# Patient Record
Sex: Female | Born: 1935 | Race: White | Hispanic: No
Health system: Southern US, Community
[De-identification: ages and names within clinical notes are randomized; demographics above are authoritative.]

## PROBLEM LIST (undated history)

## (undated) DIAGNOSIS — I639 Cerebral infarction, unspecified: Secondary | ICD-10-CM

## (undated) DIAGNOSIS — I2510 Atherosclerotic heart disease of native coronary artery without angina pectoris: Secondary | ICD-10-CM

## (undated) DIAGNOSIS — Z951 Presence of aortocoronary bypass graft: Secondary | ICD-10-CM

## (undated) DIAGNOSIS — K219 Gastro-esophageal reflux disease without esophagitis: Secondary | ICD-10-CM

## (undated) DIAGNOSIS — I701 Atherosclerosis of renal artery: Secondary | ICD-10-CM

## (undated) DIAGNOSIS — I739 Peripheral vascular disease, unspecified: Secondary | ICD-10-CM

## (undated) DIAGNOSIS — I6529 Occlusion and stenosis of unspecified carotid artery: Secondary | ICD-10-CM

## (undated) DIAGNOSIS — I10 Essential (primary) hypertension: Secondary | ICD-10-CM

## (undated) DIAGNOSIS — E119 Type 2 diabetes mellitus without complications: Secondary | ICD-10-CM

## (undated) DIAGNOSIS — C55 Malignant neoplasm of uterus, part unspecified: Secondary | ICD-10-CM

## (undated) DIAGNOSIS — M359 Systemic involvement of connective tissue, unspecified: Secondary | ICD-10-CM

## (undated) DIAGNOSIS — M810 Age-related osteoporosis without current pathological fracture: Secondary | ICD-10-CM

## (undated) DIAGNOSIS — J189 Pneumonia, unspecified organism: Secondary | ICD-10-CM

## (undated) DIAGNOSIS — I219 Acute myocardial infarction, unspecified: Secondary | ICD-10-CM

## (undated) HISTORY — PX: ABDOMINAL HYSTERECTOMY: SHX81

## (undated) HISTORY — PX: CAROTID ENDARTERECTOMY: SUR193

## (undated) HISTORY — PX: OTHER SURGICAL HISTORY: SHX169

## (undated) HISTORY — PX: CORONARY ARTERY BYPASS GRAFT: SHX141

---

## 2003-06-14 DIAGNOSIS — Z951 Presence of aortocoronary bypass graft: Secondary | ICD-10-CM

## 2003-06-14 HISTORY — DX: Presence of aortocoronary bypass graft: Z95.1

## 2005-03-22 ENCOUNTER — Emergency Department: Payer: Self-pay | Admitting: Unknown Physician Specialty

## 2005-06-22 ENCOUNTER — Ambulatory Visit: Payer: Self-pay | Admitting: Internal Medicine

## 2005-10-24 ENCOUNTER — Ambulatory Visit: Payer: Self-pay | Admitting: *Deleted

## 2005-11-07 ENCOUNTER — Ambulatory Visit: Payer: Self-pay | Admitting: *Deleted

## 2005-11-16 ENCOUNTER — Ambulatory Visit: Payer: Self-pay | Admitting: *Deleted

## 2005-11-17 ENCOUNTER — Ambulatory Visit: Payer: Self-pay | Admitting: *Deleted

## 2006-02-10 ENCOUNTER — Ambulatory Visit: Payer: Self-pay | Admitting: *Deleted

## 2006-03-08 ENCOUNTER — Ambulatory Visit (HOSPITAL_COMMUNITY): Admission: RE | Admit: 2006-03-08 | Discharge: 2006-03-08 | Payer: Self-pay | Admitting: *Deleted

## 2008-10-12 ENCOUNTER — Emergency Department: Payer: Self-pay | Admitting: Internal Medicine

## 2009-07-04 ENCOUNTER — Emergency Department: Payer: Self-pay | Admitting: Emergency Medicine

## 2010-12-20 ENCOUNTER — Ambulatory Visit: Payer: Self-pay | Admitting: Otolaryngology

## 2011-08-31 ENCOUNTER — Ambulatory Visit: Payer: Self-pay | Admitting: Internal Medicine

## 2012-12-18 ENCOUNTER — Ambulatory Visit: Payer: Self-pay | Admitting: Vascular Surgery

## 2013-02-06 ENCOUNTER — Ambulatory Visit: Payer: Self-pay | Admitting: Vascular Surgery

## 2013-02-06 LAB — BASIC METABOLIC PANEL
Anion Gap: 5 — ABNORMAL LOW (ref 7–16)
Chloride: 107 mmol/L (ref 98–107)
Co2: 27 mmol/L (ref 21–32)
EGFR (African American): >60
Glucose: 166 mg/dL — ABNORMAL HIGH (ref 65–99)
Potassium: 4.9 mmol/L (ref 3.5–5.1)
Sodium: 139 mmol/L (ref 136–145)

## 2013-02-06 LAB — CBC
HCT: 33.4 % — ABNORMAL LOW (ref 35.0–47.0)
HGB: 11.3 g/dL — ABNORMAL LOW (ref 12.0–16.0)
MCH: 29.3 pg (ref 26.0–34.0)
MCHC: 33.9 g/dL (ref 32.0–36.0)
MCV: 86 fL (ref 80–100)
Platelet: 134 10*3/uL — ABNORMAL LOW (ref 150–440)
RBC: 3.87 10*6/uL (ref 3.80–5.20)
RDW: 14.3 % (ref 11.5–14.5)
WBC: 3.2 10*3/uL — ABNORMAL LOW (ref 3.6–11.0)

## 2013-02-15 ENCOUNTER — Inpatient Hospital Stay: Payer: Self-pay | Admitting: Vascular Surgery

## 2013-02-16 LAB — CBC WITH DIFFERENTIAL/PLATELET
Eosinophil #: 0 10*3/uL (ref 0.0–0.7)
HCT: 29.6 % — ABNORMAL LOW (ref 35.0–47.0)
HGB: 10.1 g/dL — ABNORMAL LOW (ref 12.0–16.0)
Lymphocyte %: 4.2 %
Monocyte %: 5.7 %
Neutrophil #: 5.8 10*3/uL (ref 1.4–6.5)
Neutrophil %: 89.4 %
Platelet: 128 10*3/uL — ABNORMAL LOW (ref 150–440)
RDW: 13.8 % (ref 11.5–14.5)

## 2013-02-16 LAB — PROTIME-INR: INR: 1.1

## 2013-02-16 LAB — BASIC METABOLIC PANEL
Anion Gap: 6 — ABNORMAL LOW (ref 7–16)
Calcium, Total: 8.1 mg/dL — ABNORMAL LOW (ref 8.5–10.1)
Chloride: 112 mmol/L — ABNORMAL HIGH (ref 98–107)
Co2: 23 mmol/L (ref 21–32)
EGFR (African American): >60
EGFR (Non-African Amer.): >60
Glucose: 132 mg/dL — ABNORMAL HIGH (ref 65–99)
Osmolality: 285 (ref 275–301)
Potassium: 4.9 mmol/L (ref 3.5–5.1)
Sodium: 141 mmol/L (ref 136–145)

## 2013-02-16 LAB — APTT: Activated PTT: 27.5 secs (ref 23.6–35.9)

## 2013-05-23 ENCOUNTER — Emergency Department: Payer: Self-pay | Admitting: Emergency Medicine

## 2013-05-23 LAB — CBC
HGB: 12.4 g/dL (ref 12.0–16.0)
MCH: 28.1 pg (ref 26.0–34.0)
MCHC: 32 g/dL (ref 32.0–36.0)
Platelet: 136 10*3/uL — ABNORMAL LOW (ref 150–440)

## 2013-05-23 LAB — COMPREHENSIVE METABOLIC PANEL
Albumin: 3.4 g/dL (ref 3.4–5.0)
BUN: 22 mg/dL — ABNORMAL HIGH (ref 7–18)
Chloride: 106 mmol/L (ref 98–107)
Co2: 30 mmol/L (ref 21–32)
EGFR (African American): >60
EGFR (Non-African Amer.): 60 — ABNORMAL LOW
Osmolality: 289 (ref 275–301)
SGOT(AST): 29 U/L (ref 15–37)

## 2013-11-18 ENCOUNTER — Emergency Department: Payer: Self-pay | Admitting: Emergency Medicine

## 2014-08-12 ENCOUNTER — Inpatient Hospital Stay: Payer: Self-pay | Admitting: Internal Medicine

## 2014-10-03 NOTE — Op Note (Signed)
PATIENT NAME:  Yolanda Turner, Yolanda Turner MR#:  [id number] DATE OF BIRTH:  30-Nov-1925  DATE OF PROCEDURE:  02/15/2013  PREOPERATIVE DIAGNOSIS: Critical stenosis of the left internal carotid artery.   POSTOPERATIVE DIAGNOSIS: Critical stenosis of the left  internal carotid artery.   PROCEDURE PERFORMED:  1.  Left carotid endarterectomy with CorMatrix patch angioplasty.  2.  Repair of arterial defect with Xenograft CorMatrix patch.  3.  Insertion of arterial line with ultrasound guidance.   SURGEON: Katha Cabal, M.D.   FIRST ASSISTANT: Ms. Melvyn Neth.   ANESTHESIA: General by endotracheal intubation.   FLUIDS: Per anesthesia record.   ESTIMATED BLOOD LOSS: 100 mL.   SPECIMEN: Carotid plaque to pathology for permanent section.   INDICATIONS: Ms. Strom is an 81 year old woman who was found to have critical stenosis of the left internal carotid artery. Anatomy was more suitable for surgical intervention, and the patient did well with her preoperative cardiac evaluation. She is therefore scheduled for endarterectomy. The risks, benefits, as well as alternative therapies were reviewed. All questions were answered. The patient agrees to proceed.   DESCRIPTION OF PROCEDURE: The patient is taken to the Operating Room and placed in the supine position. After adequate general anesthesia is induced, multiple attempts by anesthesia at accessing the radial artery are unsuccessful and I am asked to achieve radial artery catheterization for monitoring.   Ultrasound is placed in a sterile sleeve. The left radial artery is identified. It is echolucent and pulsatile indicating patency. Image is recorded for the permanent record and under direct ultrasound visualization, microneedle is inserted, microwire followed by micro sheath. Micro sheath is removed and an Arrow 20-gauge arterial catheter is inserted over the wire. The pressure monitor is then hooked up. The catheter is secured and a sterile dressing is  applied.   The patient is then positioned with her neck extended, slightly rotated to the right, and the left neck and chest wall are prepped and draped in sterile fashion. Appropriate timeout is called.   A linear incision is then created along the anterior margin of the sternocleidomastoid muscle and carried down through the soft tissues. Platysma is transected with Bovie cautery. External jugular vein is ligated between 2-0 silk ties. Dissection is carried down to expose the omohyoid, which is reflected inferiorly and the common carotid artery is identified. The vagus nerve is noted and left undisturbed. Dissection is then carried in a craniad direction and at the bifurcation of facial vein is ligated between 2-0 silk ties. External carotid artery and superior thyroid are looped with Silastic vessel loops. Internal carotid artery is dissected to a level above visible plaque formation.   7000 units of heparin is given and allowed to circulate for 5 minutes. The common, followed by the external, followed by the internal carotid artery is clamped. Arteriotomy is then made, extended with Potts scissors, and an indwelling Sundt shunt is then placed.   Flow is then re-established to the brain.   There is redundancy of the internal carotid artery with two 90 degree kinks and therefore, approximately 1 cm of the internal carotid artery is imbricated with interrupted 7-0 Prolene after endarterectomy has been performed. Endarterectomy is performed under direct visualization in the common bulb and internal carotid artery. The external carotid artery is treated with eversion technique.   CorMatrix patch is rehydrated on the back table, beveled and then applied to repair the arterial defect using running 6-0 Prolene with a 4-quadrant technique.   Copious irrigation is then performed.  The shunt is removed and the suture line is completed. Flow is then re-established first to the external carotid artery and then  the internal carotid artery to prevent distal embolization.   The wound is then inspected for hemostasis and subsequently, Evicyl and Surgicel are placed in the bed of the wound. The wound is then closed, reapproximating the platysma with 3-0 Vicryl followed by closure of the skin with 4-0 Monocryl, subcuticular and then Dermabond.   The patient tolerated the procedure well. There were no immediate complications. Sponge and needle counts are correct and she is awakened in the Operating Room, moving all extremities and obeying simple commands. She is taken to the recovery area in stable condition.    ____________________________ Katha Cabal, MD ggs:jm D: 02/16/2013 16:10:00 ET T: 02/16/2013 19:56:21 ET JOB#: [id number]  cc: Katha Cabal, MD, <Dictator> Leona Carry. Hall Busing, MD Katha Cabal MD ELECTRONICALLY SIGNED 02/22/2013 9:12

## 2014-10-12 NOTE — H&P (Signed)
PATIENT NAME:  Yolanda Turner, Yolanda Turner MR#:  [id number] DATE OF BIRTH:  07-Jun-1926  DATE OF ADMISSION:  08/12/2014  PRIMARY CARE PROVIDER: Leona Carry. Hall Busing, MD  EMERGENCY DEPARTMENT REFERRING PHYSICIAN: Sheryl L. Benjaman Lobe, MD   CHIEF COMPLAINT: Slurred speech, some confusion.   HISTORY OF PRESENT ILLNESS: The patient is an 81 year old white female with history of coronary artery disease, borderline diabetes, peripheral vascular disease, hyperlipidemia, who was in her usual state of health earlier today.  Was noted to have right-sided slurred speech and her whole right side of the face got drawn up. It lasted for a few minutes, and then subsequently the patient had some confusion. Her family is at the bedside. The patient was saying things that did not make sense, but she knew that they were not right. She otherwise denies any weakness in her extremities. Denies any numbness or tingling.   PAST MEDICAL HISTORY: Significant for: 1.  Coronary artery disease, status post CABG.  2.  Peripheral vascular disease. 3.   PVD post left-sided carotid endarterectomy.  4.  Hyperlipidemia.  5.  Diabetes type 2.  6.  Hypertension.  ALLERGIES: PENICILLIN.   SOCIAL HISTORY: History of smoking, quit in her 53s. No alcohol. Lives with her family.   MEDICATIONS AT HOME: 1.  Toprol-XL 25 p.o. daily. 2.  Zofran 4 mg q. 8 p.r.n. 3.  Neurontin 600 mg at bedtime. 4.  Lisinopril 5 p.o. daily. 5.  Fentanyl 12 mcg 1 patch every 3 days.  6.  Aspirin 81, 1 tab p.o. daily.   FAMILY HISTORY: Positive for hypertension.   REVIEW OF SYSTEMS:  CONSTITUTIONAL: Denies any fevers, chills. No weight loss. No weight gain.  EYES:   Denies any blurred or double vision. No pain. No redness. No inflammation.  EARS, NOSE, THROAT: No tinnitus. Has chronic hearing loss. No difficulty swallowing.  RESPIRATORY: Denies any cough, wheezing, hemoptysis. No COPD. CARDIOVASCULAR: Denies any chest pain, orthopnea, or edema.  GASTROINTESTINAL: No  nausea, vomiting, diarrhea. No abdominal pain. No hematemesis.  GENITOURINARY: Denies any dysuria, hematuria, renal calculus. ENDOCRINE:  Denies any polyuria or nocturia, or thyroid problems. HEMATOLOGIC AND LYMPHATIC: Denies anemia, easy bruisability, bleeding.  SKIN: No acne. No rash.  NEUROLOGIC:  No numbness. No previous history of CVA.  PSYCHIATRIC: Denies any anxiety, insomnia.   PHYSICAL EXAMINATION: VITAL SIGNS: Temperature 98.3, pulse 77, respirations 14, blood pressure 185/65.  GENERAL: The patient is a thin female in no acute distress.  HEENT: Head atraumatic, normocephalic. Pupils equally round, reactive to light and accommodation. There is no conjunctival pallor. No sclerae icterus. Nasal exam shows no drainage. External ear exam shows no erythema or drainage.  NECK: Supple without any thyromegaly. No carotid bruits.  CARDIOVASCULAR: Regular rate and rhythm. No murmurs, rubs, clicks, or gallops.  LUNGS: Clear to auscultation bilaterally without any rales, rhonchi, wheezing.  ABDOMEN: Soft, nontender, nondistended. Positive bowel sounds x 4.  EXTREMITIES: No clubbing, cyanosis, or edema.  SKIN: No rash.  LYMPH NODES: Nonpalpable.  MUSCULOSKELETAL: There is no erythema or swelling.  VASCULAR: Good DP, PT pulses.  PSYCHIATRIC: Not anxious or depressed.  NEUROLOGIC: Awake, alert, and oriented x 3. No focal deficits.  EVALUATIONS: CT scan of the head without contrast showed old ischemic changes in the left basal ganglia felt to be due to old lacunar infarct. Chest x-ray, no active cardiopulmonary disease.  Glucose 98, BUN 23, creatinine 0.83, sodium 144, potassium 5.0, chloride 109, CO2 of 32, calcium 9.1. LFTs are normal, except slightly elevated AST.  WBC 3.6, hemoglobin 12.9, platelet count 171,000.   ASSESSMENT AND PLAN: The patient is an 81 year old white female with peripheral vascular disease, carotid stenosis, coronary artery disease, hypertension, presents with slurred  speech and some confusion.  1.  Slurred speech, which is now resolved, but some intermittent confusion and possible cerebrovascular accident. At this time, we will go ahead and admit her.  Was on aspirin, changed her to Aggrenox. We will get an MRI of the brain, echocardiogram and carotid Dopplers.  2.  Hypertension. We will continue her on metoprolol.  3.  Hyperlipidemia. Continue simvastatin as taking at home. Check a fasting lipid panel in the a.m.  4.  Neuropathy. Continue Neurontin.  5.  Miscellaneous. The patient will be on Lovenox for deep vein thrombosis prophylaxis.   TIME SPENT ON THIS PATIENT: 50 minutes.   ____________________________ Lafonda Mosses Posey Pronto, MD shp:LT D: 08/12/2014 18:37:28 ET T: 08/12/2014 19:24:10 ET JOB#: [id number]  cc: Exander Shaul H. Posey Pronto, MD, <Dictator> Alric Seton MD ELECTRONICALLY SIGNED 08/15/2014 15:23

## 2014-10-12 NOTE — Consult Note (Signed)
Referring Physician:  Alric Seton   Primary Care Physician:  Alric Seton : [place], [address] [place], [place] [address], [place] [phone]  Reason for Consult: Admit Date: 12-Aug-2014  Chief Complaint: confusion  Reason for Consult: CVA   History of Present Illness: History of Present Illness:   seen at request of Dr. Manuella Ghazi due to stroke;  81 yo RHD F presents to [facility] secondary to difficulty getting her words out and an mild R facial droop.  Today, symptoms have completely resolved.  Pt has never had this before.  Pt feels good now.  Pt does report palpatations in past but none recently.  ROS:  General denies complaints   HEENT no complaints   Lungs no complaints   Cardiac no complaints   GI no complaints   GU no complaints   Musculoskeletal no complaints   Extremities no complaints   Skin no complaints   Neuro difficulty with words   Endocrine no complaints   Psych no complaints   Past Medical/Surgical Hx:  HTN:   CAD:   Diabetes:   CABG (Coronary Artery Bypass Graft):   Past Medical/ Surgical Hx:  Past Medical History reviewed by me as above   Past Surgical History CABG   Home Medications: Medication Instructions Last Modified Date/Time  lisinopril 5 mg oral tablet 1 tab(s) orally once a day (at bedtime) 01-Mar-16 18:21  gabapentin 300 mg oral capsule 3 cap(s) orally once a day (at bedtime) 01-Mar-16 18:21  metoprolol succinate 50 mg oral tablet, extended release 0.5 tab(s) orally once a day (at bedtime) 01-Mar-16 18:21  simvastatin 40 mg oral tablet 1 tab(s) orally once a day (at bedtime) 01-Mar-16 18:21   Allergies:  PCN: Itching  Allergies:  Allergies NKDA   Social/Family History: Employment Status: retired  Lives With: spouse  Living Arrangements: house  Social History: no tob, no EtOH, no illicits  Family History: no seizures, no strokes   Vital Signs: **Vital Signs.:   02-Mar-16 11:59   Vital Signs Type Routine  Temperature Temperature (F) 98.1  Celsius 36.7  Temperature Source oral  Pulse Pulse 71  Respirations Respirations 18  Systolic BP Systolic BP 681  Diastolic BP (mmHg) Diastolic BP (mmHg) 64  Mean BP 88  Pulse Ox % Pulse Ox % 97  Pulse Ox Activity Level  At rest  Oxygen Delivery Room Air/ 21 %   Physical Exam: General: very thin, NAD  HEENT: normocephalic, sclera nonicteric, oropharynx clear  Neck: supple, no JVD, no bruits  Chest: CTAB, no wheezing  Cardiac: RRR, no murmurs, no edema, 2+ pulses  Extremities: no C/C/E, FROM   Neurologic Exam: Mental Status: alert and oriented x 3, normal speech, very mild aphasia, follows complex commands  Cranial Nerves: PERRLA, EOMI, nl VF, face symmetric, tongue midline, shoulder shrug equal  Motor Exam: mild R UE drift otherwise 5/ 5 B, nl tone  Deep Tendon Reflexes: 1+/4 B, mute plantars  Sensory Exam: pinprick, temperature, and vibration intact B  Coordination: FTN and HTS WNL, nl RAM, nl gait   Lab Results: Thyroid:  02-Mar-16 05:12   Thyroid Stimulating Hormone  5.87 (0.45-4.50 (IU = International Unit)  ----------------------- Pregnant patients have  different reference  ranges for TSH:  - - - - - - - - - -  Pregnant, first trimetser:  0.36 - 2.50 uIU/mL)  LabObservation:  02-Mar-16 10:11   OBSERVATION Reason for Test  Hepatic:  01-Mar-16 16:33   Bilirubin,  Total 0.4  Alkaline Phosphatase 60  SGPT (ALT) 19  SGOT (AST)  38  Total Protein, Serum 6.7  Albumin, Serum 3.7  Routine Chem:  01-Mar-16 16:33   Result Comment PT/INR - SAMPLE QUANTITY NOT SUFFICIENT FOR  - TESTING. CALLED TO [facility] [place] 08/12/2014. TFK  Result(s) reported on 12 Aug 2014 at 05:23PM.  02-Mar-16 05:12   Cholesterol, Serum 110  Triglycerides, Serum 55  HDL (INHOUSE) 59  VLDL Cholesterol Calculated 11  LDL Cholesterol Calculated 40 (Result(s) reported on 13 Aug 2014 at 06:16AM.)  Glucose, Serum 92   BUN  24  Creatinine (comp) 0.79  Sodium, Serum 142  Potassium, Serum 4.1  Chloride, Serum  109  CO2, Serum 28  Calcium (Total), Serum 8.9  Anion Gap  5  Osmolality (calc) 287  eGFR (African American) >60  eGFR (Non-African American) >60 (eGFR values <2m/min/1.73 m2 may be an indication of chronic kidney disease (CKD). Calculated eGFR, using the MRDR Study equation, is useful in  patients with stable renal function. The eGFR calculation will not be reliable in acutely ill patients when serum creatinine is changing rapidly. It is not useful in patients on dialysis. The eGFR calculation may not be applicable to patients at the low and high extremes of body sizes, pregnant women, and vegetarians.)  Cardiac:  01-Mar-16 16:33   Troponin I < 0.02 (0.00-0.05 0.05 ng/mL or less: NEGATIVE  Repeat testing in 3-6 hrs  if clinically indicated. >0.05 ng/mL: POTENTIAL  MYOCARDIAL INJURY. Repeat  testing in 3-6 hrs if  clinically indicated. NOTE: An increase or decrease  of 30% or more on serial  testing suggests a  clinically important change)  Routine UA:  01-Mar-16 16:33   Color (UA) Yellow  Clarity (UA) Clear  Glucose (UA) Negative  Bilirubin (UA) Negative  Ketones (UA) Negative  Specific Gravity (UA) 1.010  Blood (UA) Negative  pH (UA) 6.0  Protein (UA) Negative  Nitrite (UA) Negative  Leukocyte Esterase (UA) Negative (Result(s) reported on 12 Aug 2014 at 06:45PM.)  RBC (UA) NONE SEEN  WBC (UA) 2 /HPF  Bacteria (UA) NONE SEEN  Epithelial Cells (UA) <1 /HPF (Result(s) reported on 12 Aug 2014 at 06:45PM.)  Routine Coag:  01-Mar-16 16:33   Prothrombin 13.3 (11.4-15.0 NOTE: New Reference Range  07/11/14)  INR 1.0 (INR reference interval applies to patients on anticoagulant therapy. A single INR therapeutic range for coumarins is not optimal for all indications; however, the suggested range for most indications is 2.0 - 3.0. Exceptions to the INR Reference Range may  include: Prosthetic heart valves, acute myocardial infarction, prevention of myocardial infarction, and combinations of aspirin and anticoagulant. The need for a higher or lower target INR must be assessed individually. Reference: The Pharmacology and Management of the Vitamin K  antagonists: the seventh ACCP Conference on Antithrombotic and Thrombolytic Therapy. CKCMKL.4917Sept:126 (3suppl): [id number] A HCT value >55% may artifactually increase the PT.  In one study,  the increase was an average of 25%. Reference:  "Effect on Routine and Special Coagulation Testing Values of Citrate Anticoagulant Adjustment in Patients with High HCT Values." American Journal of Clinical Pathology 2006;126:400-405.)  Routine Hem:  01-Mar-16 16:33   WBC (CBC) 3.6  RBC (CBC) 4.62  Hemoglobin (CBC) 12.9  Hematocrit (CBC) 40.4  Platelet Count (CBC) 171  MCV 87  MCH 27.9  MCHC  31.9  RDW 13.9  Neutrophil % 72.0  Lymphocyte % 17.0  Monocyte % 7.7  Eosinophil % 2.0  Basophil % 1.3  Neutrophil # 2.6  Lymphocyte #  0.6  Monocyte # 0.3  Eosinophil # 0.1  Basophil # 0.0 (Result(s) reported on 12 Aug 2014 at 05:09PM.)   Radiology Results: [place]:    02-Mar-16 11:27, US Carotid Doppler Bilateral  US Carotid Doppler Bilateral   REASON FOR EXAM:    cva  COMMENTS:       PROCEDURE: [place]  - US CAROTID DOPPLER BILATERAL  - Aug 13 2014 11:27AM     CLINICAL DATA:  Small acute left MCA territory infarct    EXAM:  BILATERAL CAROTID DUPLEX ULTRASOUND    TECHNIQUE:  Pearline Cables scale imaging, color Doppler and duplex ultrasound were  performed of bilateral carotid and vertebral arteries in the neck.    COMPARISON:  08/13/2014  FINDINGS:  Criteria: Quantification of carotid stenosis is based on velocity  parameters that correlate the residual internal carotid diameter  with NASCET-based stenosis levels, using the diameter of the distal  internal carotid lumen as the denominator for stenosis measurement.    The  following velocity measurements were obtained:    RIGHT    ICA:  255/46 cm/sec    CCA:  54/49 cm/sec    SYSTOLIC ICA/CCA RATIO:  4.7  DIASTOLIC ICA/CCA RATIO:  4.4    ECA:  105 cm/sec    LEFT    ICA:  125/21 cm/sec    CCA:  20/1 cm/sec    SYSTOLIC ICA/CCA RATIO:  1.9    DIASTOLIC ICA/CCA RATIO:  2.3    ECA:  145 cm/sec  RIGHT CAROTID ARTERY: Severe heterogeneous echogenic and and  shadowing plaque formation in the right carotid bifurcation  extending into the proximal ICA. There is significant luminal  narrowing by grayscale imaging. In this region there is velocity  elevation measuring 255/46 centimeters/second with some turbulent  flow. Degree of stenosis estimated greater than 70% by ultrasound  criteria.    RIGHT VERTEBRAL ARTERY:  Antegrade    LEFT CAROTID ARTERY: Moderate heterogeneous atherosclerosis and  plaqueformation. No hemodynamically significant left ICA stenosis,  velocity elevation, or turbulent flow.    LEFT VERTEBRAL ARTERY:  Antegrade   IMPRESSION:  Right greater the left carotid atherosclerosis.    Moderate right ICA stenosis estimated at greater than 70%.    Left ICA narrowing less than 50%.      Electronically Signed    By: Jerilynn Mages.  Shick M.D.    On: 08/13/2014 13:28         Verified By: Earl Gala, M.D.,  MRI:    61-Mar-16 09:55, MRI Brain Without Contrast  MRI Brain Without Contrast   REASON FOR EXAM:    CVA  COMMENTS:       PROCEDURE: MR  - MR BRAIN WO CONTRAST  - Aug 13 2014  9:55AM     CLINICAL DATA:  Stroke. Episode of left facial droop and aphasia  yesterday. Symptoms now resolved.    EXAM:  MRI HEAD WITHOUT CONTRAST    TECHNIQUE:  Multiplanar, multiecho pulse sequences of the brain and surrounding  structures were obtained without intravenous contrast.  COMPARISON:  Head CT 08/12/2014    FINDINGS:  There are several punctate foci of acute cortical infarction  involving the posterior left insula and left frontal  operculum.  Small, patchy areas of acute infarction are also present involving  cortex and subcortical white matter more posteriorly and superiorly  in the left MCA territory involving the parietal lobe.    Nointracranial hemorrhage,  mass, midline shift, or extra-axial  fluid collection is identified. Chronic infarcts are again seen in  the left occipital lobe and right frontal lobe, and a chronic  lacunar infarct is again noted in the left lentiform nucleus. Patchy  T2 hyperintensities in the subcortical and deep cerebral white  matter elsewhere and in the pons are nonspecific but compatible with  mild to moderate chronic small vessel ischemic disease. There is  mild generalized cerebral atrophy. There is also a tiny, chronic  left cerebellar infarct.    Orbits are unremarkable. Mild left maxillary sinus and left ethmoid  air cell mucosal thickening is noted. Mastoid air cells are clear.  Major intracranial vascular flow voids are preserved.     IMPRESSION:  1. Small, acute left MCA territory infarcts with greatest  involvement of the left parietal lobe.  2. Chronic infarcts in the right frontal lobe, left occipital lobe,  left basal ganglia, and left cerebellum.  3. Mild-to-moderate chronic small vessel ischemic disease.  Electronically Signed    By: Logan Bores    On: 08/13/2014 10:04         Verified By: Ferol Luz, M.D.,  CT:    01-Mar-16 15:35, CT Head Without Contrast  CT Head Without Contrast   REASON FOR EXAM:    slurred speech  COMMENTS:       PROCEDURE: CT  - CT HEAD WITHOUT CONTRAST  - Aug 12 2014  3:35PM     CLINICAL DATA:  Left-sided weakness and slurred speech.    EXAM:  CT HEAD WITHOUT CONTRAST    TECHNIQUE:  Contiguous axial imageswere obtained from the base of the skull  through the vertex without contrast.    COMPARISON:  Neck CTA 12/18/2012  FINDINGS:  There is chronic encephalomalacia in the left occipital lobe.  Encephalomalacia along the  inferior right frontal lobe. Old lacune  in the left basal ganglia. No evidence for acute hemorrhage, mass  lesion, midline shift or hydrocephalus. There is chronic  opacification in the posterior left ethmoid air cells. No acute bone  abnormality.     IMPRESSION:  Negative for an acute hemorrhage.    Old ischemic changes as described. If there is concern for an acute  ischemic event, recommend further characterization with MRI.    Electronically Signed    By: Markus Daft M.D.    On: 08/12/2014 16:26         Verified By: Burman Riis, M.D.,   Radiology Impression: Radiology Impression: MRI personally reviewed by me and shows mild L MCA infarct, moderate white matter changes   Impression/Recommendations: Recommendations:   prior notes reviewed by me reviewed by me   Small L MCA infarct-  etiology is likely cardioembolic and this is mildly symptomatic Old infarct-  stable and could be cardioembolic too vs. small vessle disease R carotid stenosis-  asymptomatic continue ASA 52m daily x 3 months then stop start plavix 78mdaily no neurologic need for statin echo pending, if neg, pt will need event monitor for 60 days needs therapy will follow briefly  Electronic Signatures: SmJamison NeighborMD)  (Signed 02-Mar-16 14:30)  Authored: REFERRING PHYSICIAN, Primary Care Physician, Consult, History of Present Illness, Review of Systems, PAST MEDICAL/SURGICAL HISTORY, HOME MEDICATIONS, ALLERGIES, Social/Family History, NURSING VITAL SIGNS, Physical Exam-, LAB RESULTS, RADIOLOGY RESULTS, Recommendations   Last Updated: 02-Mar-16 14:30 by SmJamison NeighborMD)

## 2014-10-12 NOTE — Discharge Summary (Signed)
PATIENT NAME:  Yolanda Turner, Yolanda Turner MR#:  [id number] DATE OF BIRTH:  07-Aug-1925  DATE OF ADMISSION:  08/12/2014  DATE OF DISCHARGE:  08/14/2014  PRIMARY CARE PROVIDER: Leona Carry. Hall Busing, MD  PRESENTING COMPLAINT:  Confusion and slurred speech.  DISCHARGE DIAGNOSES:  1. Small left middle cerebral artery infarct.  2. Asymptomatic right carotid stenosis.  3. Hypertension.  4. Hyperlipidemia.  5. Peripheral neuropathy.  6. Chronic diastolic and systolic congestive heart failure with ejection fraction of 35% to 40%.  7. Coronary artery disease, status post CABG.  8. Peripheral vascular disease, status post left-sided carotid endarterectomy.  9. Diabetes mellitus type 2.  10. Hypothyroidism with elevated TSH at 5.87.  CONSULTATION: Mila Homer. Tamala Julian, MD, neurology.   PROCEDURES:  1. CT of the head without contrast, March 1, shows no acute hemorrhage, no ischemic changes.  2. MRI of the brain without contrast, March 2, shows a small acute left MCA territory infarct with greatest involvement of the parietal lobe, chronic infarct in the right frontal lobe, left occipital lobe, left basal ganglia, and left cerebellum. Mild to moderate chronic vessel ischemic disease.  3. Chest x-ray, March 1, no active disease.  4. Carotid ultrasounds showed a right-greater-than-left carotid atherosclerosis with moderate right ICA stenosis estimated at greater than 70% and left ICA narrowing less than 50%.  5. A 2-D echocardiogram, March 2, shows left ventricular ejection fraction is 35% to 40%. Moderate to severely decreased global left ventricular systolic function. Decreased left ventricle internal cavity size, mild mitral valve regurgitation, mild aortic valve regurgitation, mild to moderate tricuspid valve regurgitation, mildly increased left ventricular posterior wall thickness, dilated cardiomyopathy.   HISTORY OF PRESENT ILLNESS: This 81 year old woman with past medical history of coronary artery disease, borderline  diabetes, peripheral vascular disease and hyperlipidemia presents with slurred speech and right facial droop. Symptoms lasted for a few minutes and then had some subsequent confusion. Symptoms had largely resolved by the time of presentation to the Emergency Room.   HOSPITAL COURSE BY PROBLEM:  1. New acute left MCA infarct: The patient was followed by neurology throughout the hospitalization. MRI findings suggestive of new infarct, likely cardioembolic. A 2-D echocardiogram did not show a source. Right carotid stenosis, largely asymptomatic. She continues on aspirin 81 mg for 3 months and then will stop. She continues on Plavix 75 mg daily. She needs a repeat carotid ultrasound in 6 months. She will also need an event monitor for 60 days to look for possible paroxysmal atrial fibrillation. She will need to follow up with neurology in 3 months after discharge.  2. Hypertension: She continues on metoprolol. Blood pressure is well controlled.  3. Hyperlipidemia: She continues on simvastatin with no change to her dose.  4. Peripheral neuropathy. She continues on Neurontin.  5. Hypothyroidism: TSH was elevated at 5.87. This will need to be followed by her primary care physician and will need to be rechecked in 6 weeks to be sure that this is an accurate value.   DISCHARGE PHYSICAL EXAMINATION:  VITAL SIGNS: Temperature 98.2, pulse 73, respirations 20, blood pressure 108/63, oxygenation 96% on room air.  GENERAL: No acute distress.  CARDIOVASCULAR: Regular rate and rhythm. No murmurs, rubs, or gallops. No peripheral edema. Pulses are 2+.  RESPIRATORY: Lungs clear to auscultation bilaterally with good air movement.  NEUROLOGIC: Patient alert and oriented x 4 with good insight into her clinical conditions. Cranial nerves II through XII grossly intact. Strength and sensation are intact.   DIAGNOSTIC DATA: Sodium 142, potassium  4.1, chloride 109, bicarbonate 28, BUN 24, creatinine 0.79, glucose 92. LFTs  normal. Troponin less than 0.02. TSH elevated at 5.87. White blood cells 3.6, hemoglobin 12.9, platelets 171,000, MCV is 87. UA negative for signs of infection. Free T3 is normal at 2.7. Free T4 normal at 1.04.   DISCHARGE MEDICATIONS:  1. Lisinopril 5 mg 1 tablet daily.  2. Gabapentin 300 mg 3 tablets once a day.  3. Metoprolol succinate 50 mg 1/2 tablet once a day at bedtime.  4. Simvastatin 40 mg 1 tablet once a day.  5. Clopidogrel 75 mg 1 tablet once a day.  6. Aspirin 81 mg 1 tablet daily.   CONDITION ON DISCHARGE: Stable.   DISPOSITION: Being discharged home with home health, physical therapy, and nursing.   DISCHARGE INSTRUCTIONS:  DIET: Low-sodium, low-fat, low-cholesterol diet.   ACTIVITY: As tolerated.   TIME FRAME FOR FOLLOWUP: Please followup in 1 to 2 weeks with Dr. Nehemiah Massed, in 1 to 2 weeks with Dr. Hall Busing, and 4 to 6 weeks with [facility].   TIME SPENT ON DISCHARGE: 45 minutes.    ____________________________ Earleen Newport. Volanda Napoleon, MD cpw:ah D: 08/18/2014 09:55:00 ET T: 08/18/2014 14:28:11 ET JOB#: [id number]  cc: Barnetta Chapel P. Volanda Napoleon, MD, <Dictator>  Aldean Jewett MD ELECTRONICALLY SIGNED 08/23/2014 10:55

## 2014-12-10 ENCOUNTER — Other Ambulatory Visit: Payer: Self-pay | Admitting: Vascular Surgery

## 2014-12-10 DIAGNOSIS — I6523 Occlusion and stenosis of bilateral carotid arteries: Secondary | ICD-10-CM

## 2014-12-17 ENCOUNTER — Ambulatory Visit
Admission: RE | Admit: 2014-12-17 | Discharge: 2014-12-17 | Disposition: A | Discharge status: Home / Self Care | Payer: Medicare Other | Source: Ambulatory Visit | Attending: Vascular Surgery | Admitting: Vascular Surgery

## 2014-12-17 DIAGNOSIS — I6529 Occlusion and stenosis of unspecified carotid artery: Secondary | ICD-10-CM | POA: Diagnosis present

## 2014-12-17 DIAGNOSIS — I6523 Occlusion and stenosis of bilateral carotid arteries: Secondary | ICD-10-CM

## 2014-12-17 DIAGNOSIS — I6521 Occlusion and stenosis of right carotid artery: Secondary | ICD-10-CM | POA: Insufficient documentation

## 2014-12-17 HISTORY — DX: Systemic involvement of connective tissue, unspecified: M35.9

## 2014-12-17 HISTORY — DX: Type 2 diabetes mellitus without complications: E11.9

## 2014-12-17 MED ORDER — IOHEXOL 350 MG/ML SOLN
80.0000 mL | Freq: Once | INTRAVENOUS | Status: AC | PRN
  Administered 2014-12-17: 100 mL via INTRAVENOUS

## 2015-01-22 ENCOUNTER — Ambulatory Visit
Admission: RE | Admit: 2015-01-22 | Discharge: 2015-01-22 | Disposition: A | Discharge status: Home / Self Care | Payer: Medicare Other | Source: Ambulatory Visit | Attending: Vascular Surgery | Admitting: Vascular Surgery

## 2015-01-22 ENCOUNTER — Encounter
Admission: RE | Disposition: A | Discharge status: Home / Self Care | Payer: Self-pay | Source: Ambulatory Visit | Attending: Vascular Surgery

## 2015-01-22 ENCOUNTER — Encounter: Payer: Self-pay | Admitting: *Deleted

## 2015-01-22 DIAGNOSIS — I499 Cardiac arrhythmia, unspecified: Secondary | ICD-10-CM | POA: Diagnosis not present

## 2015-01-22 DIAGNOSIS — I701 Atherosclerosis of renal artery: Secondary | ICD-10-CM | POA: Diagnosis not present

## 2015-01-22 DIAGNOSIS — Z859 Personal history of malignant neoplasm, unspecified: Secondary | ICD-10-CM | POA: Diagnosis not present

## 2015-01-22 DIAGNOSIS — I6529 Occlusion and stenosis of unspecified carotid artery: Secondary | ICD-10-CM | POA: Diagnosis not present

## 2015-01-22 DIAGNOSIS — I6521 Occlusion and stenosis of right carotid artery: Secondary | ICD-10-CM | POA: Insufficient documentation

## 2015-01-22 DIAGNOSIS — E780 Pure hypercholesterolemia: Secondary | ICD-10-CM | POA: Diagnosis not present

## 2015-01-22 DIAGNOSIS — Z79899 Other long term (current) drug therapy: Secondary | ICD-10-CM | POA: Insufficient documentation

## 2015-01-22 DIAGNOSIS — Z87891 Personal history of nicotine dependence: Secondary | ICD-10-CM | POA: Diagnosis not present

## 2015-01-22 DIAGNOSIS — Z7982 Long term (current) use of aspirin: Secondary | ICD-10-CM | POA: Insufficient documentation

## 2015-01-22 DIAGNOSIS — Z7902 Long term (current) use of antithrombotics/antiplatelets: Secondary | ICD-10-CM | POA: Diagnosis not present

## 2015-01-22 DIAGNOSIS — E119 Type 2 diabetes mellitus without complications: Secondary | ICD-10-CM | POA: Insufficient documentation

## 2015-01-22 DIAGNOSIS — I739 Peripheral vascular disease, unspecified: Secondary | ICD-10-CM | POA: Diagnosis not present

## 2015-01-22 DIAGNOSIS — I252 Old myocardial infarction: Secondary | ICD-10-CM | POA: Insufficient documentation

## 2015-01-22 HISTORY — DX: Atherosclerosis of renal artery: I70.1

## 2015-01-22 HISTORY — DX: Malignant neoplasm of uterus, part unspecified: C55

## 2015-01-22 HISTORY — DX: Atherosclerotic heart disease of native coronary artery without angina pectoris: I25.10

## 2015-01-22 HISTORY — PX: PERIPHERAL VASCULAR CATHETERIZATION: SHX172C

## 2015-01-22 HISTORY — DX: Acute myocardial infarction, unspecified: I21.9

## 2015-01-22 HISTORY — DX: Peripheral vascular disease, unspecified: I73.9

## 2015-01-22 HISTORY — DX: Occlusion and stenosis of unspecified carotid artery: I65.29

## 2015-01-22 LAB — BASIC METABOLIC PANEL
Anion gap: 8 (ref 5–15)
BUN: 20 mg/dL (ref 6–20)
CALCIUM: 9.5 mg/dL (ref 8.9–10.3)
CO2: 30 mmol/L (ref 22–32)
Chloride: 104 mmol/L (ref 101–111)
Creatinine, Ser: 0.75 mg/dL (ref 0.44–1.00)
GFR calc Af Amer: >60 mL/min (ref >60)
GLUCOSE: 110 mg/dL — AB (ref 65–99)
Potassium: 4.1 mmol/L (ref 3.5–5.1)
Sodium: 142 mmol/L (ref 135–145)

## 2015-01-22 SURGERY — CAROTID PTA/STENT INTERVENTION
Anesthesia: Moderate Sedation | Laterality: Right

## 2015-01-22 MED ORDER — HEPARIN (PORCINE) IN NACL 2-0.9 UNIT/ML-% IJ SOLN
INTRAMUSCULAR | Status: AC
  Filled 2015-01-22: qty 1000

## 2015-01-22 MED ORDER — LIDOCAINE HCL (PF) 1 % IJ SOLN
INTRAMUSCULAR | Status: AC
  Filled 2015-01-22: qty 10

## 2015-01-22 MED ORDER — HYDRALAZINE HCL 20 MG/ML IJ SOLN: 5.0000 mg | INTRAMUSCULAR | Status: DC | PRN

## 2015-01-22 MED ORDER — ONDANSETRON 4 MG PO TBDP
ORAL_TABLET | ORAL | Status: AC
  Administered 2015-01-22: 4 mg via ORAL
  Filled 2015-01-22: qty 1

## 2015-01-22 MED ORDER — DEXTROSE 50 % IV SOLN: 0.5000 | Freq: Once | INTRAVENOUS | Status: DC | PRN

## 2015-01-22 MED ORDER — IOHEXOL 300 MG/ML  SOLN
INTRAMUSCULAR | Status: DC | PRN
  Administered 2015-01-22: 40 mL via INTRA_ARTERIAL

## 2015-01-22 MED ORDER — HEPARIN SODIUM (PORCINE) 1000 UNIT/ML IJ SOLN
INTRAMUSCULAR | Status: DC | PRN
  Administered 2015-01-22: 3000 [IU] via INTRAVENOUS

## 2015-01-22 MED ORDER — HEPARIN SODIUM (PORCINE) 1000 UNIT/ML IJ SOLN
INTRAMUSCULAR | Status: AC
  Filled 2015-01-22: qty 1

## 2015-01-22 MED ORDER — LABETALOL HCL 5 MG/ML IV SOLN: 10.0000 mg | INTRAVENOUS | Status: DC | PRN

## 2015-01-22 MED ORDER — CLINDAMYCIN PHOSPHATE 300 MG/50ML IV SOLN
INTRAVENOUS | Status: AC
  Filled 2015-01-22: qty 50

## 2015-01-22 MED ORDER — PHENYLEPHRINE HCL 10 MG/ML IJ SOLN
INTRAMUSCULAR | Status: AC
  Filled 2015-01-22: qty 1

## 2015-01-22 MED ORDER — MIDAZOLAM HCL 5 MG/5ML IJ SOLN
INTRAMUSCULAR | Status: AC
  Filled 2015-01-22: qty 5

## 2015-01-22 MED ORDER — DOPAMINE-DEXTROSE 3.2-5 MG/ML-% IV SOLN
INTRAVENOUS | Status: AC
  Filled 2015-01-22: qty 250

## 2015-01-22 MED ORDER — OXYCODONE HCL 5 MG PO TABS: 5.0000 mg | ORAL_TABLET | ORAL | Status: DC | PRN

## 2015-01-22 MED ORDER — ATROPINE SULFATE 0.1 MG/ML IJ SOLN: 0.5000 mg | Freq: Once | INTRAMUSCULAR | Status: DC | PRN

## 2015-01-22 MED ORDER — ACETAMINOPHEN 325 MG PO TABS: 325.0000 mg | ORAL_TABLET | ORAL | Status: DC | PRN

## 2015-01-22 MED ORDER — FENTANYL CITRATE (PF) 100 MCG/2ML IJ SOLN
INTRAMUSCULAR | Status: AC
  Filled 2015-01-22: qty 2

## 2015-01-22 MED ORDER — ONDANSETRON 4 MG PO TBDP: 4.0000 mg | ORAL_TABLET | Freq: Once | ORAL | Status: DC

## 2015-01-22 MED ORDER — FENTANYL CITRATE (PF) 100 MCG/2ML IJ SOLN
INTRAMUSCULAR | Status: DC | PRN
  Administered 2015-01-22: 25 ug via INTRAVENOUS

## 2015-01-22 MED ORDER — CLINDAMYCIN PHOSPHATE 300 MG/50ML IV SOLN
300.0000 mg | Freq: Once | INTRAVENOUS | Status: AC
  Administered 2015-01-22: 300 mg via INTRAVENOUS

## 2015-01-22 MED ORDER — HYDROMORPHONE HCL 1 MG/ML IJ SOLN: 1.0000 mg | INTRAMUSCULAR | Status: DC | PRN

## 2015-01-22 MED ORDER — SODIUM CHLORIDE 0.9 % IV SOLN
INTRAVENOUS | Status: DC
  Administered 2015-01-22: 08:00:00 via INTRAVENOUS

## 2015-01-22 MED ORDER — MIDAZOLAM HCL 2 MG/2ML IJ SOLN
INTRAMUSCULAR | Status: DC | PRN
  Administered 2015-01-22: 1 mg via INTRAVENOUS

## 2015-01-22 MED ORDER — ONDANSETRON 4 MG PO TBDP
4.0000 mg | ORAL_TABLET | Freq: Once | ORAL | Status: AC
  Administered 2015-01-22: 4 mg via ORAL

## 2015-01-22 MED ORDER — ACETAMINOPHEN 325 MG RE SUPP: 325.0000 mg | RECTAL | Status: DC | PRN

## 2015-01-22 MED ORDER — ONDANSETRON HCL 4 MG/2ML IJ SOLN: 4.0000 mg | INTRAMUSCULAR | Status: DC | PRN

## 2015-01-22 MED ORDER — CLOPIDOGREL BISULFATE 75 MG PO TABS
75.0000 mg | ORAL_TABLET | Freq: Once | ORAL | Status: AC
  Administered 2015-01-22: 75 mg via ORAL

## 2015-01-22 SURGICAL SUPPLY — 14 items
CATH ANGIO PIGTAIL 5FR 100 (CATHETERS) ×1 IMPLANT
CATH BEACON 5 .035 100 SIM1 TP (CATHETERS) ×3 IMPLANT
CATH PIG 5.0X100 (CATHETERS) ×3
DEVICE STARCLOSE SE CLOSURE (Vascular Products) ×2 IMPLANT
DEVICE TORQUE (MISCELLANEOUS) ×3 IMPLANT
GLIDEWIRE ANGLED SS 035X260CM (WIRE) ×3 IMPLANT
KIT CAROTID MANIFOLD (MISCELLANEOUS) ×3 IMPLANT
PACK ANGIOGRAPHY (CUSTOM PROCEDURE TRAY) ×3 IMPLANT
SET INTRO CAPELLA COAXIAL (SET/KITS/TRAYS/PACK) ×3 IMPLANT
SHEATH BRITE TIP 6FRX11 (SHEATH) ×3 IMPLANT
SHEATH SHUTTLE SELECT 6F (SHEATH) ×3 IMPLANT
TOWEL OR 17X26 4PK STRL BLUE (TOWEL DISPOSABLE) ×3 IMPLANT
WIRE AMPLATZ SSTIFF .035X260CM (WIRE) ×3 IMPLANT
WIRE J 3MM .035X145CM (WIRE) ×3 IMPLANT

## 2015-01-22 NOTE — Op Note (Signed)
[facility]  Percutaneous Study/Intervention Procedural Note   Date of Surgery: 01/22/2015  Surgeon:Schnier, Dolores Lory   Pre-operative Diagnosis:  Stenosis of the right internal carotid artery; episode of a fascia; generalized weakness Post-operative diagnosis:  Same  Procedure(s) Performed:  1.  Arch aortogram  2.  Selective injection of the right cervical and cerebral carotid arteries  3.  Angiography of the intracranial vessels   Anesthesia: Conscious sedation  Sheath: 6 French right common femoral artery retrograde  Contrast: 60 cc  Fluoroscopy Time: 4.6  Indications:  The patient was admitted to the hospital recently with neurological symptoms although somewhat vague with global weakness of her speech difficulty of was well documented however she is right-handed and it would be more typical for the speech to be effected from a left hemispheric event. Nevertheless, workup demonstrated high-grade stenosis of the right internal carotid artery and moderate stenosis of the left. Given these findings she is undergoing angiography to better document the pattern of disease and to treat the high-grade stenosis of the right.  Procedure:  LAHELA WOODIN a 81 y.o. female who was identified and appropriate procedural time out was performed.  The patient was then placed supine on the table and prepped and draped in the usual sterile fashion.  Ultrasound was used to evaluate the right common femoral artery.  It was patent .  A digital ultrasound image was acquired.  A micropuncture needle was used to access the right common femoral artery under direct ultrasound guidance and a permanent image was performed.  A 0.035 J wire was advanced without resistance and a 6Fr sheath was placed.    The pigtail catheter was advanced to the descending aorta and a 35 LAO projection of the aortic arch was obtained. Type III arch was noted with diffuse atherosclerotic changes there is a  50% stenosis at the origin of the left common carotid. Bovine anatomy is noted.  3000 units of heparin was given and the pigtail catheter was exchanged over a Glidewire for a Simmons 1 which was then used to select the right carotid system. Hand injections of contrast were then used to demonstrate the cervical and cerebral carotid arteries as well as the intracranial views. Both and RAO LAO AP and true lateral view was obtained of the cervical carotid. Lateral and Waters views were obtained for the intracranial vessels.  After review these images the wires reintroduced and the catheter removed over the wire subsequently RAO projection of the right groin was obtained and a Star close device successfully deployed. There are no immediate complications.  Interpretation the arch as noted above is a type III arch with diffuse atherosclerotic changes. It is also bovine anatomy. There is a 50% narrowing of the common carotid on the left at its origin. The common carotid and visualized portions of the subclavian and the right are widely patent. The vertebral artery is quite large and widely patent.   The carotid bifurcation on the right demonstrates diffuse calcifications easily seen under plain fluoroscopy. With contrast enhancement in AP RAO and lateral the lesion appears to be only 40-50%. In a steep LAO projection the lesion appears to be a proximal 60%. By computer guided measurements the diameter reduction of the internal carotid is 64%. Intracranially patient does cross fill from the right side injection and fills the left anterior cerebral and middle cerebral and right anterior cerebral are all widely patent. Venous phase appears normal.  Given the findings on angiography I do  not believe that further intervention is warranted at this time. She does not appear to cross the threshold that would support carotid stenting.  I believe we should continue maximal antiplatelet therapy and will follow her carotid  disease by duplex ultrasound.     Disposition: Patient was taken to the recovery room in stable condition having tolerated the procedure well.  Schnier, Dolores Lory 01/22/2015,9:48 AM

## 2015-01-22 NOTE — OR Nursing (Signed)
Ardelle Lesches  RN and Thermon Leyland RN in to administer medication and monitor patient

## 2015-01-22 NOTE — Discharge Instructions (Signed)

## 2015-01-30 NOTE — H&P (Signed)
[facility] History & Physical Update  The patient was interviewed and re-examined.  The patient's previous History and Physical has been reviewed and is unchanged.  There is no change in the plan of care. We plan to proceed with the scheduled procedure.  Yolanda Turner, Yolanda Turner  01/30/2015,10:21 AM

## 2015-09-23 ENCOUNTER — Encounter: Payer: Self-pay | Admitting: *Deleted

## 2015-09-27 NOTE — H&P (Signed)
See scanned note.

## 2015-09-28 ENCOUNTER — Ambulatory Visit: Payer: Medicare Other | Admitting: Anesthesiology

## 2015-09-28 ENCOUNTER — Encounter: Payer: Self-pay | Admitting: *Deleted

## 2015-09-28 ENCOUNTER — Ambulatory Visit
Admission: RE | Admit: 2015-09-28 | Discharge: 2015-09-28 | Disposition: A | Discharge status: Home / Self Care | Payer: Medicare Other | Source: Ambulatory Visit | Attending: Ophthalmology | Admitting: Ophthalmology

## 2015-09-28 ENCOUNTER — Encounter
Admission: RE | Disposition: A | Discharge status: Home / Self Care | Payer: Self-pay | Source: Ambulatory Visit | Attending: Ophthalmology

## 2015-09-28 DIAGNOSIS — Z951 Presence of aortocoronary bypass graft: Secondary | ICD-10-CM | POA: Insufficient documentation

## 2015-09-28 DIAGNOSIS — H269 Unspecified cataract: Secondary | ICD-10-CM | POA: Diagnosis present

## 2015-09-28 DIAGNOSIS — I10 Essential (primary) hypertension: Secondary | ICD-10-CM | POA: Diagnosis not present

## 2015-09-28 DIAGNOSIS — M810 Age-related osteoporosis without current pathological fracture: Secondary | ICD-10-CM | POA: Insufficient documentation

## 2015-09-28 DIAGNOSIS — H25041 Posterior subcapsular polar age-related cataract, right eye: Secondary | ICD-10-CM | POA: Diagnosis not present

## 2015-09-28 DIAGNOSIS — Z79899 Other long term (current) drug therapy: Secondary | ICD-10-CM | POA: Diagnosis not present

## 2015-09-28 DIAGNOSIS — Z9889 Other specified postprocedural states: Secondary | ICD-10-CM | POA: Diagnosis not present

## 2015-09-28 DIAGNOSIS — K219 Gastro-esophageal reflux disease without esophagitis: Secondary | ICD-10-CM | POA: Insufficient documentation

## 2015-09-28 DIAGNOSIS — E7800 Pure hypercholesterolemia, unspecified: Secondary | ICD-10-CM | POA: Diagnosis not present

## 2015-09-28 DIAGNOSIS — H2511 Age-related nuclear cataract, right eye: Secondary | ICD-10-CM | POA: Insufficient documentation

## 2015-09-28 DIAGNOSIS — E119 Type 2 diabetes mellitus without complications: Secondary | ICD-10-CM | POA: Insufficient documentation

## 2015-09-28 DIAGNOSIS — Z8673 Personal history of transient ischemic attack (TIA), and cerebral infarction without residual deficits: Secondary | ICD-10-CM | POA: Diagnosis not present

## 2015-09-28 DIAGNOSIS — I252 Old myocardial infarction: Secondary | ICD-10-CM | POA: Diagnosis not present

## 2015-09-28 DIAGNOSIS — Z880 Allergy status to penicillin: Secondary | ICD-10-CM | POA: Diagnosis not present

## 2015-09-28 DIAGNOSIS — Z87891 Personal history of nicotine dependence: Secondary | ICD-10-CM | POA: Diagnosis not present

## 2015-09-28 HISTORY — DX: Essential (primary) hypertension: I10

## 2015-09-28 HISTORY — PX: CATARACT EXTRACTION W/PHACO: SHX586

## 2015-09-28 HISTORY — DX: Cerebral infarction, unspecified: I63.9

## 2015-09-28 HISTORY — DX: Gastro-esophageal reflux disease without esophagitis: K21.9

## 2015-09-28 HISTORY — DX: Age-related osteoporosis without current pathological fracture: M81.0

## 2015-09-28 HISTORY — DX: Pneumonia, unspecified organism: J18.9

## 2015-09-28 LAB — GLUCOSE, CAPILLARY: GLUCOSE-CAPILLARY: 79 mg/dL (ref 65–99)

## 2015-09-28 SURGERY — PHACOEMULSIFICATION, CATARACT, WITH IOL INSERTION
Anesthesia: Monitor Anesthesia Care | Site: Eye | Laterality: Right | Wound class: Clean

## 2015-09-28 MED ORDER — TETRACAINE HCL 0.5 % OP SOLN
OPHTHALMIC | Status: AC
  Filled 2015-09-28: qty 2

## 2015-09-28 MED ORDER — NA CHONDROIT SULF-NA HYALURON 40-17 MG/ML IO SOLN
INTRAOCULAR | Status: DC | PRN
  Administered 2015-09-28: 1 mL via INTRAOCULAR

## 2015-09-28 MED ORDER — POVIDONE-IODINE 5 % OP SOLN
OPHTHALMIC | Status: AC
  Filled 2015-09-28: qty 30

## 2015-09-28 MED ORDER — CARBACHOL 0.01 % IO SOLN
INTRAOCULAR | Status: DC | PRN
  Administered 2015-09-28: .5 mL via INTRAOCULAR

## 2015-09-28 MED ORDER — CEFUROXIME OPHTHALMIC INJECTION 1 MG/0.1 ML
INJECTION | OPHTHALMIC | Status: AC
  Filled 2015-09-28: qty 0.1

## 2015-09-28 MED ORDER — CYCLOPENTOLATE HCL 2 % OP SOLN
1.0000 [drp] | OPHTHALMIC | Status: AC
  Administered 2015-09-28 (×4): 1 [drp] via OPHTHALMIC

## 2015-09-28 MED ORDER — HYALURONIDASE HUMAN 150 UNIT/ML IJ SOLN
INTRAMUSCULAR | Status: AC
  Filled 2015-09-28: qty 1

## 2015-09-28 MED ORDER — PHENYLEPHRINE HCL 10 % OP SOLN
1.0000 [drp] | OPHTHALMIC | Status: AC
  Administered 2015-09-28 (×4): 1 [drp] via OPHTHALMIC

## 2015-09-28 MED ORDER — BUPIVACAINE HCL (PF) 0.75 % IJ SOLN
INTRAMUSCULAR | Status: AC
  Filled 2015-09-28: qty 10

## 2015-09-28 MED ORDER — LIDOCAINE HCL (PF) 4 % IJ SOLN
INTRAMUSCULAR | Status: DC | PRN
  Administered 2015-09-28: 4 mL via OPHTHALMIC

## 2015-09-28 MED ORDER — NA CHONDROIT SULF-NA HYALURON 40-17 MG/ML IO SOLN
INTRAOCULAR | Status: AC
  Filled 2015-09-28: qty 1

## 2015-09-28 MED ORDER — MOXIFLOXACIN HCL 0.5 % OP SOLN
1.0000 [drp] | OPHTHALMIC | Status: AC
  Administered 2015-09-28 (×3): 1 [drp] via OPHTHALMIC

## 2015-09-28 MED ORDER — MOXIFLOXACIN HCL 0.5 % OP SOLN
OPHTHALMIC | Status: AC
  Administered 2015-09-28: 1 [drp] via OPHTHALMIC
  Filled 2015-09-28: qty 3

## 2015-09-28 MED ORDER — MOXIFLOXACIN HCL 0.5 % OP SOLN
OPHTHALMIC | Status: DC | PRN
  Administered 2015-09-28: 1 [drp] via OPHTHALMIC

## 2015-09-28 MED ORDER — CYCLOPENTOLATE HCL 2 % OP SOLN
OPHTHALMIC | Status: AC
  Administered 2015-09-28: 1 [drp] via OPHTHALMIC
  Filled 2015-09-28: qty 2

## 2015-09-28 MED ORDER — SODIUM CHLORIDE 0.9 % IV SOLN
INTRAVENOUS | Status: DC
  Administered 2015-09-28: 06:00:00 via INTRAVENOUS

## 2015-09-28 MED ORDER — ALFENTANIL 500 MCG/ML IJ INJ
INJECTION | INTRAMUSCULAR | Status: DC | PRN
  Administered 2015-09-28: 500 ug via INTRAVENOUS

## 2015-09-28 MED ORDER — EPINEPHRINE HCL 1 MG/ML IJ SOLN
INTRAOCULAR | Status: DC | PRN
  Administered 2015-09-28: 1 mL via OPHTHALMIC

## 2015-09-28 MED ORDER — LIDOCAINE HCL (PF) 4 % IJ SOLN
INTRAMUSCULAR | Status: AC
  Filled 2015-09-28: qty 5

## 2015-09-28 MED ORDER — POVIDONE-IODINE 5 % OP SOLN
OPHTHALMIC | Status: DC | PRN
  Administered 2015-09-28: 1 via OPHTHALMIC

## 2015-09-28 MED ORDER — TETRACAINE HCL 0.5 % OP SOLN
OPHTHALMIC | Status: DC | PRN
  Administered 2015-09-28: 1 [drp] via OPHTHALMIC

## 2015-09-28 MED ORDER — ONDANSETRON HCL 4 MG/2ML IJ SOLN
4.0000 mg | Freq: Once | INTRAMUSCULAR | Status: AC
  Administered 2015-09-28: 4 mg via INTRAVENOUS

## 2015-09-28 MED ORDER — LIDOCAINE HCL (PF) 4 % IJ SOLN
INTRAOCULAR | Status: DC | PRN
  Administered 2015-09-28: .5 mL via OPHTHALMIC

## 2015-09-28 MED ORDER — PROMETHAZINE HCL 25 MG/ML IJ SOLN
6.2500 mg | Freq: Once | INTRAMUSCULAR | Status: AC
  Administered 2015-09-28: 6.25 mg via INTRAVENOUS
  Filled 2015-09-28: qty 1

## 2015-09-28 MED ORDER — NON FORMULARY
6.2500 mg | Freq: Once | Status: DC
  Administered 2015-09-28: 6.25 mg via INTRAVENOUS

## 2015-09-28 MED ORDER — ONDANSETRON HCL 4 MG/2ML IJ SOLN
INTRAMUSCULAR | Status: AC
  Filled 2015-09-28: qty 2

## 2015-09-28 MED ORDER — EPINEPHRINE HCL 1 MG/ML IJ SOLN
INTRAMUSCULAR | Status: AC
  Filled 2015-09-28: qty 2

## 2015-09-28 MED ORDER — PHENYLEPHRINE HCL 10 % OP SOLN
OPHTHALMIC | Status: AC
  Administered 2015-09-28: 1 [drp] via OPHTHALMIC
  Filled 2015-09-28: qty 5

## 2015-09-28 SURGICAL SUPPLY — 31 items
CANNULA ANT/CHMB 27G (MISCELLANEOUS) ×1 IMPLANT
CANNULA ANT/CHMB 27GA (MISCELLANEOUS) ×3 IMPLANT
CORD BIP STRL DISP 12FT (MISCELLANEOUS) ×3 IMPLANT
CUP MEDICINE 2OZ PLAST GRAD ST (MISCELLANEOUS) ×3 IMPLANT
DRAPE XRAY CASSETTE 23X24 (DRAPES) ×3 IMPLANT
ERASER HMR WETFIELD 18G (MISCELLANEOUS) ×3 IMPLANT
GLOVE BIO SURGEON STRL SZ8 (GLOVE) ×3 IMPLANT
GLOVE SURG LX 6.5 MICRO (GLOVE) ×2
GLOVE SURG LX 8.0 MICRO (GLOVE) ×2
GLOVE SURG LX STRL 6.5 MICRO (GLOVE) ×1 IMPLANT
GLOVE SURG LX STRL 8.0 MICRO (GLOVE) ×1 IMPLANT
GOWN STRL REUS W/ TWL LRG LVL3 (GOWN DISPOSABLE) ×1 IMPLANT
GOWN STRL REUS W/ TWL XL LVL3 (GOWN DISPOSABLE) ×1 IMPLANT
GOWN STRL REUS W/TWL LRG LVL3 (GOWN DISPOSABLE) ×3
GOWN STRL REUS W/TWL XL LVL3 (GOWN DISPOSABLE) ×3
LENS IOL ACRSF IQ ULTRA 18.5 (Intraocular Lens) IMPLANT
LENS IOL ACRYSOF IQ 18.5 (Intraocular Lens) ×3 IMPLANT
PACK CATARACT (MISCELLANEOUS) ×3 IMPLANT
PACK CATARACT DINGLEDEIN LX (MISCELLANEOUS) ×3 IMPLANT
PACK EYE AFTER SURG (MISCELLANEOUS) ×3 IMPLANT
SHLD EYE VISITEC  UNIV (MISCELLANEOUS) ×3 IMPLANT
SOL BSS BAG (MISCELLANEOUS) ×3
SOL PREP PVP 2OZ (MISCELLANEOUS) ×3
SOLUTION BSS BAG (MISCELLANEOUS) ×1 IMPLANT
SOLUTION PREP PVP 2OZ (MISCELLANEOUS) ×1 IMPLANT
SUT SILK 5-0 (SUTURE) ×3 IMPLANT
SYR 3ML LL SCALE MARK (SYRINGE) ×3 IMPLANT
SYR 5ML LL (SYRINGE) ×3 IMPLANT
SYR TB 1ML 27GX1/2 LL (SYRINGE) ×3 IMPLANT
WATER STERILE IRR 1000ML POUR (IV SOLUTION) ×3 IMPLANT
WIPE NON LINTING 3.25X3.25 (MISCELLANEOUS) ×3 IMPLANT

## 2015-09-28 NOTE — Transfer of Care (Signed)
Immediate Anesthesia Transfer of Care Note  Patient: JANILA SYLLA  Procedure(s) Performed: Procedure(s) with comments: CATARACT EXTRACTION PHACO AND INTRAOCULAR LENS PLACEMENT (IOC) (Right) - [place] 041:18 AP% 28.6 CDE 114.94 fluid pack lot # [id number]  Patient Location: PACU  Anesthesia Type:MAC  Level of Consciousness: awake, alert , oriented and patient cooperative  Airway & Oxygen Therapy: Patient Spontanous Breathing  Post-op Assessment: Report given to RN, Post -op Vital signs reviewed and stable and Patient moving all extremities X 4  Post vital signs: Reviewed and stable  Last Vitals:  Filed Vitals:   09/23/15 1037 09/28/15 0613  BP: 134/61 164/60  Pulse: 75 65  Temp:  36.5 C  Resp:  16    Complications: No apparent anesthesia complications

## 2015-09-28 NOTE — Anesthesia Preprocedure Evaluation (Signed)
Anesthesia Evaluation  Patient identified by MRN, date of birth, ID band Patient awake    Reviewed: Allergy & Precautions, NPO status , Patient's Chart, lab work & pertinent test results  Airway Mallampati: II       Dental  (+) Edentulous Upper, Poor Dentition, Partial Lower   Pulmonary COPD, former smoker,     + decreased breath sounds      Cardiovascular Exercise Tolerance: Poor hypertension, Pt. on home beta blockers + CAD, + Past MI and + Peripheral Vascular Disease   Rhythm:Regular     Neuro/Psych    GI/Hepatic Neg liver ROS, GERD  ,  Endo/Other  diabetes, Type 2  Renal/GU negative Renal ROS     Musculoskeletal negative musculoskeletal ROS (+)   Abdominal Normal abdominal exam  (+)   Peds negative pediatric ROS (+)  Hematology negative hematology ROS (+)   Anesthesia Other Findings   Reproductive/Obstetrics                             Anesthesia Physical Anesthesia Plan  ASA: III  Anesthesia Plan: MAC   Post-op Pain Management:    Induction: Intravenous  Airway Management Planned: Natural Airway and Nasal Cannula  Additional Equipment:   Intra-op Plan:   Post-operative Plan:   Informed Consent:   Plan Discussed with: CRNA  Anesthesia Plan Comments:         Anesthesia Quick Evaluation

## 2015-09-28 NOTE — Op Note (Signed)
Date of Surgery: 09/28/2015 Date of Dictation: 09/28/2015 8:19 AM Pre-operative Diagnosis:  Nuclear Sclerotic Cataract and Posterior Subcapsular Cataract right Eye Post-operative Diagnosis: same Procedure performed: Extra-capsular Cataract Extraction (ECCE) with placement of a posterior chamber intraocular lens (IOL) right Eye IOL:  Implant Name Type Inv. Item Serial No. Manufacturer Lot No. LRB No. Used  LENS IOL ACRYSOF IQ 18.5 - [id number] Intraocular Lens LENS IOL ACRYSOF IQ 18.5 [id number] [id number] Right 1   Anesthesia: 2% Lidocaine and 4% Marcaine in a 50/50 mixture with 10 unites/ml of Hylenex given as a peribulbar Anesthesiologist: Anesthesiologist: Gijsbertus Lonia Mad, MD CRNA: Silvana Newness, CRNA Complications: none Estimated Blood Loss: less than 1 ml  Description of procedure:  The patient was given anesthesia and sedation via intravenous access. The patient was then prepped and draped in the usual fashion. A 25-gauge needle was bent for initiating the capsulorhexis. A 5-0 silk suture was placed through the conjunctiva superior and inferiorly to serve as bridle sutures. Hemostasis was obtained at the superior limbus using an eraser cautery. A partial thickness groove was made at the anterior surgical limbus with a 64 Beaver blade and this was dissected anteriorly with an Avaya. The anterior chamber was entered at 10 o'clock with a 1.0 mm paracentesis knife and through the lamellar dissection with a 2.6 mm Alcon keratome. Epi-Shugarcaine 0.5 CC [9 cc BSS Plus (Alcon), 3 cc 4% preservative-free lidocaine (Hospira) and 4 cc 1:1000 preservative-free, bisulfite-free epinephrine] was injected into the anterior chamber via the paracentesis tract. Epi-Shugarcaine 0.5 CC [9 cc BSS Plus (Alcon), 3 cc 4% preservative-free lidocaine (Hospira) and 4 cc 1:1000 preservative-free, bisulfite-free epinephrine] was injected into the anterior chamber via the paracentesis tract.  DiscoVisc was injected to replace the aqueous and a continuous tear curvilinear capsulorhexis was performed using a bent 25-gauge needle.  Balance salt on a syringe was used to perform hydro-dissection and phacoemulsification was carried out using a divide and conquer technique. Procedure(s) with comments: CATARACT EXTRACTION PHACO AND INTRAOCULAR LENS PLACEMENT (IOC) (Right) - [place] 041:18 AP% 28.6 CDE 114.94 fluid pack lot # [id number]. Irrigation/aspiration was used to remove the residual cortex and the capsular bag was inflated with DiscoVisc. The intraocular lens was inserted into the capsular bag using a pre-loaded UltraSert Delivery System. Irrigation/aspiration was used to remove the residual DiscoVisc. The wound was inflated with balanced salt and checked for leaks. None were found. Miostat was injected via the paracentesis track and 0.1 ml of Vigamox containing 1 mg of drug  was injected via the paracentesis track. The wound was checked for leaks again and none were found.   The bridal sutures were removed and two drops of Vigamox were placed on the eye. An eye shield was placed to protect the eye and the patient was discharged to the recovery area in good condition.   Trennon Torbeck MD

## 2015-09-28 NOTE — Anesthesia Postprocedure Evaluation (Signed)
Anesthesia Post Note  Patient: Yolanda Turner  Procedure(s) Performed: Procedure(s) (LRB): CATARACT EXTRACTION PHACO AND INTRAOCULAR LENS PLACEMENT (IOC) (Right)  Patient location during evaluation: PACU Anesthesia Type: MAC Level of consciousness: awake and alert Pain management: pain level controlled Vital Signs Assessment: post-procedure vital signs reviewed and stable Respiratory status: respiratory function stable Cardiovascular status: blood pressure returned to baseline Anesthetic complications: no    Last Vitals:  Filed Vitals:   09/23/15 1037 09/28/15 0613  BP: 134/61 164/60  Pulse: 75 65  Temp:  36.5 C  Resp:  16    Last Pain: There were no vitals filed for this visit.               Silvana Newness A

## 2015-09-28 NOTE — Interval H&P Note (Signed)
History and Physical Interval Note:  09/28/2015 7:22 AM  Yolanda Turner  has presented today for surgery, with the diagnosis of CATARACT  The various methods of treatment have been discussed with the patient and family. After consideration of risks, benefits and other options for treatment, the patient has consented to  Procedure(s): CATARACT EXTRACTION PHACO AND INTRAOCULAR LENS PLACEMENT ([place]) (Right) as a surgical intervention .  The patient's history has been reviewed, patient examined, no change in status, stable for surgery.  I have reviewed the patient's chart and labs.  Questions were answered to the patient's satisfaction.     [name]

## 2015-09-28 NOTE — Discharge Instructions (Addendum)
See scanned note. Eye Surgery Discharge Instructions  Expect mild scratchy sensation or mild soreness. DO NOT RUB YOUR EYE!  The day of surgery:  Minimal physical activity, but bed rest is not required  No reading, computer work, or close hand work  No bending, lifting, or straining.  May watch TV  For 24 hours:  No driving, legal decisions, or alcoholic beverages  Safety precautions  Eat anything you prefer: It is better to start with liquids, then soup then solid foods.  _____ Eye patch should be worn until postoperative exam tomorrow.  ____ Solar shield eyeglasses should be worn for comfort in the sunlight/patch while sleeping  Resume all regular medications including aspirin or Coumadin if these were discontinued prior to surgery. You may shower, bathe, shave, or wash your hair. Tylenol may be taken for mild discomfort.  Call your doctor if you experience significant pain, nausea, or vomiting, fever > 101 or other signs of infection. [phone] or [phone] Specific instructions:  Follow-up Information    Follow up with DE NICOLA, BERNADETTE, PA-C.   Specialty:  Physician Assistant   Contact information:   [place] [address] [phone]       Follow up with Estill Cotta, MD.   Specialty:  Ophthalmology   Why:  For wound re-check @ 10:40   Contact information:   [address]   [place] [address] [phone]

## 2015-09-28 NOTE — OR Nursing (Signed)
Dr Laureen Abrahams called for extreeme Nausea--ordered zofran IV--ineffective--he then ordered Phenergan 6.25 IV gigen and patient resting.

## 2017-04-02 ENCOUNTER — Encounter: Payer: Self-pay | Admitting: Emergency Medicine

## 2017-04-02 ENCOUNTER — Inpatient Hospital Stay
Admission: EM | Admit: 2017-04-02 | Discharge: 2017-04-04 | DRG: 064 | Disposition: A | Discharge status: Hospice | Payer: Medicare Other | Attending: Internal Medicine | Admitting: Internal Medicine

## 2017-04-02 ENCOUNTER — Observation Stay: Payer: Medicare Other

## 2017-04-02 ENCOUNTER — Emergency Department: Payer: Medicare Other

## 2017-04-02 DIAGNOSIS — Z66 Do not resuscitate: Secondary | ICD-10-CM | POA: Diagnosis not present

## 2017-04-02 DIAGNOSIS — G459 Transient cerebral ischemic attack, unspecified: Secondary | ICD-10-CM | POA: Diagnosis present

## 2017-04-02 DIAGNOSIS — I2510 Atherosclerotic heart disease of native coronary artery without angina pectoris: Secondary | ICD-10-CM | POA: Diagnosis present

## 2017-04-02 DIAGNOSIS — Z880 Allergy status to penicillin: Secondary | ICD-10-CM

## 2017-04-02 DIAGNOSIS — Z8542 Personal history of malignant neoplasm of other parts of uterus: Secondary | ICD-10-CM

## 2017-04-02 DIAGNOSIS — R4182 Altered mental status, unspecified: Secondary | ICD-10-CM | POA: Diagnosis present

## 2017-04-02 DIAGNOSIS — I10 Essential (primary) hypertension: Secondary | ICD-10-CM | POA: Diagnosis present

## 2017-04-02 DIAGNOSIS — I639 Cerebral infarction, unspecified: Secondary | ICD-10-CM | POA: Diagnosis not present

## 2017-04-02 DIAGNOSIS — M810 Age-related osteoporosis without current pathological fracture: Secondary | ICD-10-CM | POA: Diagnosis present

## 2017-04-02 DIAGNOSIS — E43 Unspecified severe protein-calorie malnutrition: Secondary | ICD-10-CM | POA: Diagnosis present

## 2017-04-02 DIAGNOSIS — Z8673 Personal history of transient ischemic attack (TIA), and cerebral infarction without residual deficits: Secondary | ICD-10-CM

## 2017-04-02 DIAGNOSIS — E1151 Type 2 diabetes mellitus with diabetic peripheral angiopathy without gangrene: Secondary | ICD-10-CM | POA: Diagnosis present

## 2017-04-02 DIAGNOSIS — I63512 Cerebral infarction due to unspecified occlusion or stenosis of left middle cerebral artery: Principal | ICD-10-CM | POA: Diagnosis present

## 2017-04-02 DIAGNOSIS — R29710 NIHSS score 10: Secondary | ICD-10-CM | POA: Diagnosis present

## 2017-04-02 DIAGNOSIS — Z801 Family history of malignant neoplasm of trachea, bronchus and lung: Secondary | ICD-10-CM

## 2017-04-02 DIAGNOSIS — Z951 Presence of aortocoronary bypass graft: Secondary | ICD-10-CM

## 2017-04-02 DIAGNOSIS — G8191 Hemiplegia, unspecified affecting right dominant side: Secondary | ICD-10-CM | POA: Diagnosis present

## 2017-04-02 DIAGNOSIS — R410 Disorientation, unspecified: Secondary | ICD-10-CM

## 2017-04-02 DIAGNOSIS — R471 Dysarthria and anarthria: Secondary | ICD-10-CM

## 2017-04-02 DIAGNOSIS — Z7902 Long term (current) use of antithrombotics/antiplatelets: Secondary | ICD-10-CM

## 2017-04-02 DIAGNOSIS — R4701 Aphasia: Secondary | ICD-10-CM | POA: Diagnosis present

## 2017-04-02 DIAGNOSIS — R1310 Dysphagia, unspecified: Secondary | ICD-10-CM | POA: Diagnosis present

## 2017-04-02 DIAGNOSIS — E785 Hyperlipidemia, unspecified: Secondary | ICD-10-CM | POA: Diagnosis present

## 2017-04-02 DIAGNOSIS — Z515 Encounter for palliative care: Secondary | ICD-10-CM | POA: Diagnosis not present

## 2017-04-02 DIAGNOSIS — Z681 Body mass index (BMI) 19 or less, adult: Secondary | ICD-10-CM

## 2017-04-02 DIAGNOSIS — I252 Old myocardial infarction: Secondary | ICD-10-CM

## 2017-04-02 DIAGNOSIS — Z87891 Personal history of nicotine dependence: Secondary | ICD-10-CM

## 2017-04-02 DIAGNOSIS — R479 Unspecified speech disturbances: Secondary | ICD-10-CM

## 2017-04-02 LAB — COMPREHENSIVE METABOLIC PANEL
ALT: 13 U/L — AB (ref 14–54)
AST: 23 U/L (ref 15–41)
Albumin: 4 g/dL (ref 3.5–5.0)
Alkaline Phosphatase: 70 U/L (ref 38–126)
Anion gap: 1 — ABNORMAL LOW (ref 5–15)
BILIRUBIN TOTAL: 0.7 mg/dL (ref 0.3–1.2)
BUN: 19 mg/dL (ref 6–20)
CHLORIDE: 106 mmol/L (ref 101–111)
CO2: 27 mmol/L (ref 22–32)
CREATININE: 0.87 mg/dL (ref 0.44–1.00)
Calcium: 9.3 mg/dL (ref 8.9–10.3)
GFR calc Af Amer: >60 mL/min (ref >60)
GFR, EST NON AFRICAN AMERICAN: 57 mL/min — AB (ref >60)
GLUCOSE: 160 mg/dL — AB (ref 65–99)
POTASSIUM: 4.1 mmol/L (ref 3.5–5.1)
SODIUM: 134 mmol/L — AB (ref 135–145)
Total Protein: 6.6 g/dL (ref 6.5–8.1)

## 2017-04-02 LAB — CBC WITH DIFFERENTIAL/PLATELET
BASOS ABS: 0 10*3/uL (ref 0–0.1)
Basophils Relative: 1 %
EOS ABS: 0.1 10*3/uL (ref 0–0.7)
EOS PCT: 1 %
HCT: 36.1 % (ref 35.0–47.0)
HEMOGLOBIN: 11.7 g/dL — AB (ref 12.0–16.0)
LYMPHS PCT: 11 %
Lymphs Abs: 0.4 10*3/uL — ABNORMAL LOW (ref 1.0–3.6)
MCH: 28.4 pg (ref 26.0–34.0)
MCHC: 32.4 g/dL (ref 32.0–36.0)
MCV: 87.6 fL (ref 80.0–100.0)
Monocytes Absolute: 0.3 10*3/uL (ref 0.2–0.9)
Monocytes Relative: 7 %
NEUTROS PCT: 80 %
Neutro Abs: 3 10*3/uL (ref 1.4–6.5)
PLATELETS: 173 10*3/uL (ref 150–440)
RBC: 4.12 MIL/uL (ref 3.80–5.20)
RDW: 13.9 % (ref 11.5–14.5)
WBC: 3.8 10*3/uL (ref 3.6–11.0)

## 2017-04-02 LAB — LIPID PANEL
CHOL/HDL RATIO: 2.1 ratio
Cholesterol: 123 mg/dL (ref 0–200)
HDL: 60 mg/dL (ref >40)
LDL Cholesterol: 41 mg/dL (ref 0–99)
Triglycerides: 108 mg/dL (ref <150)
VLDL: 22 mg/dL (ref 0–40)

## 2017-04-02 LAB — TROPONIN I: Troponin I: <0.03 ng/mL (ref <0.03)

## 2017-04-02 MED ORDER — ONDANSETRON HCL 4 MG/2ML IJ SOLN
4.0000 mg | Freq: Once | INTRAMUSCULAR | Status: AC
  Administered 2017-04-02: 4 mg via INTRAVENOUS

## 2017-04-02 MED ORDER — HEPARIN SODIUM (PORCINE) 5000 UNIT/ML IJ SOLN
5000.0000 [IU] | Freq: Three times a day (TID) | INTRAMUSCULAR | Status: DC
  Administered 2017-04-02 – 2017-04-04 (×5): 5000 [IU] via SUBCUTANEOUS
  Filled 2017-04-02 (×5): qty 1

## 2017-04-02 MED ORDER — SODIUM CHLORIDE 0.9 % IV SOLN
INTRAVENOUS | Status: DC
  Administered 2017-04-02 – 2017-04-03 (×2): via INTRAVENOUS

## 2017-04-02 MED ORDER — HYDRALAZINE HCL 20 MG/ML IJ SOLN: 10.0000 mg | Freq: Four times a day (QID) | INTRAMUSCULAR | Status: DC | PRN

## 2017-04-02 MED ORDER — DOCUSATE SODIUM 100 MG PO CAPS
100.0000 mg | ORAL_CAPSULE | Freq: Two times a day (BID) | ORAL | Status: DC | PRN
Start: 2017-04-02 — End: 2017-04-04

## 2017-04-02 MED ORDER — ONDANSETRON HCL 4 MG/2ML IJ SOLN
INTRAMUSCULAR | Status: AC
  Administered 2017-04-02: 4 mg via INTRAVENOUS
  Filled 2017-04-02: qty 2

## 2017-04-02 NOTE — ED Notes (Signed)
Patient transported to Ultrasound 

## 2017-04-02 NOTE — H&P (Signed)
[facility] NAME: Yolanda Turner    MR#:  [id number]  DATE OF BIRTH:  1925-12-23  DATE OF ADMISSION:  04/02/2017  PRIMARY CARE PHYSICIAN: Albina Billet, MD   REQUESTING/REFERRING PHYSICIAN: Burlene Arnt  CHIEF COMPLAINT:   Chief Complaint  Patient presents with  . Choking    HISTORY OF PRESENT ILLNESS: Yolanda Turner  is a 83 y.o. female with a known history of CAD, DM, Htn, Stroke- lives with daughter- walks with a cane, some memory issues, but able to walk and take care of herself. Today morning was fine, but later noted to have speech problem and noted chocking on liquids. She also complained to her daughter about pain in left leg and did not walk since the episode. Pt is frustrated as per family as she can not speak. History obtained from family.  PAST MEDICAL HISTORY:   Past Medical History:  Diagnosis Date  . Carotid artery stenosis   . Carotid artery stenosis   . Collagen vascular disease ([place])   . Coronary artery disease   . Diabetes mellitus without complication (HCC)    diet control  . GERD (gastroesophageal reflux disease)   . Hypertension   . Myocardial infarction ([place])   . Osteoporosis   . Peripheral vascular disease ([place])   . Pneumonia   . Renal artery stenosis ([place])   . Stroke ([place])   . Uterine cancer ([place])     PAST SURGICAL HISTORY: Past Surgical History:  Procedure Laterality Date  . ABDOMINAL HYSTERECTOMY    . CAROTID ENDARTERECTOMY    . CATARACT EXTRACTION W/PHACO Right 09/28/2015   Procedure: CATARACT EXTRACTION PHACO AND INTRAOCULAR LENS PLACEMENT (IOC);  Surgeon: Estill Cotta, MD;  Location: [facility] ORS;  Service: Ophthalmology;  Laterality: Right;  [place] 041:18 AP% 28.6 CDE 114.94 fluid pack lot # [id number]  . CORONARY ARTERY BYPASS GRAFT    . PERIPHERAL VASCULAR CATHETERIZATION Right 01/22/2015   Procedure: Carotid PTA/Stent Intervention;  Surgeon: Katha Cabal, MD;  Location: [facility];   Service: Cardiovascular;  Laterality: Right;  . uterine cancer surgery      SOCIAL HISTORY:  Social History  Substance Use Topics  . Smoking status: Former Research scientist (life sciences)  . Smokeless tobacco: Never Used  . Alcohol use No    FAMILY HISTORY:  Family History  Problem Relation Age of Onset  . Lung cancer Daughter     DRUG ALLERGIES:  Allergies  Allergen Reactions  . Penicillins Rash    Has patient had a PCN reaction causing immediate rash, facial/tongue/throat swelling, SOB or lightheadedness with hypotension: No Has patient had a PCN reaction causing severe rash involving mucus membranes or skin necrosis: No Has patient had a PCN reaction that required hospitalization: No Has patient had a PCN reaction occurring within the last 10 years: No If all of the above answers are "NO", then may proceed with Cephalosporin use.    REVIEW OF SYSTEMS:   Pt could not give any ROS due to speech problems. MEDICATIONS AT HOME:  Prior to Admission medications   Medication Sig Start Date End Date Taking? Authorizing Provider  clopidogrel (PLAVIX) 75 MG tablet Take 75 mg by mouth at bedtime.    Yes [provider]  gabapentin (NEURONTIN) 300 MG capsule Take 300 mg by mouth at bedtime.    Yes [provider]  lisinopril (PRINIVIL,ZESTRIL) 10 MG tablet Take 10 mg by mouth at bedtime.   Yes [provider]  metoprolol succinate (TOPROL-XL) 25 MG 24 hr tablet Take 25 mg by mouth daily. Take with or immediately following a meal.    Yes [provider]  simvastatin (ZOCOR) 40 MG tablet Take 40 mg by mouth daily at 6 PM.   Yes [provider]  vitamin B-12 (CYANOCOBALAMIN) 500 MCG tablet Take 500 mcg by mouth daily.   Yes [provider]      PHYSICAL EXAMINATION:   VITAL SIGNS: Height 5\' 1"  (1.549 m), weight 35 kg (77 lb 2.6 oz).  GENERAL:  83 y.o.-year-old thin patient lying in the bed with no acute distress.  EYES: Pupils equal, round, reactive to  light and accommodation. No scleral icterus. Extraocular muscles intact.  HEENT: Head atraumatic, normocephalic. Oropharynx and nasopharynx clear.  NECK:  Supple, no jugular venous distention. No thyroid enlargement, no tenderness.  LUNGS: Normal breath sounds bilaterally, no wheezing, rales,rhonchi or crepitation. No use of accessory muscles of respiration.  CARDIOVASCULAR: S1, S2 normal. No murmurs, rubs, or gallops.  ABDOMEN: Soft, nontender, nondistended. Bowel sounds present. No organomegaly or mass.  EXTREMITIES: No pedal edema, cyanosis, or clubbing.  NEUROLOGIC: Cranial nerves II through XII are intact. Muscle strength 4/5 in all extremities. Sensation intact. Gait not checked. Moves all limbs, and follows commands. Does not talk. PSYCHIATRIC: The patient is alert can not check orientation, as she is not talking.  SKIN: No obvious rash, lesion, or ulcer.   LABORATORY PANEL:   CBC  Recent Labs Lab 04/02/17 1424  WBC 3.8  HGB 11.7*  HCT 36.1  PLT 173  MCV 87.6  MCH 28.4  MCHC 32.4  RDW 13.9  LYMPHSABS 0.4*  MONOABS 0.3  EOSABS 0.1  BASOSABS 0.0   ------------------------------------------------------------------------------------------------------------------  Chemistries   Recent Labs Lab 04/02/17 1424  NA 134*  K 4.1  CL 106  CO2 27  GLUCOSE 160*  BUN 19  CREATININE 0.87  CALCIUM 9.3  AST 23  ALT 13*  ALKPHOS 70  BILITOT 0.7   ------------------------------------------------------------------------------------------------------------------ estimated creatinine clearance is 24.2 mL/min (by C-G formula based on SCr of 0.87 mg/dL). ------------------------------------------------------------------------------------------------------------------ No results for input(s): TSH, T4TOTAL, T3FREE, THYROIDAB in the last 72 hours.  Invalid input(s): FREET3   Coagulation profile No results for input(s): INR, PROTIME in the last 168  hours. ------------------------------------------------------------------------------------------------------------------- No results for input(s): DDIMER in the last 72 hours. -------------------------------------------------------------------------------------------------------------------  Cardiac Enzymes  Recent Labs Lab 04/02/17 1424  TROPONINI <0.03   ------------------------------------------------------------------------------------------------------------------ Invalid input(s): POCBNP  ---------------------------------------------------------------------------------------------------------------  Urinalysis No results found for: COLORURINE, APPEARANCEUR, LABSPEC, PHURINE, GLUCOSEU, HGBUR, BILIRUBINUR, KETONESUR, PROTEINUR, UROBILINOGEN, NITRITE, LEUKOCYTESUR   RADIOLOGY: Dg Chest 2 View  Result Date: 04/02/2017 CLINICAL DATA:  Choking.  Nausea. EXAM: CHEST  2 VIEW COMPARISON:  August 12, 2014 FINDINGS: Previous CABG with intact sternotomy wires. Mild cardiomegaly. The hila and mediastinum are normal. No pulmonary nodules, masses, or focal infiltrates. IMPRESSION: No active cardiopulmonary disease. Electronically Signed   By: Dorise Bullion III M.D   On: 04/02/2017 13:52   Ct Head Wo Contrast  Result Date: 04/02/2017 CLINICAL DATA:  83 year old female with altered level of consciousness and headache. EXAM: CT HEAD WITHOUT CONTRAST TECHNIQUE: Contiguous axial images were obtained from the base of the skull through the vertex without intravenous contrast. COMPARISON:  08/13/2014 MR, 08/12/2014 CT and prior studies FINDINGS: Brain: No evidence of acute infarction, hemorrhage, hydrocephalus, extra-axial collection or mass lesion/mass effect. Atrophy, chronic small-vessel white matter ischemic changes and remote right frontal, left occipital, left basal ganglia and left frontoparietal  infarcts noted. Vascular: Intracranial atherosclerotic calcifications noted Skull: Normal. Negative  for fracture or focal lesion. Sinuses/Orbits: Single opacified posterior left ethmoid air cell again noted. No acute abnormality Other: None IMPRESSION: 1. No evidence of acute intracranial abnormality 2. Atrophy, chronic small-vessel white matter ischemic changes and remote infarcts as described. Electronically Signed   By: Margarette Canada M.D.   On: 04/02/2017 14:10    EKG: Orders placed or performed during the hospital encounter of 04/02/17  . ED EKG  . ED EKG    IMPRESSION AND PLAN:  * Speech problem with dysphagia   Had some coughing with liquids earlier. Can not talk    TIA Vs stroke     Monitor on tele, MRI, Echo, Carotid doppler.    SLP , PT eval.     Check UA also.  * Htn    Hold meds    Give IV if needed  * HLP   Hold meds,   * CAD    Hold oral meds as dysphagia      All the records are reviewed and case discussed with ED provider. Management plans discussed with the patient, family and they are in agreement.  CODE STATUS: Full. Code Status History    Date Active Date Inactive Code Status Order ID Comments User Context   01/22/2015 10:36 AM 01/22/2015  3:22 PM Full Code [id number]  Schnier, Dolores Lory, MD Inpatient       TOTAL TIME TAKING CARE OF THIS PATIENT: 45 minutes.  Discussed with multiple family members in room.  Vaughan Basta M.D on 04/02/2017   Between 7am to 6pm - Pager - [phone]  After 6pm go to [email or web address] - password [email or web address] [facility]  Office  [phone]  CC: Primary care physician; Albina Billet, MD   Note: This dictation was prepared with Dragon dictation along with smaller phrase technology. Any transcriptional errors that result from this process are unintentional.

## 2017-04-02 NOTE — Progress Notes (Signed)
Pt arrived from ED with family member accompanying pt. VSS- will recheck BP. Alert and in no distress. Telemetry applied. IVF's started with family education started. Oncoming nurse to do assessment and admission profile.

## 2017-04-02 NOTE — ED Triage Notes (Signed)
Ems called out for pt choking on water - pt only c/o headache.

## 2017-04-02 NOTE — ED Provider Notes (Addendum)
[facility] Emergency Department Provider Note  ____________________________________________   I have reviewed the triage vital signs and the nursing notes.   HISTORY  Chief Complaint Choking    HPI Yolanda Turner is a 83 y.o. female  he presents today stating that she choked on water earlier but feels fine now. This is EMS was reported as well. No family or in the room with her. No other complaints.  2 different sets of family members have come in since the patient's arrival but none of them are actually present with the patient during the event.  I have therefore called the family at home, they stated that she sometimes gags on fluid and afterwards she doesn't feel like talking. This is what happened this morning she gagged on some fluid did not pass out, she has swelling issues apparently long-term somewhat related to prior CVA possibly but in any event, patient had a witnessed episode of gagging on water and then didn't feel like talking. Her daughter states she would not normally call 911 for this but there was someone else also present who did call 911. This is corresponded with what EMS told us and with what the patient reported. She states she just feels tired and has no other complaints. Family are in the room. They had issues about whether the patient may have had a recurrent stroke because she was "not talking" however patient is able to talk she is just whispering and according to the daughter who is normally with her that is not unusual for her especially after she has trouble swallowing.   Past Medical History:  Diagnosis Date  . Carotid artery stenosis   . Carotid artery stenosis   . Collagen vascular disease ([place])   . Coronary artery disease   . Diabetes mellitus without complication (HCC)    diet control  . GERD (gastroesophageal reflux disease)   . Hypertension   . Myocardial infarction ([place])   . Osteoporosis   . Peripheral vascular disease  ([place])   . Pneumonia   . Renal artery stenosis ([place])   . Stroke ([place])   . Uterine cancer ([place])     There are no active problems to display for this patient.   Past Surgical History:  Procedure Laterality Date  . ABDOMINAL HYSTERECTOMY    . CAROTID ENDARTERECTOMY    . CATARACT EXTRACTION W/PHACO Right 09/28/2015   Procedure: CATARACT EXTRACTION PHACO AND INTRAOCULAR LENS PLACEMENT (IOC);  Surgeon: Estill Cotta, MD;  Location: [facility] ORS;  Service: Ophthalmology;  Laterality: Right;  [place] 041:18 AP% 28.6 CDE 114.94 fluid pack lot # [id number]  . CORONARY ARTERY BYPASS GRAFT    . PERIPHERAL VASCULAR CATHETERIZATION Right 01/22/2015   Procedure: Carotid PTA/Stent Intervention;  Surgeon: Katha Cabal, MD;  Location: [facility];  Service: Cardiovascular;  Laterality: Right;  . uterine cancer surgery      Prior to Admission medications   Medication Sig Start Date End Date Taking? Authorizing Provider  clopidogrel (PLAVIX) 75 MG tablet Take 75 mg by mouth at bedtime.     [provider]  gabapentin (NEURONTIN) 300 MG capsule Take 600 mg by mouth at bedtime.     [provider]  lisinopril (PRINIVIL,ZESTRIL) 10 MG tablet Take 10 mg by mouth at bedtime.    [provider]  metoprolol succinate (TOPROL-XL) 50 MG 24 hr tablet Take 25 mg by mouth at bedtime. Take with or immediately following a meal.    [provider]  simvastatin (ZOCOR) 40 MG tablet Take 40 mg by mouth daily at 6 PM.    [provider]  vitamin B-12 (CYANOCOBALAMIN) 500 MCG tablet Take 500 mcg by mouth daily.    [provider]    Allergies Penicillins  History reviewed. No pertinent family history.  Social History Social History  Substance Use Topics  . Smoking status: Former Research scientist (life sciences)  . Smokeless tobacco: Never Used  . Alcohol use No    Review of Systems Level 5 chart caveat; no further history available due to patient  status.  ____________________________________________   PHYSICAL EXAM:  VITAL SIGNS: ED Triage Vitals [04/02/17 1254]  Enc Vitals Group     BP      Pulse      Resp      Temp      Temp src      SpO2      Weight 77 lb 2.6 oz (35 kg)     Height 5\' 1"  (1.549 m)     Head Circumference      Peak Flow      Pain Score 8     Pain Loc      Pain Edu?      Excl. in [place]?     Constitutional: alert and in no acute distress oriented to name, will not tell me that date or where she is exactly at but is not clear that because she does not feel like talking or cannot say Eyes: Conjunctivae are normal Head: Atraumatic HEENT: No congestion/rhinnorhea. Mucous membranes are moist.  Oropharynx non-erythematous Neck:   Nontender with no meningismus, no masses, no stridor Cardiovascular: Normal rate, regular rhythm. Grossly normal heart sounds.  Good peripheral circulation. Respiratory: Normal respiratory effort.  No retractions. Lungs CTAB. Abdominal: Soft and nontender. No distention. No guarding no rebound Back:  There is no focal tenderness or step off.  there is no midline tenderness there are no lesions noted. there is no CVA tenderness Musculoskeletal: No lower extremity tenderness, no upper extremity tenderness. No joint effusions, no DVT signs strong distal pulses no edema Neurologic: her a limited neurologic exam, patient can't speak although she sometimes talks in a whisper at yesterday speak up she will speak loudly, no obvious dysarthria and no obvious focal lesions but again,patient very low compliant with exam Skin:  Skin is warm, dry and intact. No rash noted. Psychiatric: Mood and affect are normal. Speech and behavior are normal.  ____________________________________________   LABS (all labs ordered are listed, but only abnormal results are displayed)  Labs Reviewed - No data to display  Pertinent labs  results that were available during my care of the patient were reviewed by  me and considered in my medical decision making (see chart for details). ____________________________________________  EKG  I personally interpreted any EKGs ordered by me or triage sinus rhythm rate  rate 74 bpm, IVCD most likely atypical L, no old for comparison here, ____________________________________________  RADIOLOGY  Pertinent labs & imaging results that were available during my care of the patient were reviewed by me and considered in my medical decision making (see chart for details). If possible, patient and/or family made aware of any abnormal findings. ____________________________________________    PROCEDURES  Procedure(s) performed: None  Procedures  Critical Care performed: None  ____________________________________________   INITIAL IMPRESSION / ASSESSMENT AND PLAN / ED COURSE  Pertinent labs & imaging results that were available during my care of the patient were reviewed by me and  considered in my medical decision making (see chart for details).  patient here with no complaints or chest pain or shortness of breath or anything apparently after gagging. There was some concern that maybe she had a CVA very difficult to patient is disinclinedOr otherwise unableto to to provide better neuro exam CT of the head however is reassuring, I did discuss with Stephanie Coup, [phone], who seems to be the best historian for this patient. We will continue to observe the patient pending urinalysis and we will see how she does here   ----------------------------------------- 4:01 PM on 04/02/2017 ----------------------------------------- family are adamant that the patient probably has had a stroke they feel that her speech is not back to baseline and that there is real difference. are multiple family members in the room HIM feel that she is not better baseline. Psyllium according to TPA as I don't really have a time of onset of this is really a stroke, however given her age and  symptoms including dysphagia andconcern for CVA we will admit.     ____________________________________________   FINAL CLINICAL IMPRESSION(S) / ED DIAGNOSES  Final diagnoses:  None      This chart was dictated using voice recognition software.  Despite best efforts to proofread,  errors can occur which can change meaning.      Schuyler Amor, MD 04/02/17 1521    Schuyler Amor, MD 04/02/17 1521    Schuyler Amor, MD 04/02/17 [phone]

## 2017-04-02 NOTE — ED Notes (Signed)
Awake and alert. NAD 

## 2017-04-02 NOTE — ED Notes (Signed)
Patient denies pain and is resting comfortably.  

## 2017-04-02 NOTE — ED Notes (Signed)
Attempted to call report to floor.  Unsuccessful.

## 2017-04-02 NOTE — Progress Notes (Signed)
Family Meeting Note  Advance Directive:yes  Today a meeting took place with the daughter and grand daughter.  Patient is unable to participate due HQ:[id number] capacity speech problems   The following clinical team members were present during this meeting:MD  The following were discussed:Patient's diagnosis: Dysphagia, TIA, Speech problems , Patient's progosis: Unable to determine and Goals for treatment: Full Code  Additional follow-up to be provided: Stroke work ups and neuro consult.  Time spent during discussion:20 minutes  Alexie Lanni, Rosalio Macadamia, MD

## 2017-04-02 NOTE — ED Notes (Signed)
Attempted to call report

## 2017-04-03 ENCOUNTER — Observation Stay: Payer: Medicare Other

## 2017-04-03 ENCOUNTER — Observation Stay
Admit: 2017-04-03 | Discharge: 2017-04-03 | Disposition: A | Discharge status: Home / Self Care | Payer: Medicare Other | Attending: Internal Medicine | Admitting: Internal Medicine

## 2017-04-03 DIAGNOSIS — R479 Unspecified speech disturbances: Secondary | ICD-10-CM

## 2017-04-03 DIAGNOSIS — I639 Cerebral infarction, unspecified: Secondary | ICD-10-CM

## 2017-04-03 LAB — URINALYSIS, COMPLETE (UACMP) WITH MICROSCOPIC
BACTERIA UA: NONE SEEN
Bilirubin Urine: NEGATIVE
Glucose, UA: NEGATIVE mg/dL
HGB URINE DIPSTICK: NEGATIVE
Ketones, ur: 5 mg/dL — AB
Leukocytes, UA: NEGATIVE
Nitrite: NEGATIVE
PROTEIN: NEGATIVE mg/dL
Specific Gravity, Urine: 1.012 (ref 1.005–1.030)
pH: 6 (ref 5.0–8.0)

## 2017-04-03 LAB — ECHOCARDIOGRAM COMPLETE
HEIGHTINCHES: 60 in
WEIGHTICAEL: 1304 [oz_av]

## 2017-04-03 LAB — BASIC METABOLIC PANEL
Anion gap: 7 (ref 5–15)
BUN: 14 mg/dL (ref 6–20)
CHLORIDE: 107 mmol/L (ref 101–111)
CO2: 26 mmol/L (ref 22–32)
CREATININE: 0.75 mg/dL (ref 0.44–1.00)
Calcium: 9.1 mg/dL (ref 8.9–10.3)
GFR calc Af Amer: >60 mL/min (ref >60)
Glucose, Bld: 106 mg/dL — ABNORMAL HIGH (ref 65–99)
Potassium: 4 mmol/L (ref 3.5–5.1)
SODIUM: 140 mmol/L (ref 135–145)

## 2017-04-03 LAB — CBC
HCT: 34.2 % — ABNORMAL LOW (ref 35.0–47.0)
Hemoglobin: 11.4 g/dL — ABNORMAL LOW (ref 12.0–16.0)
MCH: 28.9 pg (ref 26.0–34.0)
MCHC: 33.3 g/dL (ref 32.0–36.0)
MCV: 86.8 fL (ref 80.0–100.0)
PLATELETS: 176 10*3/uL (ref 150–440)
RBC: 3.94 MIL/uL (ref 3.80–5.20)
RDW: 13.9 % (ref 11.5–14.5)
WBC: 3.6 10*3/uL (ref 3.6–11.0)

## 2017-04-03 LAB — GLUCOSE, CAPILLARY: Glucose-Capillary: 102 mg/dL — ABNORMAL HIGH (ref 65–99)

## 2017-04-03 LAB — HEMOGLOBIN A1C
HEMOGLOBIN A1C: 6.2 % — AB (ref 4.8–5.6)
Mean Plasma Glucose: 131.24 mg/dL

## 2017-04-03 MED ORDER — PNEUMOCOCCAL VAC POLYVALENT 25 MCG/0.5ML IJ INJ: 0.5000 mL | INJECTION | INTRAMUSCULAR | Status: DC

## 2017-04-03 MED ORDER — ONDANSETRON HCL 4 MG/2ML IJ SOLN
4.0000 mg | Freq: Four times a day (QID) | INTRAMUSCULAR | Status: DC | PRN
  Administered 2017-04-03: 10:00:00 4 mg via INTRAVENOUS
  Filled 2017-04-03: qty 2

## 2017-04-03 MED ORDER — ORAL CARE MOUTH RINSE: 15.0000 mL | Freq: Two times a day (BID) | OROMUCOSAL | Status: DC

## 2017-04-03 MED ORDER — ASPIRIN 300 MG RE SUPP
300.0000 mg | Freq: Every day | RECTAL | Status: DC
  Administered 2017-04-03: 300 mg via RECTAL
  Filled 2017-04-03: qty 1

## 2017-04-03 MED ORDER — MORPHINE SULFATE (PF) 2 MG/ML IV SOLN: 2.0000 mg | INTRAVENOUS | Status: DC | PRN

## 2017-04-03 MED ORDER — GADOBENATE DIMEGLUMINE 529 MG/ML IV SOLN
10.0000 mL | Freq: Once | INTRAVENOUS | Status: AC | PRN
  Administered 2017-04-03: 18:00:00 7 mL via INTRAVENOUS

## 2017-04-03 NOTE — Evaluation (Signed)
Physical Therapy Evaluation Patient Details Name: Yolanda Turner MRN: [id number] DOB: 1925/08/31 Today's Date: 04/03/2017   History of Present Illness  83yo female admitted 10/21 to ED with speech problems. PMHx significant for CAD, DM, HTN, CVA, PVD, and uterine cancer. Chart review also indicates hx with trouble swallowing (since last CVA) and often whispers. CT and carotid ultrasound negative for acute injury. Echo and neuro consult pending.  Clinical Impression  Upon evaluation, patient sleeping but arousable to voice/light touch.  Resting position of significant L head/neck rotation requiring significant cuing/assist for correction to/past midline.  Significant hemiparesis of R UE/LE noted with strength/ROM assessment, and appears to have absent sensory awareness R side.  Mild tone noted R biceps, positive associated reactions noted with yawning/movement transitions.  Currently requiring max/total assist for bed mobility and unsupported sitting balance; very limited/no spontaneous righting reactions noted.  No attempts at spontaneous vocalization/speech throughout session; question full comprehension of all verbal information during session (very inconsistent responses and attempts at yes/no). Would benefit from skilled PT to address above deficits and promote optimal return to PLOF; recommend transition to acute inpatient rehab upon discharge for high-intensity, post-acute rehab services to restore optimal functional ability and personal indep.      Follow Up Recommendations CIR    Equipment Recommendations       Recommendations for Other Services       Precautions / Restrictions Precautions Precautions: Fall Precaution Comments: NPO Restrictions Weight Bearing Restrictions: No      Mobility  Bed Mobility Overal bed mobility: Needs Assistance Bed Mobility: Supine to Sit;Sit to Supine     Supine to sit: Max assist;Total assist Sit to supine: Max assist;Total assist    General bed mobility comments: hand-over-hand assist to initiate/complete movement; total assist for position/protection of R hemi-body with movement transition  Transfers                 General transfer comment: unsafe/unable  Ambulation/Gait             General Gait Details: unsafe/unable  Stairs            Wheelchair Mobility    Modified Rankin (Stroke Patients Only)       Balance Overall balance assessment: Needs assistance Sitting-balance support: No upper extremity supported;Feet supported Sitting balance-Leahy Scale: Zero Sitting balance - Comments: R lateral lean with very minimal/no righting reactions, max/total assist to maintain midline in both M/L and A/P planes Postural control: Right lateral lean     Standing balance comment: unsafe/unable                             Pertinent Vitals/Pain Pain Assessment: Faces Faces Pain Scale: No hurt    Home Living Family/patient expects to be discharged to:: Private residence Living Arrangements: Children Available Help at Discharge: Family;Available PRN/intermittently (son, grandson and baby (son has CP); patient is significant assist with caregiving for all family members) Type of Home: Mobile home Home Access: Ramped entrance     Home Layout: One level Home Equipment: Walker - 2 wheels;Cane - single point      Prior Function Level of Independence: Independent         Comments: pt independent with ADL, cooking/cleaning, using a SPC occasionally (for longer, community distances). Pt has never driven. Last fall approx 1 month ago (fell on sacrum, per family report), family states "she won't tell you if she falls" so unsure of additional falls in  past 12 months.     Hand Dominance   Dominant Hand: Right    Extremity/Trunk Assessment   Upper Extremity Assessment RUE Deficits / Details: R UE appears generally flaccid with positive associated reactions during yawning, etc; 1  to 1+/4 tone noted R biceps; appears with absent sensory awareness.  L UE grossly [facility]    Lower Extremity Assessment Lower Extremity Assessment:  (R LE generally flaccid, possible 2-/5 R gluts with fucntional activity, unable to replicate on command.  Appears with absent sensory awareness, negative clonus, + Babinski.  L LE grossly WFL)    Cervical / Trunk Assessment Cervical / Trunk Assessment:  (cervical rotation to L, gaze preference to L; was able to cross midline with both head and eyes, but requires max/dep cuing from therapist (no attempts to initiate spontaneously))  Communication   Communication: No difficulties  Cognition Arousal/Alertness: Lethargic Behavior During Therapy: Flat affect Overall Cognitive Status: Difficult to assess                                 General Comments: inconsistently responds to yes/no; limited initiation for following verbal commands, generally requiring hand-over-hand assist to complete all functional movement/transitions      General Comments      Exercises Other Exercises Other Exercises: Unsupported sitting, worked to promote awareness and correction of balance to midline, max/total assist throughout.  R UE maintained in [place] throughout for proprioceptive input, but patient without awareness of R UE position (despite verbal cuing, placement of L UE on R UE).  Attempted to integrate postural extension activities, visual scannign (at least to midline); difficulty fully comprehending desired tasks.   Assessment/Plan    PT Assessment Patient needs continued PT services  PT Problem List Decreased strength;Decreased range of motion;Decreased activity tolerance;Decreased balance;Decreased mobility;Decreased coordination;Decreased cognition;Decreased knowledge of use of DME;Decreased safety awareness;Decreased knowledge of precautions;Impaired sensation;Impaired tone       PT Treatment Interventions DME instruction;Gait training;Stair  training;Functional mobility training;Therapeutic exercise;Therapeutic activities;Balance training;Neuromuscular re-education;Patient/family education;Cognitive remediation    PT Goals (Current goals can be found in the Care Plan section)  Acute Rehab PT Goals Patient Stated Goal: get better PT Goal Formulation: With family Time For Goal Achievement: 04/17/17 Potential to Achieve Goals: Fair Additional Goals Additional Goal #1: Assess and establish goals for OOB activities as appropriate.    Frequency 7X/week   Barriers to discharge Decreased caregiver support      Co-evaluation               AM-PAC PT "6 Clicks" Daily Activity  Outcome Measure Difficulty turning over in bed (including adjusting bedclothes, sheets and blankets)?: Unable Difficulty moving from lying on back to sitting on the side of the bed? : Unable Difficulty sitting down on and standing up from a chair with arms (e.g., wheelchair, bedside commode, etc,.)?: Unable Help needed moving to and from a bed to chair (including a wheelchair)?: Total Help needed walking in hospital room?: Total Help needed climbing 3-5 steps with a railing? : Total 6 Click Score: 6    End of Session   Activity Tolerance: Patient tolerated treatment well;Patient limited by lethargy Patient left: with call bell/phone within reach;with bed alarm set;in bed;with family/visitor present Nurse Communication: Mobility status PT Visit Diagnosis: Muscle weakness (generalized) (M62.81);Difficulty in walking, not elsewhere classified (R26.2);Hemiplegia and hemiparesis Hemiplegia - Right/Left: Right Hemiplegia - dominant/non-dominant: Dominant Hemiplegia - caused by: Cerebral infarction    Time: 7673-4193  PT Time Calculation (min) (ACUTE ONLY): 21 min   Charges:   PT Evaluation $PT Eval High Complexity: 1 High PT Treatments $Neuromuscular Re-education: 8-22 mins   PT G Codes:   PT G-Codes **NOT FOR INPATIENT CLASS** Functional  Assessment Tool Used: AM-PAC 6 Clicks Basic Mobility Functional Limitation: Mobility: Walking and moving around Mobility: Walking and Moving Around Current Status (K8138): 100 percent impaired, limited or restricted Mobility: Walking and Moving Around Goal Status (I7195): At least 20 percent but less than 40 percent impaired, limited or restricted    Rayden Dock H. Owens Shark, PT, DPT, NCS 04/03/17, 1:55 PM [phone]

## 2017-04-03 NOTE — Progress Notes (Addendum)
Rehab Admissions Coordinator Note:  Patient was screened by Cleatrice Burke for appropriateness for an Inpatient Acute Rehab Admission per PT recommendation. OT recommends SNF. MRI pending.  At this time, we are recommending await further medical workup and progress with therapy before determining rehab venue options. I will follow .  Cleatrice Burke 04/03/2017, 2:32 PM  I can be reached at [phone].

## 2017-04-03 NOTE — Consult Note (Signed)
Referring Physician: Bridgett Larsson    Chief Complaint: Difficulty with swallowing and speech  HPI: Yolanda Turner is an 83 y.o. female with a history of stroke and dysphagia who was at home and choked on water.  Patient was then unable to speak.  EMS was called and patient was brought in for evaluation.  It appears this has happened many time before but when familt finally arrived they felt that this presentation was different.  Speech was more affected. Also feel she has developed some right sided weakness that was not previously present.  Patient admitted at that time.  Initial NIHSS of 10.   Patient making no attempts at speech therefore all history obtained from chart and family not present at onset.    Date last known well: Date: 04/02/2017 Time last known well: Unable to determine tPA Given: No: Unable to determine LKW  Past Medical History:  Diagnosis Date  . Carotid artery stenosis   . Carotid artery stenosis   . Collagen vascular disease ([place])   . Coronary artery disease   . Diabetes mellitus without complication (HCC)    diet control  . GERD (gastroesophageal reflux disease)   . Hypertension   . Myocardial infarction ([place])   . Osteoporosis   . Peripheral vascular disease ([place])   . Pneumonia   . Renal artery stenosis ([place])   . Stroke ([place])   . Uterine cancer [facility])     Past Surgical History:  Procedure Laterality Date  . ABDOMINAL HYSTERECTOMY    . CAROTID ENDARTERECTOMY    . CATARACT EXTRACTION W/PHACO Right 09/28/2015   Procedure: CATARACT EXTRACTION PHACO AND INTRAOCULAR LENS PLACEMENT (IOC);  Surgeon: Estill Cotta, MD;  Location: [facility] ORS;  Service: Ophthalmology;  Laterality: Right;  [place] 041:18 AP% 28.6 CDE 114.94 fluid pack lot # [id number]  . CORONARY ARTERY BYPASS GRAFT    . PERIPHERAL VASCULAR CATHETERIZATION Right 01/22/2015   Procedure: Carotid PTA/Stent Intervention;  Surgeon: Katha Cabal, MD;  Location: [facility];  Service: Cardiovascular;   Laterality: Right;  . uterine cancer surgery      Family History  Problem Relation Age of Onset  . Lung cancer Daughter    Social History:  reports that she has quit smoking. She has never used smokeless tobacco. She reports that she does not drink alcohol or use drugs.  Allergies:  Allergies  Allergen Reactions  . Penicillins Rash    Has patient had a PCN reaction causing immediate rash, facial/tongue/throat swelling, SOB or lightheadedness with hypotension: No Has patient had a PCN reaction causing severe rash involving mucus membranes or skin necrosis: No Has patient had a PCN reaction that required hospitalization: No Has patient had a PCN reaction occurring within the last 10 years: No If all of the above answers are "NO", then may proceed with Cephalosporin use.    Medications:  I have reviewed the patient's current medications. Prior to Admission:  Prescriptions Prior to Admission  Medication Sig Dispense Refill Last Dose  . clopidogrel (PLAVIX) 75 MG tablet Take 75 mg by mouth at bedtime.    04/01/2017 at Unknown time  . gabapentin (NEURONTIN) 300 MG capsule Take 300 mg by mouth at bedtime.    04/01/2017 at Unknown time  . lisinopril (PRINIVIL,ZESTRIL) 10 MG tablet Take 10 mg by mouth at bedtime.   04/02/2017 at Unknown time  . metoprolol succinate (TOPROL-XL) 25 MG 24 hr tablet Take 25 mg by mouth daily. Take with or immediately following a meal.  04/02/2017 at Unknown time  . simvastatin (ZOCOR) 40 MG tablet Take 40 mg by mouth daily at 6 PM.   04/02/2017 at Unknown time  . vitamin B-12 (CYANOCOBALAMIN) 500 MCG tablet Take 500 mcg by mouth daily.   Past Week at Unknown time   Scheduled: . aspirin  300 mg Rectal Daily  . heparin  5,000 Units Subcutaneous Q8H    ROS: Unable to obtain due to speech  Physical Examination: Blood pressure (!) 179/73, pulse 84, temperature 97.9 F (36.6 C), temperature source Oral, resp. rate 16, height 5' (1.524 m), weight 37 kg (81  lb 8 oz), SpO2 96 %. Gen-cachectic HEENT-  Normocephalic, no lesions, without obvious abnormality.  Normal external eye and conjunctiva.  Normal TM's bilaterally.  Normal auditory canals and external ears. Normal external nose, mucus membranes and septum.  Normal pharynx. Cardiovascular- S1, S2 normal, pulses palpable throughout   Lungs- chest clear, no wheezing, rales, normal symmetric air entry Abdomen- soft, non-tender; bowel sounds normal; no masses,  no organomegaly Extremities- no edema Lymph-no adenopathy palpable Musculoskeletal-no joint tenderness, deformity or swelling Skin-warm and dry, no hyperpigmentation, vitiligo, or suspicious lesions  Neurological Examination  (s/p phenergan) Mental Status: Lethargic.  Does alert when lightly stimulated.  Mute.  Follows some simple commands but has difficulty with more specific commands such as put up two fingers.  Will raise entire hand.   Cranial Nerves: II: Discs flat bilaterally; Does not blink to bilateral confrontation.  Pupils irregular III,IV, VI: left ptosist, intact oculocephalic maneuvers V,VII: right facial droop, does not respond to noxious stimuli placed on either side of face VIII: hearing normal bilaterally IX,X: gag reflex reduced XI: unable to test XII: unable to test Motor: Lifts and maintains LUE off the bed.  RUE drifts down to the bed.  Does not lift RLE off the bed.  Able to lift LLE off the bed Sensory: Does not respond to noxious stimuli in any extremity Deep Tendon Reflexes: Increased on the left as compared to the right Plantars: Right: downgoing   Left: upgoing Cerebellar: Unable to perform due to ability to follow commands Gait: not tested due to safety concerns    Laboratory Studies:  Basic Metabolic Panel:  Recent Labs Lab 04/02/17 1424 04/03/17 0423  NA 134* 140  K 4.1 4.0  CL 106 107  CO2 27 26  GLUCOSE 160* 106*  BUN 19 14  CREATININE 0.87 0.75  CALCIUM 9.3 9.1    Liver Function  Tests:  Recent Labs Lab 04/02/17 1424  AST 23  ALT 13*  ALKPHOS 70  BILITOT 0.7  PROT 6.6  ALBUMIN 4.0   No results for input(s): LIPASE, AMYLASE in the last 168 hours. No results for input(s): AMMONIA in the last 168 hours.  CBC:  Recent Labs Lab 04/02/17 1424 04/03/17 0423  WBC 3.8 3.6  NEUTROABS 3.0  --   HGB 11.7* 11.4*  HCT 36.1 34.2*  MCV 87.6 86.8  PLT 173 176    Cardiac Enzymes:  Recent Labs Lab 04/02/17 1424  TROPONINI <0.03    BNP: Invalid input(s): POCBNP  CBG: No results for input(s): GLUCAP in the last 168 hours.  Microbiology: No results found for this or any previous visit.  Coagulation Studies: No results for input(s): LABPROT, INR in the last 72 hours.  Urinalysis:  Recent Labs Lab 04/03/17 0001  COLORURINE YELLOW*  LABSPEC 1.012  PHURINE 6.0  GLUCOSEU NEGATIVE  HGBUR NEGATIVE  BILIRUBINUR NEGATIVE  KETONESUR 5*  PROTEINUR NEGATIVE  NITRITE NEGATIVE  LEUKOCYTESUR NEGATIVE    Lipid Panel:    Component Value Date/Time   CHOL 123 04/02/2017 1424   TRIG 108 04/02/2017 1424   HDL 60 04/02/2017 1424   CHOLHDL 2.1 04/02/2017 1424   VLDL 22 04/02/2017 1424   LDLCALC 41 04/02/2017 1424    HgbA1C:  Lab Results  Component Value Date   HGBA1C 6.2 (H) 04/02/2017    Urine Drug Screen:  No results found for: LABOPIA, COCAINSCRNUR, LABBENZ, AMPHETMU, THCU, LABBARB  Alcohol Level: No results for input(s): ETH in the last 168 hours.   Imaging: Dg Chest 2 View  Result Date: 04/02/2017 CLINICAL DATA:  Choking.  Nausea. EXAM: CHEST  2 VIEW COMPARISON:  August 12, 2014 FINDINGS: Previous CABG with intact sternotomy wires. Mild cardiomegaly. The hila and mediastinum are normal. No pulmonary nodules, masses, or focal infiltrates. IMPRESSION: No active cardiopulmonary disease. Electronically Signed   By: Dorise Bullion III M.D   On: 04/02/2017 13:52   Ct Head Wo Contrast  Result Date: 04/02/2017 CLINICAL DATA:  83 year old female  with altered level of consciousness and headache. EXAM: CT HEAD WITHOUT CONTRAST TECHNIQUE: Contiguous axial images were obtained from the base of the skull through the vertex without intravenous contrast. COMPARISON:  08/13/2014 MR, 08/12/2014 CT and prior studies FINDINGS: Brain: No evidence of acute infarction, hemorrhage, hydrocephalus, extra-axial collection or mass lesion/mass effect. Atrophy, chronic small-vessel white matter ischemic changes and remote right frontal, left occipital, left basal ganglia and left frontoparietal infarcts noted. Vascular: Intracranial atherosclerotic calcifications noted Skull: Normal. Negative for fracture or focal lesion. Sinuses/Orbits: Single opacified posterior left ethmoid air cell again noted. No acute abnormality Other: None IMPRESSION: 1. No evidence of acute intracranial abnormality 2. Atrophy, chronic small-vessel white matter ischemic changes and remote infarcts as described. Electronically Signed   By: Margarette Canada M.D.   On: 04/02/2017 14:10   US Carotid Bilateral  Result Date: 04/02/2017 CLINICAL DATA:  Cerebral infarction. EXAM: BILATERAL CAROTID DUPLEX ULTRASOUND TECHNIQUE: Pearline Cables scale imaging, color Doppler and duplex ultrasound were performed of bilateral carotid and vertebral arteries in the neck. COMPARISON:  August 13, 2014 FINDINGS: Criteria: Quantification of carotid stenosis is based on velocity parameters that correlate the residual internal carotid diameter with NASCET-based stenosis levels, using the diameter of the distal internal carotid lumen as the denominator for stenosis measurement. The following velocity measurements were obtained: RIGHT ICA:  210/42 cm/sec CCA:  18/8 cm/sec SYSTOLIC ICA/CCA RATIO:  3.6 DIASTOLIC ICA/CCA RATIO:  4.8 ECA:  119 cm/sec LEFT ICA:  104/14 cm/sec CCA:  41/6 cm/sec SYSTOLIC ICA/CCA RATIO:  1.4 DIASTOLIC ICA/CCA RATIO:  1.6 ECA:  125 cm/sec RIGHT CAROTID ARTERY: Severe calcified plaque at the right carotid bifurcation  extending into the proximal ICA. There is significant luminal narrowing by grayscale imaging in the proximal ICA on the right. RIGHT VERTEBRAL ARTERY:  Antegrade flow LEFT CAROTID ARTERY: Moderate heterogeneous atherosclerosis and plaque formation on the left with no hemodynamically significant stenosis in the left ICA. LEFT VERTEBRAL ARTERY:  Antegrade flow IMPRESSION: 1. Significant calcified plaque is seen on the right, particularly in the bulb and proximal ICA. Greater than 70% stenosis by velocities and visualization was seen on the August 13, 2014 study. Visually there has been no change on grayscale imaging. However, the velocities suggest a stenosis of between 50 and 69% on today's study. I doubt there has been significant change and the findings are consistent with a hemodynamically significant stenosis. A CTA may better quantify the degree  of stenosis if clinically warranted. 2. Calcified atherosclerosis on the left with left ICA narrowing of less than 50%. Electronically Signed   By: Dorise Bullion III M.D   On: 04/02/2017 19:07    Assessment: 83 y.o. female presenting with swallowing and speech difficulties.  Also with some right sided weakness noted as well.  Per family this is new.  Acute infarct suspected.  Head CT reviewed and shows no acute changes.  Carotid dopplers show >70% RICA stenosis and no hemodynamically significant stenosis on the left.  RICA stenosis known.  Patient s/p left CEA.  Echocardiogram and MRI pending.  A1c 6.2.  LDL 41.  Patient on a statin. Patient on Plavix at home.    Stroke Risk Factors - carotid stenosis, diabetes mellitus and hypertension  Plan: 1. MRI, MRA  of the brain without contrast pending 2. PT consult, OT consult, Speech consult 3. Echocardiogram pending 4. Prophylactic therapy-Antiplatelet med: Aspirin - dose 300mg  rectally, daily 5. NPO until RN stroke swallow screen 6. Telemetry monitoring 7. Frequent neuro checks   Alexis Goodell,  MD Neurology [phone] 04/03/2017, 1:11 PM

## 2017-04-03 NOTE — Progress Notes (Signed)
Initial Nutrition Assessment  DOCUMENTATION CODES:   Severe malnutrition in context of chronic illness, Underweight  INTERVENTION:  Will monitor for diet advancement.  Recommend Magic cup TID with diet advancement as patient does not like oral nutrition supplements. Each supplement provides 290 kcal and 9 grams of protein.  Also recommend daily multivitamin with minerals with diet advancement.  NUTRITION DIAGNOSIS:   Malnutrition (Severe) related to chronic illness (hx CVA) as evidenced by severe depletion of body fat, severe depletion of muscle mass.  GOAL:   Patient will meet greater than or equal to 90% of their needs  MONITOR:   PO intake, Supplement acceptance, Diet advancement, Labs, Weight trends, I & O's  REASON FOR ASSESSMENT:   Other (Comment) (Low BMI)    ASSESSMENT:   83 year old female with PMHx of peripheral vascular disease, carotid artery stenosis, hx of MI, HTN, hx CVA, GERD, DM type 2, osteoporosis, renal artery stenosis presenting with swallowing and speech difficulties, right-sided weakness admitted for work-up.   -Pending SLP evaluation.  Spoke with one of patient's daughters at bedside. Patient unable to speak at this time. Daughter reports patient has not been eating well for a while now. Her physician, Dr. Hall Busing has tried to get her to eat better and drink oral nutrition supplements such as Boost or Ensure, but patient has been unable to do so and dislikes the supplements. The only meal she really eats each day is dinner, but even that meal is fairly small per daughter's report.   Daughter reports patient has always been small and is at her UBW. Per review of chart weight has been 76-80 lbs since 2016.  Medications reviewed and include: NS @ 50 ml/hr.  Labs reviewed and none pertinent.  Nutrition-Focused physical exam completed. Findings are severe fat depletion, severe muscle depletion.   Discussed with RN.  Diet Order:  Diet NPO time  specified  Skin:  Reviewed, no issues  Last BM:  Unknown  Height:   Ht Readings from Last 1 Encounters:  04/02/17 5' (1.524 m)    Weight:   Wt Readings from Last 1 Encounters:  04/02/17 81 lb 8 oz (37 kg)    Ideal Body Weight:  45.5 kg  BMI:  Body mass index is 15.92 kg/m.  Estimated Nutritional Needs:   Kcal:  1300-1480 (35-40 kcal/kg)  Protein:  55-66 grams (1.5-1.8 grams/kg)  Fluid:  1.1 L/day (30 ml/kg)  EDUCATION NEEDS:   Education needs no appropriate at this time  Willey Blade, [place], [place], [place] Office: [phone] Pager: [phone] After Hours/Weekend Pager: [phone]

## 2017-04-03 NOTE — Progress Notes (Signed)
*  PRELIMINARY RESULTS* Echocardiogram 2D Echocardiogram has been performed.  Sherrie Sport 04/03/2017, 9:10 AM

## 2017-04-03 NOTE — Progress Notes (Addendum)
[place] [facility] [place] NAME: Yolanda Turner    MR#:  [id number]  DATE OF BIRTH:  1925/07/03  SUBJECTIVE:  CHIEF COMPLAINT:   Chief Complaint  Patient presents with  . Choking   The patient is a drowsy and lethargic, unable to answer questions or follow Commands. Per RN, the patient was given Phenergan and became drowsy. REVIEW OF SYSTEMS:  Review of Systems  Unable to perform ROS: Mental status change    DRUG ALLERGIES:   Allergies  Allergen Reactions  . Penicillins Rash    Has patient had a PCN reaction causing immediate rash, facial/tongue/throat swelling, SOB or lightheadedness with hypotension: No Has patient had a PCN reaction causing severe rash involving mucus membranes or skin necrosis: No Has patient had a PCN reaction that required hospitalization: No Has patient had a PCN reaction occurring within the last 10 years: No If all of the above answers are "NO", then may proceed with Cephalosporin use.   VITALS:  Blood pressure (!) 168/76, pulse 84, temperature 97.6 F (36.4 C), temperature source Oral, resp. rate 16, height 5' (1.524 m), weight 81 lb 8 oz (37 kg), SpO2 98 %. PHYSICAL EXAMINATION:  Physical Exam  Constitutional:  Severe malnutrition.  HENT:  Head: Normocephalic.  Mouth/Throat: Oropharynx is clear and moist.  Eyes: Pupils are equal, round, and reactive to light. Conjunctivae and EOM are normal. No scleral icterus.  Neck: Normal range of motion. Neck supple. No JVD present. No tracheal deviation present.  Cardiovascular: Normal rate, regular rhythm and normal heart sounds.  Exam reveals no gallop.   No murmur heard. Pulmonary/Chest: Effort normal and breath sounds normal. No respiratory distress. She has no wheezes. She has no rales.  Abdominal: Soft. Bowel sounds are normal. She exhibits no distension. There is no tenderness. There is no rebound.  Musculoskeletal: Normal range of motion. She exhibits no edema  or tenderness.  Neurological: No cranial nerve deficit.  Unable to exam.  Skin: No rash noted. No erythema.  Psychiatric:  The patient is drowsy and lethargic   LABORATORY PANEL:  Female CBC  Recent Labs Lab 04/03/17 0423  WBC 3.6  HGB 11.4*  HCT 34.2*  PLT 176   ------------------------------------------------------------------------------------------------------------------ Chemistries   Recent Labs Lab 04/02/17 1424 04/03/17 0423  NA 134* 140  K 4.1 4.0  CL 106 107  CO2 27 26  GLUCOSE 160* 106*  BUN 19 14  CREATININE 0.87 0.75  CALCIUM 9.3 9.1  AST 23  --   ALT 13*  --   ALKPHOS 70  --   BILITOT 0.7  --    RADIOLOGY:  US Carotid Bilateral  Result Date: 04/02/2017 CLINICAL DATA:  Cerebral infarction. EXAM: BILATERAL CAROTID DUPLEX ULTRASOUND TECHNIQUE: Pearline Cables scale imaging, color Doppler and duplex ultrasound were performed of bilateral carotid and vertebral arteries in the neck. COMPARISON:  August 13, 2014 FINDINGS: Criteria: Quantification of carotid stenosis is based on velocity parameters that correlate the residual internal carotid diameter with NASCET-based stenosis levels, using the diameter of the distal internal carotid lumen as the denominator for stenosis measurement. The following velocity measurements were obtained: RIGHT ICA:  210/42 cm/sec CCA:  98/9 cm/sec SYSTOLIC ICA/CCA RATIO:  3.6 DIASTOLIC ICA/CCA RATIO:  4.8 ECA:  119 cm/sec LEFT ICA:  104/14 cm/sec CCA:  21/1 cm/sec SYSTOLIC ICA/CCA RATIO:  1.4 DIASTOLIC ICA/CCA RATIO:  1.6 ECA:  125 cm/sec RIGHT CAROTID ARTERY: Severe calcified plaque at the right carotid bifurcation extending into  the proximal ICA. There is significant luminal narrowing by grayscale imaging in the proximal ICA on the right. RIGHT VERTEBRAL ARTERY:  Antegrade flow LEFT CAROTID ARTERY: Moderate heterogeneous atherosclerosis and plaque formation on the left with no hemodynamically significant stenosis in the left ICA. LEFT VERTEBRAL  ARTERY:  Antegrade flow IMPRESSION: 1. Significant calcified plaque is seen on the right, particularly in the bulb and proximal ICA. Greater than 70% stenosis by velocities and visualization was seen on the August 13, 2014 study. Visually there has been no change on grayscale imaging. However, the velocities suggest a stenosis of between 50 and 69% on today's study. I doubt there has been significant change and the findings are consistent with a hemodynamically significant stenosis. A CTA may better quantify the degree of stenosis if clinically warranted. 2. Calcified atherosclerosis on the left with left ICA narrowing of less than 50%. Electronically Signed   By: Dorise Bullion III M.D   On: 04/02/2017 19:07   ASSESSMENT AND PLAN:   * Speech problem with dysphagia    suspect  TIA Vs stroke MRI, MRA  of the brain without contrast pending, echo is pending.  Nothing by mouth with IV fluid support, follow-up speech study. Aspirin - dose 300mg  rectally, daily. NPO w/ frequent oral care per speech study PT evaluation suggested CIR.  Acute altered mental status. Possible due to medication. Aspiration and fall precaution.  * Hypertension Hold hypertension by mouth medication, allow permissive blood pressure.  * HLP   Hold meds,   * CAD    Hold oral meds as dysphagia  Severe malnutrition. Dietitian consult.     Discussed with Dr. Doy Mince. All the records are reviewed and case discussed with Care Management/Social Worker. Management plans discussed with the patient, family and they are in agreement.  CODE STATUS: Full Code  TOTAL TIME TAKING CARE OF THIS PATIENT: 35  minutes.   More than 50% of the time was spent in counseling/coordination of care: YES  POSSIBLE D/C IN 1-2 DAYS, DEPENDING ON CLINICAL CONDITION.   Demetrios Loll M.D on 04/03/2017 at 5:10 PM  Between 7am to 6pm - Pager - [phone]  After 6pm go to [email or web address] - Patent attorney  Hospitalists

## 2017-04-03 NOTE — Evaluation (Signed)
Clinical/Bedside Swallow Evaluation Patient Details  Name: Yolanda Turner MRN: [id number] Date of Birth: 1925/11/12  Today's Date: 04/03/2017 Time: SLP Start Time (ACUTE ONLY): 55 SLP Stop Time (ACUTE ONLY): 1501 SLP Time Calculation (min) (ACUTE ONLY): 60 min  Past Medical History:  Past Medical History:  Diagnosis Date  . Carotid artery stenosis   . Carotid artery stenosis   . Collagen vascular disease ([place])   . Coronary artery disease   . Diabetes mellitus without complication (HCC)    diet control  . GERD (gastroesophageal reflux disease)   . Hypertension   . Myocardial infarction ([place])   . Osteoporosis   . Peripheral vascular disease ([place])   . Pneumonia   . Renal artery stenosis ([place])   . Stroke ([place])   . Uterine cancer [facility])    Past Surgical History:  Past Surgical History:  Procedure Laterality Date  . ABDOMINAL HYSTERECTOMY    . CAROTID ENDARTERECTOMY    . CATARACT EXTRACTION W/PHACO Right 09/28/2015   Procedure: CATARACT EXTRACTION PHACO AND INTRAOCULAR LENS PLACEMENT (IOC);  Surgeon: Estill Cotta, MD;  Location: [facility] ORS;  Service: Ophthalmology;  Laterality: Right;  [place] 041:18 AP% 28.6 CDE 114.94 fluid pack lot # [id number]  . CORONARY ARTERY BYPASS GRAFT    . PERIPHERAL VASCULAR CATHETERIZATION Right 01/22/2015   Procedure: Carotid PTA/Stent Intervention;  Surgeon: Katha Cabal, MD;  Location: [facility];  Service: Cardiovascular;  Laterality: Right;  . uterine cancer surgery     HPI:   Pt is a 83 y.o. female with a known history of CAD, DM, Htn, Stroke, GERD- lives with daughter- walks with a cane, some memory issues, but able to walk and take care of herself. Yesterday morning was fine, but later noted to have speech problem and noted chocking on liquids. She also complained to her daughter about pain in left leg and did not walk since the episode. Pt is frustrated as per family as she can not speak. Noted pt tended to remain in a fetal  position on her Left side.   Assessment / Plan / Recommendation Clinical Impression  Pt appears to present w/ oropharyngeal phase Dysphagia d/t pt's inattention to task and desire to "shut down", resulting in an increased risk for aspiration. Pt was not communicative verbally - used head nods to answer yes/no questions(accuracy?). Pt's daughter and family were present during eval.  Pt was given trials of ice chips and puree during this evaluation. ST used max verbal/tactile stimulation to get pt's attention at the beginning of each trial. Pt closed eyes frequently during evaluation and at times would lose attention to task of eating requiring ST to provide moderate verbal/tactile stimulation throughout the eval. Pt was given 3 trials of ice chips, and had mild throat clear 1x - could be d/t pt losing attention to task and losing control of the bolus. Throat clearing did not increase in intensity or frequency for the remainder of the evaluation. Pt was not overly engaged in the task, impacting pt's bolus management. Pt was given 3 trials of puree and exhibited a timely swallow w/ adequate oral clearing. No coughing or throat clearing was noted given trials of puree. No decline in respiratory status or vocal quality were noted. Total assist was required. Of note, pt appeared to present w/ R labial weakness; decreased tone. Will f/u to assess further when pt is able to engage more in assessment. D/t pt's current presentation and inattention to task, recommend NPO at this time  w/ pleasure teaspoons of applesauce and meds crushed in applesauce. Total assist during feeding.  ST services will continue to f/u w/ pt's status, trials to upgrade as appropriate, and education while admitted. NSG/MD updated.   SLP Visit Diagnosis: Dysphagia, oropharyngeal phase (R13.12)    Aspiration Risk  Moderate aspiration risk    Diet Recommendation   NPO w/ frequent oral care for hygiene and oral stimulation   Medication  Administration: Crushed with puree    Other  Recommendations Oral Care Recommendations: Oral care QID;Staff/trained caregiver to provide oral care   Follow up Recommendations  (TBD)      Frequency and Duration min 2x/week  2 weeks       Prognosis Prognosis for Safe Diet Advancement: Guarded Barriers to Reach Goals: Severity of deficits      Swallow Study   General Date of Onset: 04/02/17 HPI:  Pt is a 83 y.o. female with a known history of CAD, DM, Htn, Stroke, GERD- lives with daughter- walks with a cane, some memory issues, but able to walk and take care of herself. Yesterday morning was fine, but later noted to have speech problem and noted chocking on liquids. She also complained to her daughter about pain in left leg and did not walk since the episode. Pt is frustrated as per family as she can not speak. Type of Study: Bedside Swallow Evaluation Previous Swallow Assessment: none reported Diet Prior to this Study: NPO Temperature Spikes Noted: No (wbc WFL) Respiratory Status: Room air History of Recent Intubation: No Behavior/Cognition: Cooperative;Pleasant mood;Confused;Requires cueing (inattentive to task; needs tactile/verbal stim) Oral Cavity Assessment: Within Functional Limits Oral Care Completed by SLP: Recent completion by staff Oral Cavity - Dentition: Edentulous Vision: Functional for self-feeding Self-Feeding Abilities: Total assist Patient Positioning: Upright in bed Baseline Vocal Quality: Low vocal intensity Volitional Cough: Strong Volitional Swallow: Able to elicit    Oral/Motor/Sensory Function Overall Oral Motor/Sensory Function: Within functional limits (for bolus management ) but noted R labial decreased tone   Ice Chips Ice chips: Impaired Presentation: Spoon Oral Phase Impairments: Poor awareness of bolus (throat clear 1x) Oral Phase Functional Implications: Prolonged oral transit (min) Pharyngeal Phase Impairments: Throat Clearing - Immediate (1x;  min)   Thin Liquid Thin Liquid: Not tested    Nectar Thick Nectar Thick Liquid: Not tested   Honey Thick Honey Thick Liquid: Not tested   Puree Puree: Within functional limits Presentation: Spoon Other Comments: 3 trials   Solid   GO   Solid: Not tested       Carolynn Sayers, SLP-Graduate Student Carolynn Sayers 04/03/2017,3:43 PM   This information has been reviewed and agreed upon by this supervising clinician.  This patient note, response to treatment and overall treatment plan has been reviewed and this clinician agrees with the information provided.  04/04/17, 3:08 PM [place], [place], CCC-SLP

## 2017-04-03 NOTE — Care Management Note (Signed)
Case Management Note  Patient Details  Name: MARCELLINA JONSSON MRN: [id number] Date of Birth: 1925-12-11  Subjective/Objective:  Admitted to [facility] with the diagnosis of speech problems under observation status. Lives with daughter, Christie Beckers. Daughter, [name] [phone]).  Cancelled last appointment with Dr. Hall Busing. Prescriptions are filled at [facility] in [place]. No home Health. No skilled facility. No home oxygen. Rolling walker and cane in the home. Takes care of all basic activities of daily living, never has driven. Last fall was a month ago. Good appetite. Family will transport.                Action/Plan: Physical therapy evaluation pending   Expected Discharge Date:  04/04/17               Expected Discharge Plan:     In-House Referral:     Discharge planning Services     Post Acute Care Choice:    Choice offered to:     DME Arranged:    DME Agency:     HH Arranged:    HH Agency:     Status of Service:     If discussed at [employer], dates discussed:    Additional Comments:  Shelbie Ammons, RN MSN CCM Care Management [phone] 04/03/2017, 9:00 AM

## 2017-04-03 NOTE — Care Management Obs Status (Signed)
[place] NOTIFICATION   Patient Details  Name: Yolanda Turner MRN: [id number] Date of Birth: 1925-11-04   Medicare Observation Status Notification Given:  Yes    Shelbie Ammons, RN 04/03/2017, 9:00 AM

## 2017-04-03 NOTE — Evaluation (Addendum)
Occupational Therapy Evaluation Patient Details Name: Yolanda Turner MRN: [id number] DOB: Sep 07, 1925 Today's Date: 04/03/2017    History of Present Illness 83yo female admitted 10/21 to ED with speech problems. PMHx significant for CAD, DM, HTN, CVA, PVD, and uterine cancer. Chart review also indicates hx with trouble swallowing (since last CVA) and often whispers. CT and carotid ultrasound negative for acute CVA. Echo and neuro consult pending.   Clinical Impression   Pt seen for OT evaluation this date. Pt was independent with ADL, IADL excluding driving at baseline, occasionally using a SPC for ambulation. Pt lives in a 1 story home with ramped side entrance and son, grand daughter, and great granddaughter living with her (two family members have CP, limited physical assist available). Family endorses at least 1 fall in past 12 months (last month, pt fell on sacrum per family report), but note that pt "won't tell you if she did fall." Pt presents with deficits in strength (R worse than L), possible R facial droop, intact sensation. Vision difficult to assess, as pt kept her eyes closed most of the time but with encouragement would open them briefly. Initially nodded when asked separately about dizziness, blurry vision, and double vision, but later shook her head. When asked, pt noted lights were bothering her and nodded that it was better after OT turned lights down. Mobility deferred, as RN in room to further assess. Based on independent PLOF and current functional status, recommend STR following hospitalization. Will continue to assess next date for appropriateness of [place] services.     Follow Up Recommendations  SNF    Equipment Recommendations  None recommended by OT    Recommendations for Other Services       Precautions / Restrictions Precautions Precautions: Fall Restrictions Weight Bearing Restrictions: No      Mobility Bed Mobility               General bed mobility  comments: deferred due to cognition/safety and RN in room to further assess pt  Transfers                 General transfer comment: deferred due to cognition/safety and RN in room to further assess pt    Balance                                           ADL either performed or assessed with clinical judgement   ADL Overall ADL's : Needs assistance/impaired Eating/Feeding: NPO   Grooming: Bed level;Set up;Wash/dry face Grooming Details (indicate cue type and reason): cues to participate Upper Body Bathing: Bed level;Moderate assistance   Lower Body Bathing: Bed level;Maximal assistance   Upper Body Dressing : Bed level;Moderate assistance   Lower Body Dressing: Bed level;Maximal assistance     Toilet Transfer Details (indicate cue type and reason): unsafe to attempt           General ADL Comments: pt grossly max assist for LB ADL this date     Vision Baseline Vision/History: Wears glasses Wears Glasses: Reading only Patient Visual Report: Other (comment) (difficult to assess, pt nods her head when asked if she is dizzy, has blurry vision, and double vision, but later shakes head "no", keeps eyes closed during most of assessment. When asked, pt nodded that lights were bothering her) Vision Assessment?: Vision impaired- to be further tested in functional context (difficult to assess  due to cognition, will continue to assess in functional context)     Perception     Praxis      Pertinent Vitals/Pain Pain Assessment:  (pt nods her head when asked if she is in pain, rubs her abdomen when asked to point where, RN in room unable to determine where/type/how much pain)     Hand Dominance Right   Extremity/Trunk Assessment Upper Extremity Assessment Upper Extremity Assessment: RUE deficits/detail;Difficult to assess due to impaired cognition (LUE at least 4-/5) RUE Deficits / Details: at least 3+/5 spontaneously to cover eyes; when tested, unable to  maintain shoulder flexion, poor grip, unclear if all movement was volitional; intact sensation per pt nodding RUE Coordination: decreased fine motor;decreased gross motor   Lower Extremity Assessment Lower Extremity Assessment: Defer to PT evaluation;Generalized weakness (at least 4-/5 bilaterally)       Communication Communication Communication: No difficulties   Cognition Arousal/Alertness: Lethargic (would open eyes briefly when requested, otherwise kept eyes closed) Behavior During Therapy: Flat affect (seemed bothered by staff in room asking questions, seemed to be slightly agitated by lights and sounds) Overall Cognitive Status: Difficult to assess Area of Impairment: Following commands                       Following Commands: Follows one step commands consistently (with gentle encouragement/cues)       General Comments: pt appropriate with head nods/shakes when asked simple questions, broadly able to follow simple commands with encouragement; difficult to assess due to impaired communication   General Comments       Exercises     Shoulder Instructions      Home Living Family/patient expects to be discharged to:: Private residence Living Arrangements: Children (son, granddaughter, and great granddaughter live with pt) Available Help at Discharge: Family;Available PRN/intermittently (son and great granddaughter have CP, limited physical assist available) Type of Home: Mobile home Home Access: Ramped entrance     Home Layout: One level     Bathroom Shower/Tub: Walk-in Hydrologist: Standard     Home Equipment: Environmental consultant - 2 wheels;Cane - single point          Prior Functioning/Environment Level of Independence: Independent        Comments: pt independent with ADL, cooking/cleaning, using a SPC occasionally. Pt has never driven. Last fall approx 1 month ago (fell on sacrum, per family report), family states "she won't tell you if  she falls" so unsure of additional falls in past 12 months.        OT Problem List: Decreased strength;Decreased coordination;Decreased cognition;Decreased safety awareness;Decreased activity tolerance;Impaired balance (sitting and/or standing);Impaired vision/perception;Impaired UE functional use;Decreased knowledge of use of DME or AE;Decreased knowledge of precautions      OT Treatment/Interventions: Self-care/ADL training;Therapeutic exercise;Therapeutic activities;Neuromuscular education;DME and/or AE instruction;Patient/family education;Visual/perceptual remediation/compensation;Cognitive remediation/compensation;Energy conservation;Balance training    OT Goals(Current goals can be found in the care plan section) Acute Rehab OT Goals Patient Stated Goal: get better OT Goal Formulation: With family Time For Goal Achievement: 04/17/17 Potential to Achieve Goals: Fair ADL Goals Pt Will Perform Lower Body Dressing: with mod assist;with adaptive equipment;sitting/lateral leans Pt Will Transfer to Toilet: with min assist;stand pivot transfer;bedside commode  OT Frequency: Min 2X/week   Barriers to D/C: Decreased caregiver support          Co-evaluation              AM-PAC PT "6 Clicks" Daily Activity  Outcome Measure Help from another person eating meals?: Total (NPO) Help from another person taking care of personal grooming?: A Little Help from another person toileting, which includes using toliet, bedpan, or urinal?: A Lot Help from another person bathing (including washing, rinsing, drying)?: A Lot Help from another person to put on and taking off regular upper body clothing?: A Lot Help from another person to put on and taking off regular lower body clothing?: A Lot 6 Click Score: 12   End of Session    Activity Tolerance: Other (comment) (fatigue/lethargy/cognition/agitation) Patient left: in bed;with call bell/phone within reach;with bed alarm set;with  nursing/sitter in room;with family/visitor present  OT Visit Diagnosis: Other abnormalities of gait and mobility (R26.89);Other symptoms and signs involving cognitive function;Cognitive communication deficit (R41.841)                Time: 7915-0569 OT Time Calculation (min): 24 min Charges:  OT General Charges $OT Visit: 1 Visit OT Evaluation $OT Eval Moderate Complexity: 1 Mod G-Codes: OT G-codes **NOT FOR INPATIENT CLASS** Functional Assessment Tool Used: AM-PAC 6 Clicks Daily Activity;Clinical judgement Functional Limitation: Self care Self Care Current Status (V9480): At least 60 percent but less than 80 percent impaired, limited or restricted Self Care Goal Status (X6553): At least 40 percent but less than 60 percent impaired, limited or restricted   Jeni Salles, MPH, MS, OTR/L ascom [phone] 04/03/17, 10:06 AM

## 2017-04-04 DIAGNOSIS — Z8673 Personal history of transient ischemic attack (TIA), and cerebral infarction without residual deficits: Secondary | ICD-10-CM | POA: Diagnosis not present

## 2017-04-04 DIAGNOSIS — M810 Age-related osteoporosis without current pathological fracture: Secondary | ICD-10-CM | POA: Diagnosis present

## 2017-04-04 DIAGNOSIS — R4701 Aphasia: Secondary | ICD-10-CM | POA: Diagnosis present

## 2017-04-04 DIAGNOSIS — Z7902 Long term (current) use of antithrombotics/antiplatelets: Secondary | ICD-10-CM | POA: Diagnosis not present

## 2017-04-04 DIAGNOSIS — R1310 Dysphagia, unspecified: Secondary | ICD-10-CM | POA: Diagnosis present

## 2017-04-04 DIAGNOSIS — I10 Essential (primary) hypertension: Secondary | ICD-10-CM | POA: Diagnosis present

## 2017-04-04 DIAGNOSIS — E43 Unspecified severe protein-calorie malnutrition: Secondary | ICD-10-CM | POA: Diagnosis present

## 2017-04-04 DIAGNOSIS — I639 Cerebral infarction, unspecified: Secondary | ICD-10-CM | POA: Diagnosis present

## 2017-04-04 DIAGNOSIS — Z951 Presence of aortocoronary bypass graft: Secondary | ICD-10-CM | POA: Diagnosis not present

## 2017-04-04 DIAGNOSIS — G8191 Hemiplegia, unspecified affecting right dominant side: Secondary | ICD-10-CM | POA: Diagnosis present

## 2017-04-04 DIAGNOSIS — Z515 Encounter for palliative care: Secondary | ICD-10-CM | POA: Diagnosis not present

## 2017-04-04 DIAGNOSIS — Z880 Allergy status to penicillin: Secondary | ICD-10-CM | POA: Diagnosis not present

## 2017-04-04 DIAGNOSIS — Z66 Do not resuscitate: Secondary | ICD-10-CM | POA: Diagnosis not present

## 2017-04-04 DIAGNOSIS — I252 Old myocardial infarction: Secondary | ICD-10-CM | POA: Diagnosis not present

## 2017-04-04 DIAGNOSIS — Z8542 Personal history of malignant neoplasm of other parts of uterus: Secondary | ICD-10-CM | POA: Diagnosis not present

## 2017-04-04 DIAGNOSIS — E785 Hyperlipidemia, unspecified: Secondary | ICD-10-CM | POA: Diagnosis present

## 2017-04-04 DIAGNOSIS — R29710 NIHSS score 10: Secondary | ICD-10-CM | POA: Diagnosis present

## 2017-04-04 DIAGNOSIS — E1151 Type 2 diabetes mellitus with diabetic peripheral angiopathy without gangrene: Secondary | ICD-10-CM | POA: Diagnosis present

## 2017-04-04 DIAGNOSIS — Z681 Body mass index (BMI) 19 or less, adult: Secondary | ICD-10-CM | POA: Diagnosis not present

## 2017-04-04 DIAGNOSIS — I2510 Atherosclerotic heart disease of native coronary artery without angina pectoris: Secondary | ICD-10-CM | POA: Diagnosis present

## 2017-04-04 DIAGNOSIS — R4182 Altered mental status, unspecified: Secondary | ICD-10-CM | POA: Diagnosis present

## 2017-04-04 DIAGNOSIS — I63512 Cerebral infarction due to unspecified occlusion or stenosis of left middle cerebral artery: Secondary | ICD-10-CM | POA: Diagnosis present

## 2017-04-04 DIAGNOSIS — Z801 Family history of malignant neoplasm of trachea, bronchus and lung: Secondary | ICD-10-CM | POA: Diagnosis not present

## 2017-04-04 DIAGNOSIS — Z87891 Personal history of nicotine dependence: Secondary | ICD-10-CM | POA: Diagnosis not present

## 2017-04-04 MED ORDER — LORAZEPAM 2 MG/ML PO CONC: 1.0000 mg | ORAL | Status: DC | PRN

## 2017-04-04 MED ORDER — ONDANSETRON HCL 4 MG/2ML IJ SOLN: 4.0000 mg | Freq: Four times a day (QID) | INTRAMUSCULAR | Status: DC | PRN

## 2017-04-04 MED ORDER — POLYVINYL ALCOHOL 1.4 % OP SOLN: 1.0000 [drp] | Freq: Four times a day (QID) | OPHTHALMIC | 0 refills | Status: DC | PRN

## 2017-04-04 MED ORDER — ASPIRIN EC 81 MG PO TBEC
81.0000 mg | DELAYED_RELEASE_TABLET | Freq: Every day | ORAL | Status: DC
  Filled 2017-04-04: qty 1

## 2017-04-04 MED ORDER — CLOPIDOGREL BISULFATE 75 MG PO TABS
75.0000 mg | ORAL_TABLET | Freq: Every day | ORAL | Status: DC
  Filled 2017-04-04: qty 1

## 2017-04-04 MED ORDER — GLYCOPYRROLATE 0.2 MG/ML IJ SOLN
0.2000 mg | INTRAMUSCULAR | Status: DC | PRN
  Filled 2017-04-04: qty 1

## 2017-04-04 MED ORDER — GLYCOPYRROLATE 0.2 MG/ML IJ SOLN: 0.2000 mg | INTRAMUSCULAR | Status: DC | PRN

## 2017-04-04 MED ORDER — HALOPERIDOL 0.5 MG PO TABS
0.5000 mg | ORAL_TABLET | ORAL | Status: DC | PRN
  Filled 2017-04-04: qty 1

## 2017-04-04 MED ORDER — HALOPERIDOL LACTATE 5 MG/ML IJ SOLN: 0.5000 mg | INTRAMUSCULAR | Status: DC | PRN

## 2017-04-04 MED ORDER — GABAPENTIN 250 MG/5ML PO SOLN: 100.0000 mg | Freq: Three times a day (TID) | ORAL | 0 refills | Status: DC

## 2017-04-04 MED ORDER — BIOTENE DRY MOUTH MT LIQD: 15.0000 mL | OROMUCOSAL | Status: DC | PRN

## 2017-04-04 MED ORDER — ASPIRIN EC 81 MG PO TBEC: 81.0000 mg | DELAYED_RELEASE_TABLET | Freq: Every day | ORAL | Status: DC

## 2017-04-04 MED ORDER — SODIUM CHLORIDE 0.9% FLUSH: 3.0000 mL | INTRAVENOUS | Status: DC | PRN

## 2017-04-04 MED ORDER — HALOPERIDOL LACTATE 2 MG/ML PO CONC
0.5000 mg | ORAL | Status: DC | PRN
Start: 2017-04-04 — End: 2017-04-04
  Filled 2017-04-04: qty 0.3

## 2017-04-04 MED ORDER — ACETAMINOPHEN 325 MG PO TABS: 650.0000 mg | ORAL_TABLET | Freq: Four times a day (QID) | ORAL | Status: DC | PRN

## 2017-04-04 MED ORDER — ADULT MULTIVITAMIN W/MINERALS CH: 1.0000 | ORAL_TABLET | Freq: Every day | ORAL | Status: DC

## 2017-04-04 MED ORDER — HALOPERIDOL 0.5 MG PO TABS: 0.5000 mg | ORAL_TABLET | ORAL | Status: DC | PRN

## 2017-04-04 MED ORDER — LORAZEPAM 2 MG/ML IJ SOLN: 1.0000 mg | INTRAMUSCULAR | 0 refills | Status: DC | PRN

## 2017-04-04 MED ORDER — LORAZEPAM 2 MG/ML PO CONC: 1.0000 mg | ORAL | 0 refills | Status: DC | PRN

## 2017-04-04 MED ORDER — ONDANSETRON 4 MG PO TBDP
4.0000 mg | ORAL_TABLET | Freq: Four times a day (QID) | ORAL | Status: DC | PRN
  Filled 2017-04-04: qty 1

## 2017-04-04 MED ORDER — MORPHINE SULFATE (PF) 2 MG/ML IV SOLN: 2.0000 mg | INTRAVENOUS | Status: DC | PRN

## 2017-04-04 MED ORDER — PROMETHAZINE HCL 12.5 MG PO TABS
12.5000 mg | ORAL_TABLET | Freq: Four times a day (QID) | ORAL | Status: DC | PRN
  Filled 2017-04-04: qty 1

## 2017-04-04 MED ORDER — LORAZEPAM 2 MG/ML IJ SOLN: 1.0000 mg | INTRAMUSCULAR | Status: DC | PRN

## 2017-04-04 MED ORDER — GLYCOPYRROLATE 1 MG PO TABS
1.0000 mg | ORAL_TABLET | ORAL | Status: DC | PRN
  Filled 2017-04-04: qty 1

## 2017-04-04 MED ORDER — LORAZEPAM 1 MG PO TABS: 1.0000 mg | ORAL_TABLET | ORAL | Status: DC | PRN

## 2017-04-04 MED ORDER — SODIUM CHLORIDE 0.9% FLUSH: 3.0000 mL | Freq: Two times a day (BID) | INTRAVENOUS | Status: DC

## 2017-04-04 MED ORDER — LORAZEPAM 1 MG PO TABS: 1.0000 mg | ORAL_TABLET | ORAL | 0 refills | Status: DC | PRN

## 2017-04-04 MED ORDER — GABAPENTIN 250 MG/5ML PO SOLN
100.0000 mg | Freq: Three times a day (TID) | ORAL | Status: DC
  Filled 2017-04-04 (×4): qty 2

## 2017-04-04 MED ORDER — GLYCOPYRROLATE 1 MG PO TABS: 1.0000 mg | ORAL_TABLET | ORAL | Status: DC | PRN

## 2017-04-04 MED ORDER — HALOPERIDOL LACTATE 2 MG/ML PO CONC: 0.5000 mg | ORAL | 0 refills | Status: DC | PRN

## 2017-04-04 MED ORDER — SODIUM CHLORIDE 0.9 % IV SOLN: 250.0000 mL | INTRAVENOUS | Status: DC | PRN

## 2017-04-04 MED ORDER — ACETAMINOPHEN 650 MG RE SUPP: 650.0000 mg | Freq: Four times a day (QID) | RECTAL | 0 refills | Status: DC | PRN

## 2017-04-04 MED ORDER — ACETAMINOPHEN 650 MG RE SUPP: 650.0000 mg | Freq: Four times a day (QID) | RECTAL | Status: DC | PRN

## 2017-04-04 MED ORDER — ASPIRIN 325 MG PO TABS
325.0000 mg | ORAL_TABLET | Freq: Every day | ORAL | Status: DC
  Filled 2017-04-04 (×2): qty 1

## 2017-04-04 MED ORDER — POLYVINYL ALCOHOL 1.4 % OP SOLN
1.0000 [drp] | Freq: Four times a day (QID) | OPHTHALMIC | Status: DC | PRN
  Filled 2017-04-04: qty 15

## 2017-04-04 NOTE — Progress Notes (Addendum)
Physical Therapy Treatment Patient Details Name: Yolanda Turner MRN: [id number] DOB: 08-Oct-1925 Today's Date: 04/04/2017    History of Present Illness 83yo female admitted 10/21 to ED with speech problems. PMHx significant for CAD, DM, HTN, CVA, PVD, and uterine cancer. Chart review also indicates hx with trouble swallowing (since last CVA) and often whispers. CT and carotid ultrasound negative for acute injury. Echo and neuro consult pending.    PT Comments    Pt in bed curled up on her left side but agrees to session.  Family in attendance.  Max a x 1 to reposition in bed in supine for exercises as described below.  Rolling to left with mod a x 1.  Pt did put forth good effort to roll and assisted with rail and LUE but was unable to initiate upright position.  Max a x 1 to obtain upright posture.  She was able to remain sitting x 2 minutes with moderate support of trunk.  When asked if she could sit upright without help she shook her head "no".  Pt appropriate in yes/no head shakes for questions asked during session.  No verbalizations during session but she was trying to convey something at end of session but I was unable to decode it (no pain, bathroom etc).  Pt may have been asking to eat.  Explained to pt NPO and relayed to CNA as family thought she might be able to have apple sauce.  Discussed with Belenda Cruise from speech therapy who plans to see pt today.  Pt positioned to comfort at end of session.     Follow Up Recommendations  CIR     Equipment Recommendations       Recommendations for Other Services       Precautions / Restrictions Precautions Precautions: Fall Precaution Comments: NPO Restrictions Weight Bearing Restrictions: No    Mobility  Bed Mobility Overal bed mobility: Needs Assistance Bed Mobility: Supine to Sit;Sit to Supine;Rolling Rolling: Mod assist;Max assist   Supine to sit: Max assist;Total assist Sit to supine: Max assist;Total assist   General bed  mobility comments: assisted with rolling to right this session but unable to initiate upright position  Transfers                 General transfer comment: unsafe/unable  Ambulation/Gait             General Gait Details: unsafe/unable   Stairs            Wheelchair Mobility    Modified Rankin (Stroke Patients Only)       Balance Overall balance assessment: Needs assistance Sitting-balance support: No upper extremity supported;Feet supported Sitting balance-Leahy Scale: Zero Sitting balance - Comments: +1 assist to maintian sitting balance at all times.  Pt aware of sitting balance deficits. Postural control: Right lateral lean     Standing balance comment: unsafe/unable                            Cognition Arousal/Alertness: Awake/alert Behavior During Therapy: Flat affect Overall Cognitive Status: Difficult to assess                         Following Commands: Follows one step commands consistently       General Comments: seemed appropriate answering questions today with head nodding yes/no a times.  Trying to communicate something at end of session but unable to figure it out.  Exercises Other Exercises Other Exercises: BLE ankle pumps, heel slides and SLR x 10 AAROM.  L>R strength but difficult to accurately test.    General Comments        Pertinent Vitals/Pain Pain Assessment: Faces Faces Pain Scale: No hurt Pain Location: Seems comfortable and without pain during session.    Home Living                      Prior Function            PT Goals (current goals can now be found in the care plan section) Progress towards PT goals: Progressing toward goals    Frequency    7X/week      PT Plan Current plan remains appropriate    Co-evaluation              AM-PAC PT "6 Clicks" Daily Activity  Outcome Measure  Difficulty turning over in bed (including adjusting bedclothes, sheets  and blankets)?: Unable Difficulty moving from lying on back to sitting on the side of the bed? : Unable Difficulty sitting down on and standing up from a chair with arms (e.g., wheelchair, bedside commode, etc,.)?: Unable Help needed moving to and from a bed to chair (including a wheelchair)?: Total Help needed walking in hospital room?: Total Help needed climbing 3-5 steps with a railing? : Total 6 Click Score: 6    End of Session   Activity Tolerance: Patient tolerated treatment well;Patient limited by lethargy Patient left: in bed;with bed alarm set;with call bell/phone within reach;with family/visitor present   Hemiplegia - Right/Left: Right Hemiplegia - dominant/non-dominant: Dominant Hemiplegia - caused by: Cerebral infarction     Time: 0174-9449 PT Time Calculation (min) (ACUTE ONLY): 23 min  Charges:  $Therapeutic Exercise: 8-22 mins $Therapeutic Activity: 8-22 mins                    G Codes:

## 2017-04-04 NOTE — Progress Notes (Signed)
[facility] NAME: Carolanne Mercier    MR#:  [id number]  DATE OF BIRTH:  January 27, 1926  SUBJECTIVE:  CHIEF COMPLAINT:   Chief Complaint  Patient presents with  . Choking   The patient is still drowsy and lethargic, unable to answer questions or follow Commands. REVIEW OF SYSTEMS:  Review of Systems  Unable to perform ROS: Mental status change    DRUG ALLERGIES:   Allergies  Allergen Reactions  . Penicillins Rash    Has patient had a PCN reaction causing immediate rash, facial/tongue/throat swelling, SOB or lightheadedness with hypotension: No Has patient had a PCN reaction causing severe rash involving mucus membranes or skin necrosis: No Has patient had a PCN reaction that required hospitalization: No Has patient had a PCN reaction occurring within the last 10 years: No If all of the above answers are "NO", then may proceed with Cephalosporin use.   VITALS:  Blood pressure (!) 157/56, pulse 92, temperature 98.1 F (36.7 C), temperature source Oral, resp. rate 20, height 5' (1.524 m), weight 81 lb 8 oz (37 kg), SpO2 95 %. PHYSICAL EXAMINATION:  Physical Exam  Constitutional:  Severe malnutrition.  HENT:  Head: Normocephalic.  Mouth/Throat: Oropharynx is clear and moist.  Eyes: Pupils are equal, round, and reactive to light. Conjunctivae and EOM are normal. No scleral icterus.  Neck: Normal range of motion. Neck supple. No JVD present. No tracheal deviation present.  Cardiovascular: Normal rate, regular rhythm and normal heart sounds.  Exam reveals no gallop.   No murmur heard. Pulmonary/Chest: Effort normal and breath sounds normal. No respiratory distress. She has no wheezes. She has no rales.  Abdominal: Soft. Bowel sounds are normal. She exhibits no distension. There is no tenderness. There is no rebound.  Musculoskeletal: Normal range of motion. She exhibits no edema or tenderness.  Neurological: No cranial nerve  deficit.  Unable to exam.  Skin: No rash noted. No erythema.  Psychiatric:  The patient is drowsy and lethargic   LABORATORY PANEL:  Female CBC  Recent Labs Lab 04/03/17 0423  WBC 3.6  HGB 11.4*  HCT 34.2*  PLT 176   ------------------------------------------------------------------------------------------------------------------ Chemistries   Recent Labs Lab 04/02/17 1424 04/03/17 0423  NA 134* 140  K 4.1 4.0  CL 106 107  CO2 27 26  GLUCOSE 160* 106*  BUN 19 14  CREATININE 0.87 0.75  CALCIUM 9.3 9.1  AST 23  --   ALT 13*  --   ALKPHOS 70  --   BILITOT 0.7  --    RADIOLOGY:  Mr Jeri Cos Wo Contrast  Result Date: 04/03/2017 CLINICAL DATA:  Initial evaluation for acute speech difficulty. EXAM: MRI HEAD WITHOUT AND WITH CONTRAST TECHNIQUE: Multiplanar, multiecho pulse sequences of the brain and surrounding structures were obtained without and with intravenous contrast. CONTRAST:  75mL MULTIHANCE GADOBENATE DIMEGLUMINE 529 MG/ML IV SOLN COMPARISON:  Prior MRI from 08/13/2014. FINDINGS: Brain: Generalized age related cerebral atrophy, progressed relative to 2 out 16. Patchy and confluent T2/FLAIR hyperintensity within the periventricular and deep white matter both cerebral hemispheres, most consistent with chronic small vessel ischemic disease, also progressed relative to previous. Chronic microvascular ischemic changes present within the pons. Superimposed remote right frontal, left parietal, and left occipital lobe cortical infarcts. Small remote lacunar infarcts present at the bilateral lentiform nuclei. Tiny remote bilateral cerebellar infarcts noted. Mild scattered chronic blood products noted about a few of these remote infarcts. Patchy moderate-size diffusion abnormality seen  involving the cortical gray matter and underlying deep and periventricular white matter of the posterior left MCA distribution, consistent with acute ischemic infarcts. There is patchy involvement of the  posterior insula, left frontal operculum, left periatrial white matter, and posterior left frontoparietal region. No associated mass effect or hemorrhage. No mass lesion, midline shift or mass effect. Mild diffuse ventricular prominence related global parenchymal volume loss of hydrocephalus. No extra-axial fluid collection. Major dural sinuses are patent. No abnormal enhancement. Pituitary gland suprasellar region normal. Midline structures intact and within normal limits. Vascular: Major intracranial vascular flow voids are maintained Skull and upper cervical spine: Craniocervical junction within normal limits. Visualized upper cervical spine unremarkable. Bone marrow signal intensity within normal limits. No scalp soft tissue abnormality. Sinuses/Orbits: Globes and orbital soft tissues within normal limits. Patient status post lens extraction on the right. Scattered mucosal thickening throughout the paranasal sinuses, worse on the left. No air-fluid level to suggest acute sinusitis. No appreciable mastoid effusion. Inner ear structures grossly normal. Other: None. IMPRESSION: 1. Patchy moderate sized acute left MCA territory infarcts involving the posterior left frontal parietal region. No associated hemorrhage or mass effect. 2. Multiple chronic infarcts involving the bilateral cerebral and cerebellar hemispheres as above. 3. Generalized cerebral atrophy with moderate chronic small vessel ischemic disease, progressed relative to 2016. Electronically Signed   By: Jeannine Boga M.D.   On: 04/03/2017 21:18   ASSESSMENT AND PLAN:   * Acute CVA with dysphagia    suspect  TIA Vs stroke MRI brain showed acute CVA, MRA is remarkable. Echo: Left ventricle: The cavity size was normal. There was moderate   concentric hypertrophy. Systolic function was normal. The   estimated ejection fraction was in the range of 55% to 65%.   She was put on Nothing by mouth with IV fluid support per speech  study. Reevaluation suggests dysphagia 1 diet.  She was given Aspirin - dose 300mg  rectally. Change to aspirin 81 mg daily plus Plavix 75 mg by mouth daily per Dr. Doy Mince. PT evaluation suggested CIR.  Acute altered mental status. Possible due to acute CVA and medication. Aspiration and fall precaution.  * Hypertension Hold hypertension by mouth medication, allow permissive blood pressure.  * HLP   Hold meds,   * CAD    Hold oral meds as dysphagia  Severe malnutrition. Dietitian consult.     Discussed with Dr. Doy Mince. All the records are reviewed and case discussed with Care Management/Social Worker. Management plans discussed with the patient,  her daughter and granddaughter and they are in agreement.  CODE STATUS: Full Code  TOTAL TIME TAKING CARE OF THIS PATIENT: 33  minutes.   More than 50% of the time was spent in counseling/coordination of care: YES  POSSIBLE D/C IN 2 DAYS, DEPENDING ON CLINICAL CONDITION.   Demetrios Loll M.D on 04/04/2017 at 1:26 PM  Between 7am to 6pm - Pager - [phone]  After 6pm go to [email or web address] - [facility]

## 2017-04-04 NOTE — Progress Notes (Signed)
Patient refused aspirin and Plavix family members in room and aware

## 2017-04-04 NOTE — Progress Notes (Signed)
Speech Language Pathology Treatment: Dysphagia  Patient Details Name: Yolanda Turner MRN: [id number] DOB: 1926/01/05 Today's Date: 04/04/2017 Time: 1001-1101 SLP Time Calculation (min) (ACUTE ONLY): 60 min  Assessment / Plan / Recommendation Clinical Impression  Pt was seen for ongoing assessment of swallow function and trials to upgrade diet. Today, pt appeared much more engaged; awake and alert in the bed, though still nonverbal w/ expressive language deficits. Pt does appear to communicate appropriately w/ head nodding to indicate wants/needs. Pt is not at her language baseline per family.  Pt was given ice chips, puree, and Nectar consistency liquid via TSP. Pt exhibited Right-sided labial and lingual weakness impacting labial seal and lingual clearing (min-mod).  During trials of ice chips, pt exhibited adequate bolus management and timely swallows. No decline respiratory status were noted during trials given. No overt, immediate s/s of aspiration (coughing) were noted.  During trials of Nectar consistency liquid via TSP, pt exhibited min reduced Right-sided labial seal and min anterior spillage. No decline respiratory status were noted during trials given. No overt, immediate s/s of aspiration (coughing) were noted.  During trials of puree, pt exhibited min reduced Right-sided labial seal and min reduced Right-sided lingual clearing. ST educated on lingual sweeping and a second dry swallow for oral/pharyngeal clearing. Pt requires total assist during meals. ST educated family on aspiration precautions - sitting upright, small sips/bites, reducing distractions during meals, alternating solids and liquids, checking for oral clearing, use of follow up dry swallows; ST educated family on Nectar thick consistency.  D/t pt's overall presentation and increased risk of aspiration, Recommend a trial Dysphagia level 1 diet w/ Nectar consistency liquids; strict aspiration precautions, reflux precautions.  Recommend rest breaks during meals, meds crushed in applesauce, and total assist during meals.  ST services will f/u w/ pt's status, diet toleration, trials to upgrade as appropriate, and education as pt is admitted. NSG/MD updated.      HPI HPI:  Pt is a 83 y.o. female with a known history of CAD, DM, Htn, Stroke, GERD- lives with daughter- walks with a cane, some memory issues, but able to walk and take care of herself. Yesterday morning was fine, but later noted to have speech problem and noted chocking on liquids. She also complained to her daughter about pain in left leg and did not walk since the episode. Pt is frustrated as per family as she can not speak.      SLP Plan  Goals updated       Recommendations  Diet recommendations: Dysphagia 1 (puree);Nectar-thick liquid (liquids via TSP) Liquids provided via: Teaspoon Medication Administration: Crushed with puree Supervision: Staff to assist with self feeding;Full supervision/cueing for compensatory strategies Compensations: Minimize environmental distractions;Slow rate;Small sips/bites;Lingual sweep for clearance of pocketing;Multiple dry swallows after each bite/sip;Follow solids with liquid Postural Changes and/or Swallow Maneuvers: Seated upright 90 degrees;Upright 30-60 min after meal                General recommendations:  (dietician f/u) Oral Care Recommendations: Oral care QID;Staff/trained caregiver to provide oral care Follow up Recommendations:  (TBD) SLP Visit Diagnosis: Dysphagia, oropharyngeal phase (R13.12) Plan: Goals updated       [place], SLP-Graduate Student Carolynn Sayers 04/04/2017, 12:25 PM   This information has been reviewed and agreed upon by this supervising clinician.  This patient note, response to treatment and overall treatment plan has been reviewed and this clinician  agrees with the information provided.   04/04/17, 3:03 PM [place],  [place], CCC-SLP

## 2017-04-04 NOTE — Care Management (Signed)
Spoke with Palliative Care representative [place] that the family would like to take their family member home. Discussed Hospice agencies with daughter. Chose [facility] of [facility]. Flo Shanks, RN representative updated. Daughter indicated they would need a hospital bed. Shelbie Ammons RN MSN CCM Care management [phone]

## 2017-04-04 NOTE — Consult Note (Signed)
Consultation Note Date: 04/04/2017   Patient Name: Yolanda Turner  DOB: 1926-04-23  MRN: [id number]  Age / Sex: 83 y.o., female  PCP: Albina Billet, MD Referring Physician: Demetrios Loll, MD  Reason for Consultation: Establishing goals of care  HPI/Patient Profile: Yolanda Turner  is a 83 y.o. female with a known history of CAD, DM, Htn, Stroke- lives with granddaughter- walks with a cane, some memory issues, but able to walk and take care of herself.  Clinical Assessment and Goals of Care: Patient is resting in bed. Spoke with family who states Yolanda Turner has expressed that she is tired. She has discussed a dream of heaven and seeing her mother there. She has 6 living children, one who has CP. She has been the caretaker for the family and was very independent, she lives with a granddaughter.   She is not verbal, but nods her head "yes" when asked if she is having pain. Asked if it was head pain, she shook head "no". Asked if it was pain in her legs and she nodded "yes", as her granddaughter indicated she has neuropathy . Asked if she was 82 years old and she shook her head "no". I asked if she was at home, she shook her head "no", asked if she was at the mall and she shook her head "no". Asked if she was at hospital and she nodded "yes".    Daughter and granddaughter at bedside. Patient was asked if she would want a feeding tube, she shakes her head "no". Asked if she would want chest compressions, shocks, or breathing tube, she shook her head "no". She became tearful. She was asked if she wanted to come back to the hospital if needed and she shook her head "no".  I asked if she wanted to go to rehab for strengthening and she shook her head "no". Concepts of hospice care, and comfort care discussed and she nodded "yes" in agreement. RN came in to give her medication while I was in room with family. She shook her head  "no" that she did not want it. Her granddaughter told her it was her Plavix and asked her if she wanted to take the pill, and patient squinted appearing to ponder for a second and shook her head "no".  I asked her if she wanted me to stop all of her medications besides ones for pain and comfort and she shook her head "yes". I asked if she only wanted things that made her comfortable, she nodded "yes".  Questions answered, family wanting to take her home with hospice and comfort care measures. Additional family coming to hospital.   Daughters Phillis and Hassan Rowan on phone. Daughter Gery Pray, Inez Catalina present. Granddaughter present for family meeting and Hospice meeting. They are amenable to plan to for home with hospice comfort care.   PATIENT is Yolanda Turner. MOST form signed by patient with non-dominant left hand due to CVA.     [facility] with Hospice. She does  not want to continue home meds unless for comfort   Code Status/Advance Care Planning:  DNR    Symptom Management:   Rubinol for secretions.   Haldol for agitation.  Ativan for anxiety.  Morphine for pain.   Liquid Neurontin for neuropathic pain  Palliative Prophylaxis:   Frequent Pain Assessment  Additional Recommendations (Limitations, Scope, Preferences):  Avoid Hospitalization, Full Comfort Care and Initiate Comfort Feeding   Prognosis:   < 2 weeks Patient no longer wants maintenance medication including ASA and Plavix. She is thin and frail and is drinking very small amounts and not eating.   Discharge Planning: Home with Home Health      Primary Diagnoses: Present on Admission: . TIA (transient ischemic attack) . Acute CVA (cerebrovascular accident) ([place])   I have reviewed the medical record, interviewed the patient and family, and examined the patient. The following aspects are pertinent.  Past Medical History:  Diagnosis Date  . Carotid artery stenosis   . Carotid  artery stenosis   . Collagen vascular disease ([place])   . Coronary artery disease   . Diabetes mellitus without complication (HCC)    diet control  . GERD (gastroesophageal reflux disease)   . Hypertension   . Myocardial infarction ([place])   . Osteoporosis   . Peripheral vascular disease ([place])   . Pneumonia   . Renal artery stenosis ([place])   . Stroke ([place])   . Uterine cancer [facility])    Social History   Social History  . Marital status: Widowed    Spouse name: N/A  . Number of children: N/A  . Years of education: N/A   Social History Main Topics  . Smoking status: Former Research scientist (life sciences)  . Smokeless tobacco: Never Used  . Alcohol use No  . Drug use: No  . Sexual activity: Not Asked   Other Topics Concern  . None   Social History Narrative  . None   Family History  Problem Relation Age of Onset  . Lung cancer Daughter    Scheduled Meds: . [START ON 04/05/2017] multivitamin with minerals  1 tablet Oral Daily  . pneumococcal 23 valent vaccine  0.5 mL Intramuscular Tomorrow-1000  . sodium chloride flush  3 mL Intravenous Q12H   Continuous Infusions: . sodium chloride     PRN Meds:.sodium chloride, acetaminophen **OR** acetaminophen, antiseptic oral rinse, docusate sodium, glycopyrrolate **OR** glycopyrrolate **OR** glycopyrrolate, haloperidol **OR** haloperidol **OR** haloperidol lactate, hydrALAZINE, LORazepam **OR** LORazepam **OR** LORazepam, morphine injection, ondansetron **OR** ondansetron (ZOFRAN) IV, polyvinyl alcohol, promethazine, sodium chloride flush Medications Prior to Admission:  Prior to Admission medications   Medication Sig Start Date End Date Taking? Authorizing Provider  clopidogrel (PLAVIX) 75 MG tablet Take 75 mg by mouth at bedtime.    Yes [provider]  gabapentin (NEURONTIN) 300 MG capsule Take 300 mg by mouth at bedtime.    Yes [provider]  lisinopril (PRINIVIL,ZESTRIL) 10 MG tablet Take 10 mg by mouth at bedtime.   Yes [provider]  metoprolol succinate (TOPROL-XL) 25 MG 24 hr tablet Take 25 mg by mouth daily. Take with or immediately following a meal.    Yes [provider]  simvastatin (ZOCOR) 40 MG tablet Take 40 mg by mouth daily at 6 PM.   Yes [provider]  vitamin B-12 (CYANOCOBALAMIN) 500 MCG tablet Take 500 mcg by mouth daily.   Yes [provider]   Allergies  Allergen Reactions  . Penicillins Rash    Has patient had  a PCN reaction causing immediate rash, facial/tongue/throat swelling, SOB or lightheadedness with hypotension: No Has patient had a PCN reaction causing severe rash involving mucus membranes or skin necrosis: No Has patient had a PCN reaction that required hospitalization: No Has patient had a PCN reaction occurring within the last 10 years: No If all of the above answers are "NO", then may proceed with Cephalosporin use.   Review of Systems  Constitutional: Positive for activity change.  Neurological:       Nods head yes, leg neuropathic pain.     Physical Exam  Constitutional:  Thin and frail  HENT:  Head: Atraumatic.  Eyes: EOM are normal.  Pulmonary/Chest: Effort normal.  Abdominal: Soft.  Neurological: She is alert.    Vital Signs: BP (!) 157/56 (BP Location: Right Arm)   Pulse 92   Temp 98.1 F (36.7 C) (Oral)   Resp 20   Ht 5' (1.524 m)   Wt 37 kg (81 lb 8 oz)   SpO2 95%   BMI 15.92 kg/m  Pain Assessment: No/denies pain   Pain Score: 0-No pain   SpO2: SpO2: 95 % O2 Device:SpO2: 95 % O2 Flow Rate: .   IO: Intake/output summary:  Intake/Output Summary (Last 24 hours) at 04/04/17 1449 Last data filed at 04/03/17 2232  Gross per 24 hour  Intake           521.67 ml  Output              300 ml  Net           221.67 ml    LBM:   Baseline Weight: Weight: 35 kg (77 lb 2.6 oz) Most recent weight: Weight: 37 kg (81 lb 8 oz)     Palliative Assessment/Data: 20%     Time In: 2:00 Time Out: 3:45 Time Total: 1 hour 45  minutes Greater than 50%  of this time was spent counseling and coordinating care related to the above assessment and plan.  Signed by: Asencion Gowda, NP 04/04/2017 3:33 PM Office: [phone] 7am-7pm  Pager: [phone] Call primary team after hours  Please contact Palliative Medicine Team phone at [phone] for questions and concerns.  For individual provider: See Shea Evans

## 2017-04-04 NOTE — Progress Notes (Signed)
New referral for [facility] services at home received from [facility]. Patient is an 83 year old woman with a known history of CAD, DM, HTN, CVA. admitted on 10/21 for evaluation of a speech problem and new onset swallowing difficulties.  MRI revealed new acute left MCA territory infarcts involving the posterior left frontal parietal region. She is aphasic and has right sided weakness. Family met with Palliative Medicine NP Asencion Gowda today and have decided to focus on comfort with the support of hospice services. Writer met in the family room with patient's daughter's Adonis Huguenin, granddaughter Donia Guiles and grandson Gerald Stabs, daughter's Silva Bandy and Hassan Rowan conferenced on the telephone, to initiate education regarding hospice services, philosophy and team approach to care with good understanding voiced. Questions answered and emotional support given. Hospice contact number and information given to [facility]. Family requested a hospital bed and would like delivery today. DME ordered for delivery as soon as possible. Patient will require EMS transport. Signed DNR in place in patient's chart. Discharge to be after bed is delivered, family to update staff RN when bed is in place. Hospital care team updated. Patient information faxed to referral. Flo Shanks RN, BSN, [facility], hospital liaison [phone] c

## 2017-04-04 NOTE — Progress Notes (Signed)
Advanced Care Plan.  Purpose of Encounter: CODE STATUS and possible palliative care. Parties in Attendance: The patient, her daughter, grandson and granddaughter, and me. Patient's Decisional Capacity: No. Medical Story:  Yolanda Turner  is a 83 y.o. female with a known history of CAD, DM, HTN and stoke. She was admitted for dysphagia and found acute CVA. She is drowsy and lethargic, on IV fluid support. I discussed about the patient's condition, poor prognosis, CODE STATUS and palliative care with the patient's daughter and granddaughter. They agreed to palliative care consult. Goals of Care Determinations: Palliative care. Plan:  Code Status: Full code. Time spent discussing advance care planning: 20 minutes.

## 2017-04-04 NOTE — Progress Notes (Signed)
OT Cancellation Note  Patient Details Name: Yolanda Turner MRN: [id number] DOB: 1926/06/01   Cancelled Treatment:    Reason Eval/Treat Not Completed: Other (comment). Chart reivewed. Palliative care consult pending. Will hold OT treatment this date and re-attempt next date as appropriate pending plan of care following palliative care meeting.  Jeni Salles, MPH, MS, OTR/L ascom [phone] 04/04/17, 2:00 PM

## 2017-04-04 NOTE — Discharge Instructions (Signed)
Hospice care at home. °

## 2017-04-04 NOTE — Discharge Summary (Signed)
[place] [facility] [place] NAME: Yolanda Turner    MR#:  [id number]  [place] OF BIRTH:  April 28, 1926  DATE OF ADMISSION:  04/02/2017   ADMITTING PHYSICIAN: Vaughan Basta, MD  DATE OF DISCHARGE: 04/04/2017 PRIMARY CARE PHYSICIAN: Albina Billet, MD   ADMISSION DIAGNOSIS:  Cerebral infarction ([place]) [I63.9] CVA (cerebral infarction) [I63.9] Dysarthria [R47.1] DISCHARGE DIAGNOSIS:  Principal Problem:   Speech problem Active Problems:   Confusion   TIA (transient ischemic attack)   Acute CVA (cerebrovascular accident) ([place])  SECONDARY DIAGNOSIS:   Past Medical History:  Diagnosis Date  . Carotid artery stenosis   . Carotid artery stenosis   . Collagen vascular disease ([place])   . Coronary artery disease   . Diabetes mellitus without complication (HCC)    diet control  . GERD (gastroesophageal reflux disease)   . Hypertension   . Myocardial infarction ([place])   . Osteoporosis   . Peripheral vascular disease ([place])   . Pneumonia   . Renal artery stenosis ([place])   . Stroke ([place])   . Uterine cancer [facility])    HOSPITAL COURSE:  * Acute CVA with dysphagia  suspect TIA Vs stroke MRI brain showed acute CVA, MRA is remarkable. Echo: Left ventricle: The cavity size was normal. There was moderate concentric hypertrophy. Systolic function was normal. The estimated ejection fraction was in the range of 55% to 65%.   She was put on Nothing by mouth with IV fluid support per speech study. Reevaluation suggests dysphagia 1 diet.  She was given Aspirin - dose 300mg  rectally. Change to aspirin 81 mg daily plus Plavix 75 mg by mouth daily per Dr. Doy Mince. PT evaluation suggested CIR.  Acute altered mental status. Possible due to acute CVA and medication. Aspiration and fall precaution.  * Hypertension Hold hypertension by mouth medication, allow permissive blood pressure.  * HLP Hold meds,   * CAD Hold oral meds as  dysphagia  Severe malnutrition.   After discussion with palliative care nurse practitioner Ms. Laurann Montana, the patient's family member decided comfort care for now and discharge patient to home with hospice care. DISCHARGE CONDITIONS:  Very poor prognosis, the patient will be discharged to home with hospice care today. CONSULTS OBTAINED:  Treatment Team:  Alexis Goodell, MD DRUG ALLERGIES:   Allergies  Allergen Reactions  . Penicillins Rash    Has patient had a PCN reaction causing immediate rash, facial/tongue/throat swelling, SOB or lightheadedness with hypotension: No Has patient had a PCN reaction causing severe rash involving mucus membranes or skin necrosis: No Has patient had a PCN reaction that required hospitalization: No Has patient had a PCN reaction occurring within the last 10 years: No If all of the above answers are "NO", then may proceed with Cephalosporin use.   DISCHARGE MEDICATIONS:   Allergies as of 04/04/2017      Reactions   Penicillins Rash   Has patient had a PCN reaction causing immediate rash, facial/tongue/throat swelling, SOB or lightheadedness with hypotension: No Has patient had a PCN reaction causing severe rash involving mucus membranes or skin necrosis: No Has patient had a PCN reaction that required hospitalization: No Has patient had a PCN reaction occurring within the last 10 years: No If all of the above answers are "NO", then may proceed with Cephalosporin use.      Medication List    STOP taking these medications   clopidogrel 75 MG tablet Commonly known as:  PLAVIX   gabapentin 300  MG capsule Commonly known as:  NEURONTIN Replaced by:  gabapentin 250 MG/5ML solution   lisinopril 10 MG tablet Commonly known as:  PRINIVIL,ZESTRIL   metoprolol succinate 25 MG 24 hr tablet Commonly known as:  TOPROL-XL   simvastatin 40 MG tablet Commonly known as:  ZOCOR   vitamin B-12 500 MCG tablet Commonly known as:  CYANOCOBALAMIN       TAKE these medications   gabapentin 250 MG/5ML solution Commonly known as:  NEURONTIN Take 2 mLs (100 mg total) by mouth every 8 (eight) hours. Replaces:  gabapentin 300 MG capsule        DISCHARGE INSTRUCTIONS:  See AVS.  If you experience worsening of your admission symptoms, develop shortness of breath, life threatening emergency, suicidal or homicidal thoughts you must seek medical attention immediately by calling 911 or calling your MD immediately  if symptoms less severe.  You Must read complete instructions/literature along with all the possible adverse reactions/side effects for all the Medicines you take and that have been prescribed to you. Take any new Medicines after you have completely understood and accpet all the possible adverse reactions/side effects.   Please note  You were cared for by a hospitalist during your hospital stay. If you have any questions about your discharge medications or the care you received while you were in the hospital after you are discharged, you can call the unit and asked to speak with the hospitalist on call if the hospitalist that took care of you is not available. Once you are discharged, your primary care physician will handle any further medical issues. Please note that NO REFILLS for any discharge medications will be authorized once you are discharged, as it is imperative that you return to your primary care physician (or establish a relationship with a primary care physician if you do not have one) for your aftercare needs so that they can reassess your need for medications and monitor your lab values.  DATA REVIEW:   CBC  Recent Labs Lab 04/03/17 0423  WBC 3.6  HGB 11.4*  HCT 34.2*  PLT 176    Chemistries   Recent Labs Lab 04/02/17 1424 04/03/17 0423  NA 134* 140  K 4.1 4.0  CL 106 107  CO2 27 26  GLUCOSE 160* 106*  BUN 19 14  CREATININE 0.87 0.75  CALCIUM 9.3 9.1  AST 23  --   ALT 13*  --   ALKPHOS 70  --    BILITOT 0.7  --      Microbiology Results  No results found for this or any previous visit.  RADIOLOGY:  Mr Jeri Cos Wo Contrast  Result Date: 04/03/2017 CLINICAL DATA:  Initial evaluation for acute speech difficulty. EXAM: MRI HEAD WITHOUT AND WITH CONTRAST TECHNIQUE: Multiplanar, multiecho pulse sequences of the brain and surrounding structures were obtained without and with intravenous contrast. CONTRAST:  47mL MULTIHANCE GADOBENATE DIMEGLUMINE 529 MG/ML IV SOLN COMPARISON:  Prior MRI from 08/13/2014. FINDINGS: Brain: Generalized age related cerebral atrophy, progressed relative to 2 out 16. Patchy and confluent T2/FLAIR hyperintensity within the periventricular and deep white matter both cerebral hemispheres, most consistent with chronic small vessel ischemic disease, also progressed relative to previous. Chronic microvascular ischemic changes present within the pons. Superimposed remote right frontal, left parietal, and left occipital lobe cortical infarcts. Small remote lacunar infarcts present at the bilateral lentiform nuclei. Tiny remote bilateral cerebellar infarcts noted. Mild scattered chronic blood products noted about a few of these remote infarcts. Patchy moderate-size diffusion  abnormality seen involving the cortical gray matter and underlying deep and periventricular white matter of the posterior left MCA distribution, consistent with acute ischemic infarcts. There is patchy involvement of the posterior insula, left frontal operculum, left periatrial white matter, and posterior left frontoparietal region. No associated mass effect or hemorrhage. No mass lesion, midline shift or mass effect. Mild diffuse ventricular prominence related global parenchymal volume loss of hydrocephalus. No extra-axial fluid collection. Major dural sinuses are patent. No abnormal enhancement. Pituitary gland suprasellar region normal. Midline structures intact and within normal limits. Vascular: Major  intracranial vascular flow voids are maintained Skull and upper cervical spine: Craniocervical junction within normal limits. Visualized upper cervical spine unremarkable. Bone marrow signal intensity within normal limits. No scalp soft tissue abnormality. Sinuses/Orbits: Globes and orbital soft tissues within normal limits. Patient status post lens extraction on the right. Scattered mucosal thickening throughout the paranasal sinuses, worse on the left. No air-fluid level to suggest acute sinusitis. No appreciable mastoid effusion. Inner ear structures grossly normal. Other: None. IMPRESSION: 1. Patchy moderate sized acute left MCA territory infarcts involving the posterior left frontal parietal region. No associated hemorrhage or mass effect. 2. Multiple chronic infarcts involving the bilateral cerebral and cerebellar hemispheres as above. 3. Generalized cerebral atrophy with moderate chronic small vessel ischemic disease, progressed relative to 2016. Electronically Signed   By: Jeannine Boga M.D.   On: 04/03/2017 21:18     Management plans discussed with the patient, family and they are in agreement.  CODE STATUS: DNR   TOTAL TIME TAKING CARE OF THIS PATIENT: 33 minutes.    Demetrios Loll M.D on 04/04/2017 at 4:43 PM  Between 7am to 6pm - Pager - [phone]  After 6pm go to [email or web address] - Proofreader  [facility]  Office  [phone]  CC: Primary care physician; Albina Billet, MD   Note: This dictation was prepared with Dragon dictation along with smaller phrase technology. Any transcriptional errors that result from this process are unintentional.

## 2017-04-04 NOTE — Clinical Social Work Note (Signed)
CSW received referral for SNF.  Case discussed with case manager and plan is to discharge home with hospice.  CSW to sign off please re-consult if social work needs arise.  Daquann Merriott R. Fannie Alomar, MSW, LCSWA [phone]  

## 2017-04-04 NOTE — Progress Notes (Signed)
Subjective: Patient more alert today.  Up in bed.  Family feeding patient.    Objective: Current vital signs: BP (!) 157/56 (BP Location: Right Arm)   Pulse 92   Temp 98.1 F (36.7 C) (Oral)   Resp 20   Ht 5' (1.524 m)   Wt 37 kg (81 lb 8 oz)   SpO2 95%   BMI 15.92 kg/m  Vital signs in last 24 hours: Temp:  [97.6 F (36.4 C)-98.2 F (36.8 C)] 98.1 F (36.7 C) (10/23 0813) Pulse Rate:  [84-104] 92 (10/23 0813) Resp:  [16-20] 20 (10/23 0350) BP: (152-168)/(56-76) 157/56 (10/23 0813) SpO2:  [95 %-98 %] 95 % (10/23 0813)  Intake/Output from previous day: 10/22 0701 - 10/23 0700 In: 521.7 [I.V.:521.7] Out: 300 [Urine:300] Intake/Output this shift: No intake/output data recorded. Nutritional status: DIET - DYS 1 Room service appropriate? Yes with Assist; Fluid consistency: Nectar Thick  Neurologic Exam: Mental Status: Alert, no speech.   Cranial Nerves: II: Pupils irregular III,IV, VI: left ptosis, extra-ocular motions intact bilaterally V,VII: smile symmetric, facial light touch sensation normal bilaterally VIII: hearing normal bilaterally IX,X: gag reflex reduced XI: bilateral shoulder shrug XII: unable to test Motor: Lifts and maintains LUE off the bed.  RUE drifts down to the bed.  Able to lift both lower extremities off the bed   Lab Results: Basic Metabolic Panel:  Recent Labs Lab 04/02/17 1424 04/03/17 0423  NA 134* 140  K 4.1 4.0  CL 106 107  CO2 27 26  GLUCOSE 160* 106*  BUN 19 14  CREATININE 0.87 0.75  CALCIUM 9.3 9.1    Liver Function Tests:  Recent Labs Lab 04/02/17 1424  AST 23  ALT 13*  ALKPHOS 70  BILITOT 0.7  PROT 6.6  ALBUMIN 4.0   No results for input(s): LIPASE, AMYLASE in the last 168 hours. No results for input(s): AMMONIA in the last 168 hours.  CBC:  Recent Labs Lab 04/02/17 1424 04/03/17 0423  WBC 3.8 3.6  NEUTROABS 3.0  --   HGB 11.7* 11.4*  HCT 36.1 34.2*  MCV 87.6 86.8  PLT 173 176    Cardiac  Enzymes:  Recent Labs Lab 04/02/17 1424  TROPONINI <0.03    Lipid Panel:  Recent Labs Lab 04/02/17 1424  CHOL 123  TRIG 108  HDL 60  CHOLHDL 2.1  VLDL 22  LDLCALC 41    CBG:  Recent Labs Lab 04/03/17 1958  GLUCAP 102*    Microbiology: No results found for this or any previous visit.  Coagulation Studies: No results for input(s): LABPROT, INR in the last 72 hours.  Imaging: Dg Chest 2 View  Result Date: 04/02/2017 CLINICAL DATA:  Choking.  Nausea. EXAM: CHEST  2 VIEW COMPARISON:  August 12, 2014 FINDINGS: Previous CABG with intact sternotomy wires. Mild cardiomegaly. The hila and mediastinum are normal. No pulmonary nodules, masses, or focal infiltrates. IMPRESSION: No active cardiopulmonary disease. Electronically Signed   By: Dorise Bullion III M.D   On: 04/02/2017 13:52   Ct Head Wo Contrast  Result Date: 04/02/2017 CLINICAL DATA:  83 year old female with altered level of consciousness and headache. EXAM: CT HEAD WITHOUT CONTRAST TECHNIQUE: Contiguous axial images were obtained from the base of the skull through the vertex without intravenous contrast. COMPARISON:  08/13/2014 MR, 08/12/2014 CT and prior studies FINDINGS: Brain: No evidence of acute infarction, hemorrhage, hydrocephalus, extra-axial collection or mass lesion/mass effect. Atrophy, chronic small-vessel white matter ischemic changes and remote right frontal, left occipital, left basal  ganglia and left frontoparietal infarcts noted. Vascular: Intracranial atherosclerotic calcifications noted Skull: Normal. Negative for fracture or focal lesion. Sinuses/Orbits: Single opacified posterior left ethmoid air cell again noted. No acute abnormality Other: None IMPRESSION: 1. No evidence of acute intracranial abnormality 2. Atrophy, chronic small-vessel white matter ischemic changes and remote infarcts as described. Electronically Signed   By: Margarette Canada M.D.   On: 04/02/2017 14:10   Mr Jeri Cos LF  Contrast  Result Date: 04/03/2017 CLINICAL DATA:  Initial evaluation for acute speech difficulty. EXAM: MRI HEAD WITHOUT AND WITH CONTRAST TECHNIQUE: Multiplanar, multiecho pulse sequences of the brain and surrounding structures were obtained without and with intravenous contrast. CONTRAST:  86mL MULTIHANCE GADOBENATE DIMEGLUMINE 529 MG/ML IV SOLN COMPARISON:  Prior MRI from 08/13/2014. FINDINGS: Brain: Generalized age related cerebral atrophy, progressed relative to 2 out 16. Patchy and confluent T2/FLAIR hyperintensity within the periventricular and deep white matter both cerebral hemispheres, most consistent with chronic small vessel ischemic disease, also progressed relative to previous. Chronic microvascular ischemic changes present within the pons. Superimposed remote right frontal, left parietal, and left occipital lobe cortical infarcts. Small remote lacunar infarcts present at the bilateral lentiform nuclei. Tiny remote bilateral cerebellar infarcts noted. Mild scattered chronic blood products noted about a few of these remote infarcts. Patchy moderate-size diffusion abnormality seen involving the cortical gray matter and underlying deep and periventricular white matter of the posterior left MCA distribution, consistent with acute ischemic infarcts. There is patchy involvement of the posterior insula, left frontal operculum, left periatrial white matter, and posterior left frontoparietal region. No associated mass effect or hemorrhage. No mass lesion, midline shift or mass effect. Mild diffuse ventricular prominence related global parenchymal volume loss of hydrocephalus. No extra-axial fluid collection. Major dural sinuses are patent. No abnormal enhancement. Pituitary gland suprasellar region normal. Midline structures intact and within normal limits. Vascular: Major intracranial vascular flow voids are maintained Skull and upper cervical spine: Craniocervical junction within normal limits. Visualized  upper cervical spine unremarkable. Bone marrow signal intensity within normal limits. No scalp soft tissue abnormality. Sinuses/Orbits: Globes and orbital soft tissues within normal limits. Patient status post lens extraction on the right. Scattered mucosal thickening throughout the paranasal sinuses, worse on the left. No air-fluid level to suggest acute sinusitis. No appreciable mastoid effusion. Inner ear structures grossly normal. Other: None. IMPRESSION: 1. Patchy moderate sized acute left MCA territory infarcts involving the posterior left frontal parietal region. No associated hemorrhage or mass effect. 2. Multiple chronic infarcts involving the bilateral cerebral and cerebellar hemispheres as above. 3. Generalized cerebral atrophy with moderate chronic small vessel ischemic disease, progressed relative to 2016. Electronically Signed   By: Jeannine Boga M.D.   On: 04/03/2017 21:18   US Carotid Bilateral  Result Date: 04/02/2017 CLINICAL DATA:  Cerebral infarction. EXAM: BILATERAL CAROTID DUPLEX ULTRASOUND TECHNIQUE: Pearline Cables scale imaging, color Doppler and duplex ultrasound were performed of bilateral carotid and vertebral arteries in the neck. COMPARISON:  August 13, 2014 FINDINGS: Criteria: Quantification of carotid stenosis is based on velocity parameters that correlate the residual internal carotid diameter with NASCET-based stenosis levels, using the diameter of the distal internal carotid lumen as the denominator for stenosis measurement. The following velocity measurements were obtained: RIGHT ICA:  210/42 cm/sec CCA:  81/0 cm/sec SYSTOLIC ICA/CCA RATIO:  3.6 DIASTOLIC ICA/CCA RATIO:  4.8 ECA:  119 cm/sec LEFT ICA:  104/14 cm/sec CCA:  17/5 cm/sec SYSTOLIC ICA/CCA RATIO:  1.4 DIASTOLIC ICA/CCA RATIO:  1.6 ECA:  125 cm/sec RIGHT CAROTID  ARTERY: Severe calcified plaque at the right carotid bifurcation extending into the proximal ICA. There is significant luminal narrowing by grayscale imaging in  the proximal ICA on the right. RIGHT VERTEBRAL ARTERY:  Antegrade flow LEFT CAROTID ARTERY: Moderate heterogeneous atherosclerosis and plaque formation on the left with no hemodynamically significant stenosis in the left ICA. LEFT VERTEBRAL ARTERY:  Antegrade flow IMPRESSION: 1. Significant calcified plaque is seen on the right, particularly in the bulb and proximal ICA. Greater than 70% stenosis by velocities and visualization was seen on the August 13, 2014 study. Visually there has been no change on grayscale imaging. However, the velocities suggest a stenosis of between 50 and 69% on today's study. I doubt there has been significant change and the findings are consistent with a hemodynamically significant stenosis. A CTA may better quantify the degree of stenosis if clinically warranted. 2. Calcified atherosclerosis on the left with left ICA narrowing of less than 50%. Electronically Signed   By: Dorise Bullion III M.D   On: 04/02/2017 19:07    Medications:  I have reviewed the patient's current medications. Scheduled: . aspirin  325 mg Oral Daily  . clopidogrel  75 mg Oral Daily  . heparin  5,000 Units Subcutaneous Q8H  . mouth rinse  15 mL Mouth Rinse BID  . pneumococcal 23 valent vaccine  0.5 mL Intramuscular Tomorrow-1000    Assessment/Plan: Patient improved today.  More alert.  Moving right side better but no speech.  MRI of the brain reviewed and shows acute, patchy left MCA infarcts.  Likely embolic in etiology.  Echocardiogram was technically difficult but showed no intracardiac sources of emboli with EF of 55-65%.    Recommendations: 1.  Continue ASA and Plavix but change ASA to 81mg  now that able to take po 2.  Continue telemetry and therapy.   3.  If telemetry unremarkable during this hospitalization would consider long term monitoring on an outpatient basis.  4.  Patient to follow up with neurology on an outpatient basis    LOS: 0 days   Alexis Goodell,  MD Neurology [phone] 04/04/2017  11:50 AM

## 2017-04-04 NOTE — Progress Notes (Signed)
Noted medical plan and palliative care consult. I will sign off at this time. [phone]

## 2017-05-02 ENCOUNTER — Emergency Department
Admission: EM | Admit: 2017-05-02 | Discharge: 2017-05-02 | Disposition: A | Discharge status: Home / Self Care | Payer: Medicare Other | Attending: Emergency Medicine | Admitting: Emergency Medicine

## 2017-05-02 ENCOUNTER — Emergency Department: Payer: Medicare Other

## 2017-05-02 ENCOUNTER — Encounter: Payer: Self-pay | Admitting: Emergency Medicine

## 2017-05-02 DIAGNOSIS — R4182 Altered mental status, unspecified: Secondary | ICD-10-CM | POA: Diagnosis not present

## 2017-05-02 DIAGNOSIS — R51 Headache: Secondary | ICD-10-CM | POA: Insufficient documentation

## 2017-05-02 DIAGNOSIS — E119 Type 2 diabetes mellitus without complications: Secondary | ICD-10-CM | POA: Diagnosis not present

## 2017-05-02 DIAGNOSIS — R110 Nausea: Secondary | ICD-10-CM | POA: Insufficient documentation

## 2017-05-02 DIAGNOSIS — R519 Headache, unspecified: Secondary | ICD-10-CM

## 2017-05-02 DIAGNOSIS — I2510 Atherosclerotic heart disease of native coronary artery without angina pectoris: Secondary | ICD-10-CM | POA: Diagnosis not present

## 2017-05-02 DIAGNOSIS — Z87891 Personal history of nicotine dependence: Secondary | ICD-10-CM | POA: Insufficient documentation

## 2017-05-02 DIAGNOSIS — I10 Essential (primary) hypertension: Secondary | ICD-10-CM | POA: Insufficient documentation

## 2017-05-02 DIAGNOSIS — Z8673 Personal history of transient ischemic attack (TIA), and cerebral infarction without residual deficits: Secondary | ICD-10-CM | POA: Insufficient documentation

## 2017-05-02 DIAGNOSIS — Z79899 Other long term (current) drug therapy: Secondary | ICD-10-CM | POA: Insufficient documentation

## 2017-05-02 DIAGNOSIS — R1084 Generalized abdominal pain: Secondary | ICD-10-CM

## 2017-05-02 LAB — COMPREHENSIVE METABOLIC PANEL
ALBUMIN: 3.6 g/dL (ref 3.5–5.0)
ALK PHOS: 65 U/L (ref 38–126)
ALT: 12 U/L — ABNORMAL LOW (ref 14–54)
ANION GAP: 7 (ref 5–15)
AST: 31 U/L (ref 15–41)
BUN: 19 mg/dL (ref 6–20)
CALCIUM: 8.7 mg/dL — AB (ref 8.9–10.3)
CO2: 25 mmol/L (ref 22–32)
Chloride: 107 mmol/L (ref 101–111)
Creatinine, Ser: 0.67 mg/dL (ref 0.44–1.00)
GFR calc Af Amer: >60 mL/min (ref >60)
GFR calc non Af Amer: >60 mL/min (ref >60)
GLUCOSE: 195 mg/dL — AB (ref 65–99)
Potassium: 3.9 mmol/L (ref 3.5–5.1)
SODIUM: 139 mmol/L (ref 135–145)
Total Bilirubin: 0.6 mg/dL (ref 0.3–1.2)
Total Protein: 6.1 g/dL — ABNORMAL LOW (ref 6.5–8.1)

## 2017-05-02 LAB — CBC
HEMATOCRIT: 34.9 % — AB (ref 35.0–47.0)
HEMOGLOBIN: 11.7 g/dL — AB (ref 12.0–16.0)
MCH: 29.7 pg (ref 26.0–34.0)
MCHC: 33.6 g/dL (ref 32.0–36.0)
MCV: 88.3 fL (ref 80.0–100.0)
Platelets: 165 10*3/uL (ref 150–440)
RBC: 3.95 MIL/uL (ref 3.80–5.20)
RDW: 14.1 % (ref 11.5–14.5)
WBC: 4.4 10*3/uL (ref 3.6–11.0)

## 2017-05-02 LAB — URINALYSIS, COMPLETE (UACMP) WITH MICROSCOPIC
Bacteria, UA: NONE SEEN
Bilirubin Urine: NEGATIVE
Glucose, UA: 150 mg/dL — AB
Hgb urine dipstick: NEGATIVE
Ketones, ur: NEGATIVE mg/dL
Leukocytes, UA: NEGATIVE
Nitrite: NEGATIVE
PROTEIN: NEGATIVE mg/dL
SQUAMOUS EPITHELIAL / LPF: NONE SEEN
Specific Gravity, Urine: 1.01 (ref 1.005–1.030)
pH: 7 (ref 5.0–8.0)

## 2017-05-02 LAB — TROPONIN I: Troponin I: 0.03 ng/mL (ref <0.03)

## 2017-05-02 LAB — LIPASE, BLOOD: LIPASE: 31 U/L (ref 11–51)

## 2017-05-02 MED ORDER — METOCLOPRAMIDE HCL 10 MG PO TABS: 10.0000 mg | ORAL_TABLET | Freq: Three times a day (TID) | ORAL | 0 refills | Status: DC | PRN

## 2017-05-02 MED ORDER — METOCLOPRAMIDE HCL 5 MG/ML IJ SOLN
10.0000 mg | Freq: Once | INTRAMUSCULAR | Status: AC
  Administered 2017-05-02: 10 mg via INTRAVENOUS
  Filled 2017-05-02: qty 2

## 2017-05-02 MED ORDER — SODIUM CHLORIDE 0.9 % IV BOLUS (SEPSIS)
500.0000 mL | Freq: Once | INTRAVENOUS | Status: AC
  Administered 2017-05-02: 500 mL via INTRAVENOUS

## 2017-05-02 NOTE — ED Provider Notes (Signed)
[facility] Emergency Department Provider Note   ____________________________________________   First MD Initiated Contact with Patient 05/02/17 0300     (approximate)  I have reviewed the triage vital signs and the nursing notes.   HISTORY  Chief Complaint Altered Mental Status    HPI Yolanda Turner is a 83 y.o. female who comes into the hospital today with nausea and headache.  The patient was seen here a week ago with a similar complaint.  She had nausea and headache and was discovered to have a stroke.  The patient had some difficulty with her speech as well at the time.  Today the patient is complaining of nausea and headache.  Her initial blood pressures were in the 200s over 100s but EMS states that they did improve with them.  She was given Zofran 4 mg for nausea.  The patient states that she is still nauseous.  She is nonverbal but she does shake her head.  She denies any chest pain, abdominal pain and she actively denies headache at this time.  She also is not having any shortness of breath.  The patient is having nausea and is here for evaluation.  Past Medical History:  Diagnosis Date  . Carotid artery stenosis   . Carotid artery stenosis   . Collagen vascular disease ([place])   . Coronary artery disease   . Diabetes mellitus without complication (HCC)    diet control  . GERD (gastroesophageal reflux disease)   . Hypertension   . Myocardial infarction ([place])   . Osteoporosis   . Peripheral vascular disease ([place])   . Pneumonia   . Renal artery stenosis ([place])   . Stroke ([place])   . Uterine cancer [facility])     Patient Active Problem List   Diagnosis Date Noted  . Acute CVA (cerebrovascular accident) ([place]) 04/04/2017  . Speech problem 04/02/2017  . Confusion 04/02/2017  . TIA (transient ischemic attack) 04/02/2017    Past Surgical History:  Procedure Laterality Date  . ABDOMINAL HYSTERECTOMY    . CAROTID ENDARTERECTOMY    . Carotid  PTA/Stent Intervention Right 01/22/2015   Performed by Katha Cabal, MD at [facility]  . CATARACT EXTRACTION PHACO AND INTRAOCULAR LENS PLACEMENT ([place]) Right 09/28/2015   Performed by Estill Cotta, MD at [facility]S  . CORONARY ARTERY BYPASS GRAFT    . uterine cancer surgery      Prior to Admission medications   Medication Sig Start Date End Date Taking? Authorizing Provider  gabapentin (NEURONTIN) 250 MG/5ML solution Take 2 mLs every 8 (eight) hours by mouth. 04/04/17  Yes [provider]  lisinopril (PRINIVIL,ZESTRIL) 10 MG tablet Take 10 mg daily by mouth. 03/07/17  Yes [provider]  metoprolol succinate (TOPROL-XL) 50 MG 24 hr tablet Take 25 mg daily by mouth. 04/08/17  Yes [provider]  simvastatin (ZOCOR) 40 MG tablet Take 40 mg daily by mouth. 04/08/17  Yes [provider]  metoCLOPramide (REGLAN) 10 MG tablet Take 1 tablet (10 mg total) every 8 (eight) hours as needed by mouth. 05/02/17   Loney Hering, MD    Allergies Penicillins  Family History  Problem Relation Age of Onset  . Lung cancer Daughter     Social History Social History   Tobacco Use  . Smoking status: Former Research scientist (life sciences)  . Smokeless tobacco: Never Used  Substance Use Topics  . Alcohol use: No  . Drug use: No    Review of  Systems  Constitutional: No fever/chills Eyes: No visual changes. ENT: No sore throat. Cardiovascular: Denies chest pain. Respiratory: Denies shortness of breath. Gastrointestinal: Nausea with No abdominal pain. no vomiting.  No diarrhea.  No constipation. Genitourinary: Negative for dysuria. Musculoskeletal: Negative for back pain. Skin: Negative for rash. Neurological: Headache   ____________________________________________   PHYSICAL EXAM:  VITAL SIGNS: ED Triage Vitals  Enc Vitals Group     BP 05/02/17 0304 (!) 170/60     Pulse Rate 05/02/17 0304 71     Resp 05/02/17 0304 13     Temp 05/02/17 0304 98.1 F  (36.7 C)     Temp Source 05/02/17 0304 Oral     SpO2 05/02/17 0304 99 %     Weight 05/02/17 0306 89 lb 4.8 oz (40.5 kg)     Height 05/02/17 0306 5\' 5"  (1.651 m)     Head Circumference --      Peak Flow --      Pain Score --      Pain Loc --      Pain Edu? --      Excl. in [place]? --     Constitutional: Alert and oriented. Well appearing and in mild distress. Patient with facial grimacing but denies pain Eyes: Conjunctivae are normal. PERRL. EOMI. Head: Atraumatic. Nose: No congestion/rhinnorhea. Mouth/Throat: Mucous membranes are moist.  Oropharynx non-erythematous. Cardiovascular: Normal rate, regular rhythm. Grossly normal heart sounds.  Good peripheral circulation. Respiratory: Normal respiratory effort.  No retractions. Lungs CTAB. Gastrointestinal: Soft and nontender. No distention. Positive bowel sounds Musculoskeletal: No lower extremity tenderness nor edema.   Neurologic:  Non verbal, facial sensation intact his strength and tongue deviation intact shrug is intact flexion and extension is 5 out of 5 with no pronator drift Skin:  Skin is warm, dry and intact.  Psychiatric: Mood and affect are normal.   ____________________________________________   LABS (all labs ordered are listed, but only abnormal results are displayed)  Labs Reviewed  COMPREHENSIVE METABOLIC PANEL - Abnormal; Notable for the following components:      Result Value   Glucose, Bld 195 (*)    Calcium 8.7 (*)    Total Protein 6.1 (*)    ALT 12 (*)    All other components within normal limits  CBC - Abnormal; Notable for the following components:   Hemoglobin 11.7 (*)    HCT 34.9 (*)    All other components within normal limits  URINALYSIS, COMPLETE (UACMP) WITH MICROSCOPIC - Abnormal; Notable for the following components:   Color, Urine STRAW (*)    APPearance CLEAR (*)    Glucose, UA 150 (*)    All other components within normal limits  TROPONIN I - Abnormal; Notable for the following components:     Troponin I 0.03 (*)    All other components within normal limits  LIPASE, BLOOD  TROPONIN I   ____________________________________________  EKG  ED ECG REPORT I, Loney Hering, the attending physician, personally viewed and interpreted this ECG.   Date: 05/02/2017  EKG Time: 306  Rate: 74  Rhythm: normal sinus rhythm  Axis: left axis deviation  Intervals:left bundle branch block and nonspecific intraventricular conduction delay seen 10/21  ST&T Change: LBBB  ____________________________________________  RADIOLOGY  Ct Head Wo Contrast  Result Date: 05/02/2017 CLINICAL DATA:  83 year old female with headache and altered mental status. EXAM: CT HEAD WITHOUT CONTRAST TECHNIQUE: Contiguous axial images were obtained from the base of the skull through the vertex without intravenous  contrast. COMPARISON:  Brain MRI dated 04/03/2017 abdominal CT dated 04/02/2017 FINDINGS: Brain: There is age-related atrophy and chronic microvascular ischemic changes. Areas of old infarct and encephalomalacia involving the right frontal lobe, left MCA territory, and left occipital lobe noted. Left basal ganglia small old lacunar infarct. There is no acute intracranial hemorrhage. No mass effect or midline shift. No extra-axial fluid collection. Vascular: No hyperdense vessel or unexpected calcification. Skull: Normal. Negative for fracture or focal lesion. Sinuses/Orbits: There is opacification of multiple ethmoid air cells. No air-fluid levels. The mastoid air cells are clear. Other: None IMPRESSION: 1. No acute intracranial hemorrhage. 2. Age-related atrophy and chronic microvascular ischemic changes. Multiple bilateral old infarcts. Please note: Noncontrast CT has low sensitivity for detection of small lesions or acute ischemia. If symptoms persist, and there are no contraindications, MRI may provide better evaluation if clinically indicated. Electronically Signed   By: Anner Crete M.D.   On:  05/02/2017 03:49   Dg Chest Portable 1 View  Result Date: 05/02/2017 CLINICAL DATA:  Altered mental status. EXAM: PORTABLE CHEST 1 VIEW COMPARISON:  04/02/2017 FINDINGS: Postoperative changes in the mediastinum. Emphysematous changes and fibrosis in the lungs. Normal heart size and pulmonary vascularity. No focal airspace disease or consolidation in the lungs. No blunting of costophrenic angles. No pneumothorax. Mediastinal contours appear intact. Calcification and pleural thickening in the apices consistent with postinflammatory process. Calcified and tortuous aorta. IMPRESSION: Emphysematous changes and chronic fibrosis in the lungs. No evidence of active pulmonary disease. Aortic atherosclerosis. Electronically Signed   By: Lucienne Capers M.D.   On: 05/02/2017 03:22    ____________________________________________   PROCEDURES  Procedure(s) performed: None  Procedures  Critical Care performed: No  ____________________________________________   INITIAL IMPRESSION / ASSESSMENT AND PLAN / ED COURSE  As part of my medical decision making, I reviewed the following data within the electronic MEDICAL RECORD NUMBER Notes from prior ED visits and [place] Controlled Substance Database   This is an 83 year old female who comes into the hospital today with some nausea and headache.  The patient stroke previously presented in this way.  The patient's differential diagnosis includes gastritis, stroke, acute coronary syndrome.    The patient's blood work is unremarkable the patient's CT head is unremarkable aside from old infarcts in the patient's chest x-ray is negative.  I did give the patient a dose of Reglan in the emergency department and I will give her a dose of 500 mL bolus of normal saline.  She will be reassessed and have a repeat troponin.  The patient's repeat troponin was unremarkable.  The patient states that she feels improved after her fluid and her Reglan.  She never stated she had  abdominal pain although the family stated she complained of abdominal pain at home.  She will be discharged home to follow-up with her primary care physician. ____________________________________________   FINAL CLINICAL IMPRESSION(S) / ED DIAGNOSES  Final diagnoses:  Nausea  Acute nonintractable headache, unspecified headache type  Generalized abdominal pain     ED Discharge Orders        Ordered    metoCLOPramide (REGLAN) 10 MG tablet  Every 8 hours PRN     05/02/17 0823       Note:  This document was prepared using Dragon voice recognition software and may include unintentional dictation errors.    Loney Hering, MD 05/02/17 [phone]

## 2017-05-02 NOTE — ED Triage Notes (Signed)
Pt arrived via ems from home. Pt lives with daughter who called ems with worries of a new stroke. Pt has had a stroke in the past and has speech deficits. Pt was given 4mg  of zofran in route. Pt responds appropriately with nodding.

## 2017-05-02 NOTE — Discharge Instructions (Signed)
Please follow up with your primary care physician.

## 2017-05-02 NOTE — ED Notes (Signed)
Family at bedside. Per family report pt woke up family and told them she wanted to go to the hospital because her stomach hurt.

## 2017-05-02 NOTE — ED Notes (Signed)
Dr. Reita Cliche notified in person of troponin 0.03.

## 2018-01-02 ENCOUNTER — Other Ambulatory Visit: Payer: Self-pay

## 2018-01-02 ENCOUNTER — Encounter: Payer: Self-pay | Admitting: Intensive Care

## 2018-01-02 ENCOUNTER — Emergency Department: Payer: Medicare Other

## 2018-01-02 ENCOUNTER — Emergency Department
Admission: EM | Admit: 2018-01-02 | Discharge: 2018-01-02 | Disposition: A | Discharge status: Home / Self Care | Payer: Medicare Other | Attending: Emergency Medicine | Admitting: Emergency Medicine

## 2018-01-02 DIAGNOSIS — Z951 Presence of aortocoronary bypass graft: Secondary | ICD-10-CM | POA: Insufficient documentation

## 2018-01-02 DIAGNOSIS — Z87891 Personal history of nicotine dependence: Secondary | ICD-10-CM | POA: Insufficient documentation

## 2018-01-02 DIAGNOSIS — M25552 Pain in left hip: Secondary | ICD-10-CM | POA: Diagnosis not present

## 2018-01-02 DIAGNOSIS — I10 Essential (primary) hypertension: Secondary | ICD-10-CM | POA: Diagnosis not present

## 2018-01-02 DIAGNOSIS — E119 Type 2 diabetes mellitus without complications: Secondary | ICD-10-CM | POA: Insufficient documentation

## 2018-01-02 DIAGNOSIS — Z79899 Other long term (current) drug therapy: Secondary | ICD-10-CM | POA: Diagnosis not present

## 2018-01-02 DIAGNOSIS — I2510 Atherosclerotic heart disease of native coronary artery without angina pectoris: Secondary | ICD-10-CM | POA: Insufficient documentation

## 2018-01-02 NOTE — ED Provider Notes (Signed)
[facility] Emergency Department Provider Note   ____________________________________________    I have reviewed the triage vital signs and the nursing notes.   HISTORY  Chief Complaint Hip Pain (left)   History of dementia, limits HPI  HPI Yolanda Turner is a 84 y.o. female who presents with complaints of left hip pain.  Per family patient was complaining of severe left hip pain early this morning and it was making it difficult for her to ambulate.  Now the patient states she is feeling somewhat better.  No reported recent falls.  No other complaints.  No abdominal pain.  No dysuria.  Has not taken anything for this   Past Medical History:  Diagnosis Date  . Carotid artery stenosis   . Carotid artery stenosis   . Collagen vascular disease ([place])   . Coronary artery disease   . Diabetes mellitus without complication (HCC)    diet control  . GERD (gastroesophageal reflux disease)   . Hypertension   . Myocardial infarction ([place])   . Osteoporosis   . Peripheral vascular disease ([place])   . Pneumonia   . Renal artery stenosis ([place])   . Stroke ([place])   . Uterine cancer [facility])     Patient Active Problem List   Diagnosis Date Noted  . Acute CVA (cerebrovascular accident) ([place]) 04/04/2017  . Speech problem 04/02/2017  . Confusion 04/02/2017  . TIA (transient ischemic attack) 04/02/2017    Past Surgical History:  Procedure Laterality Date  . ABDOMINAL HYSTERECTOMY    . CAROTID ENDARTERECTOMY    . CATARACT EXTRACTION W/PHACO Right 09/28/2015   Procedure: CATARACT EXTRACTION PHACO AND INTRAOCULAR LENS PLACEMENT (I[facility]);  Surgeon: Estill Cotta, MD;  Location: [facility] ORS;  Service: Ophthalmology;  Laterality: Right;  [place] 041:18 AP% 28.6 CDE 114.94 fluid pack lot # [id number]  . CORONARY ARTERY BYPASS GRAFT    . PERIPHERAL VASCULAR CATHETERIZATION Right 01/22/2015   Procedure: Carotid PTA/Stent Intervention;  Surgeon: Katha Cabal, MD;  Location:  [facility];  Service: Cardiovascular;  Laterality: Right;  . uterine cancer surgery      Prior to Admission medications   Medication Sig Start Date End Date Taking? Authorizing Provider  clopidogrel (PLAVIX) 75 MG tablet Take 75 mg by mouth at bedtime.   Yes [provider]  gabapentin (NEURONTIN) 300 MG capsule Take 300 mg by mouth at bedtime.   Yes [provider]  lisinopril (PRINIVIL,ZESTRIL) 10 MG tablet Take 10 mg by mouth at bedtime.    Yes [provider]  metoCLOPramide (REGLAN) 10 MG tablet Take 1 tablet (10 mg total) every 8 (eight) hours as needed by mouth. Patient taking differently: Take 10 mg by mouth every 8 (eight) hours as needed for nausea or vomiting.  05/02/17  Yes Loney Hering, MD  metoprolol succinate (TOPROL-XL) 50 MG 24 hr tablet Take 25 mg by mouth at bedtime.    Yes [provider]  simvastatin (ZOCOR) 40 MG tablet Take 40 mg by mouth at bedtime.    Yes [provider]     Allergies Penicillins  Family History  Problem Relation Age of Onset  . Lung cancer Daughter     Social History Social History   Tobacco Use  . Smoking status: Former Research scientist (life sciences)  . Smokeless tobacco: Never Used  Substance Use Topics  . Alcohol use: No  . Drug use: No    Review of Systems  Constitutional: No reports of fever Eyes: No  visual changes.  ENT:  no neck pain Cardiovascular: No chest pain Respiratory: No cough Gastrointestinal: No abdominal pain.    Genitourinary: Negative for dysuria. Musculoskeletal: Hip pain as above Skin: Negative for rash. Neurological: Negative for headaches   ____________________________________________   PHYSICAL EXAM:  VITAL SIGNS: ED Triage Vitals  Enc Vitals Group     BP 01/02/18 1035 (!) 184/69     Pulse Rate 01/02/18 1035 65     Resp 01/02/18 1035 14     Temp 01/02/18 1035 (!) 97.5 F (36.4 C)     Temp Source 01/02/18 1035 Oral     SpO2 01/02/18 1035 100 %      Weight 01/02/18 1038 40.8 kg (90 lb)     Height 01/02/18 1038 1.651 m (5\' 5" )     Head Circumference --      Peak Flow --      Pain Score 01/02/18 1038 8     Pain Loc --      Pain Edu? --      Excl. in [place]? --     Constitutional: Alert and oriented. No acute distress. Pleasant and interactive  Head: Atraumatic. Nose: No congestion/rhinnorhea.    Neck:  Painless ROM Cardiovascular: Normal rate, regular rhythm.   Good peripheral circulation. Respiratory: Normal respiratory effort.  No retractions.   Musculoskeletal: No lower extremity tenderness nor edema, full range of motion.  Warm and well perfused.  Upper extremities with normal range of motion.  Very minimal tenderness over the left greater trochanter of the hip but full range of motion, no pain with axial load.  Patient is able to ambulate well Neurologic:  Normal speech and language. No gross focal neurologic deficits are appreciated.  Skin:  Skin is warm, dry and intact. Psychiatric: Mood and affect are normal. Speech and behavior are normal.  ____________________________________________   LABS (all labs ordered are listed, but only abnormal results are displayed)  Labs Reviewed - No data to display ____________________________________________  EKG  None ____________________________________________  RADIOLOGY  Pelvis x-ray is unremarkable ____________________________________________   PROCEDURES  Procedure(s) performed: No  Procedures   Critical Care performed: No ____________________________________________   INITIAL IMPRESSION / ASSESSMENT AND PLAN / ED COURSE  Pertinent labs & imaging results that were available during my care of the patient were reviewed by me and considered in my medical decision making (see chart for details).  Patient received pelvis x-ray for possible contusion/strain which was unremarkable.  I personally stood the patient up and helped her ambulate around the room which she did  without difficulty and at her baseline.  Family is relieved, recommend outpatient follow-up    ____________________________________________   FINAL CLINICAL IMPRESSION(S) / ED DIAGNOSES  Final diagnoses:  Left hip pain        Note:  This document was prepared using Dragon voice recognition software and may include unintentional dictation errors.    Lavonia Drafts, MD 01/02/18 1535

## 2018-01-02 NOTE — ED Triage Notes (Signed)
Patient c/o Left hip pain since yesterday morning. Denies recent injury/fall. Patient was able to ambulate with cane to EMS stretcher

## 2018-01-02 NOTE — ED Notes (Signed)
X-ray at bedside

## 2018-02-02 ENCOUNTER — Encounter (INDEPENDENT_AMBULATORY_CARE_PROVIDER_SITE_OTHER): Payer: Self-pay | Admitting: Vascular Surgery

## 2018-02-02 ENCOUNTER — Ambulatory Visit (INDEPENDENT_AMBULATORY_CARE_PROVIDER_SITE_OTHER): Payer: Medicare Other | Admitting: Vascular Surgery

## 2018-02-02 VITALS — BP 123/59 | HR 65 | Resp 15 | Ht 61.0 in | Wt 73.8 lb

## 2018-02-02 DIAGNOSIS — I6529 Occlusion and stenosis of unspecified carotid artery: Secondary | ICD-10-CM | POA: Insufficient documentation

## 2018-02-02 DIAGNOSIS — I6523 Occlusion and stenosis of bilateral carotid arteries: Secondary | ICD-10-CM | POA: Diagnosis not present

## 2018-02-02 DIAGNOSIS — I10 Essential (primary) hypertension: Secondary | ICD-10-CM | POA: Insufficient documentation

## 2018-02-02 DIAGNOSIS — E119 Type 2 diabetes mellitus without complications: Secondary | ICD-10-CM | POA: Insufficient documentation

## 2018-02-02 DIAGNOSIS — G459 Transient cerebral ischemic attack, unspecified: Secondary | ICD-10-CM

## 2018-02-02 NOTE — Progress Notes (Signed)
Patient ID: Yolanda Turner, female   DOB: Jul 10, 1925, 84 y.o.   MRN: [id number]  Chief Complaint  Patient presents with  . New Patient (Initial Visit)    ref carotid stenosis    HPI [name] is a 84 y.o. female.  I am asked to see the patient by Dr. Hall Busing for evaluation of carotid stenosis.  The patient reports an episode of about 10 seconds where she became very confused and had a headache.  She was then tired and went and laid down.  Her blood pressure was elevated.  She was told she had a TIA and should get this checked on.  She has a known history of carotid disease and had a left carotid endarterectomy performed by Dr. Delana Meyer in 2014.  Last time that I can see that she was seen was 2016 when she was found to have moderate right carotid artery stenosis with an angiogram and no surgery was required.  The family does not think it has been checked since then.  She remains relatively active despite her advanced age and small size.  She has lost some weight.   Past Medical History:  Diagnosis Date  . Carotid artery stenosis   . Carotid artery stenosis   . Collagen vascular disease ([place])   . Coronary artery disease   . Diabetes mellitus without complication (HCC)    diet control  . GERD (gastroesophageal reflux disease)   . Hypertension   . Myocardial infarction ([place])   . Osteoporosis   . Peripheral vascular disease ([place])   . Pneumonia   . Renal artery stenosis ([place])   . Stroke ([place])   . Uterine cancer [facility])     Past Surgical History:  Procedure Laterality Date  . ABDOMINAL HYSTERECTOMY    . CAROTID ENDARTERECTOMY    . CATARACT EXTRACTION W/PHACO Right 09/28/2015   Procedure: CATARACT EXTRACTION PHACO AND INTRAOCULAR LENS PLACEMENT (IOC);  Surgeon: Estill Cotta, MD;  Location: [facility] ORS;  Service: Ophthalmology;  Laterality: Right;  [place] 041:18 AP% 28.6 CDE 114.94 fluid pack lot # [id number]  . CORONARY ARTERY BYPASS GRAFT    . PERIPHERAL VASCULAR CATHETERIZATION Right  01/22/2015   Procedure: Carotid PTA/Stent Intervention;  Surgeon: Katha Cabal, MD;  Location: [place] [facility];  Service: Cardiovascular;  Laterality: Right;  . uterine cancer surgery      Family History  Problem Relation Age of Onset  . Lung cancer Daughter   no bleeding disorders, clotting disorders, aneurysms, or autoimmune diseases   Social History   Tobacco Use  . Smoking status: Former Research scientist (life sciences)  . Smokeless tobacco: Never Used  Substance Use Topics  . Alcohol use: No  . Drug use: No    Allergies  Allergen Reactions  . Penicillins Rash    Has patient had a PCN reaction causing immediate rash, facial/tongue/throat swelling, SOB or lightheadedness with hypotension: No Has patient had a PCN reaction causing severe rash involving mucus membranes or skin necrosis: No Has patient had a PCN reaction that required hospitalization: No Has patient had a PCN reaction occurring within the last 10 years: No If all of the above answers are "NO", then may proceed with Cephalosporin use.    Current Outpatient Medications  Medication Sig Dispense Refill  . aspirin EC 81 MG tablet Take 81 mg by mouth daily.    . clopidogrel (PLAVIX) 75 MG tablet Take 75 mg by mouth at bedtime.    . gabapentin (NEURONTIN) 300 MG capsule  Take 300 mg by mouth at bedtime.    Marland Kitchen lisinopril (PRINIVIL,ZESTRIL) 10 MG tablet Take 10 mg by mouth at bedtime.   1  . metoCLOPramide (REGLAN) 10 MG tablet Take 1 tablet (10 mg total) every 8 (eight) hours as needed by mouth. (Patient taking differently: Take 10 mg by mouth every 8 (eight) hours as needed for nausea or vomiting. ) 20 tablet 0  . metoprolol succinate (TOPROL-XL) 50 MG 24 hr tablet Take 25 mg by mouth at bedtime.   5  . simvastatin (ZOCOR) 40 MG tablet Take 40 mg by mouth at bedtime.   5   No current facility-administered medications for this visit.       REVIEW OF SYSTEMS (Negative unless checked)  Constitutional: [x] Weight loss  [] Fever   [] Chills Cardiac: [] Chest pain   [] Chest pressure   [] Palpitations   [] Shortness of breath when laying flat   [] Shortness of breath at rest   [] Shortness of breath with exertion. Vascular:  [] Pain in legs with walking   [] Pain in legs at rest   [] Pain in legs when laying flat   [] Claudication   [] Pain in feet when walking  [] Pain in feet at rest  [] Pain in feet when laying flat   [] History of DVT   [] Phlebitis   [] Swelling in legs   [] Varicose veins   [] Non-healing ulcers Pulmonary:   [] Uses home oxygen   [] Productive cough   [] Hemoptysis   [] Wheeze  [] COPD   [] Asthma Neurologic:  [] Dizziness  [] Blackouts   [] Seizures   [x] History of stroke   [x] History of TIA  [] Aphasia   [] Temporary blindness   [] Dysphagia   [] Weakness or numbness in arms   [] Weakness or numbness in legs Musculoskeletal:  [x] Arthritis   [] Joint swelling   [] Joint pain   [] Low back pain Hematologic:  [] Easy bruising  [] Easy bleeding   [] Hypercoagulable state   [x] Anemic  [] Hepatitis Gastrointestinal:  [] Blood in stool   [] Vomiting blood  [x] Gastroesophageal reflux/heartburn   [] Abdominal pain Genitourinary:  [] Chronic kidney disease   [] Difficult urination  [] Frequent urination  [] Burning with urination   [] Hematuria Skin:  [] Rashes   [] Ulcers   [] Wounds Psychological:  [] History of anxiety   []  History of major depression.    Physical Exam BP (!) 123/59 (BP Location: Right Arm)   Pulse 65   Resp 15   Ht 5\' 1"  (1.549 m)   Wt 73 lb 12.8 oz (33.5 kg)   BMI 13.94 kg/m  Gen:  Cachectic, frail and elderly, NAD Head: [place]/AT, + temporalis wasting.  Ear/Nose/Throat: Hearing diminished, nares w/o erythema or drainage, oropharynx w/o Erythema/Exudate Eyes: Conjunctiva clear, sclera non-icteric  Neck: trachea midline.  Right carotid bruit is present Pulmonary:  Good air movement, clear to auscultation bilaterally.  Cardiac: RRR, no JVD Vascular:  Vessel Right Left  Radial Palpable Palpable                          PT 1+  Palpable 1+ Palpable  DP Palpable 1+ Palpable   Gastrointestinal: soft, non-tender/non-distended.  Musculoskeletal: M/S 5/5 throughout.  Extremities without ischemic changes.  No deformity or atrophy. No edema. Neurologic: Sensation grossly intact in extremities.  Symmetrical.  Speech is fluent. Motor exam as listed above. Psychiatric: Judgment intact, Mood & affect appropriate for pt's clinical situation. Dermatologic: No rashes or ulcers noted.  No cellulitis or open wounds.    Radiology No results found.  Labs No results found for this  or any previous visit (from the past 2160 hour(s)).  Assessment/Plan:  Hypertension blood pressure control important in reducing the progression of atherosclerotic disease. On appropriate oral medications.   TIA (transient ischemic attack) We will go ahead and reassess carotid disease.  She is clearly a very high risk patient but if this is symptomatic consideration for intervention may have to be given  Diabetes mellitus without complication ([place]) blood glucose control important in reducing the progression of atherosclerotic disease. Also, involved in wound healing. Diet controlled   Carotid artery stenosis She is status post left carotid endarterectomy in 2014 and had moderate disease in 2016.  Not sure it has been checked since that time.  We will obtain a carotid duplex in the near future at her convenience.  She is clearly a very high risk patient, but if she is symptomatic consideration for intervention may have to be given if this is indeed a high grade lesion.  Continue current medical regimen including aspirin, Plavix, and a statin agent.      Leotis Pain 02/02/2018, 11:37 AM   This note was created with Dragon medical transcription system.  Any errors from dictation are unintentional.

## 2018-02-02 NOTE — Patient Instructions (Signed)
Carotid Artery Disease The carotid arteries are arteries on both sides of the neck. They carry blood to the brain. Carotid artery disease is when the arteries get smaller (narrow) or get blocked. If these arteries get smaller or get blocked, you are more likely to have a stroke or warning stroke (transient ischemic attack). Follow these instructions at home:  Take medicines as told by your doctor. Make sure you understand all your medicine instructions. Do not stop your medicines without talking to your doctor first.  Follow your doctor's diet instructions. It is important to eat a healthy diet that includes plenty of: ? Fresh fruits. ? Vegetables. ? Lean meats.  Avoid: ? High-fat foods. ? High-sodium foods. ? Foods that are fried, overly processed, or have poor nutritional value.  Stay a healthy weight.  Stay active. Get at least 30 minutes of activity every day.  Do not smoke.  Limit alcohol use to: ? No more than 2 drinks a day for men. ? No more than 1 drink a day for women who are not pregnant.  Do not use illegal drugs.  Keep all doctor visits as told. Get help right away if:  You have sudden weakness or loss of feeling (numbness) on one side of the body, such as the face, arm, or leg.  You have sudden confusion.  You have trouble speaking (aphasia) or understanding.  You have sudden trouble seeing out of one or both eyes.  You have sudden trouble walking.  You have dizziness or feel like you might pass out (faint).  You have a loss of balance or your movements are not steady (uncoordinated).  You have a sudden, severe headache with no known cause.  You have trouble swallowing (dysphagia). Call your local emergency services (911 in [place] Do notdrive yourself to the clinic or hospital. This information is not intended to replace advice given to you by your health care provider. Make sure you discuss any questions you have with your health care  provider. Document Released: 05/16/2012 Document Revised: 11/05/2015 Document Reviewed: 11/28/2012 Elsevier Interactive Patient Education  2018 Elsevier Inc.  

## 2018-02-02 NOTE — Assessment & Plan Note (Signed)
She is status post left carotid endarterectomy in 2014 and had moderate disease in 2016.  Not sure it has been checked since that time.  We will obtain a carotid duplex in the near future at her convenience.  She is clearly a very high risk patient, but if she is symptomatic consideration for intervention may have to be given if this is indeed a high grade lesion.  Continue current medical regimen including aspirin, Plavix, and a statin agent.

## 2018-02-02 NOTE — Assessment & Plan Note (Signed)
blood pressure control important in reducing the progression of atherosclerotic disease. On appropriate oral medications.  

## 2018-02-02 NOTE — Assessment & Plan Note (Signed)
blood glucose control important in reducing the progression of atherosclerotic disease. Also, involved in wound healing. Diet controlled  

## 2018-02-02 NOTE — Assessment & Plan Note (Signed)
We will go ahead and reassess carotid disease.  She is clearly a very high risk patient but if this is symptomatic consideration for intervention may have to be given

## 2018-03-06 ENCOUNTER — Ambulatory Visit (INDEPENDENT_AMBULATORY_CARE_PROVIDER_SITE_OTHER): Payer: Medicare Other | Admitting: Vascular Surgery

## 2018-03-06 ENCOUNTER — Encounter (INDEPENDENT_AMBULATORY_CARE_PROVIDER_SITE_OTHER): Payer: Medicare Other

## 2018-03-23 ENCOUNTER — Ambulatory Visit (INDEPENDENT_AMBULATORY_CARE_PROVIDER_SITE_OTHER): Payer: Medicare Other

## 2018-03-23 ENCOUNTER — Ambulatory Visit (INDEPENDENT_AMBULATORY_CARE_PROVIDER_SITE_OTHER): Payer: Medicare Other | Admitting: Vascular Surgery

## 2018-03-23 ENCOUNTER — Encounter (INDEPENDENT_AMBULATORY_CARE_PROVIDER_SITE_OTHER): Payer: Self-pay | Admitting: Vascular Surgery

## 2018-03-23 VITALS — BP 109/67 | HR 66 | Resp 15 | Ht 61.0 in | Wt 73.6 lb

## 2018-03-23 DIAGNOSIS — G459 Transient cerebral ischemic attack, unspecified: Secondary | ICD-10-CM

## 2018-03-23 DIAGNOSIS — I6523 Occlusion and stenosis of bilateral carotid arteries: Secondary | ICD-10-CM

## 2018-03-23 DIAGNOSIS — I10 Essential (primary) hypertension: Secondary | ICD-10-CM

## 2018-03-23 DIAGNOSIS — E119 Type 2 diabetes mellitus without complications: Secondary | ICD-10-CM | POA: Diagnosis not present

## 2018-03-23 DIAGNOSIS — Z87891 Personal history of nicotine dependence: Secondary | ICD-10-CM

## 2018-03-23 NOTE — Progress Notes (Signed)
MRN : [id number]  Yolanda Turner is a 84 y.o. (11-13-1925) female who presents with chief complaint of  Chief Complaint  Patient presents with  . Follow-up    carotid ultrasound results  .  History of Present Illness: Patient returns in follow-up of her carotid disease.  She has had no major changes or problems since her last visit.  This was several weeks ago.  Her carotid duplex reveals stable, 60 to 79% right ICA stenosis and a patent left carotid endarterectomy.  Current Outpatient Medications  Medication Sig Dispense Refill  . aspirin EC 81 MG tablet Take 81 mg by mouth daily.    . clopidogrel (PLAVIX) 75 MG tablet Take 75 mg by mouth at bedtime.    . gabapentin (NEURONTIN) 300 MG capsule Take 300 mg by mouth at bedtime.    Marland Kitchen lisinopril (PRINIVIL,ZESTRIL) 10 MG tablet Take 10 mg by mouth at bedtime.   1  . metoCLOPramide (REGLAN) 10 MG tablet Take 1 tablet (10 mg total) every 8 (eight) hours as needed by mouth. (Patient taking differently: Take 10 mg by mouth every 8 (eight) hours as needed for nausea or vomiting. ) 20 tablet 0  . metoprolol succinate (TOPROL-XL) 50 MG 24 hr tablet Take 25 mg by mouth at bedtime.   5  . simvastatin (ZOCOR) 40 MG tablet Take 40 mg by mouth at bedtime.   5   No current facility-administered medications for this visit.     Past Medical History:  Diagnosis Date  . Carotid artery stenosis   . Carotid artery stenosis   . Collagen vascular disease ([place])   . Coronary artery disease   . Diabetes mellitus without complication (HCC)    diet control  . GERD (gastroesophageal reflux disease)   . Hypertension   . Myocardial infarction ([place])   . Osteoporosis   . Peripheral vascular disease ([place])   . Pneumonia   . Renal artery stenosis ([place])   . Stroke ([place])   . Uterine cancer [facility])     Past Surgical History:  Procedure Laterality Date  . ABDOMINAL HYSTERECTOMY    . CAROTID ENDARTERECTOMY    . CATARACT EXTRACTION W/PHACO Right 09/28/2015   Procedure: CATARACT EXTRACTION PHACO AND INTRAOCULAR LENS PLACEMENT (IOC);  Surgeon: Estill Cotta, MD;  Location: [facility] ORS;  Service: Ophthalmology;  Laterality: Right;  [place] 041:18 AP% 28.6 CDE 114.94 fluid pack lot # [id number]  . CORONARY ARTERY BYPASS GRAFT    . PERIPHERAL VASCULAR CATHETERIZATION Right 01/22/2015   Procedure: Carotid PTA/Stent Intervention;  Surgeon: Katha Cabal, MD;  Location: [place] [facility];  Service: Cardiovascular;  Laterality: Right;  . uterine cancer surgery            Family History  Problem Relation Age of Onset  . Lung cancer Daughter   no bleeding disorders, clotting disorders, aneurysms, or autoimmune diseases   Social History       Tobacco Use  . Smoking status: Former Research scientist (life sciences)  . Smokeless tobacco: Never Used  Substance Use Topics  . Alcohol use: No  . Drug use: No         Allergies  Allergen Reactions  . Penicillins Rash    Has patient had a PCN reaction causing immediate rash, facial/tongue/throat swelling, SOB or lightheadedness with hypotension: No Has patient had a PCN reaction causing severe rash involving mucus membranes or skin necrosis: No Has patient had a PCN reaction that required hospitalization: No Has patient had a PCN reaction  occurring within the last 10 years: No If all of the above answers are "NO", then may proceed with Cephalosporin use.      REVIEW OF SYSTEMS (Negative unless checked)  Constitutional: [x] Weight loss  [] Fever  [] Chills Cardiac: [] Chest pain   [] Chest pressure   [] Palpitations   [] Shortness of breath when laying flat   [] Shortness of breath at rest   [] Shortness of breath with exertion. Vascular:  [] Pain in legs with walking   [] Pain in legs at rest   [] Pain in legs when laying flat   [] Claudication   [] Pain in feet when walking  [] Pain in feet at rest  [] Pain in feet when laying flat   [] History of DVT   [] Phlebitis   [] Swelling in legs   [] Varicose veins   [] Non-healing  ulcers Pulmonary:   [] Uses home oxygen   [] Productive cough   [] Hemoptysis   [] Wheeze  [] COPD   [] Asthma Neurologic:  [] Dizziness  [] Blackouts   [] Seizures   [x] History of stroke   [x] History of TIA  [] Aphasia   [] Temporary blindness   [] Dysphagia   [] Weakness or numbness in arms   [] Weakness or numbness in legs Musculoskeletal:  [x] Arthritis   [] Joint swelling   [] Joint pain   [] Low back pain Hematologic:  [] Easy bruising  [] Easy bleeding   [] Hypercoagulable state   [x] Anemic  [] Hepatitis Gastrointestinal:  [] Blood in stool   [] Vomiting blood  [x] Gastroesophageal reflux/heartburn   [] Abdominal pain Genitourinary:  [] Chronic kidney disease   [] Difficult urination  [] Frequent urination  [] Burning with urination   [] Hematuria Skin:  [] Rashes   [] Ulcers   [] Wounds Psychological:  [] History of anxiety   []  History of major depression.     Physical Examination  Vitals:   03/23/18 1335  BP: 109/67  Pulse: 66  Resp: 15  Weight: 73 lb 9.6 oz (33.4 kg)  Height: 5\' 1"  (1.549 m)   Body mass index is 13.91 kg/m. Gen:  Elderly and frail, NAD Head: [place]/AT, No temporalis wasting. Ear/Nose/Throat: Hearing grossly intact, nares w/o erythema or drainage, trachea midline Eyes: Conjunctiva clear. Sclera non-icteric Neck: Supple.  Bilateral carotid bruit  Pulmonary:  Good air movement, equal and clear to auscultation bilaterally.  Cardiac: RRR, No JVD Vascular:  Vessel Right Left  Radial Palpable Palpable                                    Musculoskeletal: M/S 5/5 throughout.  No deformity or atrophy. No edema. Neurologic: CN 2-12 intact. Sensation grossly intact in extremities.  Symmetrical.  Speech is fluent. Motor exam as listed above. Psychiatric: Judgment intact, Mood & affect appropriate for pt's clinical situation. Dermatologic: No rashes or ulcers noted.  No cellulitis or open wounds.      CBC Lab Results  Component Value Date   WBC 4.4 05/02/2017   HGB 11.7 (L)  05/02/2017   HCT 34.9 (L) 05/02/2017   MCV 88.3 05/02/2017   PLT 165 05/02/2017    BMET    Component Value Date/Time   NA 139 05/02/2017 0305   NA 139 05/23/2013 1851   K 3.9 05/02/2017 0305   K 4.7 05/23/2013 1851   CL 107 05/02/2017 0305   CL 106 05/23/2013 1851   CO2 25 05/02/2017 0305   CO2 30 05/23/2013 1851   GLUCOSE 195 (H) 05/02/2017 0305   GLUCOSE 246 (H) 05/23/2013 1851   BUN 19 05/02/2017 0305  BUN 22 (H) 05/23/2013 1851   CREATININE 0.67 05/02/2017 0305   CREATININE 0.88 05/23/2013 1851   CALCIUM 8.7 (L) 05/02/2017 0305   CALCIUM 9.3 05/23/2013 1851   GFRNONAA >60 05/02/2017 0305   GFRNONAA 60 (L) 05/23/2013 1851   GFRAA >60 05/02/2017 0305   GFRAA >60 05/23/2013 1851   CrCl cannot be calculated (Patient's most recent lab result is older than the maximum 21 days allowed.).  COAG Lab Results  Component Value Date   INR 1.1 02/16/2013    Radiology No results found.    Assessment/Plan Hypertension blood pressure control important in reducing the progression of atherosclerotic disease. On appropriate oral medications.   TIA (transient ischemic attack) We will go ahead and reassess carotid disease.  She is clearly a very high risk patient but if this is symptomatic consideration for intervention may have to be given  Diabetes mellitus without complication ([place]) blood glucose control important in reducing the progression of atherosclerotic disease. Also, involved in wound healing. Diet controlled  Carotid artery stenosis Her carotid duplex reveals stable, 60 to 79% right ICA stenosis and a patent left carotid endarterectomy. Continue current medical regimen including aspirin, Plavix, and Zocor.  No role for intervention at this level particularly in a 84 year old.  She is hesitant to consider surgery at any level of disease.  I have offered her a 53-month follow-up but she would prefer an annual visit.    Leotis Pain, MD  03/23/2018 3:24  PM    This note was created with Dragon medical transcription system.  Any errors from dictation are purely unintentional

## 2018-03-23 NOTE — Assessment & Plan Note (Signed)
Her carotid duplex reveals stable, 60 to 79% right ICA stenosis and a patent left carotid endarterectomy. Continue current medical regimen including aspirin, Plavix, and Zocor.  No role for intervention at this level particularly in a 84 year old.  She is hesitant to consider surgery at any level of disease.  I have offered her a 25-month follow-up but she would prefer an annual visit.

## 2018-05-07 ENCOUNTER — Emergency Department: Payer: Medicare Other

## 2018-05-07 ENCOUNTER — Inpatient Hospital Stay (HOSPITAL_COMMUNITY)
Admit: 2018-05-07 | Discharge: 2018-05-07 | Disposition: A | Discharge status: Home / Self Care | Payer: Medicare Other | Attending: Nurse Practitioner | Admitting: Nurse Practitioner

## 2018-05-07 ENCOUNTER — Other Ambulatory Visit: Payer: Self-pay

## 2018-05-07 ENCOUNTER — Inpatient Hospital Stay
Admission: EM | Admit: 2018-05-07 | Discharge: 2018-05-09 | DRG: 066 | Disposition: A | Discharge status: Home Health | Payer: Medicare Other | Attending: Internal Medicine | Admitting: Internal Medicine

## 2018-05-07 DIAGNOSIS — I6523 Occlusion and stenosis of bilateral carotid arteries: Secondary | ICD-10-CM | POA: Diagnosis present

## 2018-05-07 DIAGNOSIS — R29810 Facial weakness: Secondary | ICD-10-CM | POA: Diagnosis present

## 2018-05-07 DIAGNOSIS — E1151 Type 2 diabetes mellitus with diabetic peripheral angiopathy without gangrene: Secondary | ICD-10-CM | POA: Diagnosis present

## 2018-05-07 DIAGNOSIS — I252 Old myocardial infarction: Secondary | ICD-10-CM | POA: Diagnosis not present

## 2018-05-07 DIAGNOSIS — I639 Cerebral infarction, unspecified: Secondary | ICD-10-CM | POA: Diagnosis not present

## 2018-05-07 DIAGNOSIS — K219 Gastro-esophageal reflux disease without esophagitis: Secondary | ICD-10-CM | POA: Diagnosis present

## 2018-05-07 DIAGNOSIS — R4701 Aphasia: Secondary | ICD-10-CM | POA: Diagnosis present

## 2018-05-07 DIAGNOSIS — I739 Peripheral vascular disease, unspecified: Secondary | ICD-10-CM

## 2018-05-07 DIAGNOSIS — R4781 Slurred speech: Secondary | ICD-10-CM | POA: Diagnosis present

## 2018-05-07 DIAGNOSIS — E785 Hyperlipidemia, unspecified: Secondary | ICD-10-CM | POA: Diagnosis present

## 2018-05-07 DIAGNOSIS — I2510 Atherosclerotic heart disease of native coronary artery without angina pectoris: Secondary | ICD-10-CM | POA: Diagnosis present

## 2018-05-07 DIAGNOSIS — Z951 Presence of aortocoronary bypass graft: Secondary | ICD-10-CM | POA: Diagnosis not present

## 2018-05-07 DIAGNOSIS — Z7982 Long term (current) use of aspirin: Secondary | ICD-10-CM

## 2018-05-07 DIAGNOSIS — Z8542 Personal history of malignant neoplasm of other parts of uterus: Secondary | ICD-10-CM | POA: Diagnosis not present

## 2018-05-07 DIAGNOSIS — Z832 Family history of diseases of the blood and blood-forming organs and certain disorders involving the immune mechanism: Secondary | ICD-10-CM | POA: Diagnosis not present

## 2018-05-07 DIAGNOSIS — Z801 Family history of malignant neoplasm of trachea, bronchus and lung: Secondary | ICD-10-CM

## 2018-05-07 DIAGNOSIS — E119 Type 2 diabetes mellitus without complications: Secondary | ICD-10-CM

## 2018-05-07 DIAGNOSIS — Z880 Allergy status to penicillin: Secondary | ICD-10-CM | POA: Diagnosis not present

## 2018-05-07 DIAGNOSIS — I708 Atherosclerosis of other arteries: Secondary | ICD-10-CM | POA: Diagnosis present

## 2018-05-07 DIAGNOSIS — M810 Age-related osteoporosis without current pathological fracture: Secondary | ICD-10-CM | POA: Diagnosis present

## 2018-05-07 DIAGNOSIS — I10 Essential (primary) hypertension: Secondary | ICD-10-CM | POA: Diagnosis present

## 2018-05-07 DIAGNOSIS — R29707 NIHSS score 7: Secondary | ICD-10-CM | POA: Diagnosis present

## 2018-05-07 DIAGNOSIS — Z87891 Personal history of nicotine dependence: Secondary | ICD-10-CM | POA: Diagnosis not present

## 2018-05-07 DIAGNOSIS — I4891 Unspecified atrial fibrillation: Secondary | ICD-10-CM | POA: Diagnosis present

## 2018-05-07 DIAGNOSIS — Z8249 Family history of ischemic heart disease and other diseases of the circulatory system: Secondary | ICD-10-CM | POA: Diagnosis not present

## 2018-05-07 DIAGNOSIS — Z7902 Long term (current) use of antithrombotics/antiplatelets: Secondary | ICD-10-CM | POA: Diagnosis not present

## 2018-05-07 LAB — COMPREHENSIVE METABOLIC PANEL
ALK PHOS: 50 U/L (ref 38–126)
ALT: 15 U/L (ref 0–44)
AST: 23 U/L (ref 15–41)
Albumin: 4.1 g/dL (ref 3.5–5.0)
Anion gap: 8 (ref 5–15)
BILIRUBIN TOTAL: 0.7 mg/dL (ref 0.3–1.2)
BUN: 19 mg/dL (ref 8–23)
CALCIUM: 9.6 mg/dL (ref 8.9–10.3)
CHLORIDE: 106 mmol/L (ref 98–111)
CO2: 29 mmol/L (ref 22–32)
Creatinine, Ser: 1 mg/dL (ref 0.44–1.00)
GFR calc Af Amer: 56 mL/min — ABNORMAL LOW (ref >60)
GFR, EST NON AFRICAN AMERICAN: 48 mL/min — AB (ref >60)
Glucose, Bld: 128 mg/dL — ABNORMAL HIGH (ref 70–99)
Potassium: 5 mmol/L (ref 3.5–5.1)
Sodium: 143 mmol/L (ref 135–145)
Total Protein: 6.7 g/dL (ref 6.5–8.1)

## 2018-05-07 LAB — DIFFERENTIAL
Abs Immature Granulocytes: 0 10*3/uL (ref 0.00–0.07)
Basophils Absolute: 0 10*3/uL (ref 0.0–0.1)
Basophils Relative: 1 %
Eosinophils Absolute: 0.1 10*3/uL (ref 0.0–0.5)
Eosinophils Relative: 1 %
Immature Granulocytes: 0 %
LYMPHS ABS: 0.8 10*3/uL (ref 0.7–4.0)
LYMPHS PCT: 18 %
MONO ABS: 0.4 10*3/uL (ref 0.1–1.0)
MONOS PCT: 8 %
NEUTROS ABS: 3.3 10*3/uL (ref 1.7–7.7)
Neutrophils Relative %: 72 %

## 2018-05-07 LAB — CBC
HCT: 40.4 % (ref 36.0–46.0)
HEMOGLOBIN: 12.9 g/dL (ref 12.0–15.0)
MCH: 29.1 pg (ref 26.0–34.0)
MCHC: 31.9 g/dL (ref 30.0–36.0)
MCV: 91.2 fL (ref 80.0–100.0)
Platelets: 170 10*3/uL (ref 150–400)
RBC: 4.43 MIL/uL (ref 3.87–5.11)
RDW: 13 % (ref 11.5–15.5)
WBC: 4.6 10*3/uL (ref 4.0–10.5)
nRBC: 0 % (ref 0.0–0.2)

## 2018-05-07 LAB — GLUCOSE, CAPILLARY: Glucose-Capillary: 100 mg/dL — ABNORMAL HIGH (ref 70–99)

## 2018-05-07 LAB — PROTIME-INR
INR: 1.03
Prothrombin Time: 13.4 seconds (ref 11.4–15.2)

## 2018-05-07 LAB — TROPONIN I

## 2018-05-07 LAB — APTT: aPTT: 32 seconds (ref 24–36)

## 2018-05-07 MED ORDER — ACETAMINOPHEN 325 MG PO TABS: 650.0000 mg | ORAL_TABLET | ORAL | Status: DC | PRN

## 2018-05-07 MED ORDER — ASPIRIN 81 MG PO CHEW
324.0000 mg | CHEWABLE_TABLET | Freq: Once | ORAL | Status: AC
  Administered 2018-05-07: 324 mg via ORAL
  Filled 2018-05-07: qty 4

## 2018-05-07 MED ORDER — ENOXAPARIN SODIUM 30 MG/0.3ML ~~LOC~~ SOLN
30.0000 mg | SUBCUTANEOUS | Status: DC
  Administered 2018-05-07 – 2018-05-08 (×2): 30 mg via SUBCUTANEOUS
  Filled 2018-05-07 (×2): qty 0.3

## 2018-05-07 MED ORDER — CLOPIDOGREL BISULFATE 75 MG PO TABS
75.0000 mg | ORAL_TABLET | Freq: Every day | ORAL | Status: DC
  Administered 2018-05-08: 22:00:00 75 mg via ORAL
  Filled 2018-05-07 (×2): qty 1

## 2018-05-07 MED ORDER — GABAPENTIN 300 MG PO CAPS
300.0000 mg | ORAL_CAPSULE | Freq: Every day | ORAL | Status: DC
  Filled 2018-05-07: qty 1

## 2018-05-07 MED ORDER — ACETAMINOPHEN 650 MG RE SUPP: 650.0000 mg | RECTAL | Status: DC | PRN

## 2018-05-07 MED ORDER — SODIUM CHLORIDE 0.9 % IV SOLN
INTRAVENOUS | Status: DC
  Administered 2018-05-07 – 2018-05-08 (×2): via INTRAVENOUS

## 2018-05-07 MED ORDER — ASPIRIN EC 81 MG PO TBEC
81.0000 mg | DELAYED_RELEASE_TABLET | Freq: Every day | ORAL | Status: DC
  Administered 2018-05-08 – 2018-05-09 (×2): 81 mg via ORAL
  Filled 2018-05-07 (×2): qty 1

## 2018-05-07 MED ORDER — SIMVASTATIN 20 MG PO TABS
40.0000 mg | ORAL_TABLET | Freq: Every day | ORAL | Status: DC
  Filled 2018-05-07: qty 2

## 2018-05-07 MED ORDER — IOHEXOL 350 MG/ML SOLN
75.0000 mL | Freq: Once | INTRAVENOUS | Status: AC | PRN
  Administered 2018-05-07: 60 mL via INTRAVENOUS

## 2018-05-07 MED ORDER — STROKE: EARLY STAGES OF RECOVERY BOOK
Freq: Once | Status: AC
  Administered 2018-05-07: 18:00:00

## 2018-05-07 MED ORDER — HYDRALAZINE HCL 20 MG/ML IJ SOLN: 10.0000 mg | Freq: Four times a day (QID) | INTRAMUSCULAR | Status: DC | PRN

## 2018-05-07 MED ORDER — ACETAMINOPHEN 160 MG/5ML PO SOLN
650.0000 mg | ORAL | Status: DC | PRN
  Filled 2018-05-07: qty 20.3

## 2018-05-07 NOTE — Progress Notes (Signed)
Anticoagulation monitoring(Lovenox):  84yo  female ordered Lovenox 40 mg Q24h  Filed Weights   05/07/18 1116  Weight: 73 lb 10.1 oz (33.4 kg)   BMI 13.9   Lab Results  Component Value Date   CREATININE 1.00 05/07/2018   CREATININE 0.67 05/02/2017   CREATININE 0.75 04/03/2017   Estimated Creatinine Clearance: 19.7 mL/min (by C-G formula based on SCr of 1 mg/dL). Hemoglobin & Hematocrit     Component Value Date/Time   HGB 12.9 05/07/2018 1038   HGB 12.4 05/23/2013 1851   HCT 40.4 05/07/2018 1038   HCT 38.7 05/23/2013 1851     Per Protocol for Patient with estCrcl < 30 ml/min and BMI < 40, will transition to Lovenox 30 mg Q24h.

## 2018-05-07 NOTE — H&P (Signed)
[facility] NAME: Yolanda Turner    MR#:  [id number]  [place] OF BIRTH:  06/28/1925  DATE OF ADMISSION:  05/07/2018  PRIMARY CARE PHYSICIAN: Albina Billet, MD   REQUESTING/REFERRING PHYSICIAN: dr Burlene Arnt  CHIEF COMPLAINT:   Right facial droop and can not talk HISTORY OF PRESENT ILLNESS:  Yolanda Turner  is a 84 y.o. female with a known history of diabetes and CAD on aspirin and Plavix who presents today due to right facial droop and a aphasia.  Patient has had a history in the past.  Apparently patient woke up with the symptoms.  When she went to bed she had no symptoms.  She has been evaluated by neurology.  She underwent CT the head as well as CTA of the head and neck.  Neurology is recommending admission to the hospital for further work-up including an MRI of the brain and echocardiogram.  Patient continues to have a aphasia and facial droop.  PAST MEDICAL HISTORY:   Past Medical History:  Diagnosis Date  . Carotid artery stenosis   . Carotid artery stenosis   . Collagen vascular disease ([place])   . Coronary artery disease   . Diabetes mellitus without complication (HCC)    diet control  . GERD (gastroesophageal reflux disease)   . Hypertension   . Myocardial infarction ([place])   . Osteoporosis   . Peripheral vascular disease ([place])   . Pneumonia   . Renal artery stenosis ([place])   . Stroke ([place])   . Uterine cancer ([place])     PAST SURGICAL HISTORY:   Past Surgical History:  Procedure Laterality Date  . ABDOMINAL HYSTERECTOMY    . CAROTID ENDARTERECTOMY    . CATARACT EXTRACTION W/PHACO Right 09/28/2015   Procedure: CATARACT EXTRACTION PHACO AND INTRAOCULAR LENS PLACEMENT (IOC);  Surgeon: Estill Cotta, MD;  Location: [facility];  Service: Ophthalmology;  Laterality: Right;  [place] 041:18 AP% 28.6 CDE 114.94 fluid pack lot # [id number]  . CORONARY ARTERY BYPASS GRAFT    . PERIPHERAL VASCULAR CATHETERIZATION Right 01/22/2015    Procedure: Carotid PTA/Stent Intervention;  Surgeon: Katha Cabal, MD;  Location: [facility];  Service: Cardiovascular;  Laterality: Right;  . uterine cancer surgery      SOCIAL HISTORY:   Social History   Tobacco Use  . Smoking status: Former Research scientist (life sciences)  . Smokeless tobacco: Never Used  Substance Use Topics  . Alcohol use: No    FAMILY HISTORY:   Family History  Problem Relation Age of Onset  . Lung cancer Daughter     DRUG ALLERGIES:   Allergies  Allergen Reactions  . Penicillins Rash    Has patient had a PCN reaction causing immediate rash, facial/tongue/throat swelling, SOB or lightheadedness with hypotension: No Has patient had a PCN reaction causing severe rash involving mucus membranes or skin necrosis: No Has patient had a PCN reaction that required hospitalization: No Has patient had a PCN reaction occurring within the last 10 years: No If all of the above answers are "NO", then may proceed with Cephalosporin use.    REVIEW OF SYSTEMS:   Review of Systems  Constitutional: Negative.  Negative for chills, fever and malaise/fatigue.  HENT: Negative.  Negative for ear discharge, ear pain, hearing loss, nosebleeds and sore throat.   Eyes: Negative.  Negative for blurred vision and pain.  Respiratory: Negative.  Negative for cough, hemoptysis, shortness of breath and wheezing.   Cardiovascular: Negative.  Negative  for chest pain, palpitations and leg swelling.  Gastrointestinal: Negative.  Negative for abdominal pain, blood in stool, diarrhea, nausea and vomiting.  Genitourinary: Negative.  Negative for dysuria.  Musculoskeletal: Negative.  Negative for back pain.  Skin: Negative.   Neurological: Positive for sensory change and speech change. Negative for dizziness, tremors, focal weakness, seizures and headaches.  Endo/Heme/Allergies: Negative.  Does not bruise/bleed easily.  Psychiatric/Behavioral: Negative.  Negative for depression, hallucinations and  suicidal ideas.    MEDICATIONS AT HOME:   Prior to Admission medications   Medication Sig Start Date End Date Taking? Authorizing Provider  aspirin EC 81 MG tablet Take 81 mg by mouth daily.   Yes [provider]  clopidogrel (PLAVIX) 75 MG tablet Take 75 mg by mouth at bedtime.   Yes [provider]  gabapentin (NEURONTIN) 300 MG capsule Take 300 mg by mouth at bedtime.   Yes [provider]  lisinopril (PRINIVIL,ZESTRIL) 10 MG tablet Take 10 mg by mouth at bedtime.    Yes [provider]  metoCLOPramide (REGLAN) 10 MG tablet Take 1 tablet (10 mg total) every 8 (eight) hours as needed by mouth. Patient taking differently: Take 10 mg by mouth every 8 (eight) hours as needed for nausea or vomiting.  05/02/17  Yes Loney Hering, MD  metoprolol succinate (TOPROL-XL) 50 MG 24 hr tablet Take 25 mg by mouth at bedtime.    Yes [provider]  simvastatin (ZOCOR) 40 MG tablet Take 40 mg by mouth at bedtime.    Yes [provider]      VITAL SIGNS:  Blood pressure (!) 167/60, pulse 80, resp. rate 11, weight 33.4 kg, SpO2 95 %.  PHYSICAL EXAMINATION:   Physical Exam  Constitutional: She is oriented to person, place, and time. No distress.  Thin frail  HENT:  Head: Normocephalic.  Eyes: No scleral icterus.  Neck: Normal range of motion. Neck supple. No JVD present. No tracheal deviation present.  Cardiovascular: Normal rate, regular rhythm and normal heart sounds. Exam reveals no gallop and no friction rub.  No murmur heard. Pulmonary/Chest: Effort normal and breath sounds normal. No respiratory distress. She has no wheezes. She has no rales. She exhibits no tenderness.  Abdominal: Soft. Bowel sounds are normal. She exhibits no distension and no mass. There is no tenderness. There is no rebound and no guarding.  Musculoskeletal: Normal range of motion. She exhibits no edema.  Neurological: She is alert and oriented to person, place,  and time. A cranial nerve deficit (facial droop aphasia) and sensory deficit is present.  Skin: Skin is warm. No rash noted. No erythema.  Psychiatric: Her behavior is normal. Judgment normal.      LABORATORY PANEL:   CBC Recent Labs  Lab 05/07/18 1038  WBC 4.6  HGB 12.9  HCT 40.4  PLT 170   ------------------------------------------------------------------------------------------------------------------  Chemistries  Recent Labs  Lab 05/07/18 1038  NA 143  K 5.0  CL 106  CO2 29  GLUCOSE 128*  BUN 19  CREATININE 1.00  CALCIUM 9.6  AST 23  ALT 15  ALKPHOS 50  BILITOT 0.7   ------------------------------------------------------------------------------------------------------------------  Cardiac Enzymes Recent Labs  Lab 05/07/18 1038  TROPONINI <0.03   ------------------------------------------------------------------------------------------------------------------  RADIOLOGY:  Ct Angio Head W Or Wo Contrast  Result Date: 05/07/2018 CLINICAL DATA:  84 year old female code stroke with right facial droop and aphasia. EXAM: CT ANGIOGRAPHY HEAD AND NECK TECHNIQUE: Multidetector CT imaging of the head and neck was performed using the standard  protocol during bolus administration of intravenous contrast. Multiplanar CT image reconstructions and MIPs were obtained to evaluate the vascular anatomy. Carotid stenosis measurements (when applicable) are obtained utilizing NASCET criteria, using the distal internal carotid diameter as the denominator. CONTRAST:  79mL OMNIPAQUE IOHEXOL 350 MG/ML SOLN COMPARISON:  Head CT without contrast 1033 hours today. FINDINGS: CTA NECK Skeleton: Nearly absent dentition. Degenerative changes throughout the cervical spine. Osteopenia. Prior sternotomy. No acute osseous abnormality identified. Upper chest: Confluent apical lung scarring with mild bronchiectasis. No superior mediastinal lymphadenopathy. Other neck: Negative. Aortic arch: Calcified  aortic atherosclerosis. 3 vessel arch configuration. Right carotid system: No brachiocephalic or right CCA origin stenosis despite plaque. Bulky calcified plaque at the right ICA origin and bulb resulting in high-grade short-segment stenosis at the distal bulb approaching a radiographic string sign (series 4, image 65 and series 8, image 47. The right ICA remains patent to the skull base with mild tortuosity but no additional stenosis. Left carotid system: No left CCA stenosis despite plaque. Little plaque at the left carotid bifurcation and no cervical left ICA stenosis. Vertebral arteries: Vertebral no proximal right subclavian artery origin artery or stenosis despite right plaque. Patent and mildly dolichoectatic right vertebral artery to the skull base. Bulky proximal left subclavian artery plaque with stenosis estimated at 60 % with respect to the distal vessel. Soft and calcified plaque at the left vertebral artery origin with mild to moderate stenosis. No additional left vertebral artery stenosis to the skull base. CTA HEAD Posterior circulation: Right V4 segment calcified plaque but no distal vertebral stenosis. The right vertebral is mildly dominant. Normal PICA origins. Patent vertebrobasilar junction and basilar artery without stenosis. Mild ectatic basilar tip. Normal SCA and PCA origins. Posterior communicating arteries are diminutive or absent. Normal bilateral PCA branches. Anterior circulation: Both ICA siphons are patent. Moderate to severe calcified siphon plaque bilaterally with mild to moderate left and mild right supraclinoid ICA stenosis. Normal ophthalmic artery origins. Patent carotid termini. Normal MCA and ACA origins. Anterior communicating artery and bilateral ACA branches are within normal limits. Right MCA M1 segment, right MCA bifurcation, and right MCA branches are within normal limits. The left MCA M1 segment and bifurcation are patent without stenosis. No left MCA M2 branch  occlusion is identified, but there is a left M3 branch occlusion in the middle division best seen on series 11, image 23. The other left MCA branches appear within normal limits. Venous sinuses: Patent. Anatomic variants: Mildly dominant right vertebral artery. Delayed phase: There are some cytotoxic edema changes in the left operculum suspected on series 12, image 14 but these are subtle due to being superimposed on chronic posterior left MCA encephalomalacia. No abnormal enhancement identified. Review of the MIP images confirms the above findings IMPRESSION: 1. Negative for LVO but positive for Left MCA M3 branch occlusion in the middle division. This was discussed by telephone with Dr. Charlotte Crumb on 05/07/2018 at 1145 hours. 2. Positive also for atherosclerotic plaque with significant stenoses: - Right ICA origin high-grade stenosis at the distal bulb approaching a RADIOGRAPHIC STRING SIGN. - Moderate Left supraclinoid ICA stenosis. - Left Subclavian Artery origin 60% stenosis. 3. Mild cytotoxic edema suspected at the left operculum. No hemorrhage or mass effect. 4.  Aortic Atherosclerosis (ICD10-I70.0). Electronically Signed   By: Genevie Ann M.D.   On: 05/07/2018 11:52   Ct Angio Neck W And/or Wo Contrast  Result Date: 05/07/2018 CLINICAL DATA:  84 year old female code stroke with right facial droop and aphasia. EXAM:  CT ANGIOGRAPHY HEAD AND NECK TECHNIQUE: Multidetector CT imaging of the head and neck was performed using the standard protocol during bolus administration of intravenous contrast. Multiplanar CT image reconstructions and MIPs were obtained to evaluate the vascular anatomy. Carotid stenosis measurements (when applicable) are obtained utilizing NASCET criteria, using the distal internal carotid diameter as the denominator. CONTRAST:  74mL OMNIPAQUE IOHEXOL 350 MG/ML SOLN COMPARISON:  Head CT without contrast 1033 hours today. FINDINGS: CTA NECK Skeleton: Nearly absent dentition. Degenerative  changes throughout the cervical spine. Osteopenia. Prior sternotomy. No acute osseous abnormality identified. Upper chest: Confluent apical lung scarring with mild bronchiectasis. No superior mediastinal lymphadenopathy. Other neck: Negative. Aortic arch: Calcified aortic atherosclerosis. 3 vessel arch configuration. Right carotid system: No brachiocephalic or right CCA origin stenosis despite plaque. Bulky calcified plaque at the right ICA origin and bulb resulting in high-grade short-segment stenosis at the distal bulb approaching a radiographic string sign (series 4, image 65 and series 8, image 47. The right ICA remains patent to the skull base with mild tortuosity but no additional stenosis. Left carotid system: No left CCA stenosis despite plaque. Little plaque at the left carotid bifurcation and no cervical left ICA stenosis. Vertebral arteries: Vertebral no proximal right subclavian artery origin artery or stenosis despite right plaque. Patent and mildly dolichoectatic right vertebral artery to the skull base. Bulky proximal left subclavian artery plaque with stenosis estimated at 60 % with respect to the distal vessel. Soft and calcified plaque at the left vertebral artery origin with mild to moderate stenosis. No additional left vertebral artery stenosis to the skull base. CTA HEAD Posterior circulation: Right V4 segment calcified plaque but no distal vertebral stenosis. The right vertebral is mildly dominant. Normal PICA origins. Patent vertebrobasilar junction and basilar artery without stenosis. Mild ectatic basilar tip. Normal SCA and PCA origins. Posterior communicating arteries are diminutive or absent. Normal bilateral PCA branches. Anterior circulation: Both ICA siphons are patent. Moderate to severe calcified siphon plaque bilaterally with mild to moderate left and mild right supraclinoid ICA stenosis. Normal ophthalmic artery origins. Patent carotid termini. Normal MCA and ACA origins. Anterior  communicating artery and bilateral ACA branches are within normal limits. Right MCA M1 segment, right MCA bifurcation, and right MCA branches are within normal limits. The left MCA M1 segment and bifurcation are patent without stenosis. No left MCA M2 branch occlusion is identified, but there is a left M3 branch occlusion in the middle division best seen on series 11, image 23. The other left MCA branches appear within normal limits. Venous sinuses: Patent. Anatomic variants: Mildly dominant right vertebral artery. Delayed phase: There are some cytotoxic edema changes in the left operculum suspected on series 12, image 14 but these are subtle due to being superimposed on chronic posterior left MCA encephalomalacia. No abnormal enhancement identified. Review of the MIP images confirms the above findings IMPRESSION: 1. Negative for LVO but positive for Left MCA M3 branch occlusion in the middle division. This was discussed by telephone with Dr. Charlotte Crumb on 05/07/2018 at 1145 hours. 2. Positive also for atherosclerotic plaque with significant stenoses: - Right ICA origin high-grade stenosis at the distal bulb approaching a RADIOGRAPHIC STRING SIGN. - Moderate Left supraclinoid ICA stenosis. - Left Subclavian Artery origin 60% stenosis. 3. Mild cytotoxic edema suspected at the left operculum. No hemorrhage or mass effect. 4.  Aortic Atherosclerosis (ICD10-I70.0). Electronically Signed   By: Genevie Ann M.D.   On: 05/07/2018 11:52   Ct Head Code Stroke Wo Contrast  Result Date: 05/07/2018 CLINICAL DATA:  Code stroke. Focal neuro deficit. Right facial droop and aphasia EXAM: CT HEAD WITHOUT CONTRAST TECHNIQUE: Contiguous axial images were obtained from the base of the skull through the vertex without intravenous contrast. COMPARISON:  CT head 05/02/2017 FINDINGS: Brain: Moderate atrophy. Chronic microvascular ischemic change throughout the white matter. Chronic infarct right inferior frontal lobe unchanged. Chronic  infarct in the left parietal operculum, left parietal lobe and left occipital lobe unchanged. Chronic lacunar infarction lateral basal ganglia on the left unchanged. Small chronic infarct left cerebellum. Negative for acute infarct, hemorrhage, or mass.  No midline shift. Vascular: Atherosclerotic calcification. Negative for hyperdense vessel Skull: Negative Sinuses/Orbits: Mucosal edema left posterior ethmoid sinus. Negative orbit Other: None ASPECTS ([place] Stroke Program Early CT Score) - Ganglionic level infarction (caudate, lentiform nuclei, internal capsule, insula, M1-M3 cortex): 7 - Supraganglionic infarction (M4-M6 cortex): 3 Total score (0-10 with 10 being normal): 10 IMPRESSION: 1. No acute intracranial abnormality 2. Extensive chronic ischemic changes as above 3. ASPECTS is 10 4. These results were called by telephone at the time of interpretation on 05/07/2018 at 10:43 am to Dr. Burlene Arnt , who verbally acknowledged these results. Electronically Signed   By: Franchot Gallo M.D.   On: 05/07/2018 10:44    EKG:  pending  IMPRESSION AND PLAN:   84 year old female with history of CAD and previous stroke who presents today with a aphasia and facial droop.  1.  Acute stroke: Neurology has recommended MRI and echocardiogram which has been ordered. PT, OT and speech consultation requested Continue neurochecks Continue aspirin and Plavix Continue statin Check lipid panel and A1c Consultation with vascular surgery due to above CTA results placed via EPIC  2.  CAD: Continue Plavix, lisinopril, metoprolol, Zocor and aspirin  3.  Diet-controlled diabetes: Patient does not want a sliding scale so I will not order this.  All the records are reviewed and case discussed with ED provider. Management plans discussed with the patient and she is in agreement  CODE STATUS: Full  TOTAL TIME TAKING CARE OF THIS PATIENT: 35 minutes.    Jenetta Wease M.D on 05/07/2018 at 12:06 PM  Between 7am to 6pm  - Pager - [phone]  After 6pm go to [email or web address] - password [email or web address] [facility]  Office  [phone]  CC: Primary care physician; Albina Billet, MD

## 2018-05-07 NOTE — Progress Notes (Signed)
*  PRELIMINARY RESULTS* Echocardiogram 2D Echocardiogram has been performed.  Yolanda Turner 05/07/2018, 2:42 PM

## 2018-05-07 NOTE — ED Triage Notes (Signed)
Arrives with right facial droop and aphasia.  Patient has history of CVA in the past.  Daughter states patient awoke with symptoms.  Unable to determine last known well at this time.

## 2018-05-07 NOTE — Consult Note (Signed)
[facility] Vascular Consult Note  MRN : [id number]  Yolanda Turner is a 84 y.o. (11-05-1925) female who presents with chief complaint of  Chief Complaint  Patient presents with  . Code Stroke   History of Present Illness:  The patient is a 84 year old female with a past medical history of past CVA, confusion, TIA, hypertension, diabetes type 2, carotid artery stenosis, June vascular disease, coronary artery disease, GERD, myocardial infarction, osteoporosis, peripheral vascular disease, renal artery stenosis who presented to the [facility] emergency department with right facial droop and aphasia.  The patient is well-known to our service and we have seen her as an outpatient.  Patient was evaluated by neurology who recommended a CTA. CTA showed a left MCA M3 branch occlusion in the middle division unfortunately the patient was outside the window for TPA given chronicity of complaint.  The patient was admitted for further work-up and care.  The CTA of the head and neck that was completed on May 07, 2018 was also notable for Right ICA origin high-grade stenosis at the distal bulb approaching a RADIOGRAPHIC STRING SIGN. Moderate Left supraclinoid ICA stenosis. Left Subclavian Artery origin 60% stenosis.  The patient still displays a right facial droop and some aphasia.  Her daughter was at the bedside. Her daughter provided most of the history for this consult.  Patient was asymptomatic and was in her usual state of health however woke up early this morning unable to speak.  At this time, the patient denies any type of pain or shortness of breath.  Denies any claudication-like symptoms, rest pain or ulcer formation to the bilateral lower extremity patient denies any upper or lower extremity weakness.  Vascular surgery was consulted by Dr. Benjie Karvonen for further recommendations.  Current Facility-Administered Medications  Medication Dose Route  Frequency Provider Last Rate Last Dose  .  stroke: mapping our early stages of recovery book   Does not apply Once Bettey Costa, MD      . 0.9 %  sodium chloride infusion   Intravenous Continuous Mody, Sital, MD      . acetaminophen (TYLENOL) tablet 650 mg  650 mg Oral Q4H PRN Bettey Costa, MD       Or  . acetaminophen (TYLENOL) solution 650 mg  650 mg Per Tube Q4H PRN Bettey Costa, MD       Or  . acetaminophen (TYLENOL) suppository 650 mg  650 mg Rectal Q4H PRN Benjie Karvonen, Sital, MD      . aspirin chewable tablet 324 mg  324 mg Oral Once Schuyler Amor, MD      . aspirin EC tablet 81 mg  81 mg Oral Daily Mody, Sital, MD      . clopidogrel (PLAVIX) tablet 75 mg  75 mg Oral QHS Mody, Sital, MD      . enoxaparin (LOVENOX) injection 30 mg  30 mg Subcutaneous Q24H Mody, Sital, MD      . gabapentin (NEURONTIN) capsule 300 mg  300 mg Oral QHS Mody, Sital, MD      . hydrALAZINE (APRESOLINE) injection 10 mg  10 mg Intravenous Q6H PRN Mody, Sital, MD      . simvastatin (ZOCOR) tablet 40 mg  40 mg Oral QHS Bettey Costa, MD       Past Medical History:  Diagnosis Date  . Carotid artery stenosis   . Carotid artery stenosis   . Collagen vascular disease ([place])   . Coronary artery disease   .  Diabetes mellitus without complication (HCC)    diet control  . GERD (gastroesophageal reflux disease)   . Hypertension   . Myocardial infarction ([place])   . Osteoporosis   . Peripheral vascular disease ([place])   . Pneumonia   . Renal artery stenosis ([place])   . Stroke ([place])   . Uterine cancer [facility])    Past Surgical History:  Procedure Laterality Date  . ABDOMINAL HYSTERECTOMY    . CAROTID ENDARTERECTOMY    . CATARACT EXTRACTION W/PHACO Right 09/28/2015   Procedure: CATARACT EXTRACTION PHACO AND INTRAOCULAR LENS PLACEMENT (IOC);  Surgeon: Estill Cotta, MD;  Location: [facility] ORS;  Service: Ophthalmology;  Laterality: Right;  [place] 041:18 AP% 28.6 CDE 114.94 fluid pack lot # [id number]  . CORONARY ARTERY BYPASS GRAFT     . PERIPHERAL VASCULAR CATHETERIZATION Right 01/22/2015   Procedure: Carotid PTA/Stent Intervention;  Surgeon: Katha Cabal, MD;  Location: [facility];  Service: Cardiovascular;  Laterality: Right;  . uterine cancer surgery     Social History Social History   Tobacco Use  . Smoking status: Former Research scientist (life sciences)  . Smokeless tobacco: Never Used  Substance Use Topics  . Alcohol use: No  . Drug use: No   Family History Family History  Problem Relation Age of Onset  . Lung cancer Daughter   Has family history of peripheral artery disease, venous disease or bleeding/clotting disorder  Allergies  Allergen Reactions  . Penicillins Rash    Has patient had a PCN reaction causing immediate rash, facial/tongue/throat swelling, SOB or lightheadedness with hypotension: No Has patient had a PCN reaction causing severe rash involving mucus membranes or skin necrosis: No Has patient had a PCN reaction that required hospitalization: No Has patient had a PCN reaction occurring within the last 10 years: No If all of the above answers are "NO", then may proceed with Cephalosporin use.   REVIEW OF SYSTEMS (Negative unless checked)  Constitutional: [] Weight loss  [] Fever  [] Chills Cardiac: [] Chest pain   [] Chest pressure   [] Palpitations   [] Shortness of breath when laying flat   [] Shortness of breath at rest   [] Shortness of breath with exertion. Vascular:  [] Pain in legs with walking   [] Pain in legs at rest   [] Pain in legs when laying flat   [] Claudication   [] Pain in feet when walking  [] Pain in feet at rest  [] Pain in feet when laying flat   [] History of DVT   [] Phlebitis   [] Swelling in legs   [] Varicose veins   [] Non-healing ulcers Pulmonary:   [] Uses home oxygen   [] Productive cough   [] Hemoptysis   [] Wheeze  [] COPD   [] Asthma Neurologic:  [] Dizziness  [] Blackouts   [] Seizures   [x] History of stroke   [] History of TIA  [x] Aphasia   [] Temporary blindness   [] Dysphagia   [] Weakness or  numbness in arms   [] Weakness or numbness in legs Musculoskeletal:  [] Arthritis   [] Joint swelling   [] Joint pain   [] Low back pain Hematologic:  [] Easy bruising  [] Easy bleeding   [] Hypercoagulable state   [] Anemic  [] Hepatitis Gastrointestinal:  [] Blood in stool   [] Vomiting blood  [] Gastroesophageal reflux/heartburn   [] Difficulty swallowing. Genitourinary:  [] Chronic kidney disease   [] Difficult urination  [] Frequent urination  [] Burning with urination   [] Blood in urine Skin:  [] Rashes   [] Ulcers   [] Wounds Psychological:  [] History of anxiety   []  History of major depression.  Physical Examination  Vitals:   05/07/18 1100 05/07/18 1116  05/07/18 1200 05/07/18 1253  BP: (!) 167/60  (!) 142/60 (!) 157/58  Pulse: 80  71 65  Resp: 11  13 18   Temp:    97.8 F (36.6 C)  TempSrc:    Oral  SpO2: 95%  100% 100%  Weight:  33.4 kg  32.9 kg  Height:    5' (1.524 m)   Body mass index is 14.17 kg/m. Gen:  WD/WN, NAD. Patient with aphasia.  Head: Right facial droop. No temporalis wasting. Prominent temp pulse not noted. Ear/Nose/Throat: Hearing grossly intact, nares w/o erythema or drainage, oropharynx w/o Erythema/Exudate Eyes: Sclera non-icteric, conjunctiva clear Neck: Trachea midline.  No JVD. Bilateral carotid bruits noted. Pulmonary:  Good air movement, respirations not labored, equal bilaterally.  Cardiac: RRR, normal S1, S2. Vascular:  Vessel Right Left  Radial Palpable Palpable  Ulnar Palpable Palpable  Brachial Palpable Palpable  Carotid Palpable, without bruit Palpable, without bruit  Aorta Not palpable N/A  Femoral Palpable Palpable  Popliteal Palpable Palpable  PT Palpable Palpable  DP Palpable Palpable   Gastrointestinal: soft, non-tender/non-distended. No guarding/reflex.  Musculoskeletal: M/S 5/5 throughout. No deformity or atrophy.  Neurologic: Sensation grossly intact in extremities.  Symmetrical. Motor exam as listed above. Psychiatric: Judgment intact, Mood &  affect appropriate for pt's clinical situation. Dermatologic: No rashes or ulcers noted.  No cellulitis or open wounds. Lymph : No Cervical, Axillary, or Inguinal lymphadenopathy.  CBC Lab Results  Component Value Date   WBC 4.6 05/07/2018   HGB 12.9 05/07/2018   HCT 40.4 05/07/2018   MCV 91.2 05/07/2018   PLT 170 05/07/2018   BMET    Component Value Date/Time   NA 143 05/07/2018 1038   NA 139 05/23/2013 1851   K 5.0 05/07/2018 1038   K 4.7 05/23/2013 1851   CL 106 05/07/2018 1038   CL 106 05/23/2013 1851   CO2 29 05/07/2018 1038   CO2 30 05/23/2013 1851   GLUCOSE 128 (H) 05/07/2018 1038   GLUCOSE 246 (H) 05/23/2013 1851   BUN 19 05/07/2018 1038   BUN 22 (H) 05/23/2013 1851   CREATININE 1.00 05/07/2018 1038   CREATININE 0.88 05/23/2013 1851   CALCIUM 9.6 05/07/2018 1038   CALCIUM 9.3 05/23/2013 1851   GFRNONAA 48 (L) 05/07/2018 1038   GFRNONAA 60 (L) 05/23/2013 1851   GFRAA 56 (L) 05/07/2018 1038   GFRAA >60 05/23/2013 1851   Estimated Creatinine Clearance: 19.4 mL/min (by C-G formula based on SCr of 1 mg/dL).  COAG Lab Results  Component Value Date   INR 1.03 05/07/2018   INR 1.1 02/16/2013   Radiology Ct Angio Head W Or Wo Contrast  Result Date: 05/07/2018 CLINICAL DATA:  84 year old female code stroke with right facial droop and aphasia. EXAM: CT ANGIOGRAPHY HEAD AND NECK TECHNIQUE: Multidetector CT imaging of the head and neck was performed using the standard protocol during bolus administration of intravenous contrast. Multiplanar CT image reconstructions and MIPs were obtained to evaluate the vascular anatomy. Carotid stenosis measurements (when applicable) are obtained utilizing NASCET criteria, using the distal internal carotid diameter as the denominator. CONTRAST:  47mL OMNIPAQUE IOHEXOL 350 MG/ML SOLN COMPARISON:  Head CT without contrast 1033 hours today. FINDINGS: CTA NECK Skeleton: Nearly absent dentition. Degenerative changes throughout the cervical  spine. Osteopenia. Prior sternotomy. No acute osseous abnormality identified. Upper chest: Confluent apical lung scarring with mild bronchiectasis. No superior mediastinal lymphadenopathy. Other neck: Negative. Aortic arch: Calcified aortic atherosclerosis. 3 vessel arch configuration. Right carotid system: No brachiocephalic or right  CCA origin stenosis despite plaque. Bulky calcified plaque at the right ICA origin and bulb resulting in high-grade short-segment stenosis at the distal bulb approaching a radiographic string sign (series 4, image 65 and series 8, image 47. The right ICA remains patent to the skull base with mild tortuosity but no additional stenosis. Left carotid system: No left CCA stenosis despite plaque. Little plaque at the left carotid bifurcation and no cervical left ICA stenosis. Vertebral arteries: Vertebral no proximal right subclavian artery origin artery or stenosis despite right plaque. Patent and mildly dolichoectatic right vertebral artery to the skull base. Bulky proximal left subclavian artery plaque with stenosis estimated at 60 % with respect to the distal vessel. Soft and calcified plaque at the left vertebral artery origin with mild to moderate stenosis. No additional left vertebral artery stenosis to the skull base. CTA HEAD Posterior circulation: Right V4 segment calcified plaque but no distal vertebral stenosis. The right vertebral is mildly dominant. Normal PICA origins. Patent vertebrobasilar junction and basilar artery without stenosis. Mild ectatic basilar tip. Normal SCA and PCA origins. Posterior communicating arteries are diminutive or absent. Normal bilateral PCA branches. Anterior circulation: Both ICA siphons are patent. Moderate to severe calcified siphon plaque bilaterally with mild to moderate left and mild right supraclinoid ICA stenosis. Normal ophthalmic artery origins. Patent carotid termini. Normal MCA and ACA origins. Anterior communicating artery and  bilateral ACA branches are within normal limits. Right MCA M1 segment, right MCA bifurcation, and right MCA branches are within normal limits. The left MCA M1 segment and bifurcation are patent without stenosis. No left MCA M2 branch occlusion is identified, but there is a left M3 branch occlusion in the middle division best seen on series 11, image 23. The other left MCA branches appear within normal limits. Venous sinuses: Patent. Anatomic variants: Mildly dominant right vertebral artery. Delayed phase: There are some cytotoxic edema changes in the left operculum suspected on series 12, image 14 but these are subtle due to being superimposed on chronic posterior left MCA encephalomalacia. No abnormal enhancement identified. Review of the MIP images confirms the above findings IMPRESSION: 1. Negative for LVO but positive for Left MCA M3 branch occlusion in the middle division. This was discussed by telephone with Dr. Charlotte Crumb on 05/07/2018 at 1145 hours. 2. Positive also for atherosclerotic plaque with significant stenoses: - Right ICA origin high-grade stenosis at the distal bulb approaching a RADIOGRAPHIC STRING SIGN. - Moderate Left supraclinoid ICA stenosis. - Left Subclavian Artery origin 60% stenosis. 3. Mild cytotoxic edema suspected at the left operculum. No hemorrhage or mass effect. 4.  Aortic Atherosclerosis (ICD10-I70.0). Electronically Signed   By: Genevie Ann M.D.   On: 05/07/2018 11:52   Ct Angio Neck W And/or Wo Contrast  Result Date: 05/07/2018 CLINICAL DATA:  84 year old female code stroke with right facial droop and aphasia. EXAM: CT ANGIOGRAPHY HEAD AND NECK TECHNIQUE: Multidetector CT imaging of the head and neck was performed using the standard protocol during bolus administration of intravenous contrast. Multiplanar CT image reconstructions and MIPs were obtained to evaluate the vascular anatomy. Carotid stenosis measurements (when applicable) are obtained utilizing NASCET criteria,  using the distal internal carotid diameter as the denominator. CONTRAST:  35mL OMNIPAQUE IOHEXOL 350 MG/ML SOLN COMPARISON:  Head CT without contrast 1033 hours today. FINDINGS: CTA NECK Skeleton: Nearly absent dentition. Degenerative changes throughout the cervical spine. Osteopenia. Prior sternotomy. No acute osseous abnormality identified. Upper chest: Confluent apical lung scarring with mild bronchiectasis. No superior mediastinal lymphadenopathy.  Other neck: Negative. Aortic arch: Calcified aortic atherosclerosis. 3 vessel arch configuration. Right carotid system: No brachiocephalic or right CCA origin stenosis despite plaque. Bulky calcified plaque at the right ICA origin and bulb resulting in high-grade short-segment stenosis at the distal bulb approaching a radiographic string sign (series 4, image 65 and series 8, image 47. The right ICA remains patent to the skull base with mild tortuosity but no additional stenosis. Left carotid system: No left CCA stenosis despite plaque. Little plaque at the left carotid bifurcation and no cervical left ICA stenosis. Vertebral arteries: Vertebral no proximal right subclavian artery origin artery or stenosis despite right plaque. Patent and mildly dolichoectatic right vertebral artery to the skull base. Bulky proximal left subclavian artery plaque with stenosis estimated at 60 % with respect to the distal vessel. Soft and calcified plaque at the left vertebral artery origin with mild to moderate stenosis. No additional left vertebral artery stenosis to the skull base. CTA HEAD Posterior circulation: Right V4 segment calcified plaque but no distal vertebral stenosis. The right vertebral is mildly dominant. Normal PICA origins. Patent vertebrobasilar junction and basilar artery without stenosis. Mild ectatic basilar tip. Normal SCA and PCA origins. Posterior communicating arteries are diminutive or absent. Normal bilateral PCA branches. Anterior circulation: Both ICA  siphons are patent. Moderate to severe calcified siphon plaque bilaterally with mild to moderate left and mild right supraclinoid ICA stenosis. Normal ophthalmic artery origins. Patent carotid termini. Normal MCA and ACA origins. Anterior communicating artery and bilateral ACA branches are within normal limits. Right MCA M1 segment, right MCA bifurcation, and right MCA branches are within normal limits. The left MCA M1 segment and bifurcation are patent without stenosis. No left MCA M2 branch occlusion is identified, but there is a left M3 branch occlusion in the middle division best seen on series 11, image 23. The other left MCA branches appear within normal limits. Venous sinuses: Patent. Anatomic variants: Mildly dominant right vertebral artery. Delayed phase: There are some cytotoxic edema changes in the left operculum suspected on series 12, image 14 but these are subtle due to being superimposed on chronic posterior left MCA encephalomalacia. No abnormal enhancement identified. Review of the MIP images confirms the above findings IMPRESSION: 1. Negative for LVO but positive for Left MCA M3 branch occlusion in the middle division. This was discussed by telephone with Dr. Charlotte Crumb on 05/07/2018 at 1145 hours. 2. Positive also for atherosclerotic plaque with significant stenoses: - Right ICA origin high-grade stenosis at the distal bulb approaching a RADIOGRAPHIC STRING SIGN. - Moderate Left supraclinoid ICA stenosis. - Left Subclavian Artery origin 60% stenosis. 3. Mild cytotoxic edema suspected at the left operculum. No hemorrhage or mass effect. 4.  Aortic Atherosclerosis (ICD10-I70.0). Electronically Signed   By: Genevie Ann M.D.   On: 05/07/2018 11:52   Ct Head Code Stroke Wo Contrast  Result Date: 05/07/2018 CLINICAL DATA:  Code stroke. Focal neuro deficit. Right facial droop and aphasia EXAM: CT HEAD WITHOUT CONTRAST TECHNIQUE: Contiguous axial images were obtained from the base of the skull through  the vertex without intravenous contrast. COMPARISON:  CT head 05/02/2017 FINDINGS: Brain: Moderate atrophy. Chronic microvascular ischemic change throughout the white matter. Chronic infarct right inferior frontal lobe unchanged. Chronic infarct in the left parietal operculum, left parietal lobe and left occipital lobe unchanged. Chronic lacunar infarction lateral basal ganglia on the left unchanged. Small chronic infarct left cerebellum. Negative for acute infarct, hemorrhage, or mass.  No midline shift. Vascular: Atherosclerotic calcification. Negative  for hyperdense vessel Skull: Negative Sinuses/Orbits: Mucosal edema left posterior ethmoid sinus. Negative orbit Other: None ASPECTS ([place] Stroke Program Early CT Score) - Ganglionic level infarction (caudate, lentiform nuclei, internal capsule, insula, M1-M3 cortex): 7 - Supraganglionic infarction (M4-M6 cortex): 3 Total score (0-10 with 10 being normal): 10 IMPRESSION: 1. No acute intracranial abnormality 2. Extensive chronic ischemic changes as above 3. ASPECTS is 10 4. These results were called by telephone at the time of interpretation on 05/07/2018 at 10:43 am to Dr. Burlene Arnt , who verbally acknowledged these results. Electronically Signed   By: Franchot Gallo M.D.   On: 05/07/2018 10:44   Assessment/Plan The patient is a 84 year old female known to our practice with a past medical history of past CVA, confusion, TIA, hypertension, diabetes type 2, carotid artery stenosis, collagen vascular disease, coronary artery disease, GERD, myocardial infarction, osteoporosis, peripheral vascular disease, renal artery stenosis who presented to the [facility] emergency with acute CVA 1. Carotid Stenosis: Patient with known right carotid stenosis.  The patient is status post a left carotid endarterectomy.  Patient's left sided stroke most likely due to neurological vessel disease versus emboli not from her known right carotid stenosis.  Have  discussed moving forward with an endarterectomy with the patient in the past.  In the past, the patient has refused surgery just wanting yearly follow-up.  The patient has a type III bovine arch highly calcified and would not be a candidate for a carotid stent placement.  Patient and her daughter understand this.  We would recommend a left carotid endarterectomy in approximately 2 to 3 weeks due to her recent stroke. At this time, the patient and her daughter do not know if they would like to move forward.  Continue antiplatelet therapy for medical management.  If the patient would like to move forward with surgery we would be happy to facilitate this. 2. PAD: Patient with multiple risk factors however she is asymptomatic at this time.  The patient is on aspirin and a statin for medical management.  We will continue to follow this as an outpatient 3. Diabetes: On appropriate medications. Encouraged good control as its slows the progression of atherosclerotic disease  Discussed with Dr. Mayme Genta, PA-C  05/07/2018 4:45 PM  This note was created with Dragon medical transcription system.  Any error is purely unintentional.

## 2018-05-07 NOTE — Progress Notes (Signed)
SLP Cancellation Note  Patient Details Name: Yolanda Turner MRN: [id number] DOB: 03-Sep-1925   Cancelled treatment:       Reason Eval/Treat Not Completed: Patient at procedure or test/unavailable(chart reviewed; consulted NSG. Pt is out of room.). ST services will f/u tomorrow.     Orinda Kenner, [place], CCC-SLP Deane Wattenbarger 05/07/2018, 3:57 PM

## 2018-05-07 NOTE — ED Notes (Signed)
CODE STROKE CALLED TO [id number] 

## 2018-05-07 NOTE — Progress Notes (Signed)
Chaplain responded to a code stroke. Pt was alert and cognizant, Chaplain prayed with Pt before team start care. Chaplain escorted daughter back and held space for daughter. Chaplain practiced active listening and maintained presence with daughter. Chaplain will follow up   05/07/18 1100  Clinical Encounter Type  Visited With Patient and family together  Visit Type Code  Referral From Care management  Spiritual Encounters  Spiritual Needs Prayer

## 2018-05-07 NOTE — Consult Note (Signed)
Referring Physician: Bettey Costa    Chief Complaint: Aphasia and right facial droop  HPI: Yolanda Turner is an 84 y.o. female medical history of coronary artery disease status post CEA, left MCA stroke, CAD status post bypass graft with LIMA to LAD, SVG to PDA and anterior MI, PVD, hyperlipidemia, atrial fibrillation on anticoagulation, diabetes mellitus, uterine cancer, hypertension presenting to the ED today with chief complaints of aphasia and right facial droop.  The patient's daughter who is currently at the bedside, patient lives with family and is the caregiver of her younger son who has medical issues.  His daughter states she was her normal self last night when she went to bed 8 PM but woke up this morning with word finding difficulty and right facial droop.  Denies associated altered sensorium, cranial nerve deficit, seizures, focal motor or sensory deficits, diplopia, or vomiting, ipsilateral or contralateral paralysis/weakness, numbness or tingling.  Patient has difficulty with words at baseline as residual effects of last stroke.  Initial NIH stroke scale 7.  CT head did not show acute intracranial abnormality.  CTA head and neck to for large vessel occlusion but positive for left MCA M3 branch occlusion in the middle division, high-grade stenosis noted in the right ICA, left supraclinoid ICA stenosis, left subclavian artery stenosis of greater than 60%.  Patient was therefore admitted for further stroke work-up and management.  Date last known well: Date: 05/06/2018 Time last known well: Time: 20:00 tPA Given: No: Outside of window.  Past Medical History:  Diagnosis Date  . Carotid artery stenosis   . Carotid artery stenosis   . Collagen vascular disease ([place])   . Coronary artery disease   . Diabetes mellitus without complication (HCC)    diet control  . GERD (gastroesophageal reflux disease)   . Hypertension   . Myocardial infarction ([place])   . Osteoporosis   . Peripheral vascular  disease ([place])   . Pneumonia   . Renal artery stenosis ([place])   . Stroke ([place])   . Uterine cancer [facility])     Past Surgical History:  Procedure Laterality Date  . ABDOMINAL HYSTERECTOMY    . CAROTID ENDARTERECTOMY    . CATARACT EXTRACTION W/PHACO Right 09/28/2015   Procedure: CATARACT EXTRACTION PHACO AND INTRAOCULAR LENS PLACEMENT (IOC);  Surgeon: Estill Cotta, MD;  Location: [facility] ORS;  Service: Ophthalmology;  Laterality: Right;  [place] 041:18 AP% 28.6 CDE 114.94 fluid pack lot # [id number]  . CORONARY ARTERY BYPASS GRAFT    . PERIPHERAL VASCULAR CATHETERIZATION Right 01/22/2015   Procedure: Carotid PTA/Stent Intervention;  Surgeon: Katha Cabal, MD;  Location: [place] [facility];  Service: Cardiovascular;  Laterality: Right;  . uterine cancer surgery      Family History  Problem Relation Age of Onset  . Lung cancer Daughter    Social History:  reports that she has quit smoking. She has never used smokeless tobacco. She reports that she does not drink alcohol or use drugs.  Allergies:  Allergies  Allergen Reactions  . Penicillins Rash    Has patient had a PCN reaction causing immediate rash, facial/tongue/throat swelling, SOB or lightheadedness with hypotension: No Has patient had a PCN reaction causing severe rash involving mucus membranes or skin necrosis: No Has patient had a PCN reaction that required hospitalization: No Has patient had a PCN reaction occurring within the last 10 years: No If all of the above answers are "NO", then may proceed with Cephalosporin use.    Medications:  I  have reviewed the patient's current medications. Prior to Admission:  (Not in a hospital admission) Prior to Admission medications   Medication Sig Start Date End Date Taking? Authorizing Provider  aspirin EC 81 MG tablet Take 81 mg by mouth daily.   Yes [provider]  clopidogrel (PLAVIX) 75 MG tablet Take 75 mg by mouth at bedtime.   Yes [provider]   gabapentin (NEURONTIN) 300 MG capsule Take 300 mg by mouth at bedtime.   Yes [provider]  lisinopril (PRINIVIL,ZESTRIL) 10 MG tablet Take 10 mg by mouth at bedtime.    Yes [provider]  metoCLOPramide (REGLAN) 10 MG tablet Take 1 tablet (10 mg total) every 8 (eight) hours as needed by mouth. Patient taking differently: Take 10 mg by mouth every 8 (eight) hours as needed for nausea or vomiting.  05/02/17  Yes Loney Hering, MD  metoprolol succinate (TOPROL-XL) 50 MG 24 hr tablet Take 25 mg by mouth at bedtime.    Yes [provider]  simvastatin (ZOCOR) 40 MG tablet Take 40 mg by mouth at bedtime.    Yes [provider]    Scheduled: . aspirin  324 mg Oral Once    ROS: Unable to obtain from patient due to severe aphasia  Physical Examination: Blood pressure (!) 142/60, pulse 71, resp. rate 13, weight 33.4 kg, SpO2 100 %.   HEENT-  Normocephalic, no lesions, without obvious abnormality.  Normal external eye and conjunctiva.  Normal TM's bilaterally.  Normal auditory canals and external ears. Normal external nose, mucus membranes and septum.  Normal pharynx. Cardiovascular- S1, S2 normal, pulses palpable throughout   Lungs- chest clear, no wheezing, rales, normal symmetric air entry Abdomen- soft, non-tender; bowel sounds normal; no masses,  no organomegaly Extremities- no edema Lymph-no adenopathy palpable Musculoskeletal-no joint tenderness, deformity or swelling Skin-warm and dry, no hyperpigmentation, vitiligo, or suspicious lesions  Neurological Exam   Mental Status: Alert, oriented, thought content appropriate.  Severe aphasia.  Able to follow 3 step commands without difficulty. Attention span and concentration seemed appropriate  Cranial Nerves: II: Discs flat bilaterally; Visual fields grossly normal, pupils equal, round, reactive to light and accommodation III,IV, VI: ptosis not present, extra-ocular motions intact  bilaterally V,VII: right facial droop, facial light touch sensation intact VIII: hearing normal bilaterally IX,X: gag reflex present XI: bilateral shoulder shrug XII: midline tongue extension Motor: Right :  Upper extremity   5/5 Without pronator drift      Left: Upper extremity   5/5 without pronator drift Right:   Lower extremity   5/5                                          Left: Lower extremity   5/5 Tone and bulk:normal tone throughout; no atrophy noted Sensory: Pinprick and light touch intact bilaterally Deep Tendon Reflexes: 2+ and symmetric throughout Plantars: Right: mute                              Left: mute Cerebellar: Finger-to-nose testing intact bilaterally. Heel to shin testing normal bilaterally Gait: not tested due to safety concerns  Data Reviewed  Laboratory Studies:  Basic Metabolic Panel: Recent Labs  Lab 05/07/18 1038  NA 143  K 5.0  CL 106  CO2 29  GLUCOSE 128*  BUN 19  CREATININE 1.00  CALCIUM 9.6    Liver Function Tests: Recent Labs  Lab 05/07/18 1038  AST 23  ALT 15  ALKPHOS 50  BILITOT 0.7  PROT 6.7  ALBUMIN 4.1   No results for input(s): LIPASE, AMYLASE in the last 168 hours. No results for input(s): AMMONIA in the last 168 hours.  CBC: Recent Labs  Lab 05/07/18 1038  WBC 4.6  NEUTROABS 3.3  HGB 12.9  HCT 40.4  MCV 91.2  PLT 170    Cardiac Enzymes: Recent Labs  Lab 05/07/18 1038  TROPONINI <0.03    BNP: Invalid input(s): POCBNP  CBG: Recent Labs  Lab 05/07/18 1028  GLUCAP 100*    Microbiology: No results found for this or any previous visit.  Coagulation Studies: Recent Labs    05/07/18 1038  LABPROT 13.4  INR 1.03    Urinalysis: No results for input(s): COLORURINE, LABSPEC, PHURINE, GLUCOSEU, HGBUR, BILIRUBINUR, KETONESUR, PROTEINUR, UROBILINOGEN, NITRITE, LEUKOCYTESUR in the last 168 hours.  Invalid input(s): APPERANCEUR  Lipid Panel:    Component Value Date/Time   CHOL 123 04/02/2017 1424    TRIG 108 04/02/2017 1424   HDL 60 04/02/2017 1424   CHOLHDL 2.1 04/02/2017 1424   VLDL 22 04/02/2017 1424   LDLCALC 41 04/02/2017 1424    HgbA1C:  Lab Results  Component Value Date   HGBA1C 6.2 (H) 04/02/2017    Urine Drug Screen:  No results found for: LABOPIA, COCAINSCRNUR, LABBENZ, AMPHETMU, THCU, LABBARB  Alcohol Level: No results for input(s): ETH in the last 168 hours.  Other results: EKG: normal EKG, normal sinus rhythm, unchanged from previous tracings.  Imaging: Ct Angio Head W Or Wo Contrast  Result Date: 05/07/2018 CLINICAL DATA:  83 year old female code stroke with right facial droop and aphasia. EXAM: CT ANGIOGRAPHY HEAD AND NECK TECHNIQUE: Multidetector CT imaging of the head and neck was performed using the standard protocol during bolus administration of intravenous contrast. Multiplanar CT image reconstructions and MIPs were obtained to evaluate the vascular anatomy. Carotid stenosis measurements (when applicable) are obtained utilizing NASCET criteria, using the distal internal carotid diameter as the denominator. CONTRAST:  19mL OMNIPAQUE IOHEXOL 350 MG/ML SOLN COMPARISON:  Head CT without contrast 1033 hours today. FINDINGS: CTA NECK Skeleton: Nearly absent dentition. Degenerative changes throughout the cervical spine. Osteopenia. Prior sternotomy. No acute osseous abnormality identified. Upper chest: Confluent apical lung scarring with mild bronchiectasis. No superior mediastinal lymphadenopathy. Other neck: Negative. Aortic arch: Calcified aortic atherosclerosis. 3 vessel arch configuration. Right carotid system: No brachiocephalic or right CCA origin stenosis despite plaque. Bulky calcified plaque at the right ICA origin and bulb resulting in high-grade short-segment stenosis at the distal bulb approaching a radiographic string sign (series 4, image 65 and series 8, image 47. The right ICA remains patent to the skull base with mild tortuosity but no additional  stenosis. Left carotid system: No left CCA stenosis despite plaque. Little plaque at the left carotid bifurcation and no cervical left ICA stenosis. Vertebral arteries: Vertebral no proximal right subclavian artery origin artery or stenosis despite right plaque. Patent and mildly dolichoectatic right vertebral artery to the skull base. Bulky proximal left subclavian artery plaque with stenosis estimated at 60 % with respect to the distal vessel. Soft and calcified plaque at the left vertebral artery origin with mild to moderate stenosis. No additional left vertebral artery stenosis to the skull base. CTA HEAD Posterior circulation: Right V4 segment calcified plaque but no distal vertebral stenosis. The right vertebral is mildly dominant.  Normal PICA origins. Patent vertebrobasilar junction and basilar artery without stenosis. Mild ectatic basilar tip. Normal SCA and PCA origins. Posterior communicating arteries are diminutive or absent. Normal bilateral PCA branches. Anterior circulation: Both ICA siphons are patent. Moderate to severe calcified siphon plaque bilaterally with mild to moderate left and mild right supraclinoid ICA stenosis. Normal ophthalmic artery origins. Patent carotid termini. Normal MCA and ACA origins. Anterior communicating artery and bilateral ACA branches are within normal limits. Right MCA M1 segment, right MCA bifurcation, and right MCA branches are within normal limits. The left MCA M1 segment and bifurcation are patent without stenosis. No left MCA M2 branch occlusion is identified, but there is a left M3 branch occlusion in the middle division best seen on series 11, image 23. The other left MCA branches appear within normal limits. Venous sinuses: Patent. Anatomic variants: Mildly dominant right vertebral artery. Delayed phase: There are some cytotoxic edema changes in the left operculum suspected on series 12, image 14 but these are subtle due to being superimposed on chronic posterior  left MCA encephalomalacia. No abnormal enhancement identified. Review of the MIP images confirms the above findings IMPRESSION: 1. Negative for LVO but positive for Left MCA M3 branch occlusion in the middle division. This was discussed by telephone with Dr. Charlotte Crumb on 05/07/2018 at 1145 hours. 2. Positive also for atherosclerotic plaque with significant stenoses: - Right ICA origin high-grade stenosis at the distal bulb approaching a RADIOGRAPHIC STRING SIGN. - Moderate Left supraclinoid ICA stenosis. - Left Subclavian Artery origin 60% stenosis. 3. Mild cytotoxic edema suspected at the left operculum. No hemorrhage or mass effect. 4.  Aortic Atherosclerosis (ICD10-I70.0). Electronically Signed   By: Genevie Ann M.D.   On: 05/07/2018 11:52   Ct Angio Neck W And/or Wo Contrast  Result Date: 05/07/2018 CLINICAL DATA:  84 year old female code stroke with right facial droop and aphasia. EXAM: CT ANGIOGRAPHY HEAD AND NECK TECHNIQUE: Multidetector CT imaging of the head and neck was performed using the standard protocol during bolus administration of intravenous contrast. Multiplanar CT image reconstructions and MIPs were obtained to evaluate the vascular anatomy. Carotid stenosis measurements (when applicable) are obtained utilizing NASCET criteria, using the distal internal carotid diameter as the denominator. CONTRAST:  28mL OMNIPAQUE IOHEXOL 350 MG/ML SOLN COMPARISON:  Head CT without contrast 1033 hours today. FINDINGS: CTA NECK Skeleton: Nearly absent dentition. Degenerative changes throughout the cervical spine. Osteopenia. Prior sternotomy. No acute osseous abnormality identified. Upper chest: Confluent apical lung scarring with mild bronchiectasis. No superior mediastinal lymphadenopathy. Other neck: Negative. Aortic arch: Calcified aortic atherosclerosis. 3 vessel arch configuration. Right carotid system: No brachiocephalic or right CCA origin stenosis despite plaque. Bulky calcified plaque at the right  ICA origin and bulb resulting in high-grade short-segment stenosis at the distal bulb approaching a radiographic string sign (series 4, image 65 and series 8, image 47. The right ICA remains patent to the skull base with mild tortuosity but no additional stenosis. Left carotid system: No left CCA stenosis despite plaque. Little plaque at the left carotid bifurcation and no cervical left ICA stenosis. Vertebral arteries: Vertebral no proximal right subclavian artery origin artery or stenosis despite right plaque. Patent and mildly dolichoectatic right vertebral artery to the skull base. Bulky proximal left subclavian artery plaque with stenosis estimated at 60 % with respect to the distal vessel. Soft and calcified plaque at the left vertebral artery origin with mild to moderate stenosis. No additional left vertebral artery stenosis to the skull base. CTA  HEAD Posterior circulation: Right V4 segment calcified plaque but no distal vertebral stenosis. The right vertebral is mildly dominant. Normal PICA origins. Patent vertebrobasilar junction and basilar artery without stenosis. Mild ectatic basilar tip. Normal SCA and PCA origins. Posterior communicating arteries are diminutive or absent. Normal bilateral PCA branches. Anterior circulation: Both ICA siphons are patent. Moderate to severe calcified siphon plaque bilaterally with mild to moderate left and mild right supraclinoid ICA stenosis. Normal ophthalmic artery origins. Patent carotid termini. Normal MCA and ACA origins. Anterior communicating artery and bilateral ACA branches are within normal limits. Right MCA M1 segment, right MCA bifurcation, and right MCA branches are within normal limits. The left MCA M1 segment and bifurcation are patent without stenosis. No left MCA M2 branch occlusion is identified, but there is a left M3 branch occlusion in the middle division best seen on series 11, image 23. The other left MCA branches appear within normal limits.  Venous sinuses: Patent. Anatomic variants: Mildly dominant right vertebral artery. Delayed phase: There are some cytotoxic edema changes in the left operculum suspected on series 12, image 14 but these are subtle due to being superimposed on chronic posterior left MCA encephalomalacia. No abnormal enhancement identified. Review of the MIP images confirms the above findings IMPRESSION: 1. Negative for LVO but positive for Left MCA M3 branch occlusion in the middle division. This was discussed by telephone with Dr. Charlotte Crumb on 05/07/2018 at 1145 hours. 2. Positive also for atherosclerotic plaque with significant stenoses: - Right ICA origin high-grade stenosis at the distal bulb approaching a RADIOGRAPHIC STRING SIGN. - Moderate Left supraclinoid ICA stenosis. - Left Subclavian Artery origin 60% stenosis. 3. Mild cytotoxic edema suspected at the left operculum. No hemorrhage or mass effect. 4.  Aortic Atherosclerosis (ICD10-I70.0). Electronically Signed   By: Genevie Ann M.D.   On: 05/07/2018 11:52   Ct Head Code Stroke Wo Contrast  Result Date: 05/07/2018 CLINICAL DATA:  Code stroke. Focal neuro deficit. Right facial droop and aphasia EXAM: CT HEAD WITHOUT CONTRAST TECHNIQUE: Contiguous axial images were obtained from the base of the skull through the vertex without intravenous contrast. COMPARISON:  CT head 05/02/2017 FINDINGS: Brain: Moderate atrophy. Chronic microvascular ischemic change throughout the white matter. Chronic infarct right inferior frontal lobe unchanged. Chronic infarct in the left parietal operculum, left parietal lobe and left occipital lobe unchanged. Chronic lacunar infarction lateral basal ganglia on the left unchanged. Small chronic infarct left cerebellum. Negative for acute infarct, hemorrhage, or mass.  No midline shift. Vascular: Atherosclerotic calcification. Negative for hyperdense vessel Skull: Negative Sinuses/Orbits: Mucosal edema left posterior ethmoid sinus. Negative orbit  Other: None ASPECTS ([place] Stroke Program Early CT Score) - Ganglionic level infarction (caudate, lentiform nuclei, internal capsule, insula, M1-M3 cortex): 7 - Supraganglionic infarction (M4-M6 cortex): 3 Total score (0-10 with 10 being normal): 10 IMPRESSION: 1. No acute intracranial abnormality 2. Extensive chronic ischemic changes as above 3. ASPECTS is 10 4. These results were called by telephone at the time of interpretation on 05/07/2018 at 10:43 am to Dr. Burlene Arnt , who verbally acknowledged these results. Electronically Signed   By: Franchot Gallo M.D.   On: 05/07/2018 10:44   Assessment: 84 y.o. female medical history of coronary artery disease status post CEA, left MCA stroke, CAD status post bypass graft with LIMA to LAD, SVG to PDA and anterior MI, PVD, hyperlipidemia, atrial fibrillation on anticoagulation, diabetes mellitus, uterine cancer, hypertension presenting to the ED today with chief complaints of aphasia and right facial droop.  Concerns for ischemic event.  CT head did not show acute intracranial abnormality.  CTA head and neck to for large vessel occlusion but positive for left MCA M3 branch occlusion in the middle division, high-grade stenosis noted in the right ICA, left supraclinoid ICA stenosis, left subclavian artery stenosis of greater than 60%.  Patient states she was taken dual therapy antiplatelet aspirin 81 mg and Plavix 5 mg once a day prior to this event.  Patient has history of paroxysmal nonvalvular atrial fibrillation, not been on anticoagulation medication for stroke risk reduction due to concerns of bleeding and fall as per the family may need to reevaluate and start on anticoagulation within 5 to 10 days.  Stroke Risk Factors - atrial fibrillation, carotid stenosis, diabetes mellitus, hyperlipidemia and hypertension  Plan: 1. HgbA1c, fasting lipid panel 2. MRI of the brain without contrast 3. PT consult, OT consult, Speech consult 4. Echocardiogram 5.  Prophylactic therapy-Antiplatelet med: Aspirin - dose 81 mg and Plavix 75 mg/day.  Re-evaluate need for anticoagulation within 5 to 10 days 6. NPO until RN stroke swallow screen 7. Telemetry monitoring 8. Frequent neuro checks  This patient was staffed with Dr. Irish Elders, Alease Frame who personally evaluated patient, reviewed documentation and agreed with assessment and plan of care as above.  Rufina Falco, DNP, FNP-BC Board certified Nurse Practitioner Neurology Department   05/07/2018, 12:36 PM

## 2018-05-07 NOTE — Progress Notes (Signed)
Chaplain responded to an OR for an AD. Chaplain educated Pt and daughter regarding AD. They will get together as a family to discuss. Chaplain offered prayer and family accepted.    05/07/18 1300  Clinical Encounter Type  Visited With Patient and family together  Visit Type Follow-up  Referral From Physician  Spiritual Encounters  Spiritual Needs Brochure;Prayer

## 2018-05-07 NOTE — ED Provider Notes (Signed)
[facility] Emergency Department Provider Note  ____________________________________________   I have reviewed the triage vital signs and the nursing notes. Where available I have reviewed prior notes and, if possible and indicated, outside hospital notes.    HISTORY  Chief Complaint Code Stroke    HPI Yolanda Turner is a 84 y.o. female  Who presents today after family noticed this morning that she was having difficulty speaking and a right facial droop.  Patient has had by the family assessment "for 5" strokes.  She does have a residual baseline difficulty speaking according to family.  They states she went to bed last night in her normal state of health at around 8:00 and woke up this morning with word finding difficulties and a small right facial droop which was atypical for her.  No trauma.  Patient herself states to the extent that she can that she has no numbness or weakness or any other complaints aside from that reported above.  History is very difficult because patient does have a significant expressive aphasia.  Most of the history is per family.    Past Medical History:  Diagnosis Date  . Carotid artery stenosis   . Carotid artery stenosis   . Collagen vascular disease ([place])   . Coronary artery disease   . Diabetes mellitus without complication (HCC)    diet control  . GERD (gastroesophageal reflux disease)   . Hypertension   . Myocardial infarction ([place])   . Osteoporosis   . Peripheral vascular disease ([place])   . Pneumonia   . Renal artery stenosis ([place])   . Stroke ([place])   . Uterine cancer [facility])     Patient Active Problem List   Diagnosis Date Noted  . CVA (cerebral vascular accident) ([place]) 05/07/2018  . Hypertension 02/02/2018  . Diabetes mellitus without complication ([place]) 40/98/1191  . Carotid artery stenosis 02/02/2018  . Acute CVA (cerebrovascular accident) ([place]) 04/04/2017  . Speech problem 04/02/2017  . Confusion 04/02/2017  .  TIA (transient ischemic attack) 04/02/2017    Past Surgical History:  Procedure Laterality Date  . ABDOMINAL HYSTERECTOMY    . CAROTID ENDARTERECTOMY    . CATARACT EXTRACTION W/PHACO Right 09/28/2015   Procedure: CATARACT EXTRACTION PHACO AND INTRAOCULAR LENS PLACEMENT (IOC);  Surgeon: Estill Cotta, MD;  Location: [facility] ORS;  Service: Ophthalmology;  Laterality: Right;  [place] 041:18 AP% 28.6 CDE 114.94 fluid pack lot # [id number]  . CORONARY ARTERY BYPASS GRAFT    . PERIPHERAL VASCULAR CATHETERIZATION Right 01/22/2015   Procedure: Carotid PTA/Stent Intervention;  Surgeon: Katha Cabal, MD;  Location: [place] CV LAB;  Service: Cardiovascular;  Laterality: Right;  . uterine cancer surgery      Prior to Admission medications   Medication Sig Start Date End Date Taking? Authorizing Provider  aspirin EC 81 MG tablet Take 81 mg by mouth daily.   Yes [provider]  clopidogrel (PLAVIX) 75 MG tablet Take 75 mg by mouth at bedtime.   Yes [provider]  gabapentin (NEURONTIN) 300 MG capsule Take 300 mg by mouth at bedtime.   Yes [provider]  lisinopril (PRINIVIL,ZESTRIL) 10 MG tablet Take 10 mg by mouth at bedtime.    Yes [provider]  metoCLOPramide (REGLAN) 10 MG tablet Take 1 tablet (10 mg total) every 8 (eight) hours as needed by mouth. Patient taking differently: Take 10 mg by mouth every 8 (eight) hours as needed for nausea or vomiting.  05/02/17  Yes Charlesetta Ivory  P, MD  metoprolol succinate (TOPROL-XL) 50 MG 24 hr tablet Take 25 mg by mouth at bedtime.    Yes [provider]  simvastatin (ZOCOR) 40 MG tablet Take 40 mg by mouth at bedtime.    Yes [provider]    Allergies Penicillins  Family History  Problem Relation Age of Onset  . Lung cancer Daughter     Social History Social History   Tobacco Use  . Smoking status: Former Research scientist (life sciences)  . Smokeless tobacco: Never Used  Substance Use Topics  .  Alcohol use: No  . Drug use: No    Review of Systems Constitutional: No fever/chills Eyes: No visual changes. ENT: No sore throat. No stiff neck no neck pain Cardiovascular: Denies chest pain. Respiratory: Denies shortness of breath. Gastrointestinal:   no vomiting.  No diarrhea.  No constipation. Genitourinary: Negative for dysuria. Musculoskeletal: Negative lower extremity swelling Skin: Negative for rash. Neurological: See HPI   ____________________________________________   PHYSICAL EXAM:  VITAL SIGNS: ED Triage Vitals  Enc Vitals Group     BP 05/07/18 1100 (!) 167/60     Pulse Rate 05/07/18 1100 80     Resp 05/07/18 1100 11     Temp --      Temp src --      SpO2 05/07/18 1100 95 %     Weight 05/07/18 1116 73 lb 10.1 oz (33.4 kg)     Height --      Head Circumference --      Peak Flow --      Pain Score --      Pain Loc --      Pain Edu? --      Excl. in [place]? --     Constitutional: Alert and oriented. Well appearing and in no acute distress. Eyes: Conjunctivae are normal Head: Atraumatic HEENT: No congestion/rhinnorhea. Mucous membranes are moist.  Oropharynx non-erythematous Neck:   Nontender with no meningismus, no masses, no stridor Cardiovascular: Normal rate, regular rhythm. Grossly normal heart sounds.  Good peripheral circulation. Respiratory: Normal respiratory effort.  No retractions. Lungs CTAB. Abdominal: Soft and nontender. No distention. No guarding no rebound Back:  There is no focal tenderness or step off.  there is no midline tenderness there are no lesions noted. there is no CVA tenderness Musculoskeletal: No lower extremity tenderness, no upper extremity tenderness. No joint effusions, no DVT signs strong distal pulses no edema Neurologic: No skin aphasia noted, patient can say her name and the name of her daughter, however, it is garbled, and a stroke scale according to this is probably a 5 is difficult to test orientation given her speech  limitations.  However she does tell me it is 2019 to the extent that I can determine.  Aside from the significant speech deficit, patient does have a slight right facial droop.  It is very subtle.  Unclear to me if this is significantly different from her baseline although family states that it is.  Otherwise cranial nerves II through XII are grossly intact 5 out of 5 strength bilateral upper lower extremity finger-to-nose reassuring, sensation intact, no obvious cerebellar signs noted Skin:  Skin is warm, dry and intact. No rash noted. Psychiatric: Mood and affect are normal. Speech and behavior are normal.  ____________________________________________   LABS (all labs ordered are listed, but only abnormal results are displayed)  Labs Reviewed  COMPREHENSIVE METABOLIC PANEL - Abnormal; Notable for the following components:      Result Value  Glucose, Bld 128 (*)    GFR calc non Af Amer 48 (*)    GFR calc Af Amer 56 (*)    All other components within normal limits  GLUCOSE, CAPILLARY - Abnormal; Notable for the following components:   Glucose-Capillary 100 (*)    All other components within normal limits  PROTIME-INR  APTT  CBC  DIFFERENTIAL  TROPONIN I  CBG MONITORING, ED    Pertinent labs  results that were available during my care of the patient were reviewed by me and considered in my medical decision making (see chart for details). ____________________________________________  EKG  I personally interpreted any EKGs ordered by me or triage Sinus rhythm rate 85 bpm old anterior infarct noted, ST elevation and ST changes laterally and anteriorly consistent with left bundle branch block, I do not detect any significant change from prior. ____________________________________________  RADIOLOGY  Pertinent labs & imaging results that were available during my care of the patient were reviewed by me and considered in my medical decision making (see chart for details). If possible,  patient and/or family made aware of any abnormal findings.  Ct Angio Head W Or Wo Contrast  Result Date: 05/07/2018 CLINICAL DATA:  84 year old female code stroke with right facial droop and aphasia. EXAM: CT ANGIOGRAPHY HEAD AND NECK TECHNIQUE: Multidetector CT imaging of the head and neck was performed using the standard protocol during bolus administration of intravenous contrast. Multiplanar CT image reconstructions and MIPs were obtained to evaluate the vascular anatomy. Carotid stenosis measurements (when applicable) are obtained utilizing NASCET criteria, using the distal internal carotid diameter as the denominator. CONTRAST:  4mL OMNIPAQUE IOHEXOL 350 MG/ML SOLN COMPARISON:  Head CT without contrast 1033 hours today. FINDINGS: CTA NECK Skeleton: Nearly absent dentition. Degenerative changes throughout the cervical spine. Osteopenia. Prior sternotomy. No acute osseous abnormality identified. Upper chest: Confluent apical lung scarring with mild bronchiectasis. No superior mediastinal lymphadenopathy. Other neck: Negative. Aortic arch: Calcified aortic atherosclerosis. 3 vessel arch configuration. Right carotid system: No brachiocephalic or right CCA origin stenosis despite plaque. Bulky calcified plaque at the right ICA origin and bulb resulting in high-grade short-segment stenosis at the distal bulb approaching a radiographic string sign (series 4, image 65 and series 8, image 47. The right ICA remains patent to the skull base with mild tortuosity but no additional stenosis. Left carotid system: No left CCA stenosis despite plaque. Little plaque at the left carotid bifurcation and no cervical left ICA stenosis. Vertebral arteries: Vertebral no proximal right subclavian artery origin artery or stenosis despite right plaque. Patent and mildly dolichoectatic right vertebral artery to the skull base. Bulky proximal left subclavian artery plaque with stenosis estimated at 60 % with respect to the distal  vessel. Soft and calcified plaque at the left vertebral artery origin with mild to moderate stenosis. No additional left vertebral artery stenosis to the skull base. CTA HEAD Posterior circulation: Right V4 segment calcified plaque but no distal vertebral stenosis. The right vertebral is mildly dominant. Normal PICA origins. Patent vertebrobasilar junction and basilar artery without stenosis. Mild ectatic basilar tip. Normal SCA and PCA origins. Posterior communicating arteries are diminutive or absent. Normal bilateral PCA branches. Anterior circulation: Both ICA siphons are patent. Moderate to severe calcified siphon plaque bilaterally with mild to moderate left and mild right supraclinoid ICA stenosis. Normal ophthalmic artery origins. Patent carotid termini. Normal MCA and ACA origins. Anterior communicating artery and bilateral ACA branches are within normal limits. Right MCA M1 segment, right MCA bifurcation, and  right MCA branches are within normal limits. The left MCA M1 segment and bifurcation are patent without stenosis. No left MCA M2 branch occlusion is identified, but there is a left M3 branch occlusion in the middle division best seen on series 11, image 23. The other left MCA branches appear within normal limits. Venous sinuses: Patent. Anatomic variants: Mildly dominant right vertebral artery. Delayed phase: There are some cytotoxic edema changes in the left operculum suspected on series 12, image 14 but these are subtle due to being superimposed on chronic posterior left MCA encephalomalacia. No abnormal enhancement identified. Review of the MIP images confirms the above findings IMPRESSION: 1. Negative for LVO but positive for Left MCA M3 branch occlusion in the middle division. This was discussed by telephone with Dr. Charlotte Crumb on 05/07/2018 at 1145 hours. 2. Positive also for atherosclerotic plaque with significant stenoses: - Right ICA origin high-grade stenosis at the distal bulb  approaching a RADIOGRAPHIC STRING SIGN. - Moderate Left supraclinoid ICA stenosis. - Left Subclavian Artery origin 60% stenosis. 3. Mild cytotoxic edema suspected at the left operculum. No hemorrhage or mass effect. 4.  Aortic Atherosclerosis (ICD10-I70.0). Electronically Signed   By: Genevie Ann M.D.   On: 05/07/2018 11:52   Ct Angio Neck W And/or Wo Contrast  Result Date: 05/07/2018 CLINICAL DATA:  84 year old female code stroke with right facial droop and aphasia. EXAM: CT ANGIOGRAPHY HEAD AND NECK TECHNIQUE: Multidetector CT imaging of the head and neck was performed using the standard protocol during bolus administration of intravenous contrast. Multiplanar CT image reconstructions and MIPs were obtained to evaluate the vascular anatomy. Carotid stenosis measurements (when applicable) are obtained utilizing NASCET criteria, using the distal internal carotid diameter as the denominator. CONTRAST:  75mL OMNIPAQUE IOHEXOL 350 MG/ML SOLN COMPARISON:  Head CT without contrast 1033 hours today. FINDINGS: CTA NECK Skeleton: Nearly absent dentition. Degenerative changes throughout the cervical spine. Osteopenia. Prior sternotomy. No acute osseous abnormality identified. Upper chest: Confluent apical lung scarring with mild bronchiectasis. No superior mediastinal lymphadenopathy. Other neck: Negative. Aortic arch: Calcified aortic atherosclerosis. 3 vessel arch configuration. Right carotid system: No brachiocephalic or right CCA origin stenosis despite plaque. Bulky calcified plaque at the right ICA origin and bulb resulting in high-grade short-segment stenosis at the distal bulb approaching a radiographic string sign (series 4, image 65 and series 8, image 47. The right ICA remains patent to the skull base with mild tortuosity but no additional stenosis. Left carotid system: No left CCA stenosis despite plaque. Little plaque at the left carotid bifurcation and no cervical left ICA stenosis. Vertebral arteries:  Vertebral no proximal right subclavian artery origin artery or stenosis despite right plaque. Patent and mildly dolichoectatic right vertebral artery to the skull base. Bulky proximal left subclavian artery plaque with stenosis estimated at 60 % with respect to the distal vessel. Soft and calcified plaque at the left vertebral artery origin with mild to moderate stenosis. No additional left vertebral artery stenosis to the skull base. CTA HEAD Posterior circulation: Right V4 segment calcified plaque but no distal vertebral stenosis. The right vertebral is mildly dominant. Normal PICA origins. Patent vertebrobasilar junction and basilar artery without stenosis. Mild ectatic basilar tip. Normal SCA and PCA origins. Posterior communicating arteries are diminutive or absent. Normal bilateral PCA branches. Anterior circulation: Both ICA siphons are patent. Moderate to severe calcified siphon plaque bilaterally with mild to moderate left and mild right supraclinoid ICA stenosis. Normal ophthalmic artery origins. Patent carotid termini. Normal MCA and ACA origins.  Anterior communicating artery and bilateral ACA branches are within normal limits. Right MCA M1 segment, right MCA bifurcation, and right MCA branches are within normal limits. The left MCA M1 segment and bifurcation are patent without stenosis. No left MCA M2 branch occlusion is identified, but there is a left M3 branch occlusion in the middle division best seen on series 11, image 23. The other left MCA branches appear within normal limits. Venous sinuses: Patent. Anatomic variants: Mildly dominant right vertebral artery. Delayed phase: There are some cytotoxic edema changes in the left operculum suspected on series 12, image 14 but these are subtle due to being superimposed on chronic posterior left MCA encephalomalacia. No abnormal enhancement identified. Review of the MIP images confirms the above findings IMPRESSION: 1. Negative for LVO but positive for  Left MCA M3 branch occlusion in the middle division. This was discussed by telephone with Dr. Charlotte Crumb on 05/07/2018 at 1145 hours. 2. Positive also for atherosclerotic plaque with significant stenoses: - Right ICA origin high-grade stenosis at the distal bulb approaching a RADIOGRAPHIC STRING SIGN. - Moderate Left supraclinoid ICA stenosis. - Left Subclavian Artery origin 60% stenosis. 3. Mild cytotoxic edema suspected at the left operculum. No hemorrhage or mass effect. 4.  Aortic Atherosclerosis (ICD10-I70.0). Electronically Signed   By: Genevie Ann M.D.   On: 05/07/2018 11:52   Ct Head Code Stroke Wo Contrast  Result Date: 05/07/2018 CLINICAL DATA:  Code stroke. Focal neuro deficit. Right facial droop and aphasia EXAM: CT HEAD WITHOUT CONTRAST TECHNIQUE: Contiguous axial images were obtained from the base of the skull through the vertex without intravenous contrast. COMPARISON:  CT head 05/02/2017 FINDINGS: Brain: Moderate atrophy. Chronic microvascular ischemic change throughout the white matter. Chronic infarct right inferior frontal lobe unchanged. Chronic infarct in the left parietal operculum, left parietal lobe and left occipital lobe unchanged. Chronic lacunar infarction lateral basal ganglia on the left unchanged. Small chronic infarct left cerebellum. Negative for acute infarct, hemorrhage, or mass.  No midline shift. Vascular: Atherosclerotic calcification. Negative for hyperdense vessel Skull: Negative Sinuses/Orbits: Mucosal edema left posterior ethmoid sinus. Negative orbit Other: None ASPECTS ([place] Stroke Program Early CT Score) - Ganglionic level infarction (caudate, lentiform nuclei, internal capsule, insula, M1-M3 cortex): 7 - Supraganglionic infarction (M4-M6 cortex): 3 Total score (0-10 with 10 being normal): 10 IMPRESSION: 1. No acute intracranial abnormality 2. Extensive chronic ischemic changes as above 3. ASPECTS is 10 4. These results were called by telephone at the time of  interpretation on 05/07/2018 at 10:43 am to Dr. Burlene Arnt , who verbally acknowledged these results. Electronically Signed   By: Franchot Gallo M.D.   On: 05/07/2018 10:44   ____________________________________________    PROCEDURES  Procedure(s) performed: None  Procedures  Critical Care performed: None  ____________________________________________   INITIAL IMPRESSION / ASSESSMENT AND PLAN / ED COURSE  Pertinent labs & imaging results that were available during my care of the patient were reviewed by me and considered in my medical decision making (see chart for details).  Almyra Free called a possible code stroke given symptoms.  She was seen and evaluated by Dr. Irish Elders, who recommended a CTA, CTA shows an M3 occlusion but nothing intervenable, patient is outside the window for TPA given chronicity of complaint, we are therefore going to admit her to the hospitalist service if she passes a swallow study I will give her aspirin.  She is already on Plavix and aspirin at home.    ____________________________________________   FINAL CLINICAL IMPRESSION(S) / ED  DIAGNOSES  Final diagnoses:  None      This chart was dictated using voice recognition software.  Despite best efforts to proofread,  errors can occur which can change meaning.      Schuyler Amor, MD 05/07/18 1218

## 2018-05-07 NOTE — Progress Notes (Signed)
Family Meeting Note  Advance Directive:no  Today a meeting took place with the Patient.and family  The following clinical team members were present during this meeting:MD  The following were discussed:Patient's diagnosis: cva , Patient's progosis: Unable to determine and Goals for treatment: Full Code  Additional follow-up to be provided: chaplain consult to create AD and OP palliative care consut  Time spent during discussion:17 minutes  Anayeli Arel, Ulice Bold, MD

## 2018-05-08 ENCOUNTER — Inpatient Hospital Stay: Payer: Medicare Other

## 2018-05-08 LAB — LIPID PANEL
Cholesterol: 113 mg/dL (ref 0–200)
HDL: 56 mg/dL (ref >40)
LDL CALC: 42 mg/dL (ref 0–99)
TRIGLYCERIDES: 76 mg/dL (ref <150)
Total CHOL/HDL Ratio: 2 RATIO
VLDL: 15 mg/dL (ref 0–40)

## 2018-05-08 LAB — HEMOGLOBIN A1C
HEMOGLOBIN A1C: 6.3 % — AB (ref 4.8–5.6)
Mean Plasma Glucose: 134.11 mg/dL

## 2018-05-08 NOTE — Evaluation (Signed)
Physical Therapy Evaluation Patient Details Name: Yolanda Turner MRN: [id number] DOB: 17-Jan-1926 Today's Date: 05/08/2018   History of Present Illness  presented to ER secondary to R facial droop, worsening expressive aphasia; admitted for TIA/CVA work up.  MRI significant for multifocal L hemispheric infacts; admitting NIHSS 7.  Clinical Impression  Upon evaluation, patient alert and oriented; follows commands and demonstrates good effort with all mobility components.  Mild/mod R facial droop noted; significant expressive aphasia (prefers yes/no responses).  Bilat UE/LE strength and ROM grossly symmetrical and WFL; no focal weakness, sensory or gross coordination deficits noted.  Able to complete bed mobility with mod indep; sit/stand, basic transfers and gait (75') without assist device, min assist.  Increased sway, all planes; guarded trunk posturing requiring min assist at all times.  RW issued for additional gait trial (100') with noted improvement in mechanics, symmetry and overall safety/stability with use of assist device. Do recommend continued use of RW with mobility at all times; patient/daughter aware and in agreement. Would benefit from skilled PT to address above deficits and promote optimal return to PLOF; Recommend transition to [place] upon discharge from acute hospitalization.     Follow Up Recommendations Home health PT    Equipment Recommendations  Rolling walker with 5" wheels    Recommendations for Other Services       Precautions / Restrictions Precautions Precautions: Fall Restrictions Weight Bearing Restrictions: No      Mobility  Bed Mobility Overal bed mobility: Modified Independent             General bed mobility comments: Pt positioned to EOB without physical assist.   Transfers Overall transfer level: Needs assistance Equipment used: Rolling walker (2 wheeled) Transfers: Sit to/from Stand Sit to Stand: Min guard         General transfer  comment: cuing for hand placement; mild assist for balance with initial sit/stand  Ambulation/Gait Ambulation/Gait assistance: Min guard Gait Distance (Feet): 75 Feet         General Gait Details: narrowed BOS, mildly excessive L hip flexion (due to mild foot drop); guarded trunk posturing with limited arm swing, trunk rotation.  Increased postural sway, requiring min assist for balance at all times  10' walk time, 8-9 seconds  Stairs            Wheelchair Mobility    Modified Rankin (Stroke Patients Only)       Balance Overall balance assessment: Needs assistance Sitting-balance support: No upper extremity supported;Feet supported Sitting balance-Leahy Scale: Good     Standing balance support: No upper extremity supported Standing balance-Leahy Scale: Fair                               Pertinent Vitals/Pain Pain Assessment: No/denies pain    Home Living Family/patient expects to be discharged to:: Private residence Living Arrangements: Children Available Help at Discharge: Family;Available 24 hours/day Type of Home: House Home Access: Ramped entrance Entrance Stairs-Rails: (one rail) Entrance Stairs-Number of Steps: 2 Home Layout: One level Home Equipment: [place] - 2 wheels;Cane - single point;Tub bench      Prior Function Level of Independence: Independent         Comments: Pt independent with all ADLs. Pt acknowledged she cooked and cleaned up to admission. Pt used SPC for community distances.      Hand Dominance   Dominant Hand: Right    Extremity/Trunk Assessment   Upper Extremity Assessment Upper  Extremity Assessment: Overall WFL for tasks assessed(grossly at least 4/5 throughout; generally symmetrical and WFL; no significant sensory or coordination deficit)    Lower Extremity Assessment Lower Extremity Assessment: Overall WFL for tasks assessed(grossly at least 4/5 throughout, except L ankle DF 3-/5 (after previous surgery);  denies sensory deficit)       Communication   Communication: Expressive difficulties(expressive difficulty at baseline (previous CVA), but worsened with this event.  Accurate yes/no communication)  Cognition Arousal/Alertness: Awake/alert Behavior During Therapy: WFL for tasks assessed/performed Overall Cognitive Status: Within Functional Limits for tasks assessed                                 General Comments: requires yes/no phrasing for expression      General Comments General comments (skin integrity, edema, etc.): Pt very eager to move and be independent. VC required to slow down.     Exercises Other Exercises Other Exercises: 100' with RW, cga--improved gait symmetry and mechanics, improved safety and overall stability, confidence in mobility efforts.  Recommend continued use of RW for all mobility at this time.   Assessment/Plan    PT Assessment Patient needs continued PT services  PT Problem List Decreased strength;Decreased range of motion;Decreased balance;Decreased activity tolerance;Decreased mobility;Decreased coordination;Decreased cognition;Decreased knowledge of use of DME;Decreased safety awareness;Decreased knowledge of precautions       PT Treatment Interventions DME instruction;Gait training;Stair training;Functional mobility training;Therapeutic activities;Therapeutic exercise;Balance training;Patient/family education    PT Goals (Current goals can be found in the Care Plan section)  Acute Rehab PT Goals Patient Stated Goal: to go home PT Goal Formulation: With patient/family Time For Goal Achievement: 05/22/18 Potential to Achieve Goals: Good    Frequency 7X/week   Barriers to discharge Decreased caregiver support      Co-evaluation               AM-PAC PT "6 Clicks" Mobility  Outcome Measure Help needed turning from your back to your side while in a flat bed without using bedrails?: None Help needed moving from lying on your  back to sitting on the side of a flat bed without using bedrails?: None Help needed moving to and from a bed to a chair (including a wheelchair)?: A Little Help needed standing up from a chair using your arms (e.g., wheelchair or bedside chair)?: A Little Help needed to walk in hospital room?: A Little Help needed climbing 3-5 steps with a railing? : A Little 6 Click Score: 20    End of Session Equipment Utilized During Treatment: Gait belt Activity Tolerance: Patient tolerated treatment well Patient left: in bed;with call bell/phone within reach;with bed alarm set Nurse Communication: Mobility status PT Visit Diagnosis: Difficulty in walking, not elsewhere classified (R26.2);Hemiplegia and hemiparesis Hemiplegia - Right/Left: Right Hemiplegia - dominant/non-dominant: Dominant Hemiplegia - caused by: Cerebral infarction    Time: 1025-1055 PT Time Calculation (min) (ACUTE ONLY): 30 min   Charges:   PT Evaluation $PT Eval Moderate Complexity: 1 Mod PT Treatments $Gait Training: 8-22 mins        Quilla Freeze H. Owens Shark, PT, DPT, NCS 05/08/18, 1:22 PM [phone]

## 2018-05-08 NOTE — Evaluation (Signed)
Speech Language Pathology Evaluation Patient Details Name: Yolanda Turner MRN: [id number] DOB: [birth year] Today's Date: 05/08/2018 Time: 3299-2426 SLP Time Calculation (min) (ACUTE ONLY): 30 min  Problem List:  Patient Active Problem List   Diagnosis Date Noted  . CVA (cerebral vascular accident) ([place]) 05/07/2018  . Hypertension 02/02/2018  . Diabetes mellitus without complication ([place]) 83/41/9622  . Carotid artery stenosis 02/02/2018  . Acute CVA (cerebrovascular accident) ([place]) 04/04/2017  . Speech problem 04/02/2017  . Confusion 04/02/2017  . TIA (transient ischemic attack) 04/02/2017   Past Medical History:  Past Medical History:  Diagnosis Date  . Carotid artery stenosis   . Carotid artery stenosis   . Collagen vascular disease ([place])   . Coronary artery disease   . Diabetes mellitus without complication ([facility])    diet control  . GERD (gastroesophageal reflux disease)   . Hypertension   . Myocardial infarction ([place])   . Osteoporosis   . Peripheral vascular disease ([place])   . Pneumonia   . Renal artery stenosis ([place])   . Stroke ([place])   . Uterine cancer [facility])    Past Surgical History:  Past Surgical History:  Procedure Laterality Date  . ABDOMINAL HYSTERECTOMY    . CAROTID ENDARTERECTOMY    . CATARACT EXTRACTION W/PHACO Right 09/28/2015   Procedure: CATARACT EXTRACTION PHACO AND INTRAOCULAR LENS PLACEMENT (IOC);  Surgeon: Estill Cotta, MD;  Location: [facility] ORS;  Service: Ophthalmology;  Laterality: Right;  [place] 041:18 AP% 28.6 CDE 114.94 fluid pack lot # [id number]  . CORONARY ARTERY BYPASS GRAFT    . PERIPHERAL VASCULAR CATHETERIZATION Right 01/22/2015   Procedure: Carotid PTA/Stent Intervention;  Surgeon: Katha Cabal, MD;  Location: [facility];  Service: Cardiovascular;  Laterality: Right;  . uterine cancer surgery     HPI:  84 y.o. female medical history of coronary artery disease status post CEA, left MCA stroke, CAD status post bypass graft  with LIMA to LAD, SVG to PDA and anterior MI, PVD, hyperlipidemia, atrial fibrillation on anticoagulation, diabetes mellitus, uterine cancer, hypertension presented to the ED on 05/07/2018 with chief complaints of aphasia and right facial droop.  Per family in room, the patient has baseline speech deficits (not clearly defined) and "eats like a bird".   Assessment / Plan / Recommendation Clinical Impression  This 84 year old woman with new onset of multifocal ischemia within the left hemisphere and history of stroke with speech deficits (per family) is presenting with severe expressive aphasia characterized by minimal speech output (almost exclusively yes or no).  The patient demonstrates relative strength in auditory comprehension.  Recommend use of yes/no questions to communicate wants/needs.  Per family present, the patient is exhibiting worsened speech deficits and would benefit from referral to home health speech therapy.    SLP Assessment  SLP Recommendation/Assessment: All further Speech Lanaguage Pathology  needs can be addressed in the next venue of care SLP Visit Diagnosis: Aphasia (R47.01);Dysarthria and anarthria (R47.1)    Follow Up Recommendations  Home health SLP    Frequency and Duration           SLP Evaluation Cognition  Overall Cognitive Status: Within Functional Limits for tasks assessed Arousal/Alertness: Awake/alert Orientation Level: Oriented to person;Oriented to place;Oriented to time;Oriented to situation       Comprehension  Auditory Comprehension Overall Auditory Comprehension: Appears within functional limits for tasks assessed    Expression Expression Primary Mode of Expression: Verbal Verbal Expression Overall Verbal Expression: Impaired at baseline Level of Generative/Spontaneous Verbalization: Word Other  Verbal Expression Comments: Functional yes/no responses Written Expression Dominant Hand: Right   Oral / Motor  Motor Speech Overall Motor  Speech: Impaired at baseline Respiration: Within functional limits Phonation: Normal Resonance: Within functional limits Articulation: Impaired Intelligibility: Intelligibility reduced Word: 50-74% accurate   GO                   Leroy Sea, MS/CCC- SLP  Valetta Fuller, Daine [place] 05/08/2018, 12:03 PM

## 2018-05-08 NOTE — Plan of Care (Signed)
Went into room and pt was at sink drinking water.  Daughter said she had just taken her medications.  When asked what medications, patient pulled them out of her purse.  She took gabapentin 300 mg; lisinopril 10 mg; metoprolol 50 mg; zocor 40 mg. Informed Dr. Estanislado Pandy.

## 2018-05-08 NOTE — Progress Notes (Addendum)
[facility] NAME: Yolanda Turner    MR#:  [id number]  DATE OF BIRTH:  Mar 08, 1926  SUBJECTIVE:  CHIEF COMPLAINT:   Chief Complaint  Patient presents with  . Code Stroke  Patient seen and evaluated today Has difficulty in speech No weakness No dysphagia Has facial droop  REVIEW OF SYSTEMS:    ROS  CONSTITUTIONAL: No documented fever. No fatigue, weakness. No weight gain, no weight loss.  EYES: No blurry or double vision.  ENT: No tinnitus. No postnasal drip. No redness of the oropharynx.  RESPIRATORY: No cough, no wheeze, no hemoptysis. No dyspnea.  CARDIOVASCULAR: No chest pain. No orthopnea. No palpitations. No syncope.  GASTROINTESTINAL: No nausea, no vomiting or diarrhea. No abdominal pain. No melena or hematochezia.  GENITOURINARY: No dysuria or hematuria.  ENDOCRINE: No polyuria or nocturia. No heat or cold intolerance.  HEMATOLOGY: No anemia. No bruising. No bleeding.  INTEGUMENTARY: No rashes. No lesions.  MUSCULOSKELETAL: No arthritis. No swelling. No gout.  NEUROLOGIC: No numbness, tingling, or ataxia. No seizure-type activity.  Difficulty in speech Facial droop PSYCHIATRIC: No anxiety. No insomnia. No ADD.   DRUG ALLERGIES:   Allergies  Allergen Reactions  . Penicillins Rash    Has patient had a PCN reaction causing immediate rash, facial/tongue/throat swelling, SOB or lightheadedness with hypotension: No Has patient had a PCN reaction causing severe rash involving mucus membranes or skin necrosis: No Has patient had a PCN reaction that required hospitalization: No Has patient had a PCN reaction occurring within the last 10 years: No If all of the above answers are "NO", then may proceed with Cephalosporin use.    VITALS:  Blood pressure 132/82, pulse 79, temperature 98.8 F (37.1 C), temperature source Oral, resp. rate 18, height 5' (1.524 m), weight 32.9 kg, SpO2 92 %.  PHYSICAL EXAMINATION:   Physical  Exam  GENERAL:  84 y.o.-year-old patient lying in the bed with no acute distress.  EYES: Pupils equal, round, reactive to light and accommodation. No scleral icterus. Extraocular muscles intact.  HEENT: Head atraumatic, normocephalic. Oropharynx and nasopharynx clear.  NECK:  Supple, no jugular venous distention. No thyroid enlargement, no tenderness.  LUNGS: Normal breath sounds bilaterally, no wheezing, rales, rhonchi. No use of accessory muscles of respiration.  CARDIOVASCULAR: S1, S2 normal. No murmurs, rubs, or gallops.  ABDOMEN: Soft, nontender, nondistended. Bowel sounds present. No organomegaly or mass.  EXTREMITIES: No cyanosis, clubbing or edema b/l.    NEUROLOGIC: Has aphasia. No focal Motor or sensory deficits b/l.   PSYCHIATRIC: The patient is alert and oriented x 3.  SKIN: No obvious rash, lesion, or ulcer.   LABORATORY PANEL:   CBC Recent Labs  Lab 05/07/18 1038  WBC 4.6  HGB 12.9  HCT 40.4  PLT 170   ------------------------------------------------------------------------------------------------------------------ Chemistries  Recent Labs  Lab 05/07/18 1038  NA 143  K 5.0  CL 106  CO2 29  GLUCOSE 128*  BUN 19  CREATININE 1.00  CALCIUM 9.6  AST 23  ALT 15  ALKPHOS 50  BILITOT 0.7   ------------------------------------------------------------------------------------------------------------------  Cardiac Enzymes Recent Labs  Lab 05/07/18 1038  TROPONINI <0.03   ------------------------------------------------------------------------------------------------------------------  RADIOLOGY:  Ct Angio Head W Or Wo Contrast  Result Date: 05/07/2018 CLINICAL DATA:  84 year old female code stroke with right facial droop and aphasia. EXAM: CT ANGIOGRAPHY HEAD AND NECK TECHNIQUE: Multidetector CT imaging of the head and neck was performed using the standard protocol during bolus administration of intravenous contrast.  Multiplanar CT image reconstructions and  MIPs were obtained to evaluate the vascular anatomy. Carotid stenosis measurements (when applicable) are obtained utilizing NASCET criteria, using the distal internal carotid diameter as the denominator. CONTRAST:  84mL OMNIPAQUE IOHEXOL 350 MG/ML SOLN COMPARISON:  Head CT without contrast 1033 hours today. FINDINGS: CTA NECK Skeleton: Nearly absent dentition. Degenerative changes throughout the cervical spine. Osteopenia. Prior sternotomy. No acute osseous abnormality identified. Upper chest: Confluent apical lung scarring with mild bronchiectasis. No superior mediastinal lymphadenopathy. Other neck: Negative. Aortic arch: Calcified aortic atherosclerosis. 3 vessel arch configuration. Right carotid system: No brachiocephalic or right CCA origin stenosis despite plaque. Bulky calcified plaque at the right ICA origin and bulb resulting in high-grade short-segment stenosis at the distal bulb approaching a radiographic string sign (series 4, image 65 and series 8, image 47. The right ICA remains patent to the skull base with mild tortuosity but no additional stenosis. Left carotid system: No left CCA stenosis despite plaque. Little plaque at the left carotid bifurcation and no cervical left ICA stenosis. Vertebral arteries: Vertebral no proximal right subclavian artery origin artery or stenosis despite right plaque. Patent and mildly dolichoectatic right vertebral artery to the skull base. Bulky proximal left subclavian artery plaque with stenosis estimated at 60 % with respect to the distal vessel. Soft and calcified plaque at the left vertebral artery origin with mild to moderate stenosis. No additional left vertebral artery stenosis to the skull base. CTA HEAD Posterior circulation: Right V4 segment calcified plaque but no distal vertebral stenosis. The right vertebral is mildly dominant. Normal PICA origins. Patent vertebrobasilar junction and basilar artery without stenosis. Mild ectatic basilar tip. Normal SCA  and PCA origins. Posterior communicating arteries are diminutive or absent. Normal bilateral PCA branches. Anterior circulation: Both ICA siphons are patent. Moderate to severe calcified siphon plaque bilaterally with mild to moderate left and mild right supraclinoid ICA stenosis. Normal ophthalmic artery origins. Patent carotid termini. Normal MCA and ACA origins. Anterior communicating artery and bilateral ACA branches are within normal limits. Right MCA M1 segment, right MCA bifurcation, and right MCA branches are within normal limits. The left MCA M1 segment and bifurcation are patent without stenosis. No left MCA M2 branch occlusion is identified, but there is a left M3 branch occlusion in the middle division best seen on series 11, image 23. The other left MCA branches appear within normal limits. Venous sinuses: Patent. Anatomic variants: Mildly dominant right vertebral artery. Delayed phase: There are some cytotoxic edema changes in the left operculum suspected on series 12, image 14 but these are subtle due to being superimposed on chronic posterior left MCA encephalomalacia. No abnormal enhancement identified. Review of the MIP images confirms the above findings IMPRESSION: 1. Negative for LVO but positive for Left MCA M3 branch occlusion in the middle division. This was discussed by telephone with Dr. Charlotte Crumb on 05/07/2018 at 1145 hours. 2. Positive also for atherosclerotic plaque with significant stenoses: - Right ICA origin high-grade stenosis at the distal bulb approaching a RADIOGRAPHIC STRING SIGN. - Moderate Left supraclinoid ICA stenosis. - Left Subclavian Artery origin 60% stenosis. 3. Mild cytotoxic edema suspected at the left operculum. No hemorrhage or mass effect. 4.  Aortic Atherosclerosis (ICD10-I70.0). Electronically Signed   By: Genevie Ann M.D.   On: 05/07/2018 11:52   Ct Angio Neck W And/or Wo Contrast  Result Date: 05/07/2018 CLINICAL DATA:  84 year old female code stroke with  right facial droop and aphasia. EXAM: CT ANGIOGRAPHY HEAD AND NECK TECHNIQUE:  Multidetector CT imaging of the head and neck was performed using the standard protocol during bolus administration of intravenous contrast. Multiplanar CT image reconstructions and MIPs were obtained to evaluate the vascular anatomy. Carotid stenosis measurements (when applicable) are obtained utilizing NASCET criteria, using the distal internal carotid diameter as the denominator. CONTRAST:  56mL OMNIPAQUE IOHEXOL 350 MG/ML SOLN COMPARISON:  Head CT without contrast 1033 hours today. FINDINGS: CTA NECK Skeleton: Nearly absent dentition. Degenerative changes throughout the cervical spine. Osteopenia. Prior sternotomy. No acute osseous abnormality identified. Upper chest: Confluent apical lung scarring with mild bronchiectasis. No superior mediastinal lymphadenopathy. Other neck: Negative. Aortic arch: Calcified aortic atherosclerosis. 3 vessel arch configuration. Right carotid system: No brachiocephalic or right CCA origin stenosis despite plaque. Bulky calcified plaque at the right ICA origin and bulb resulting in high-grade short-segment stenosis at the distal bulb approaching a radiographic string sign (series 4, image 65 and series 8, image 47. The right ICA remains patent to the skull base with mild tortuosity but no additional stenosis. Left carotid system: No left CCA stenosis despite plaque. Little plaque at the left carotid bifurcation and no cervical left ICA stenosis. Vertebral arteries: Vertebral no proximal right subclavian artery origin artery or stenosis despite right plaque. Patent and mildly dolichoectatic right vertebral artery to the skull base. Bulky proximal left subclavian artery plaque with stenosis estimated at 60 % with respect to the distal vessel. Soft and calcified plaque at the left vertebral artery origin with mild to moderate stenosis. No additional left vertebral artery stenosis to the skull base. CTA HEAD  Posterior circulation: Right V4 segment calcified plaque but no distal vertebral stenosis. The right vertebral is mildly dominant. Normal PICA origins. Patent vertebrobasilar junction and basilar artery without stenosis. Mild ectatic basilar tip. Normal SCA and PCA origins. Posterior communicating arteries are diminutive or absent. Normal bilateral PCA branches. Anterior circulation: Both ICA siphons are patent. Moderate to severe calcified siphon plaque bilaterally with mild to moderate left and mild right supraclinoid ICA stenosis. Normal ophthalmic artery origins. Patent carotid termini. Normal MCA and ACA origins. Anterior communicating artery and bilateral ACA branches are within normal limits. Right MCA M1 segment, right MCA bifurcation, and right MCA branches are within normal limits. The left MCA M1 segment and bifurcation are patent without stenosis. No left MCA M2 branch occlusion is identified, but there is a left M3 branch occlusion in the middle division best seen on series 11, image 23. The other left MCA branches appear within normal limits. Venous sinuses: Patent. Anatomic variants: Mildly dominant right vertebral artery. Delayed phase: There are some cytotoxic edema changes in the left operculum suspected on series 12, image 14 but these are subtle due to being superimposed on chronic posterior left MCA encephalomalacia. No abnormal enhancement identified. Review of the MIP images confirms the above findings IMPRESSION: 1. Negative for LVO but positive for Left MCA M3 branch occlusion in the middle division. This was discussed by telephone with Dr. Charlotte Crumb on 05/07/2018 at 1145 hours. 2. Positive also for atherosclerotic plaque with significant stenoses: - Right ICA origin high-grade stenosis at the distal bulb approaching a RADIOGRAPHIC STRING SIGN. - Moderate Left supraclinoid ICA stenosis. - Left Subclavian Artery origin 60% stenosis. 3. Mild cytotoxic edema suspected at the left operculum.  No hemorrhage or mass effect. 4.  Aortic Atherosclerosis (ICD10-I70.0). Electronically Signed   By: Genevie Ann M.D.   On: 05/07/2018 11:52   Mr Brain Wo Contrast  Result Date: 05/08/2018 CLINICAL DATA:  Aphasia  and right facial droop. EXAM: MRI HEAD WITHOUT CONTRAST TECHNIQUE: Multiplanar, multiecho pulse sequences of the brain and surrounding structures were obtained without intravenous contrast. COMPARISON:  CTA head neck 05/07/2018 FINDINGS: BRAIN: There is acute cortical ischemia within the left frontal lobe, predominantly involving the operculum and insula. There is a small area of acute ischemia within the posterior left parietal lobe. The midline structures are normal. No midline shift or other mass effect. There are old infarcts of the right frontal, left parietal and left occipital lobes. Diffuse confluent hyperintense T2-weighted signal within the periventricular, deep and juxtacortical white matter, most commonly due to chronic ischemic microangiopathy. Generalized atrophy without lobar predilection. Susceptibility-sensitive sequences show no chronic microhemorrhage or superficial siderosis. VASCULAR: Major intracranial arterial and venous sinus flow voids are normal. SKULL AND UPPER CERVICAL SPINE: Calvarial bone marrow signal is normal. There is no skull base mass. Visualized upper cervical spine and soft tissues are normal. SINUSES/ORBITS: No fluid levels or advanced mucosal thickening. No mastoid or middle ear effusion. The orbits are normal. IMPRESSION: 1. Multifocal acute ischemia within the left hemisphere, predominantly involving the left frontal operculum and insula. 2. No hemorrhage or mass effect. 3. Multiple old infarcts and sequelae of chronic ischemic microangiopathy. Electronically Signed   By: Ulyses Jarred M.D.   On: 05/08/2018 03:45   Ct Head Code Stroke Wo Contrast  Result Date: 05/07/2018 CLINICAL DATA:  Code stroke. Focal neuro deficit. Right facial droop and aphasia EXAM: CT  HEAD WITHOUT CONTRAST TECHNIQUE: Contiguous axial images were obtained from the base of the skull through the vertex without intravenous contrast. COMPARISON:  CT head 05/02/2017 FINDINGS: Brain: Moderate atrophy. Chronic microvascular ischemic change throughout the white matter. Chronic infarct right inferior frontal lobe unchanged. Chronic infarct in the left parietal operculum, left parietal lobe and left occipital lobe unchanged. Chronic lacunar infarction lateral basal ganglia on the left unchanged. Small chronic infarct left cerebellum. Negative for acute infarct, hemorrhage, or mass.  No midline shift. Vascular: Atherosclerotic calcification. Negative for hyperdense vessel Skull: Negative Sinuses/Orbits: Mucosal edema left posterior ethmoid sinus. Negative orbit Other: None ASPECTS ([place] Stroke Program Early CT Score) - Ganglionic level infarction (caudate, lentiform nuclei, internal capsule, insula, M1-M3 cortex): 7 - Supraganglionic infarction (M4-M6 cortex): 3 Total score (0-10 with 10 being normal): 10 IMPRESSION: 1. No acute intracranial abnormality 2. Extensive chronic ischemic changes as above 3. ASPECTS is 10 4. These results were called by telephone at the time of interpretation on 05/07/2018 at 10:43 am to Dr. Burlene Arnt , who verbally acknowledged these results. Electronically Signed   By: Franchot Gallo M.D.   On: 05/07/2018 10:44     ASSESSMENT AND PLAN:  84 year old female patient with history of coronary artery disease, carotid artery disease, diabetes mellitus type 2, GERD, hypertension, myocardial infarction, renal artery stenosis on aspirin and Plavix at home presented to the emergency room for facial droop and aphasia  -Acute ischemic CVA with aphasia Continue aspirin and Plavix Speech therapy and physical therapy evaluation Appreciate neurology follow-up Most probably embolic in etiology Anticoagulation will be started after 7 days Needs aggressive speech therapy  -Acute  aphasia secondary to CVA Speech therapy evaluation  -Coronary artery disease Continue aspirin Plavix ACE inhibitor metoprolol Zocor  -Diet controlled diabetes mellitus  All the records are reviewed and case discussed with Care Management/Social Worker. Management plans discussed with the patient, family and they are in agreement.  CODE STATUS: Full code DVT Prophylaxis: SCDs  TOTAL TIME TAKING CARE OF THIS PATIENT: 67  minutes.   POSSIBLE D/C IN 1 DAYS, DEPENDING ON CLINICAL CONDITION.  Saundra Shelling M.D on 05/08/2018 at 2:17 PM  Between 7am to 6pm - Pager - [phone]  After 6pm go to [email or web address] - password [email or web address] [facility]  Office  [phone]  CC: Primary care physician; Albina Billet, MD  Note: This dictation was prepared with Dragon dictation along with smaller phrase technology. Any transcriptional errors that result from this process are unintentional.

## 2018-05-08 NOTE — Care Management Note (Addendum)
Case Management Note  Patient Details  Name: Yolanda Turner MRN: [id number] Date of Birth: 15-Jan-1926  Subjective/Objective:                 Patient presents from home with facial droop and aphasia.  Previous CVA in the past.  MRI positive.  Patient has adequate caregiver support from daughters and granddaughter that live in the same home per daughter Geroge Baseman. She says patient can be "quite feisty."   Current with PCP. Uses a cane. No issues accessing medical care or with transportation. Like the agency "that had her before."  It appears agency was [facility].   Action/Plan:  Heads up referral for RN PT SLP with [facility].  [facility] for walker.  Expected Discharge Date:                  Expected Discharge Plan:     In-House Referral:     Discharge planning Services     Post Acute Care Choice:    Choice offered to:     DME Arranged:    DME Agency:     HH Arranged:    HH Agency:     Status of Service:     If discussed at [employer] [facility] [employer], dates discussed:    Additional Comments:  Katrina Stack, RN 05/08/2018, 3:52 PM

## 2018-05-08 NOTE — Progress Notes (Signed)
Subjective: Patient remains awake, alert and oriented x 4. Remains aphasic with right facial droop.  No new stroke or stroke-like symptoms. Patient was able to get up with PT today, ready to DC home with home health and speech therapist.  Objective: Current vital signs: BP 132/82 (BP Location: Left Arm)   Pulse 79   Temp 98.8 F (37.1 C) (Oral)   Resp 18   Ht 5' (1.524 m)   Wt 32.9 kg   SpO2 92%   BMI 14.17 kg/m  Vital signs in last 24 hours: Temp:  [97.5 F (36.4 C)-98.8 F (37.1 C)] 98.8 F (37.1 C) (11/26 7001) Pulse Rate:  [65-80] 79 (11/26 0613) Resp:  [11-18] 18 (11/26 0613) BP: (125-167)/(55-82) 132/82 (11/26 0613) SpO2:  [92 %-100 %] 92 % (11/26 7494) Weight:  [32.9 kg-33.4 kg] 32.9 kg (11/25 1253)  Intake/Output from previous day: 11/25 0701 - 11/26 0700 In: 45.6 [I.V.:45.6] Out: -  Intake/Output this shift: No intake/output data recorded. Nutritional status:  Diet Order            Diet Heart Room service appropriate? Yes; Fluid consistency: Thin  Diet effective now             Neurological Exam  Mental Status: Alert, oriented, thought content appropriate. Severe aphasia. Able to follow 3 step commands without difficulty. Attention span and concentration seemed appropriate  Cranial Nerves: II: Discs flat bilaterally; Visual fields grossly normal, pupils equal, round, reactive to light and accommodation III,IV, VI: ptosis not present, extra-ocular motions intact bilaterally V,VII: right facial droop, facial light touch sensationintact VIII: hearing normal bilaterally IX,X: gag reflex present XI: bilateral shoulder shrug XII: midline tongue extension Motor: Right :Upper extremity 5/5Without pronator driftLeft: Upper extremity 5/5 without pronator drift Right:Lower extremity 5/5Left: Lower extremity 5/5 Tone and bulk:normal tone throughout; no atrophy noted Sensory: Pinprick and light  touchintact bilaterally Deep Tendon Reflexes: 2+ and symmetric throughout Plantars: Right:muteLeft: mute Cerebellar: Finger-to-nosetesting intact bilaterally.Heel to shin testing normal bilaterally Gait: not tested due to safety concerns  Lab Results: Basic Metabolic Panel: Recent Labs  Lab 05/07/18 1038  NA 143  K 5.0  CL 106  CO2 29  GLUCOSE 128*  BUN 19  CREATININE 1.00  CALCIUM 9.6    Liver Function Tests: Recent Labs  Lab 05/07/18 1038  AST 23  ALT 15  ALKPHOS 50  BILITOT 0.7  PROT 6.7  ALBUMIN 4.1   No results for input(s): LIPASE, AMYLASE in the last 168 hours. No results for input(s): AMMONIA in the last 168 hours.  CBC: Recent Labs  Lab 05/07/18 1038  WBC 4.6  NEUTROABS 3.3  HGB 12.9  HCT 40.4  MCV 91.2  PLT 170    Cardiac Enzymes: Recent Labs  Lab 05/07/18 1038  TROPONINI <0.03    Lipid Panel: Recent Labs  Lab 05/08/18 0502  CHOL 113  TRIG 76  HDL 56  CHOLHDL 2.0  VLDL 15  LDLCALC 42    CBG: Recent Labs  Lab 05/07/18 1028  GLUCAP 100*    Microbiology: No results found for this or any previous visit.  Coagulation Studies: Recent Labs    05/07/18 1038  LABPROT 13.4  INR 1.03    Imaging: Ct Angio Head W Or Wo Contrast  Result Date: 05/07/2018 CLINICAL DATA:  84 year old female code stroke with right facial droop and aphasia. EXAM: CT ANGIOGRAPHY HEAD AND NECK TECHNIQUE: Multidetector CT imaging of the head and neck was performed using the standard protocol during bolus administration  of intravenous contrast. Multiplanar CT image reconstructions and MIPs were obtained to evaluate the vascular anatomy. Carotid stenosis measurements (when applicable) are obtained utilizing NASCET criteria, using the distal internal carotid diameter as the denominator. CONTRAST:  46mL OMNIPAQUE IOHEXOL 350 MG/ML SOLN COMPARISON:  Head CT without contrast 1033 hours today. FINDINGS: CTA NECK Skeleton: Nearly  absent dentition. Degenerative changes throughout the cervical spine. Osteopenia. Prior sternotomy. No acute osseous abnormality identified. Upper chest: Confluent apical lung scarring with mild bronchiectasis. No superior mediastinal lymphadenopathy. Other neck: Negative. Aortic arch: Calcified aortic atherosclerosis. 3 vessel arch configuration. Right carotid system: No brachiocephalic or right CCA origin stenosis despite plaque. Bulky calcified plaque at the right ICA origin and bulb resulting in high-grade short-segment stenosis at the distal bulb approaching a radiographic string sign (series 4, image 65 and series 8, image 47. The right ICA remains patent to the skull base with mild tortuosity but no additional stenosis. Left carotid system: No left CCA stenosis despite plaque. Little plaque at the left carotid bifurcation and no cervical left ICA stenosis. Vertebral arteries: Vertebral no proximal right subclavian artery origin artery or stenosis despite right plaque. Patent and mildly dolichoectatic right vertebral artery to the skull base. Bulky proximal left subclavian artery plaque with stenosis estimated at 60 % with respect to the distal vessel. Soft and calcified plaque at the left vertebral artery origin with mild to moderate stenosis. No additional left vertebral artery stenosis to the skull base. CTA HEAD Posterior circulation: Right V4 segment calcified plaque but no distal vertebral stenosis. The right vertebral is mildly dominant. Normal PICA origins. Patent vertebrobasilar junction and basilar artery without stenosis. Mild ectatic basilar tip. Normal SCA and PCA origins. Posterior communicating arteries are diminutive or absent. Normal bilateral PCA branches. Anterior circulation: Both ICA siphons are patent. Moderate to severe calcified siphon plaque bilaterally with mild to moderate left and mild right supraclinoid ICA stenosis. Normal ophthalmic artery origins. Patent carotid termini. Normal  MCA and ACA origins. Anterior communicating artery and bilateral ACA branches are within normal limits. Right MCA M1 segment, right MCA bifurcation, and right MCA branches are within normal limits. The left MCA M1 segment and bifurcation are patent without stenosis. No left MCA M2 branch occlusion is identified, but there is a left M3 branch occlusion in the middle division best seen on series 11, image 23. The other left MCA branches appear within normal limits. Venous sinuses: Patent. Anatomic variants: Mildly dominant right vertebral artery. Delayed phase: There are some cytotoxic edema changes in the left operculum suspected on series 12, image 14 but these are subtle due to being superimposed on chronic posterior left MCA encephalomalacia. No abnormal enhancement identified. Review of the MIP images confirms the above findings IMPRESSION: 1. Negative for LVO but positive for Left MCA M3 branch occlusion in the middle division. This was discussed by telephone with Dr. Charlotte Crumb on 05/07/2018 at 1145 hours. 2. Positive also for atherosclerotic plaque with significant stenoses: - Right ICA origin high-grade stenosis at the distal bulb approaching a RADIOGRAPHIC STRING SIGN. - Moderate Left supraclinoid ICA stenosis. - Left Subclavian Artery origin 60% stenosis. 3. Mild cytotoxic edema suspected at the left operculum. No hemorrhage or mass effect. 4.  Aortic Atherosclerosis (ICD10-I70.0). Electronically Signed   By: Genevie Ann M.D.   On: 05/07/2018 11:52   Ct Angio Neck W And/or Wo Contrast  Result Date: 05/07/2018 CLINICAL DATA:  84 year old female code stroke with right facial droop and aphasia. EXAM: CT ANGIOGRAPHY HEAD AND  NECK TECHNIQUE: Multidetector CT imaging of the head and neck was performed using the standard protocol during bolus administration of intravenous contrast. Multiplanar CT image reconstructions and MIPs were obtained to evaluate the vascular anatomy. Carotid stenosis measurements (when  applicable) are obtained utilizing NASCET criteria, using the distal internal carotid diameter as the denominator. CONTRAST:  27mL OMNIPAQUE IOHEXOL 350 MG/ML SOLN COMPARISON:  Head CT without contrast 1033 hours today. FINDINGS: CTA NECK Skeleton: Nearly absent dentition. Degenerative changes throughout the cervical spine. Osteopenia. Prior sternotomy. No acute osseous abnormality identified. Upper chest: Confluent apical lung scarring with mild bronchiectasis. No superior mediastinal lymphadenopathy. Other neck: Negative. Aortic arch: Calcified aortic atherosclerosis. 3 vessel arch configuration. Right carotid system: No brachiocephalic or right CCA origin stenosis despite plaque. Bulky calcified plaque at the right ICA origin and bulb resulting in high-grade short-segment stenosis at the distal bulb approaching a radiographic string sign (series 4, image 65 and series 8, image 47. The right ICA remains patent to the skull base with mild tortuosity but no additional stenosis. Left carotid system: No left CCA stenosis despite plaque. Little plaque at the left carotid bifurcation and no cervical left ICA stenosis. Vertebral arteries: Vertebral no proximal right subclavian artery origin artery or stenosis despite right plaque. Patent and mildly dolichoectatic right vertebral artery to the skull base. Bulky proximal left subclavian artery plaque with stenosis estimated at 60 % with respect to the distal vessel. Soft and calcified plaque at the left vertebral artery origin with mild to moderate stenosis. No additional left vertebral artery stenosis to the skull base. CTA HEAD Posterior circulation: Right V4 segment calcified plaque but no distal vertebral stenosis. The right vertebral is mildly dominant. Normal PICA origins. Patent vertebrobasilar junction and basilar artery without stenosis. Mild ectatic basilar tip. Normal SCA and PCA origins. Posterior communicating arteries are diminutive or absent. Normal  bilateral PCA branches. Anterior circulation: Both ICA siphons are patent. Moderate to severe calcified siphon plaque bilaterally with mild to moderate left and mild right supraclinoid ICA stenosis. Normal ophthalmic artery origins. Patent carotid termini. Normal MCA and ACA origins. Anterior communicating artery and bilateral ACA branches are within normal limits. Right MCA M1 segment, right MCA bifurcation, and right MCA branches are within normal limits. The left MCA M1 segment and bifurcation are patent without stenosis. No left MCA M2 branch occlusion is identified, but there is a left M3 branch occlusion in the middle division best seen on series 11, image 23. The other left MCA branches appear within normal limits. Venous sinuses: Patent. Anatomic variants: Mildly dominant right vertebral artery. Delayed phase: There are some cytotoxic edema changes in the left operculum suspected on series 12, image 14 but these are subtle due to being superimposed on chronic posterior left MCA encephalomalacia. No abnormal enhancement identified. Review of the MIP images confirms the above findings IMPRESSION: 1. Negative for LVO but positive for Left MCA M3 branch occlusion in the middle division. This was discussed by telephone with Dr. Charlotte Crumb on 05/07/2018 at 1145 hours. 2. Positive also for atherosclerotic plaque with significant stenoses: - Right ICA origin high-grade stenosis at the distal bulb approaching a RADIOGRAPHIC STRING SIGN. - Moderate Left supraclinoid ICA stenosis. - Left Subclavian Artery origin 60% stenosis. 3. Mild cytotoxic edema suspected at the left operculum. No hemorrhage or mass effect. 4.  Aortic Atherosclerosis (ICD10-I70.0). Electronically Signed   By: Genevie Ann M.D.   On: 05/07/2018 11:52   Mr Brain Wo Contrast  Result Date: 05/08/2018 CLINICAL DATA:  Aphasia and right facial droop. EXAM: MRI HEAD WITHOUT CONTRAST TECHNIQUE: Multiplanar, multiecho pulse sequences of the brain and  surrounding structures were obtained without intravenous contrast. COMPARISON:  CTA head neck 05/07/2018 FINDINGS: BRAIN: There is acute cortical ischemia within the left frontal lobe, predominantly involving the operculum and insula. There is a small area of acute ischemia within the posterior left parietal lobe. The midline structures are normal. No midline shift or other mass effect. There are old infarcts of the right frontal, left parietal and left occipital lobes. Diffuse confluent hyperintense T2-weighted signal within the periventricular, deep and juxtacortical white matter, most commonly due to chronic ischemic microangiopathy. Generalized atrophy without lobar predilection. Susceptibility-sensitive sequences show no chronic microhemorrhage or superficial siderosis. VASCULAR: Major intracranial arterial and venous sinus flow voids are normal. SKULL AND UPPER CERVICAL SPINE: Calvarial bone marrow signal is normal. There is no skull base mass. Visualized upper cervical spine and soft tissues are normal. SINUSES/ORBITS: No fluid levels or advanced mucosal thickening. No mastoid or middle ear effusion. The orbits are normal. IMPRESSION: 1. Multifocal acute ischemia within the left hemisphere, predominantly involving the left frontal operculum and insula. 2. No hemorrhage or mass effect. 3. Multiple old infarcts and sequelae of chronic ischemic microangiopathy. Electronically Signed   By: Ulyses Jarred M.D.   On: 05/08/2018 03:45   Ct Head Code Stroke Wo Contrast  Result Date: 05/07/2018 CLINICAL DATA:  Code stroke. Focal neuro deficit. Right facial droop and aphasia EXAM: CT HEAD WITHOUT CONTRAST TECHNIQUE: Contiguous axial images were obtained from the base of the skull through the vertex without intravenous contrast. COMPARISON:  CT head 05/02/2017 FINDINGS: Brain: Moderate atrophy. Chronic microvascular ischemic change throughout the white matter. Chronic infarct right inferior frontal lobe unchanged.  Chronic infarct in the left parietal operculum, left parietal lobe and left occipital lobe unchanged. Chronic lacunar infarction lateral basal ganglia on the left unchanged. Small chronic infarct left cerebellum. Negative for acute infarct, hemorrhage, or mass.  No midline shift. Vascular: Atherosclerotic calcification. Negative for hyperdense vessel Skull: Negative Sinuses/Orbits: Mucosal edema left posterior ethmoid sinus. Negative orbit Other: None ASPECTS ([place] Stroke Program Early CT Score) - Ganglionic level infarction (caudate, lentiform nuclei, internal capsule, insula, M1-M3 cortex): 7 - Supraganglionic infarction (M4-M6 cortex): 3 Total score (0-10 with 10 being normal): 10 IMPRESSION: 1. No acute intracranial abnormality 2. Extensive chronic ischemic changes as above 3. ASPECTS is 10 4. These results were called by telephone at the time of interpretation on 05/07/2018 at 10:43 am to Dr. Burlene Arnt , who verbally acknowledged these results. Electronically Signed   By: Franchot Gallo M.D.   On: 05/07/2018 10:44    Medications:  I have reviewed the patient's current medications. Prior to Admission:  Medications Prior to Admission  Medication Sig Dispense Refill Last Dose  . aspirin EC 81 MG tablet Take 81 mg by mouth daily.   Past Week at [employer]  . clopidogrel (PLAVIX) 75 MG tablet Take 75 mg by mouth at bedtime.   05/06/2018 at 2000  . gabapentin (NEURONTIN) 300 MG capsule Take 300 mg by mouth at bedtime.   05/06/2018 at 2000  . lisinopril (PRINIVIL,ZESTRIL) 10 MG tablet Take 10 mg by mouth at bedtime.   1 05/06/2018 at 2000  . metoCLOPramide (REGLAN) 10 MG tablet Take 1 tablet (10 mg total) every 8 (eight) hours as needed by mouth. (Patient taking differently: Take 10 mg by mouth every 8 (eight) hours as needed for nausea or vomiting. ) 20 tablet 0 prn  at prn  . metoprolol succinate (TOPROL-XL) 50 MG 24 hr tablet Take 25 mg by mouth at bedtime.   5 05/06/2018 at 2000  . simvastatin (ZOCOR) 40  MG tablet Take 40 mg by mouth at bedtime.   5 05/06/2018 at 2000   Scheduled: . aspirin EC  81 mg Oral Daily  . clopidogrel  75 mg Oral QHS  . enoxaparin (LOVENOX) injection  30 mg Subcutaneous Q24H  . gabapentin  300 mg Oral QHS  . simvastatin  40 mg Oral QHS   Assessment: 84 y.o. female medical history of coronary artery disease status post CEA, left MCA stroke, CAD status post bypass graft with LIMA to LAD, SVG to PDA and anterior MI, PVD, hyperlipidemia, atrial fibrillation on anticoagulation, diabetes mellitus, uterine cancer, hypertension presenting to the ED today with chief complaints of aphasia and right facial droop.   MRI brain reviewed and showed multifocal acute ischemia within the left hemisphere, predominantly involving the left frontal operculum and insula.  Etiology likely embolic.  Echocardiogram did not show cardiac source of emboli.  Patient with history of paroxysmal nonvalvular atrial fibrillation, not been on anticoagulation medication for stroke risk reduction due to concerns of bleeding and fall as per the family. Discussed risk and benefits of anticoagulation with patient and family who agree to follow up with patient's cardiologist in the next week for consideration of anticoagulation.  Hemoglobin A1c 6.3, LDL 42.  Plan: 1. Prophylactic therapy-Antiplatelet med: Aspirin - dose 81 mg and Plavix 75 mg/day.  Re-evaluate need for anticoagulation within 5 to 10 days to avoid risk of hemorrhagic transformation of acute ischemic stroke.  2. PT consult, OT consult, Speech consult 3. Telemetry monitoring 4. Neuro check per protocol  5.  She has a scheduled follow-up appointment with his neurologist on 05/17/2018.  This patient was staffed with Dr. Irish Elders, Alease Frame who personally evaluated patient, reviewed documentation and agreed with assessment and plan of care as above.  Rufina Falco, DNP, FNP-BC Board certified Nurse Practitioner Neurology Department     LOS: 1 day    @MPKSIGN @ 05/08/2018  10:14 AM

## 2018-05-08 NOTE — Evaluation (Signed)
Occupational Therapy Evaluation Patient Details Name: Yolanda Turner MRN: [id number] DOB: April 21, 1926 Today's Date: 05/08/2018    History of Present Illness 84 y.o. female medical history of coronary artery disease status post CEA, left MCA stroke, CAD status post bypass graft with LIMA to LAD, SVG to PDA and anterior MI, PVD, hyperlipidemia, atrial fibrillation on anticoagulation, diabetes mellitus, uterine cancer, hypertension presented to the ED on 05/07/2018 with chief complaints of aphasia and right facial droop.   Clinical Impression   Pt seen for OT evaluation this date. Prior to hospital admission, pt was independent in all aspects of ADL/IADL, using a SPC for mobility outside the home, and denies falls history in past 12 months. Pt presented in bed and acknowledged her difficulties with speech. Pt provided all evaluation information. Pt able to follow single and multi-step commands consisently, has full sensation, and is A &O x 4. Pt continues to show deficits in speech providing one word answers throughout session and R side facial droop. Currently, pt demonstrates impairments in (See OT Problem List below) requiring Supervision/CGA for LB dressing and bathing tasks.  Pt near baseline independence with mild BLE strength and balance deficits. No focal weakness, sensory, coordination, cognitive, or visual deficits appreciated with assessment. No skilled OT needs identified. Will sign off. Please re-consult if additional OT needs arise.    Follow Up Recommendations  No OT follow up    Equipment Recommendations  None recommended by OT    Recommendations for Other Services       Precautions / Restrictions Precautions Precautions: Fall Restrictions Weight Bearing Restrictions: No      Mobility Bed Mobility Overal bed mobility: Independent             General bed mobility comments: Pt positioned to EOB without physical assist.   Transfers Overall transfer level: Needs  assistance Equipment used: Rolling walker (2 wheeled) Transfers: Sit to/from Stand Sit to Stand: Min guard              Balance Overall balance assessment: Needs assistance Sitting-balance support: Feet supported;No upper extremity supported Sitting balance-Leahy Scale: Good     Standing balance support: Bilateral upper extremity supported Standing balance-Leahy Scale: Fair                             ADL either performed or assessed with clinical judgement   ADL Overall ADL's : Needs assistance/impaired Eating/Feeding: Independent;Sitting   Grooming: Standing;Min guard Grooming Details (indicate cue type and reason): Pt washed face at sink with CGA  Upper Body Bathing: Sitting;Min guard   Lower Body Bathing: Sit to/from stand;Min guard   Upper Body Dressing : Supervision/safety;Sitting   Lower Body Dressing: Min guard;Sit to/from stand   Toilet Transfer: Min guard;RW;Ambulation;Comfort height toilet           Functional mobility during ADLs: Rolling walker;Min guard       Vision Baseline Vision/History: Wears glasses Wears Glasses: Reading only Patient Visual Report: No change from baseline       Perception     Praxis      Pertinent Vitals/Pain Pain Assessment: No/denies pain     Hand Dominance Right   Extremity/Trunk Assessment Upper Extremity Assessment Upper Extremity Assessment: Overall WFL for tasks assessed(pt grossly 4/5 BUE strength.)   Lower Extremity Assessment Lower Extremity Assessment: Overall WFL for tasks assessed;Generalized weakness(Pt able to easily lift BLE off bed. BLE at least 3+/5 strength )  Communication Communication Communication: Expressive difficulties(Pt able to answer with one word (mostly yes/no). Did not express 2 word responces)   Cognition Arousal/Alertness: Awake/alert Behavior During Therapy: WFL for tasks assessed/performed Overall Cognitive Status: Within Functional Limits for tasks  assessed                                     General Comments  Pt very eager to move and be independent. VC required to slow down.     Exercises     Shoulder Instructions      Home Living Family/patient expects to be discharged to:: Private residence Living Arrangements: Children Available Help at Discharge: Family;Available PRN/intermittently Type of Home: House Home Access: Stairs to enter [employer] of Steps: 2 Entrance Stairs-Rails: (one rail) Home Layout: One level     Bathroom Shower/Tub: Teacher, early years/pre: Standard     Home Equipment: Environmental consultant - 2 wheels;Cane - single point;Tub bench          Prior Functioning/Environment Level of Independence: Independent        Comments: Pt independent with all ADLs. Pt acknowledged she cooked and cleaned up to admission. Pt used SPC for community distances.         OT Problem List: Decreased strength;Impaired balance (sitting and/or standing);Decreased safety awareness;Decreased knowledge of use of DME or AE      OT Treatment/Interventions:      OT Goals(Current goals can be found in the care plan section) Acute Rehab OT Goals Patient Stated Goal: to go home OT Goal Formulation: All assessment and education complete, DC therapy Potential to Achieve Goals: Good  OT Frequency:     Barriers to D/C:            Co-evaluation              AM-PAC OT "6 Clicks" Daily Activity     Outcome Measure Help from another person eating meals?: None Help from another person taking care of personal grooming?: A Little Help from another person toileting, which includes using toliet, bedpan, or urinal?: A Little Help from another person bathing (including washing, rinsing, drying)?: A Little Help from another person to put on and taking off regular upper body clothing?: None Help from another person to put on and taking off regular lower body clothing?: A Little 6 Click Score: 20    End of Session Equipment Utilized During Treatment: Gait belt;Rolling walker  Activity Tolerance: Patient tolerated treatment well Patient left: in bed;with call bell/phone within reach;with family/visitor present;with SCD's reapplied(SPL therapist in room)  OT Visit Diagnosis: Other abnormalities of gait and mobility (R26.89)                Time: 0277-4128 OT Time Calculation (min): 30 min Charges:     Jadene Pierini OTS  05/08/2018, 9:53 AM

## 2018-05-09 NOTE — Progress Notes (Signed)
Patient discharged home per MD order. Prescription for walker given. All discharge instructions given and all questions answered.

## 2018-05-09 NOTE — Care Management Note (Signed)
Case Management Note  Patient Details  Name: Yolanda Turner MRN: [id number] Date of Birth: October 11, 1925   Patient discharged home today.  Cory with [facility] notified of discharge. RW was to be delivered to room, however family request for prescription to be provided and they would pick up at the retail store.   Subjective/Objective:                    Action/Plan:   Expected Discharge Date:  05/09/18               Expected Discharge Plan:  [facility]  In-House Referral:     Discharge planning Services  CM Consult  Post Acute Care Choice:  Home Health, Durable Medical Equipment Choice offered to:  Adult Children  DME Arranged:  Walker rolling DME Agency:  [facility] Arranged:  RN, PT, Speech Therapy HH Agency:  [facility]  Status of Service:  Completed, signed off  If discussed at [place] of Stay Meetings, dates discussed:    Additional Comments:  Beverly Sessions, RN 05/09/2018, 4:10 PM

## 2018-05-09 NOTE — Discharge Summary (Signed)
[facility] NAME: Yolanda Turner    MR#:  [id number]  DATE OF BIRTH:  [birth year]  DATE OF ADMISSION:  05/07/2018 ADMITTING PHYSICIAN: Bettey Costa, MD  DATE OF DISCHARGE: 05/09/2018  PRIMARY CARE PHYSICIAN: Albina Billet, MD   ADMISSION DIAGNOSIS:  Slurred speech [R47.81] Cerebral infarction, unspecified mechanism ([place]) [I63.9]  DISCHARGE DIAGNOSIS:  Acute ischemic CVA Aphasia Coronary artery disease Diet-controlled diabetes mellitus Carotid artery stenosis  SECONDARY DIAGNOSIS:   Past Medical History:  Diagnosis Date  . Carotid artery stenosis   . Carotid artery stenosis   . Collagen vascular disease ([place])   . Coronary artery disease   . Diabetes mellitus without complication (HCC)    diet control  . GERD (gastroesophageal reflux disease)   . Hypertension   . Myocardial infarction ([place])   . Osteoporosis   . Peripheral vascular disease ([place])   . Pneumonia   . Renal artery stenosis ([place])   . Stroke ([place])   . Uterine cancer [facility])      ADMITTING HISTORY Yolanda Turner  is a 84 y.o. female with a known history of diabetes and CAD on aspirin and Plavix who presents today due to right facial droop and a aphasia.  Patient has had a history in the past.  Apparently patient woke up with the symptoms.  When she went to bed she had no symptoms.  She has been evaluated by neurology.  She underwent CT the head as well as CTA of the head and neck.  Neurology is recommending admission to the hospital for further work-up including an MRI of the brain and echocardiogram.  Patient continues to have a aphasia and facial droop.  HOSPITAL COURSE:  Patient was admitted to medical floor.  Patient was worked up with CT head, MRI and MRA brain.  MRI brain showed acute CVA.  Patient received aspirin and Plavix during the stay in the hospital along with statin.  MRI brain showed multifocal acute ischemia within the left hemisphere predominantly  involving the left frontal operculum and insula.  No hemorrhage noted during the hospitalization patient was also worked up with CTA head and neck. CTA head and neck Posterior circulation: Right V4 segment calcified plaque but no distal vertebral stenosis. The right vertebral is mildly dominant. Normal PICA origins. Patent vertebrobasilar junction and basilar artery without stenosis. Mild ectatic basilar tip. Normal SCA and PCA origins. Posterior communicating arteries are diminutive or absent. Normal bilateral PCA branches.  Anterior circulation: Both ICA siphons are patent. Moderate to severe calcified siphon plaque bilaterally with mild to moderate left and mild right supraclinoid ICA stenosis. Normal ophthalmic artery origins. Patent carotid termini. Normal MCA and ACA origins. Anterior communicating artery and bilateral ACA branches are within normal limits. Right MCA M1 segment, right MCA bifurcation, and right MCA branches are within normal limits.  The left MCA M1 segment and bifurcation are patent without stenosis. No left MCA M2 branch occlusion is identified, but there is a left M3 branch occlusion in the middle division best seen on series 11, image 23. The other left MCA branches appear within normal limits. Echocardiogram Moderate left ventricular hypertrophy Ejection fraction of 55 to 65%. Neurology consultation was done during the hospitalization.  They recommended to continue aspirin Plavix and anticoagulation with Eliquis after 7 to 10 days to be started by primary care physician after discussion with outpatient neurology.  And was consulted with vascular surgery for carotid artery stenosis.  Patient is status  post left carotid endarterectomy.  They recommended a carotid endarterectomy in approximately 2 to 3 weeks.  Advised antiplatelet management to continue.  Continue aspirin and statin for peripheral arterial disease.  Patient to follow-up with vascular surgery and  neurology as outpatient.  Patient during hospitalization received physical therapy occupational therapy and speech therapy.  Patient will be discharged home with home physical therapy and speech therapy services.  CONSULTS OBTAINED:  Treatment Team:  Catarina Hartshorn, MD Algernon Huxley, MD  DRUG ALLERGIES:   Allergies  Allergen Reactions  . Penicillins Rash    Has patient had a PCN reaction causing immediate rash, facial/tongue/throat swelling, SOB or lightheadedness with hypotension: No Has patient had a PCN reaction causing severe rash involving mucus membranes or skin necrosis: No Has patient had a PCN reaction that required hospitalization: No Has patient had a PCN reaction occurring within the last 10 years: No If all of the above answers are "NO", then may proceed with Cephalosporin use.    DISCHARGE MEDICATIONS:   Allergies as of 05/09/2018      Reactions   Penicillins Rash   Has patient had a PCN reaction causing immediate rash, facial/tongue/throat swelling, SOB or lightheadedness with hypotension: No Has patient had a PCN reaction causing severe rash involving mucus membranes or skin necrosis: No Has patient had a PCN reaction that required hospitalization: No Has patient had a PCN reaction occurring within the last 10 years: No If all of the above answers are "NO", then may proceed with Cephalosporin use.      Medication List    TAKE these medications   aspirin EC 81 MG tablet Take 81 mg by mouth daily.   clopidogrel 75 MG tablet Commonly known as:  PLAVIX Take 75 mg by mouth at bedtime.   gabapentin 300 MG capsule Commonly known as:  NEURONTIN Take 300 mg by mouth at bedtime.   lisinopril 10 MG tablet Commonly known as:  PRINIVIL,ZESTRIL Take 10 mg by mouth at bedtime.   metoCLOPramide 10 MG tablet Commonly known as:  REGLAN Take 1 tablet (10 mg total) every 8 (eight) hours as needed by mouth. What changed:  reasons to take this   metoprolol succinate  50 MG 24 hr tablet Commonly known as:  TOPROL-XL Take 25 mg by mouth at bedtime.   simvastatin 40 MG tablet Commonly known as:  ZOCOR Take 40 mg by mouth at bedtime.            Durable Medical Equipment  (From admission, onward)         Start     Ordered   05/09/18 1013  For home use only DME Walker rolling  Once    Question:  Patient needs a walker to treat with the following condition  Answer:  Weakness   05/09/18 1013          Today  Patient seen and evaluated today Tolerating diet well Received physical therapy and speech therapy  VITAL SIGNS:  Blood pressure (!) 187/74, pulse 73, temperature 98.2 F (36.8 C), temperature source Oral, resp. rate 18, height 5' (1.524 m), weight 32.9 kg, SpO2 97 %.  I/O:    Intake/Output Summary (Last 24 hours) at 05/09/2018 1037 Last data filed at 05/09/2018 0124 Gross per 24 hour  Intake 465.61 ml  Output -  Net 465.61 ml    PHYSICAL EXAMINATION:  Physical Exam  GENERAL:  84 y.o.-year-old patient lying in the bed with no acute distress.  LUNGS: Normal breath sounds  bilaterally, no wheezing, rales,rhonchi or crepitation. No use of accessory muscles of respiration.  CARDIOVASCULAR: S1, S2 normal. No murmurs, rubs, or gallops.  ABDOMEN: Soft, non-tender, non-distended. Bowel sounds present. No organomegaly or mass.  NEUROLOGIC: Moves all 4 extremities. Has aphasia PSYCHIATRIC: The patient is alert and oriented x 3.  SKIN: No obvious rash, lesion, or ulcer.   DATA REVIEW:   CBC Recent Labs  Lab 05/07/18 1038  WBC 4.6  HGB 12.9  HCT 40.4  PLT 170    Chemistries  Recent Labs  Lab 05/07/18 1038  NA 143  K 5.0  CL 106  CO2 29  GLUCOSE 128*  BUN 19  CREATININE 1.00  CALCIUM 9.6  AST 23  ALT 15  ALKPHOS 50  BILITOT 0.7    Cardiac Enzymes Recent Labs  Lab 05/07/18 1038  TROPONINI <0.03    Microbiology Results  No results found for this or any previous visit.  RADIOLOGY:  Ct Angio Head W Or  Wo Contrast  Result Date: 05/07/2018 CLINICAL DATA:  84 year old female code stroke with right facial droop and aphasia. EXAM: CT ANGIOGRAPHY HEAD AND NECK TECHNIQUE: Multidetector CT imaging of the head and neck was performed using the standard protocol during bolus administration of intravenous contrast. Multiplanar CT image reconstructions and MIPs were obtained to evaluate the vascular anatomy. Carotid stenosis measurements (when applicable) are obtained utilizing NASCET criteria, using the distal internal carotid diameter as the denominator. CONTRAST:  91mL OMNIPAQUE IOHEXOL 350 MG/ML SOLN COMPARISON:  Head CT without contrast 1033 hours today. FINDINGS: CTA NECK Skeleton: Nearly absent dentition. Degenerative changes throughout the cervical spine. Osteopenia. Prior sternotomy. No acute osseous abnormality identified. Upper chest: Confluent apical lung scarring with mild bronchiectasis. No superior mediastinal lymphadenopathy. Other neck: Negative. Aortic arch: Calcified aortic atherosclerosis. 3 vessel arch configuration. Right carotid system: No brachiocephalic or right CCA origin stenosis despite plaque. Bulky calcified plaque at the right ICA origin and bulb resulting in high-grade short-segment stenosis at the distal bulb approaching a radiographic string sign (series 4, image 65 and series 8, image 47. The right ICA remains patent to the skull base with mild tortuosity but no additional stenosis. Left carotid system: No left CCA stenosis despite plaque. Little plaque at the left carotid bifurcation and no cervical left ICA stenosis. Vertebral arteries: Vertebral no proximal right subclavian artery origin artery or stenosis despite right plaque. Patent and mildly dolichoectatic right vertebral artery to the skull base. Bulky proximal left subclavian artery plaque with stenosis estimated at 60 % with respect to the distal vessel. Soft and calcified plaque at the left vertebral artery origin with mild to  moderate stenosis. No additional left vertebral artery stenosis to the skull base. CTA HEAD Posterior circulation: Right V4 segment calcified plaque but no distal vertebral stenosis. The right vertebral is mildly dominant. Normal PICA origins. Patent vertebrobasilar junction and basilar artery without stenosis. Mild ectatic basilar tip. Normal SCA and PCA origins. Posterior communicating arteries are diminutive or absent. Normal bilateral PCA branches. Anterior circulation: Both ICA siphons are patent. Moderate to severe calcified siphon plaque bilaterally with mild to moderate left and mild right supraclinoid ICA stenosis. Normal ophthalmic artery origins. Patent carotid termini. Normal MCA and ACA origins. Anterior communicating artery and bilateral ACA branches are within normal limits. Right MCA M1 segment, right MCA bifurcation, and right MCA branches are within normal limits. The left MCA M1 segment and bifurcation are patent without stenosis. No left MCA M2 branch occlusion is identified, but there is  a left M3 branch occlusion in the middle division best seen on series 11, image 23. The other left MCA branches appear within normal limits. Venous sinuses: Patent. Anatomic variants: Mildly dominant right vertebral artery. Delayed phase: There are some cytotoxic edema changes in the left operculum suspected on series 12, image 14 but these are subtle due to being superimposed on chronic posterior left MCA encephalomalacia. No abnormal enhancement identified. Review of the MIP images confirms the above findings IMPRESSION: 1. Negative for LVO but positive for Left MCA M3 branch occlusion in the middle division. This was discussed by telephone with Dr. Charlotte Crumb on 05/07/2018 at 1145 hours. 2. Positive also for atherosclerotic plaque with significant stenoses: - Right ICA origin high-grade stenosis at the distal bulb approaching a RADIOGRAPHIC STRING SIGN. - Moderate Left supraclinoid ICA stenosis. - Left  Subclavian Artery origin 60% stenosis. 3. Mild cytotoxic edema suspected at the left operculum. No hemorrhage or mass effect. 4.  Aortic Atherosclerosis (ICD10-I70.0). Electronically Signed   By: Genevie Ann M.D.   On: 05/07/2018 11:52   Ct Angio Neck W And/or Wo Contrast  Result Date: 05/07/2018 CLINICAL DATA:  84 year old female code stroke with right facial droop and aphasia. EXAM: CT ANGIOGRAPHY HEAD AND NECK TECHNIQUE: Multidetector CT imaging of the head and neck was performed using the standard protocol during bolus administration of intravenous contrast. Multiplanar CT image reconstructions and MIPs were obtained to evaluate the vascular anatomy. Carotid stenosis measurements (when applicable) are obtained utilizing NASCET criteria, using the distal internal carotid diameter as the denominator. CONTRAST:  23mL OMNIPAQUE IOHEXOL 350 MG/ML SOLN COMPARISON:  Head CT without contrast 1033 hours today. FINDINGS: CTA NECK Skeleton: Nearly absent dentition. Degenerative changes throughout the cervical spine. Osteopenia. Prior sternotomy. No acute osseous abnormality identified. Upper chest: Confluent apical lung scarring with mild bronchiectasis. No superior mediastinal lymphadenopathy. Other neck: Negative. Aortic arch: Calcified aortic atherosclerosis. 3 vessel arch configuration. Right carotid system: No brachiocephalic or right CCA origin stenosis despite plaque. Bulky calcified plaque at the right ICA origin and bulb resulting in high-grade short-segment stenosis at the distal bulb approaching a radiographic string sign (series 4, image 65 and series 8, image 47. The right ICA remains patent to the skull base with mild tortuosity but no additional stenosis. Left carotid system: No left CCA stenosis despite plaque. Little plaque at the left carotid bifurcation and no cervical left ICA stenosis. Vertebral arteries: Vertebral no proximal right subclavian artery origin artery or stenosis despite right plaque.  Patent and mildly dolichoectatic right vertebral artery to the skull base. Bulky proximal left subclavian artery plaque with stenosis estimated at 60 % with respect to the distal vessel. Soft and calcified plaque at the left vertebral artery origin with mild to moderate stenosis. No additional left vertebral artery stenosis to the skull base. CTA HEAD Posterior circulation: Right V4 segment calcified plaque but no distal vertebral stenosis. The right vertebral is mildly dominant. Normal PICA origins. Patent vertebrobasilar junction and basilar artery without stenosis. Mild ectatic basilar tip. Normal SCA and PCA origins. Posterior communicating arteries are diminutive or absent. Normal bilateral PCA branches. Anterior circulation: Both ICA siphons are patent. Moderate to severe calcified siphon plaque bilaterally with mild to moderate left and mild right supraclinoid ICA stenosis. Normal ophthalmic artery origins. Patent carotid termini. Normal MCA and ACA origins. Anterior communicating artery and bilateral ACA branches are within normal limits. Right MCA M1 segment, right MCA bifurcation, and right MCA branches are within normal limits. The left MCA  M1 segment and bifurcation are patent without stenosis. No left MCA M2 branch occlusion is identified, but there is a left M3 branch occlusion in the middle division best seen on series 11, image 23. The other left MCA branches appear within normal limits. Venous sinuses: Patent. Anatomic variants: Mildly dominant right vertebral artery. Delayed phase: There are some cytotoxic edema changes in the left operculum suspected on series 12, image 14 but these are subtle due to being superimposed on chronic posterior left MCA encephalomalacia. No abnormal enhancement identified. Review of the MIP images confirms the above findings IMPRESSION: 1. Negative for LVO but positive for Left MCA M3 branch occlusion in the middle division. This was discussed by telephone with Dr.  Charlotte Crumb on 05/07/2018 at 1145 hours. 2. Positive also for atherosclerotic plaque with significant stenoses: - Right ICA origin high-grade stenosis at the distal bulb approaching a RADIOGRAPHIC STRING SIGN. - Moderate Left supraclinoid ICA stenosis. - Left Subclavian Artery origin 60% stenosis. 3. Mild cytotoxic edema suspected at the left operculum. No hemorrhage or mass effect. 4.  Aortic Atherosclerosis (ICD10-I70.0). Electronically Signed   By: Genevie Ann M.D.   On: 05/07/2018 11:52   Mr Brain Wo Contrast  Result Date: 05/08/2018 CLINICAL DATA:  Aphasia and right facial droop. EXAM: MRI HEAD WITHOUT CONTRAST TECHNIQUE: Multiplanar, multiecho pulse sequences of the brain and surrounding structures were obtained without intravenous contrast. COMPARISON:  CTA head neck 05/07/2018 FINDINGS: BRAIN: There is acute cortical ischemia within the left frontal lobe, predominantly involving the operculum and insula. There is a small area of acute ischemia within the posterior left parietal lobe. The midline structures are normal. No midline shift or other mass effect. There are old infarcts of the right frontal, left parietal and left occipital lobes. Diffuse confluent hyperintense T2-weighted signal within the periventricular, deep and juxtacortical white matter, most commonly due to chronic ischemic microangiopathy. Generalized atrophy without lobar predilection. Susceptibility-sensitive sequences show no chronic microhemorrhage or superficial siderosis. VASCULAR: Major intracranial arterial and venous sinus flow voids are normal. SKULL AND UPPER CERVICAL SPINE: Calvarial bone marrow signal is normal. There is no skull base mass. Visualized upper cervical spine and soft tissues are normal. SINUSES/ORBITS: No fluid levels or advanced mucosal thickening. No mastoid or middle ear effusion. The orbits are normal. IMPRESSION: 1. Multifocal acute ischemia within the left hemisphere, predominantly involving the left  frontal operculum and insula. 2. No hemorrhage or mass effect. 3. Multiple old infarcts and sequelae of chronic ischemic microangiopathy. Electronically Signed   By: Ulyses Jarred M.D.   On: 05/08/2018 03:45    Follow up with PCP in 1 week.  Management plans discussed with the patient, family and they are in agreement.  CODE STATUS: Full code    Code Status Orders  (From admission, onward)         Start     Ordered   05/07/18 1350  Full code  Continuous     05/07/18 1349        Code Status History    Date Active Date Inactive Code Status Order ID Comments User Context   04/04/2017 1431 04/04/2017 2137 DNR [id number]  Asencion Gowda, NP Inpatient   04/02/2017 1837 04/04/2017 1430 Full Code [id number]  Vaughan Basta, MD Inpatient   01/22/2015 1036 01/22/2015 1522 Full Code [id number]  Schnier, Dolores Lory, MD Inpatient    Advance Directive Documentation     Most Recent Value  Type of Advance Directive  Healthcare Power of Attorney  Pre-existing out  of facility DNR order (yellow form or pink MOST form)  -  "MOST" Form in Place?  -      TOTAL TIME TAKING CARE OF THIS PATIENT ON DAY OF DISCHARGE: more than 35 minutes.   Saundra Shelling M.D on 05/09/2018 at 10:37 AM  Between 7am to 6pm - Pager - [phone]  After 6pm go to [email or web address] - password [email or web address] [facility]  Office  [phone]  CC: Primary care physician; Albina Billet, MD  Note: This dictation was prepared with Dragon dictation along with smaller phrase technology. Any transcriptional errors that result from this process are unintentional.

## 2018-10-16 ENCOUNTER — Other Ambulatory Visit: Payer: Self-pay

## 2018-10-16 ENCOUNTER — Inpatient Hospital Stay
Admission: EM | Admit: 2018-10-16 | Discharge: 2018-10-19 | DRG: 309 | Disposition: A | Discharge status: Home Health | Payer: Medicare Other | Attending: Specialist | Admitting: Specialist

## 2018-10-16 DIAGNOSIS — E1142 Type 2 diabetes mellitus with diabetic polyneuropathy: Secondary | ICD-10-CM | POA: Diagnosis present

## 2018-10-16 DIAGNOSIS — Z90710 Acquired absence of both cervix and uterus: Secondary | ICD-10-CM

## 2018-10-16 DIAGNOSIS — I480 Paroxysmal atrial fibrillation: Secondary | ICD-10-CM | POA: Diagnosis not present

## 2018-10-16 DIAGNOSIS — Z7982 Long term (current) use of aspirin: Secondary | ICD-10-CM

## 2018-10-16 DIAGNOSIS — Z951 Presence of aortocoronary bypass graft: Secondary | ICD-10-CM

## 2018-10-16 DIAGNOSIS — I252 Old myocardial infarction: Secondary | ICD-10-CM

## 2018-10-16 DIAGNOSIS — R4701 Aphasia: Secondary | ICD-10-CM | POA: Diagnosis present

## 2018-10-16 DIAGNOSIS — Z7189 Other specified counseling: Secondary | ICD-10-CM

## 2018-10-16 DIAGNOSIS — Z7902 Long term (current) use of antithrombotics/antiplatelets: Secondary | ICD-10-CM

## 2018-10-16 DIAGNOSIS — Z79899 Other long term (current) drug therapy: Secondary | ICD-10-CM

## 2018-10-16 DIAGNOSIS — I10 Essential (primary) hypertension: Secondary | ICD-10-CM | POA: Diagnosis present

## 2018-10-16 DIAGNOSIS — E1151 Type 2 diabetes mellitus with diabetic peripheral angiopathy without gangrene: Secondary | ICD-10-CM | POA: Diagnosis present

## 2018-10-16 DIAGNOSIS — Z66 Do not resuscitate: Secondary | ICD-10-CM | POA: Diagnosis present

## 2018-10-16 DIAGNOSIS — I959 Hypotension, unspecified: Secondary | ICD-10-CM | POA: Diagnosis present

## 2018-10-16 DIAGNOSIS — I4891 Unspecified atrial fibrillation: Secondary | ICD-10-CM | POA: Diagnosis present

## 2018-10-16 DIAGNOSIS — I248 Other forms of acute ischemic heart disease: Secondary | ICD-10-CM | POA: Diagnosis present

## 2018-10-16 DIAGNOSIS — Z87891 Personal history of nicotine dependence: Secondary | ICD-10-CM

## 2018-10-16 DIAGNOSIS — M545 Low back pain, unspecified: Secondary | ICD-10-CM

## 2018-10-16 DIAGNOSIS — E785 Hyperlipidemia, unspecified: Secondary | ICD-10-CM | POA: Diagnosis present

## 2018-10-16 DIAGNOSIS — Z1159 Encounter for screening for other viral diseases: Secondary | ICD-10-CM

## 2018-10-16 DIAGNOSIS — K219 Gastro-esophageal reflux disease without esophagitis: Secondary | ICD-10-CM | POA: Diagnosis present

## 2018-10-16 DIAGNOSIS — Z515 Encounter for palliative care: Secondary | ICD-10-CM

## 2018-10-16 DIAGNOSIS — E119 Type 2 diabetes mellitus without complications: Secondary | ICD-10-CM

## 2018-10-16 DIAGNOSIS — I2510 Atherosclerotic heart disease of native coronary artery without angina pectoris: Secondary | ICD-10-CM | POA: Diagnosis present

## 2018-10-16 DIAGNOSIS — M810 Age-related osteoporosis without current pathological fracture: Secondary | ICD-10-CM | POA: Diagnosis present

## 2018-10-16 DIAGNOSIS — Z8542 Personal history of malignant neoplasm of other parts of uterus: Secondary | ICD-10-CM

## 2018-10-16 DIAGNOSIS — Z8673 Personal history of transient ischemic attack (TIA), and cerebral infarction without residual deficits: Secondary | ICD-10-CM

## 2018-10-16 HISTORY — DX: Presence of aortocoronary bypass graft: Z95.1

## 2018-10-16 LAB — CBC WITH DIFFERENTIAL/PLATELET
Abs Immature Granulocytes: 0.04 10*3/uL (ref 0.00–0.07)
Basophils Absolute: 0 10*3/uL (ref 0.0–0.1)
Basophils Relative: 0 %
Eosinophils Absolute: 0 10*3/uL (ref 0.0–0.5)
Eosinophils Relative: 0 %
HCT: 43.6 % (ref 36.0–46.0)
Hemoglobin: 14.3 g/dL (ref 12.0–15.0)
Immature Granulocytes: 0 %
Lymphocytes Relative: 10 %
Lymphs Abs: 1.1 10*3/uL (ref 0.7–4.0)
MCH: 28.8 pg (ref 26.0–34.0)
MCHC: 32.8 g/dL (ref 30.0–36.0)
MCV: 87.9 fL (ref 80.0–100.0)
Monocytes Absolute: 1.1 10*3/uL — ABNORMAL HIGH (ref 0.1–1.0)
Monocytes Relative: 10 %
Neutro Abs: 8.3 10*3/uL — ABNORMAL HIGH (ref 1.7–7.7)
Neutrophils Relative %: 80 %
Platelets: 316 10*3/uL (ref 150–400)
RBC: 4.96 MIL/uL (ref 3.87–5.11)
RDW: 12.5 % (ref 11.5–15.5)
WBC: 10.5 10*3/uL (ref 4.0–10.5)
nRBC: 0 % (ref 0.0–0.2)

## 2018-10-16 LAB — BASIC METABOLIC PANEL
Anion gap: 15 (ref 5–15)
BUN: 50 mg/dL — ABNORMAL HIGH (ref 8–23)
CO2: 30 mmol/L (ref 22–32)
Calcium: 10.1 mg/dL (ref 8.9–10.3)
Chloride: 93 mmol/L — ABNORMAL LOW (ref 98–111)
Creatinine, Ser: 1.04 mg/dL — ABNORMAL HIGH (ref 0.44–1.00)
GFR calc Af Amer: 55 mL/min — ABNORMAL LOW (ref >60)
GFR calc non Af Amer: 47 mL/min — ABNORMAL LOW (ref >60)
Glucose, Bld: 173 mg/dL — ABNORMAL HIGH (ref 70–99)
Potassium: 4.2 mmol/L (ref 3.5–5.1)
Sodium: 138 mmol/L (ref 135–145)

## 2018-10-16 LAB — SARS CORONAVIRUS 2 BY RT PCR (HOSPITAL ORDER, PERFORMED IN ~~LOC~~ HOSPITAL LAB): SARS Coronavirus 2: NEGATIVE

## 2018-10-16 LAB — TROPONIN I: Troponin I: 0.09 ng/mL (ref <0.03)

## 2018-10-16 MED ORDER — ASPIRIN 81 MG PO CHEW
324.0000 mg | CHEWABLE_TABLET | Freq: Once | ORAL | Status: DC
  Filled 2018-10-16: qty 4

## 2018-10-16 MED ORDER — DILTIAZEM HCL 25 MG/5ML IV SOLN
10.0000 mg | Freq: Once | INTRAVENOUS | Status: AC
  Administered 2018-10-16: 10 mg via INTRAVENOUS
  Filled 2018-10-16: qty 5

## 2018-10-16 MED ORDER — ACETAMINOPHEN 650 MG RE SUPP: 650.0000 mg | Freq: Four times a day (QID) | RECTAL | Status: DC | PRN

## 2018-10-16 MED ORDER — ACETAMINOPHEN 325 MG PO TABS: 650.0000 mg | ORAL_TABLET | Freq: Four times a day (QID) | ORAL | Status: DC | PRN

## 2018-10-16 MED ORDER — SODIUM CHLORIDE 0.9 % IV BOLUS
1000.0000 mL | Freq: Once | INTRAVENOUS | Status: AC
  Administered 2018-10-16: 1000 mL via INTRAVENOUS

## 2018-10-16 MED ORDER — INSULIN ASPART 100 UNIT/ML ~~LOC~~ SOLN
0.0000 [IU] | Freq: Three times a day (TID) | SUBCUTANEOUS | Status: DC
  Administered 2018-10-17 (×2): 1 [IU] via SUBCUTANEOUS
  Administered 2018-10-18: 2 [IU] via SUBCUTANEOUS
  Administered 2018-10-18 (×2): 1 [IU] via SUBCUTANEOUS
  Administered 2018-10-19: 2 [IU] via SUBCUTANEOUS
  Filled 2018-10-16 (×5): qty 1

## 2018-10-16 MED ORDER — ASPIRIN EC 81 MG PO TBEC
81.0000 mg | DELAYED_RELEASE_TABLET | Freq: Every day | ORAL | Status: DC
  Administered 2018-10-17 – 2018-10-19 (×3): 81 mg via ORAL
  Filled 2018-10-16 (×3): qty 1

## 2018-10-16 MED ORDER — ONDANSETRON HCL 4 MG/2ML IJ SOLN: 4.0000 mg | Freq: Four times a day (QID) | INTRAMUSCULAR | Status: DC | PRN

## 2018-10-16 MED ORDER — ENOXAPARIN SODIUM 40 MG/0.4ML ~~LOC~~ SOLN: 40.0000 mg | SUBCUTANEOUS | Status: DC

## 2018-10-16 MED ORDER — ONDANSETRON HCL 4 MG PO TABS: 4.0000 mg | ORAL_TABLET | Freq: Four times a day (QID) | ORAL | Status: DC | PRN

## 2018-10-16 MED ORDER — INSULIN ASPART 100 UNIT/ML ~~LOC~~ SOLN
0.0000 [IU] | Freq: Every day | SUBCUTANEOUS | Status: DC
  Filled 2018-10-16: qty 1

## 2018-10-16 MED ORDER — DILTIAZEM HCL 25 MG/5ML IV SOLN
INTRAVENOUS | Status: AC
  Administered 2018-10-16: 10 mg via INTRAVENOUS
  Filled 2018-10-16: qty 5

## 2018-10-16 MED ORDER — CLOPIDOGREL BISULFATE 75 MG PO TABS
75.0000 mg | ORAL_TABLET | Freq: Every day | ORAL | Status: DC
  Administered 2018-10-17 – 2018-10-18 (×2): 75 mg via ORAL
  Filled 2018-10-16 (×2): qty 1

## 2018-10-16 NOTE — ED Triage Notes (Signed)
Patient was brought in for back pain since Thursday. Patient has a history of sciatica. Family is unable to control pain at home.

## 2018-10-16 NOTE — ED Notes (Signed)
2nd liter of NS started at this time.

## 2018-10-16 NOTE — ED Notes (Signed)
Pt is more alert than when she arrived. Pt stated that she felt better.

## 2018-10-16 NOTE — ED Notes (Signed)
Dr. Archie Balboa made aware of pts trop level of 0.09.

## 2018-10-16 NOTE — ED Notes (Signed)
Pt was given small sip of water to access swallowing. Pt had a hard time. Also, pts BP is 98/44 and cardizem was not admin. Dr. Archie Balboa made ware, stated to started 2nd liter of NS.

## 2018-10-16 NOTE — ED Provider Notes (Signed)
[facility] Emergency Department Provider Note   ____________________________________________   I have reviewed the triage vital signs and the nursing notes.   HISTORY  Chief Complaint Back Pain   History limited by and level 5 caveat due to: AMS   HPI Yolanda Turner is a 85 y.o. female who presents to the emergency department today via EMS for apparent back pain. The patient herself indicates that her whole left side is hurting her. The patient cannot tell how long the pain has been present. The patient denies any chest pain or shortness of breath.   Records reviewed. Per medical record review patient has a history of CAD, HTN, peripheral vascular disease.   Past Medical History:  Diagnosis Date  . Carotid artery stenosis   . Carotid artery stenosis   . Collagen vascular disease ([place])   . Coronary artery disease   . Diabetes mellitus without complication (HCC)    diet control  . GERD (gastroesophageal reflux disease)   . Hypertension   . Myocardial infarction ([place])   . Osteoporosis   . Peripheral vascular disease ([place])   . Pneumonia   . Renal artery stenosis ([place])   . Status post double vessel coronary artery bypass 2005  . Stroke ([place])   . Uterine cancer [facility])     Patient Active Problem List   Diagnosis Date Noted  . CVA (cerebral vascular accident) ([place]) 05/07/2018  . Hypertension 02/02/2018  . Diabetes mellitus without complication ([place]) 55/73/2202  . Carotid artery stenosis 02/02/2018  . Acute CVA (cerebrovascular accident) ([place]) 04/04/2017  . Speech problem 04/02/2017  . Confusion 04/02/2017  . TIA (transient ischemic attack) 04/02/2017    Past Surgical History:  Procedure Laterality Date  . ABDOMINAL HYSTERECTOMY    . CAROTID ENDARTERECTOMY    . CATARACT EXTRACTION W/PHACO Right 09/28/2015   Procedure: CATARACT EXTRACTION PHACO AND INTRAOCULAR LENS PLACEMENT (IOC);  Surgeon: Estill Cotta, MD;  Location: [facility] ORS;  Service:  Ophthalmology;  Laterality: Right;  [place] 041:18 AP% 28.6 CDE 114.94 fluid pack lot # [id number]  . CORONARY ARTERY BYPASS GRAFT    . PERIPHERAL VASCULAR CATHETERIZATION Right 01/22/2015   Procedure: Carotid PTA/Stent Intervention;  Surgeon: Katha Cabal, MD;  Location: [facility];  Service: Cardiovascular;  Laterality: Right;  . uterine cancer surgery      Prior to Admission medications   Medication Sig Start Date End Date Taking? Authorizing Provider  aspirin EC 81 MG tablet Take 81 mg by mouth daily.    [provider]  clopidogrel (PLAVIX) 75 MG tablet Take 75 mg by mouth at bedtime.    [provider]  gabapentin (NEURONTIN) 300 MG capsule Take 300 mg by mouth at bedtime.    [provider]  lisinopril (PRINIVIL,ZESTRIL) 10 MG tablet Take 10 mg by mouth at bedtime.     [provider]  metoCLOPramide (REGLAN) 10 MG tablet Take 1 tablet (10 mg total) every 8 (eight) hours as needed by mouth. Patient taking differently: Take 10 mg by mouth every 8 (eight) hours as needed for nausea or vomiting.  05/02/17   Loney Hering, MD  metoprolol succinate (TOPROL-XL) 50 MG 24 hr tablet Take 25 mg by mouth at bedtime.     [provider]  simvastatin (ZOCOR) 40 MG tablet Take 40 mg by mouth at bedtime.     [provider]    Allergies Penicillins  Family History  Problem Relation Age of Onset  . Lung  cancer Daughter     Social History Social History   Tobacco Use  . Smoking status: Former Research scientist (life sciences)  . Smokeless tobacco: Never Used  Substance Use Topics  . Alcohol use: No  . Drug use: No    Review of Systems limited secondary to AMS Constitutional: No fever Cardiovascular: Denies chest pain. Respiratory: Denies shortness of breath. Gastrointestinal: No abdominal pain.   Genitourinary: Negative for dysuria. Musculoskeletal: Positive for left leg pain.  Skin: Negative for rash. Neurological: Negative for headaches,  focal weakness or numbness.  ____________________________________________   PHYSICAL EXAM:  VITAL SIGNS: ED Triage Vitals [10/16/18 2008]  Enc Vitals Group     BP 104/66     Pulse Rate 79     Resp 19     Temp 98.3 F (36.8 C)     Temp Source Oral     SpO2 98 %   Constitutional: Awake and alert. Not oriented to events or place.  Eyes: Conjunctivae are normal.  ENT      Head: Normocephalic and atraumatic.      Nose: No congestion/rhinnorhea.      Mouth/Throat: Mucous membranes are moist.      Neck: No stridor. Hematological/Lymphatic/Immunilogical: No cervical lymphadenopathy. Cardiovascular: Tachycardic, irregularly irregular.  Respiratory: Normal respiratory effort without tachypnea nor retractions. Breath sounds are clear and equal bilaterally. No wheezes/rales/rhonchi. Gastrointestinal: Soft and non tender. No rebound. No guarding.  Genitourinary: Deferred Musculoskeletal: Normal range of motion in all extremities. No lower extremity edema. Neurologic:  Awake and alert. Not completely oriented. Appears to be moving all extremities.   Skin:  Skin is warm, dry and intact. No rash noted.  ____________________________________________    LABS (pertinent positives/negatives)  Trop 0.09 BMP na 138, k 4.2, glu 173, cr 1.04 CBC wbc 10.5, hgb 14.3, plt 316 UA clear, ketones 5, 0-5 wbc and rbc Vocid negative ____________________________________________   EKG  I, Nance Pear, attending physician, personally viewed and interpreted this EKG  EKG Time: 2003 Rate: 164 Rhythm: atrial fibrillation Axis: left axis Intervals: qtc 501 QRS: LBBB ST changes: st elevation in v3 Impression: abnormal ekg  ____________________________________________    RADIOLOGY  None  ____________________________________________   PROCEDURES  Procedures  ____________________________________________   INITIAL IMPRESSION / ASSESSMENT AND PLAN / ED COURSE  Pertinent labs &  imaging results that were available during my care of the patient were reviewed by me and considered in my medical decision making (see chart for details).   Patient brought in to the emergency department because of concern for back pain. The patient states that her whole left side is hurting. The patient does apparently have history of sciatica. Patient was noted to be in afib with RVR. She was given two doses of cardizem here in the emergency department. Will plan on admission.  ________________________________________   FINAL CLINICAL IMPRESSION(S) / ED DIAGNOSES  Final diagnoses:  Low back pain, unspecified back pain laterality, unspecified chronicity, unspecified whether sciatica present  Atrial fibrillation with RVR ([place])     Note: This dictation was prepared with Dragon dictation. Any transcriptional errors that result from this process are unintentional     Nance Pear, MD 10/17/18 1719

## 2018-10-16 NOTE — ED Notes (Signed)
Per the patient's daughter [name] [phone], patient at baseline is A&OX4. Patient's daughter states that the patient was getting up and preparing breakfast every morning until 10/11/2018. Patient has not had an appetite since then. Patient also does her own management at home. Patient's family does not believe that she is taking any pain medicine other than gabapentin.

## 2018-10-16 NOTE — H&P (Signed)
[facility] [place] NAME: Yolanda Turner    MR#:  [id number]  DATE OF BIRTH:  Mar 30, 1926  DATE OF ADMISSION:  10/16/2018  PRIMARY CARE PHYSICIAN: Albina Billet, MD   REQUESTING/REFERRING PHYSICIAN: Archie Balboa, MD  CHIEF COMPLAINT:   Chief Complaint  Patient presents with  . Back Pain    HISTORY OF PRESENT ILLNESS:  Yolanda Turner  is a 85 y.o. female who presents with chief complaint as above.  Patient presents the ED with a complaint of pain in her left chest and back.  She is found to be in A. fib with RVR.  She was given diltiazem in the ED with moderate control of her heart rate.  Patient's pain improved with improvement of her heart rate.  However, she does have an elevated troponin at 0.09.  Hospitalist were called for admission  PAST MEDICAL HISTORY:   Past Medical History:  Diagnosis Date  . Carotid artery stenosis   . Carotid artery stenosis   . Collagen vascular disease ([place])   . Coronary artery disease   . Diabetes mellitus without complication (HCC)    diet control  . GERD (gastroesophageal reflux disease)   . Hypertension   . Myocardial infarction ([place])   . Osteoporosis   . Peripheral vascular disease ([place])   . Pneumonia   . Renal artery stenosis ([place])   . Status post double vessel coronary artery bypass 2005  . Stroke ([place])   . Uterine cancer ([place])      PAST SURGICAL HISTORY:   Past Surgical History:  Procedure Laterality Date  . ABDOMINAL HYSTERECTOMY    . CAROTID ENDARTERECTOMY    . CATARACT EXTRACTION W/PHACO Right 09/28/2015   Procedure: CATARACT EXTRACTION PHACO AND INTRAOCULAR LENS PLACEMENT (IOC);  Surgeon: Estill Cotta, MD;  Location: [facility] ORS;  Service: Ophthalmology;  Laterality: Right;  [place] 041:18 AP% 28.6 CDE 114.94 fluid pack lot # [id number]  . CORONARY ARTERY BYPASS GRAFT    . PERIPHERAL VASCULAR CATHETERIZATION Right 01/22/2015   Procedure: Carotid PTA/Stent Intervention;  Surgeon:  Katha Cabal, MD;  Location: [facility];  Service: Cardiovascular;  Laterality: Right;  . uterine cancer surgery       SOCIAL HISTORY:   Social History   Tobacco Use  . Smoking status: Former Research scientist (life sciences)  . Smokeless tobacco: Never Used  Substance Use Topics  . Alcohol use: No     FAMILY HISTORY:   Family History  Problem Relation Age of Onset  . Lung cancer Daughter      DRUG ALLERGIES:   Allergies  Allergen Reactions  . Penicillins Rash    Has patient had a PCN reaction causing immediate rash, facial/tongue/throat swelling, SOB or lightheadedness with hypotension: No Has patient had a PCN reaction causing severe rash involving mucus membranes or skin necrosis: No Has patient had a PCN reaction that required hospitalization: No Has patient had a PCN reaction occurring within the last 10 years: No If all of the above answers are "NO", then may proceed with Cephalosporin use.    MEDICATIONS AT HOME:   Prior to Admission medications   Medication Sig Start Date End Date Taking? Authorizing Provider  aspirin EC 81 MG tablet Take 81 mg by mouth daily.    [provider]  clopidogrel (PLAVIX) 75 MG tablet Take 75 mg by mouth at bedtime.    [provider]  gabapentin (NEURONTIN) 300 MG capsule Take 300 mg by mouth at  bedtime.    [provider]  lisinopril (PRINIVIL,ZESTRIL) 10 MG tablet Take 10 mg by mouth at bedtime.     [provider]  metoCLOPramide (REGLAN) 10 MG tablet Take 1 tablet (10 mg total) every 8 (eight) hours as needed by mouth. Patient taking differently: Take 10 mg by mouth every 8 (eight) hours as needed for nausea or vomiting.  05/02/17   Loney Hering, MD  metoprolol succinate (TOPROL-XL) 50 MG 24 hr tablet Take 25 mg by mouth at bedtime.     [provider]  simvastatin (ZOCOR) 40 MG tablet Take 40 mg by mouth at bedtime.     [provider]    REVIEW OF SYSTEMS:  Review of Systems   Constitutional: Negative for chills, fever, malaise/fatigue and weight loss.  HENT: Negative for ear pain, hearing loss and tinnitus.   Eyes: Negative for blurred vision, double vision, pain and redness.  Respiratory: Negative for cough, hemoptysis and shortness of breath.   Cardiovascular: Positive for palpitations. Negative for chest pain, orthopnea and leg swelling.  Gastrointestinal: Negative for abdominal pain, constipation, diarrhea, nausea and vomiting.  Genitourinary: Negative for dysuria, frequency and hematuria.  Musculoskeletal: Negative for back pain, joint pain and neck pain.  Skin:       No acne, rash, or lesions  Neurological: Negative for dizziness, tremors, focal weakness and weakness.  Endo/Heme/Allergies: Negative for polydipsia. Does not bruise/bleed easily.  Psychiatric/Behavioral: Negative for depression. The patient is not nervous/anxious and does not have insomnia.      VITAL SIGNS:   Vitals:   10/16/18 2157 10/16/18 2159 10/16/18 2210 10/16/18 2213  BP: (!) 121/101  (!) 98/44   Pulse:   (!) 137   Resp: (!) 22  (!) 23 (!) 22  Temp:      TempSrc:      SpO2:  100% 96%    Wt Readings from Last 3 Encounters:  05/07/18 32.9 kg  03/23/18 33.4 kg  02/02/18 33.5 kg    PHYSICAL EXAMINATION:  Physical Exam  Vitals reviewed. Constitutional: She is oriented to person, place, and time. She appears well-developed and well-nourished. No distress.  HENT:  Head: Normocephalic and atraumatic.  Mouth/Throat: Oropharynx is clear and moist.  Eyes: Pupils are equal, round, and reactive to light. Conjunctivae and EOM are normal. No scleral icterus.  Neck: Normal range of motion. Neck supple. No JVD present. No thyromegaly present.  Cardiovascular: Intact distal pulses. Exam reveals no gallop and no friction rub.  No murmur heard. Tachycardic, irregular rhythm  Respiratory: Effort normal and breath sounds normal. No respiratory distress. She has no wheezes. She has no  rales.  GI: Soft. Bowel sounds are normal. She exhibits no distension. There is no abdominal tenderness.  Musculoskeletal: Normal range of motion.        General: No edema.     Comments: No arthritis, no gout  Lymphadenopathy:    She has no cervical adenopathy.  Neurological: She is alert and oriented to person, place, and time. No cranial nerve deficit.  No dysarthria, no aphasia  Skin: Skin is warm and dry. No rash noted. No erythema.  Psychiatric: She has a normal mood and affect. Her behavior is normal. Judgment and thought content normal.    LABORATORY PANEL:   CBC Recent Labs  Lab 10/16/18 2027  WBC 10.5  HGB 14.3  HCT 43.6  PLT 316   ------------------------------------------------------------------------------------------------------------------  Chemistries  Recent Labs  Lab 10/16/18 2027  NA 138  K  4.2  CL 93*  CO2 30  GLUCOSE 173*  BUN 50*  CREATININE 1.04*  CALCIUM 10.1   ------------------------------------------------------------------------------------------------------------------  Cardiac Enzymes Recent Labs  Lab 10/16/18 2027  TROPONINI 0.09*   ------------------------------------------------------------------------------------------------------------------  RADIOLOGY:  No results found.  EKG:   Orders placed or performed during the hospital encounter of 10/16/18  . EKG 12-Lead  . EKG 12-Lead  . EKG 12-Lead  . EKG 12-Lead    IMPRESSION AND PLAN:  Principal Problem:   Atrial fibrillation with RVR (HCC) -now given 2 doses of IV diltiazem with moderate rate control.  Will admit her to telemetry floor with cardiac monitoring and continue nodal blocking agents as needed for heart rate control, goal less than 110.  Trend cardiac enzymes.  We will get a cardiology consult. Active Problems:   Hypertension -blood pressure is normotensive at this time, hold antihypertensives for now given use of diltiazem as above   Diabetes mellitus without  complication (HCC) -sliding scale insulin coverage   CAD (coronary artery disease) -continue home meds, other work-up as above  Chart review performed and case discussed with ED provider. Labs, imaging and/or ECG reviewed by provider and discussed with patient/family. Management plans discussed with the patient and/or family.  COVID-19 status: Tested negative     DVT PROPHYLAXIS: SubQ lovenox   GI PROPHYLAXIS:  None  ADMISSION STATUS: Observation  CODE STATUS: Full Code Status History    Date Active Date Inactive Code Status Order ID Comments User Context   05/07/2018 1349 05/09/2018 1612 Full Code [id number]  Bettey Costa, MD Inpatient   04/04/2017 1431 04/04/2017 2137 DNR [id number]  Asencion Gowda, NP Inpatient   04/02/2017 1837 04/04/2017 1430 Full Code [id number]  Vaughan Basta, MD Inpatient   01/22/2015 1036 01/22/2015 1522 Full Code [id number]  Schnier, Dolores Lory, MD Inpatient      TOTAL TIME TAKING CARE OF THIS PATIENT: 40 minutes.   This patient was evaluated in the context of the global COVID-19 pandemic, which necessitated consideration that the patient might be at risk for infection with the SARS-CoV-2 virus that causes COVID-19. Institutional protocols and algorithms that pertain to the evaluation of patients at risk for COVID-19 are in a state of rapid change based on information released by regulatory bodies including the CDC and federal and state organizations. These policies and algorithms were followed to the best of this provider's knowledge to date during the patient's care at this facility.  Ethlyn Daniels 10/16/2018, 10:53 PM  [facility]  Office  [phone]  CC: Primary care physician; Albina Billet, MD  Note:  This document was prepared using Dragon voice recognition software and may include unintentional dictation errors.

## 2018-10-16 NOTE — ED Notes (Signed)
ED TO INPATIENT HANDOFF REPORT  ED Nurse Name and Phone #: Blane Worthington  S Name/Age/Gender Yolanda Turner 85 y.o. female Room/Bed: ED19A/ED19A  Code Status   Code Status: Prior  Home/SNF/Other Home Patient oriented to: self Is this baseline? No   Triage Complete: Triage complete  Chief Complaint Back Pain  Triage Note Patient was brought in for back pain since Thursday. Patient has a history of sciatica. Family is unable to control pain at home.   Allergies Allergies  Allergen Reactions  . Penicillins Rash    Has patient had a PCN reaction causing immediate rash, facial/tongue/throat swelling, SOB or lightheadedness with hypotension: No Has patient had a PCN reaction causing severe rash involving mucus membranes or skin necrosis: No Has patient had a PCN reaction that required hospitalization: No Has patient had a PCN reaction occurring within the last 10 years: No If all of the above answers are "NO", then may proceed with Cephalosporin use.    Level of Care/Admitting Diagnosis ED Disposition    ED Disposition Condition [facility] Area: [place] [[id number]]  Level of Care: Telemetry [5]  Covid Evaluation: N/A  Diagnosis: Atrial fibrillation with RVR [facility]) [[id number]]  Admitting Physician: Lance Coon [[id number]]  Attending Physician: Lance Coon [[id number]]  Bed request comments: 2a  PT Class (Do Not Modify): Observation [104]  PT Acc Code (Do Not Modify): Observation [10022]       B Medical/Surgery History Past Medical History:  Diagnosis Date  . Carotid artery stenosis   . Carotid artery stenosis   . Collagen vascular disease ([place])   . Coronary artery disease   . Diabetes mellitus without complication (HCC)    diet control  . GERD (gastroesophageal reflux disease)   . Hypertension   . Myocardial infarction ([place])   . Osteoporosis   . Peripheral vascular disease ([place])   . Pneumonia   . Renal artery stenosis ([place])   .  Status post double vessel coronary artery bypass 2005  . Stroke ([place])   . Uterine cancer [facility])    Past Surgical History:  Procedure Laterality Date  . ABDOMINAL HYSTERECTOMY    . CAROTID ENDARTERECTOMY    . CATARACT EXTRACTION W/PHACO Right 09/28/2015   Procedure: CATARACT EXTRACTION PHACO AND INTRAOCULAR LENS PLACEMENT (IOC);  Surgeon: Estill Cotta, MD;  Location: [facility] ORS;  Service: Ophthalmology;  Laterality: Right;  [place] 041:18 AP% 28.6 CDE 114.94 fluid pack lot # [id number]  . CORONARY ARTERY BYPASS GRAFT    . PERIPHERAL VASCULAR CATHETERIZATION Right 01/22/2015   Procedure: Carotid PTA/Stent Intervention;  Surgeon: Katha Cabal, MD;  Location: [facility] CV LAB;  Service: Cardiovascular;  Laterality: Right;  . uterine cancer surgery       A IV Location/Drains/Wounds Patient Lines/Drains/Airways Status   Active Line/Drains/Airways    Name:   Placement date:   Placement time:   Site:   Days:   Peripheral IV 10/16/18 Left;Upper Forearm   10/16/18    2005    Forearm   less than 1   Incision (Closed) 09/28/15 Eye Right   09/28/15    0748     1114          Intake/Output Last 24 hours  Intake/Output Summary (Last 24 hours) at 10/16/2018 2304 Last data filed at 10/16/2018 2134 Gross per 24 hour  Intake 999 ml  Output -  Net 999 ml    Labs/Imaging Results for orders placed or performed during the hospital encounter of  10/16/18 (from the past 48 hour(s))  CBC with Differential     Status: Abnormal   Collection Time: 10/16/18  8:27 PM  Result Value Ref Range   WBC 10.5 4.0 - 10.5 K/uL   RBC 4.96 3.87 - 5.11 MIL/uL   Hemoglobin 14.3 12.0 - 15.0 g/dL   HCT 43.6 36.0 - 46.0 %   MCV 87.9 80.0 - 100.0 fL   MCH 28.8 26.0 - 34.0 pg   MCHC 32.8 30.0 - 36.0 g/dL   RDW 12.5 11.5 - 15.5 %   Platelets 316 150 - 400 K/uL   nRBC 0.0 0.0 - 0.2 %   Neutrophils Relative % 80 %   Neutro Abs 8.3 (H) 1.7 - 7.7 K/uL   Lymphocytes Relative 10 %   Lymphs Abs 1.1 0.7 - 4.0 K/uL    Monocytes Relative 10 %   Monocytes Absolute 1.1 (H) 0.1 - 1.0 K/uL   Eosinophils Relative 0 %   Eosinophils Absolute 0.0 0.0 - 0.5 K/uL   Basophils Relative 0 %   Basophils Absolute 0.0 0.0 - 0.1 K/uL   Immature Granulocytes 0 %   Abs Immature Granulocytes 0.04 0.00 - 0.07 K/uL    Comment: Performed at [facility], [address] [place], [place] [address]  Basic metabolic panel     Status: Abnormal   Collection Time: 10/16/18  8:27 PM  Result Value Ref Range   Sodium 138 135 - 145 mmol/L   Potassium 4.2 3.5 - 5.1 mmol/L   Chloride 93 (L) 98 - 111 mmol/L   CO2 30 22 - 32 mmol/L   Glucose, Bld 173 (H) 70 - 99 mg/dL   BUN 50 (H) 8 - 23 mg/dL   Creatinine, Ser 1.04 (H) 0.44 - 1.00 mg/dL   Calcium 10.1 8.9 - 10.3 mg/dL   GFR calc non Af Amer 47 (L) >60 mL/min   GFR calc Af Amer 55 (L) >60 mL/min   Anion gap 15 5 - 15    Comment: Performed at [facility], [address] [place], [place] [address]  Troponin I - ONCE - STAT     Status: Abnormal   Collection Time: 10/16/18  8:27 PM  Result Value Ref Range   Troponin I 0.09 (HH) <0.03 ng/mL    Comment: CRITICAL RESULT CALLED TO, READ BACK BY AND VERIFIED WITH Canio Winokur 10/16/18 @ 2107  [place] Performed at [facility], [address] [place], [place] [address]   Urinalysis, Complete w Microscopic     Status: Abnormal   Collection Time: 10/16/18  8:27 PM  Result Value Ref Range   Color, Urine YELLOW (A) YELLOW   APPearance CLEAR (A) CLEAR   Specific Gravity, Urine 1.019 1.005 - 1.030   pH 5.0 5.0 - 8.0   Glucose, UA NEGATIVE NEGATIVE mg/dL   Hgb urine dipstick NEGATIVE NEGATIVE   Bilirubin Urine NEGATIVE NEGATIVE   Ketones, ur 5 (A) NEGATIVE mg/dL   Protein, ur NEGATIVE NEGATIVE mg/dL   Nitrite NEGATIVE NEGATIVE   Leukocytes,Ua NEGATIVE NEGATIVE    Comment: Performed at [facility], [address] [place], [place] [address]  SARS Coronavirus 2 [facility] order, Performed in [facility] hospital lab)     Status: None   Collection Time: 10/16/18  9:20 PM  Result Value Ref Range   SARS Coronavirus 2 NEGATIVE NEGATIVE    Comment: (NOTE) If result is NEGATIVE SARS-CoV-2 target nucleic acids are NOT DETECTED. The SARS-CoV-2 RNA is generally  detectable in upper and lower  respiratory specimens during the acute phase of infection. The lowest  concentration of SARS-CoV-2 viral copies this assay can detect is 250  copies / mL. A negative result does not preclude SARS-CoV-2 infection  and should not be used as the sole basis for treatment or other  patient management decisions.  A negative result may occur with  improper specimen collection / handling, submission of specimen other  than nasopharyngeal swab, presence of viral mutation(s) within the  areas targeted by this assay, and inadequate number of viral copies  (<250 copies / mL). A negative result must be combined with clinical  observations, patient history, and epidemiological information. If result is POSITIVE SARS-CoV-2 target nucleic acids are DETECTED. The SARS-CoV-2 RNA is generally detectable in upper and lower  respiratory specimens dur ing the acute phase of infection.  Positive  results are indicative of active infection with SARS-CoV-2.  Clinical  correlation with patient history and other diagnostic information is  necessary to determine patient infection status.  Positive results do  not rule out bacterial infection or co-infection with other viruses. If result is PRESUMPTIVE POSTIVE SARS-CoV-2 nucleic acids MAY BE PRESENT.   A presumptive positive result was obtained on the submitted specimen  and confirmed on repeat testing.  While 2019 novel coronavirus  (SARS-CoV-2) nucleic acids may be present in the submitted sample  additional confirmatory testing may be necessary for epidemiological  and / or clinical management purposes  to differentiate between  SARS-CoV-2 and other Sarbecovirus currently  known to infect humans.  If clinically indicated additional testing with an alternate test  methodology [phone]) is advised. The SARS-CoV-2 RNA is generally  detectable in upper and lower respiratory sp ecimens during the acute  phase of infection. The expected result is Negative. Fact Sheet for Patients:  StrictlyIdeas.no Fact Sheet for Healthcare Providers: BankingDealers.co.za This test is not yet approved or cleared by the [place] FDA and has been authorized for detection and/or diagnosis of SARS-CoV-2 by FDA under an Emergency Use Authorization (EUA).  This EUA will remain in effect (meaning this test can be used) for the duration of the COVID-19 declaration under Section 564(b)(1) of the Act, 21 U.S.C. section 360bbb-3(b)(1), unless the authorization is terminated or revoked sooner. Performed at [facility], [address] [place], [place] [address]    No results found.  Pending Labs [employer] (From admission, onward)    Start     Ordered   Signed and Held  Troponin I - Now Then Q6H  Now then every 6 hours,   R     Signed and Held   Signed and Held  CBC  (enoxaparin (LOVENOX)    CrCl >/= 30 ml/min)  Once,   R    Comments:  Baseline for enoxaparin therapy IF NOT ALREADY DRAWN.  Notify MD if PLT < 100 K.    Signed and Held   Signed and Held  Creatinine, serum  (enoxaparin (LOVENOX)    CrCl >/= 30 ml/min)  Once,   R    Comments:  Baseline for enoxaparin therapy IF NOT ALREADY DRAWN.    Signed and Held   Signed and Held  Creatinine, serum  (enoxaparin (LOVENOX)    CrCl >/= 30 ml/min)  Weekly,   R    Comments:  while on enoxaparin therapy    Signed and Held   Signed and Held  Basic metabolic panel  Tomorrow morning,   R  Signed and Held   Signed and Held  CBC  Tomorrow morning,   R     Signed and Held          Vitals/Pain Today's Vitals   10/16/18 2159 10/16/18 2210 10/16/18 2213 10/16/18  2230  BP:  (!) 98/44  123/62  Pulse:  (!) 137    Resp:  (!) 23 (!) 22 18  Temp:      TempSrc:      SpO2: 100% 96%      Isolation Precautions No active isolations  Medications Medications  aspirin chewable tablet 324 mg (324 mg Oral Not Given 10/16/18 2218)  sodium chloride 0.9 % bolus 1,000 mL (0 mLs Intravenous Stopped 10/16/18 2134)  diltiazem (CARDIZEM) injection 10 mg (10 mg Intravenous Given 10/16/18 2119)  diltiazem (CARDIZEM) injection 10 mg (10 mg Intravenous Given 10/16/18 2249)    Mobility non-ambulatory High fall risk   Focused Assessments    R Recommendations: See Admitting Provider Note  Report given to:   Additional Notes:

## 2018-10-17 DIAGNOSIS — Z7982 Long term (current) use of aspirin: Secondary | ICD-10-CM | POA: Diagnosis not present

## 2018-10-17 DIAGNOSIS — E785 Hyperlipidemia, unspecified: Secondary | ICD-10-CM | POA: Diagnosis present

## 2018-10-17 DIAGNOSIS — Z90710 Acquired absence of both cervix and uterus: Secondary | ICD-10-CM | POA: Diagnosis not present

## 2018-10-17 DIAGNOSIS — I4891 Unspecified atrial fibrillation: Secondary | ICD-10-CM | POA: Diagnosis not present

## 2018-10-17 DIAGNOSIS — I959 Hypotension, unspecified: Secondary | ICD-10-CM | POA: Diagnosis present

## 2018-10-17 DIAGNOSIS — I252 Old myocardial infarction: Secondary | ICD-10-CM | POA: Diagnosis not present

## 2018-10-17 DIAGNOSIS — I248 Other forms of acute ischemic heart disease: Secondary | ICD-10-CM | POA: Diagnosis present

## 2018-10-17 DIAGNOSIS — Z66 Do not resuscitate: Secondary | ICD-10-CM | POA: Diagnosis present

## 2018-10-17 DIAGNOSIS — Z8542 Personal history of malignant neoplasm of other parts of uterus: Secondary | ICD-10-CM | POA: Diagnosis not present

## 2018-10-17 DIAGNOSIS — M810 Age-related osteoporosis without current pathological fracture: Secondary | ICD-10-CM | POA: Diagnosis present

## 2018-10-17 DIAGNOSIS — I10 Essential (primary) hypertension: Secondary | ICD-10-CM | POA: Diagnosis present

## 2018-10-17 DIAGNOSIS — Z8673 Personal history of transient ischemic attack (TIA), and cerebral infarction without residual deficits: Secondary | ICD-10-CM | POA: Diagnosis not present

## 2018-10-17 DIAGNOSIS — K219 Gastro-esophageal reflux disease without esophagitis: Secondary | ICD-10-CM | POA: Diagnosis present

## 2018-10-17 DIAGNOSIS — E1151 Type 2 diabetes mellitus with diabetic peripheral angiopathy without gangrene: Secondary | ICD-10-CM | POA: Diagnosis present

## 2018-10-17 DIAGNOSIS — Z79899 Other long term (current) drug therapy: Secondary | ICD-10-CM | POA: Diagnosis not present

## 2018-10-17 DIAGNOSIS — I480 Paroxysmal atrial fibrillation: Secondary | ICD-10-CM | POA: Diagnosis present

## 2018-10-17 DIAGNOSIS — R4701 Aphasia: Secondary | ICD-10-CM | POA: Diagnosis present

## 2018-10-17 DIAGNOSIS — I2510 Atherosclerotic heart disease of native coronary artery without angina pectoris: Secondary | ICD-10-CM | POA: Diagnosis present

## 2018-10-17 DIAGNOSIS — Z7902 Long term (current) use of antithrombotics/antiplatelets: Secondary | ICD-10-CM | POA: Diagnosis not present

## 2018-10-17 DIAGNOSIS — E1142 Type 2 diabetes mellitus with diabetic polyneuropathy: Secondary | ICD-10-CM | POA: Diagnosis present

## 2018-10-17 DIAGNOSIS — Z87891 Personal history of nicotine dependence: Secondary | ICD-10-CM | POA: Diagnosis not present

## 2018-10-17 DIAGNOSIS — Z951 Presence of aortocoronary bypass graft: Secondary | ICD-10-CM | POA: Diagnosis not present

## 2018-10-17 DIAGNOSIS — Z1159 Encounter for screening for other viral diseases: Secondary | ICD-10-CM | POA: Diagnosis not present

## 2018-10-17 DIAGNOSIS — Z515 Encounter for palliative care: Secondary | ICD-10-CM | POA: Diagnosis not present

## 2018-10-17 LAB — CBC
HCT: 37.5 % (ref 36.0–46.0)
Hemoglobin: 12.2 g/dL (ref 12.0–15.0)
MCH: 28.8 pg (ref 26.0–34.0)
MCHC: 32.5 g/dL (ref 30.0–36.0)
MCV: 88.4 fL (ref 80.0–100.0)
Platelets: 231 10*3/uL (ref 150–400)
RBC: 4.24 MIL/uL (ref 3.87–5.11)
RDW: 12.5 % (ref 11.5–15.5)
WBC: 7 10*3/uL (ref 4.0–10.5)
nRBC: 0 % (ref 0.0–0.2)

## 2018-10-17 LAB — GLUCOSE, CAPILLARY
Glucose-Capillary: 137 mg/dL — ABNORMAL HIGH (ref 70–99)
Glucose-Capillary: 138 mg/dL — ABNORMAL HIGH (ref 70–99)
Glucose-Capillary: 116 mg/dL — ABNORMAL HIGH (ref 70–99)
Glucose-Capillary: 142 mg/dL — ABNORMAL HIGH (ref 70–99)

## 2018-10-17 LAB — URINALYSIS, COMPLETE (UACMP) WITH MICROSCOPIC
Bacteria, UA: NONE SEEN
Bilirubin Urine: NEGATIVE
Glucose, UA: NEGATIVE mg/dL
Hgb urine dipstick: NEGATIVE
Ketones, ur: 5 mg/dL — AB
Leukocytes,Ua: NEGATIVE
Nitrite: NEGATIVE
Protein, ur: NEGATIVE mg/dL
Specific Gravity, Urine: 1.019 (ref 1.005–1.030)
Squamous Epithelial / HPF: NONE SEEN (ref 0–5)
pH: 5 (ref 5.0–8.0)

## 2018-10-17 LAB — BASIC METABOLIC PANEL
Anion gap: 11 (ref 5–15)
BUN: 48 mg/dL — ABNORMAL HIGH (ref 8–23)
CO2: 29 mmol/L (ref 22–32)
Calcium: 8.8 mg/dL — ABNORMAL LOW (ref 8.9–10.3)
Chloride: 98 mmol/L (ref 98–111)
Creatinine, Ser: 0.92 mg/dL (ref 0.44–1.00)
GFR calc Af Amer: >60 mL/min (ref >60)
GFR calc non Af Amer: 55 mL/min — ABNORMAL LOW (ref >60)
Glucose, Bld: 151 mg/dL — ABNORMAL HIGH (ref 70–99)
Potassium: 3.8 mmol/L (ref 3.5–5.1)
Sodium: 138 mmol/L (ref 135–145)

## 2018-10-17 LAB — HEPARIN LEVEL (UNFRACTIONATED)
Heparin Unfractionated: 0.38 IU/mL (ref 0.30–0.70)
Heparin Unfractionated: 0.27 IU/mL — ABNORMAL LOW (ref 0.30–0.70)

## 2018-10-17 LAB — PROTIME-INR
INR: 1.1 (ref 0.8–1.2)
Prothrombin Time: 14 seconds (ref 11.4–15.2)

## 2018-10-17 LAB — APTT: aPTT: 27 seconds (ref 24–36)

## 2018-10-17 LAB — TROPONIN I
Troponin I: 0.38 ng/mL (ref <0.03)
Troponin I: 2.15 ng/mL (ref <0.03)

## 2018-10-17 MED ORDER — SODIUM CHLORIDE 0.9 % IV SOLN
INTRAVENOUS | Status: DC | PRN
  Administered 2018-10-17: 1000 mL via INTRAVENOUS

## 2018-10-17 MED ORDER — KETOROLAC TROMETHAMINE 15 MG/ML IJ SOLN
15.0000 mg | Freq: Four times a day (QID) | INTRAMUSCULAR | Status: DC | PRN
  Administered 2018-10-19: 15 mg via INTRAVENOUS
  Filled 2018-10-17: qty 1

## 2018-10-17 MED ORDER — DILTIAZEM HCL-DEXTROSE 100-5 MG/100ML-% IV SOLN (PREMIX)
5.0000 mg/h | INTRAVENOUS | Status: DC
  Filled 2018-10-17: qty 100

## 2018-10-17 MED ORDER — SIMVASTATIN 20 MG PO TABS
40.0000 mg | ORAL_TABLET | Freq: Every day | ORAL | Status: DC
  Administered 2018-10-17 – 2018-10-18 (×2): 40 mg via ORAL
  Filled 2018-10-17 (×2): qty 2

## 2018-10-17 MED ORDER — ENSURE ENLIVE PO LIQD
237.0000 mL | Freq: Two times a day (BID) | ORAL | Status: DC
  Administered 2018-10-18 – 2018-10-19 (×3): 237 mL via ORAL

## 2018-10-17 MED ORDER — METOPROLOL TARTRATE 25 MG PO TABS
12.5000 mg | ORAL_TABLET | Freq: Two times a day (BID) | ORAL | Status: DC
  Administered 2018-10-17 (×2): 12.5 mg via ORAL
  Filled 2018-10-17 (×2): qty 1

## 2018-10-17 MED ORDER — SODIUM CHLORIDE 0.9% FLUSH
3.0000 mL | Freq: Two times a day (BID) | INTRAVENOUS | Status: DC
  Administered 2018-10-17 – 2018-10-19 (×4): 3 mL via INTRAVENOUS

## 2018-10-17 MED ORDER — HEPARIN BOLUS VIA INFUSION
2000.0000 [IU] | Freq: Once | INTRAVENOUS | Status: AC
  Administered 2018-10-17: 2000 [IU] via INTRAVENOUS
  Filled 2018-10-17: qty 2000

## 2018-10-17 MED ORDER — HEPARIN (PORCINE) 25000 UT/250ML-% IV SOLN
650.0000 [IU]/h | INTRAVENOUS | Status: DC
  Administered 2018-10-17: 500 [IU]/h via INTRAVENOUS
  Filled 2018-10-17: qty 250

## 2018-10-17 MED ORDER — GABAPENTIN 300 MG PO CAPS
300.0000 mg | ORAL_CAPSULE | Freq: Every day | ORAL | Status: DC
  Administered 2018-10-17 – 2018-10-18 (×2): 300 mg via ORAL
  Filled 2018-10-17 (×2): qty 1

## 2018-10-17 MED ORDER — HEPARIN BOLUS VIA INFUSION
500.0000 [IU] | Freq: Once | INTRAVENOUS | Status: AC
  Administered 2018-10-17: 500 [IU] via INTRAVENOUS
  Filled 2018-10-17: qty 500

## 2018-10-17 NOTE — Consult Note (Signed)
[facility] Cardiology  CARDIOLOGY CONSULT NOTE  Patient ID: Yolanda Turner MRN: [id number] DOB/AGE: 1926-02-12 85 y.o.  Admit date: 10/16/2018 Referring Physician Verdell Carmine Primary Physician Hall Busing Primary Cardiologist Nehemiah Massed Reason for Consultation atrial fibrillation  HPI: 85 year old female referred for evaluation of atrial fibrillation with rapid ventricular rate.  Patient has a history of paroxysmal atrial fibrillation, on aspirin and clopidogrel, not on chronic anticoagulation due to her overall frailty with risk of falling.  She presents to [facility] emergency room on 10/16/2018 with chief complaint of back and left leg pain, but was noted to be in atrial fibrillation with rapid ventricular rate.  Patient was given 2 boluses of IV Cardizem, with rate control, eventually converted back to sinus rhythm.  ECG reveals normal sinus rhythm at 71 bpm with left bundle branch block which appears unchanged compared to prior ECG.  On questioning, patient denies chest pain.  Admission labs notable for elevated troponin 0 0.09, 0.38, 2.15, in the absence of chest pain.  Patient reports that she feels better overall.  Review of systems complete and found to be negative unless listed above     Past Medical History:  Diagnosis Date  . Carotid artery stenosis   . Carotid artery stenosis   . Collagen vascular disease ([place])   . Coronary artery disease   . Diabetes mellitus without complication (HCC)    diet control  . GERD (gastroesophageal reflux disease)   . Hypertension   . Myocardial infarction ([place])   . Osteoporosis   . Peripheral vascular disease ([place])   . Pneumonia   . Renal artery stenosis ([place])   . Status post double vessel coronary artery bypass 2005  . Stroke ([place])   . Uterine cancer [facility])     Past Surgical History:  Procedure Laterality Date  . ABDOMINAL HYSTERECTOMY    . CAROTID ENDARTERECTOMY    . CATARACT EXTRACTION W/PHACO Right 09/28/2015   Procedure: CATARACT EXTRACTION PHACO AND  INTRAOCULAR LENS PLACEMENT (IOC);  Surgeon: Estill Cotta, MD;  Location: [facility] ORS;  Service: Ophthalmology;  Laterality: Right;  [place] 041:18 AP% 28.6 CDE 114.94 fluid pack lot # [id number]  . CORONARY ARTERY BYPASS GRAFT    . PERIPHERAL VASCULAR CATHETERIZATION Right 01/22/2015   Procedure: Carotid PTA/Stent Intervention;  Surgeon: Katha Cabal, MD;  Location: [facility] CV LAB;  Service: Cardiovascular;  Laterality: Right;  . uterine cancer surgery      Medications Prior to Admission  Medication Sig Dispense Refill Last Dose  . clopidogrel (PLAVIX) 75 MG tablet Take 75 mg by mouth at bedtime.   unknown at unknown  . gabapentin (NEURONTIN) 300 MG capsule Take 300 mg by mouth at bedtime.   unknown at unknown  . lisinopril (PRINIVIL,ZESTRIL) 10 MG tablet Take 10 mg by mouth at bedtime.   1 unknown at unknown  . metoprolol succinate (TOPROL-XL) 50 MG 24 hr tablet Take 25 mg by mouth at bedtime.   5 unknown at unknown  . simvastatin (ZOCOR) 40 MG tablet Take 40 mg by mouth at bedtime.   5 unknown at unknown   Social History   Socioeconomic History  . Marital status: Widowed    Spouse name: Not on file  . Number of children: Not on file  . Years of education: Not on file  . Highest education level: Not on file  Occupational History  . Not on file  Social Needs  . Financial resource strain: Not on file  . Food insecurity:    Worry: Not on  file    Inability: Not on file  . Transportation needs:    Medical: Not on file    Non-medical: Not on file  Tobacco Use  . Smoking status: Former Research scientist (life sciences)  . Smokeless tobacco: Never Used  Substance and Sexual Activity  . Alcohol use: No  . Drug use: No  . Sexual activity: Not on file  Lifestyle  . Physical activity:    Days per week: Not on file    Minutes per session: Not on file  . Stress: Not on file  Relationships  . Social connections:    Talks on phone: Not on file    Gets together: Not on file    Attends religious service:  Not on file    Active member of club or organization: Not on file    Attends meetings of clubs or organizations: Not on file    Relationship status: Not on file  . Intimate partner violence:    Fear of current or ex partner: Not on file    Emotionally abused: Not on file    Physically abused: Not on file    Forced sexual activity: Not on file  Other Topics Concern  . Not on file  Social History Narrative  . Not on file    Family History  Problem Relation Age of Onset  . Lung cancer Daughter       Review of systems complete and found to be negative unless listed above      PHYSICAL EXAM  General: Well developed, well nourished, in no acute distress HEENT:  Normocephalic and atramatic Neck:  No JVD.  Lungs: Clear bilaterally to auscultation and percussion. Heart: HRRR . Normal S1 and S2 without gallops or murmurs.  Abdomen: Bowel sounds are positive, abdomen soft and non-tender  Msk:  Back normal, normal gait. Normal strength and tone for age. Extremities: No clubbing, cyanosis or edema.   Neuro: Alert and oriented X 3. Psych:  Good affect, responds appropriately  Labs:   Lab Results  Component Value Date   WBC 7.0 10/17/2018   HGB 12.2 10/17/2018   HCT 37.5 10/17/2018   MCV 88.4 10/17/2018   PLT 231 10/17/2018    Recent Labs  Lab 10/17/18 0539  NA 138  K 3.8  CL 98  CO2 29  BUN 48*  CREATININE 0.92  CALCIUM 8.8*  GLUCOSE 151*   Lab Results  Component Value Date   TROPONINI 2.15 (HH) 10/17/2018    Lab Results  Component Value Date   CHOL 113 05/08/2018   CHOL 123 04/02/2017   Lab Results  Component Value Date   HDL 56 05/08/2018   HDL 60 04/02/2017   Lab Results  Component Value Date   LDLCALC 42 05/08/2018   LDLCALC 41 04/02/2017   Lab Results  Component Value Date   TRIG 76 05/08/2018   TRIG 108 04/02/2017   Lab Results  Component Value Date   CHOLHDL 2.0 05/08/2018   CHOLHDL 2.1 04/02/2017   No results found for: LDLDIRECT     Radiology: No results found.  EKG: Normal sinus rhythm, left bundle branch block  ASSESSMENT AND PLAN:   1.  Atrial fibrillation with rapid ventricular rate, likely due to and leg pain, converted back to sinus rhythm, currently on aspirin and clopidogrel per Dr. Nehemiah Massed, defers chronic anticoagulation in light of patient's overall frailty and risk off falling 2.  Elevated troponin, in the absence of chest pain, and patient who would be a poor  candidate for cardiac catheterization, currently on heparin drip 3.  Known CAD, status post CABG 2003, currently without chest pain  Recommendations  1.  Agree with overall current therapy 2.  Continue heparin drip 24 to 48 hours, then stop 3.  Continue aspirin and clopidogrel 4.  Defer chronic anticoagulation 5.  Defer cardiac catheterization in light of patient's advanced age and frailty, and absence of chest pain 6.  Add metoprolol tartrate 12.5 mg twice daily 7.  Review 2D echocardiogram   Signed: Isaias Cowman MD,PhD, [facility] 10/17/2018, 7:56 AM

## 2018-10-17 NOTE — Progress Notes (Addendum)
ANTICOAGULATION CONSULT NOTE - Initial Consult  Pharmacy Consult for Heparin  Indication: chest pain/ACS  Allergies  Allergen Reactions  . Penicillins Rash    Has patient had a PCN reaction causing immediate rash, facial/tongue/throat swelling, SOB or lightheadedness with hypotension: No Has patient had a PCN reaction causing severe rash involving mucus membranes or skin necrosis: No Has patient had a PCN reaction that required hospitalization: No Has patient had a PCN reaction occurring within the last 10 years: No If all of the above answers are "NO", then may proceed with Cephalosporin use.    Patient Measurements: Weight: 74 lb 8.3 oz (33.8 kg) Heparin Dosing Weight:  33.8 kg   Vital Signs: Temp: 97.9 F (36.6 C) (05/06 1630) Temp Source: Oral (05/06 1630) BP: 152/63 (05/06 1630) Pulse Rate: 71 (05/06 1630)  Labs: Recent Labs    10/16/18 2027 10/17/18 0028 10/17/18 0204 10/17/18 0539 10/17/18 1003 10/17/18 1815  HGB 14.3  --   --  12.2  --   --   HCT 43.6  --   --  37.5  --   --   PLT 316  --   --  231  --   --   APTT  --   --  27  --   --   --   LABPROT  --   --  14.0  --   --   --   INR  --   --  1.1  --   --   --   HEPARINUNFRC  --   --   --   --  0.38 0.27*  CREATININE 1.04*  --   --  0.92  --   --   TROPONINI 0.09* 0.38*  --  2.15*  --   --     Estimated Creatinine Clearance: 21.7 mL/min (by C-G formula based on SCr of 0.92 mg/dL).   Medical History: Past Medical History:  Diagnosis Date  . Carotid artery stenosis   . Carotid artery stenosis   . Collagen vascular disease ([place])   . Coronary artery disease   . Diabetes mellitus without complication (HCC)    diet control  . GERD (gastroesophageal reflux disease)   . Hypertension   . Myocardial infarction ([place])   . Osteoporosis   . Peripheral vascular disease ([place])   . Pneumonia   . Renal artery stenosis ([place])   . Status post double vessel coronary artery bypass 2005  . Stroke ([place])   . Uterine  cancer (HCC)     Medications:  Medications Prior to Admission  Medication Sig Dispense Refill Last Dose  . clopidogrel (PLAVIX) 75 MG tablet Take 75 mg by mouth at bedtime.   unknown at unknown  . gabapentin (NEURONTIN) 300 MG capsule Take 300 mg by mouth at bedtime.   unknown at unknown  . lisinopril (PRINIVIL,ZESTRIL) 10 MG tablet Take 10 mg by mouth at bedtime.   1 unknown at unknown  . metoprolol succinate (TOPROL-XL) 50 MG 24 hr tablet Take 25 mg by mouth at bedtime.   5 unknown at unknown  . simvastatin (ZOCOR) 40 MG tablet Take 40 mg by mouth at bedtime.   5 unknown at unknown    Assessment: Patient admitted for back pain was found to be in afib w/ RVR and PVC's w/ wide QRS complexes/LBBB. Patient has a h/o of afib not on anticoagulation PTA. Initial trop in ED was 0.09 now up to 0.38. Patient is being started on heparin drip for  possible NSTEMI. Patient was given a 2000 unit bolus followed by an infusion of 500 units/hr. There was a drop in Hgb and plt Continue to monitor.   5/6 @1003  HL 0.38 - continue rate.   Goal of Therapy:  Heparin level 0.3-0.7 units/ml Monitor platelets by anticoagulation protocol: Yes   Plan:  Will continue current rate and order heparin level in 8 hours.  Will monitor daily CBC's and adjust per anti-Xa levels.  5/6:  HL @ 1815 = 0.27 Will order Heparin 500 units IV X 1 bolus and increase drip rate to 550 units/hr. Will recheck HL 8 hrs after rate change.  Jedrick Hutcherson D 10/17/2018,6:55 PM

## 2018-10-17 NOTE — Progress Notes (Signed)
Initial Nutrition Assessment  RD working remotely.  DOCUMENTATION CODES:   Underweight  INTERVENTION:  Will downgrade diet to dysphagia 3 (mechanical soft) with thin liquids.  Provide Ensure Enlive po BID, each supplement provides 350 kcal and 20 grams of protein.  Provide Magic cup TID with meals, each supplement provides 290 kcal and 9 grams of protein.  NUTRITION DIAGNOSIS:   Inadequate oral intake related to decreased appetite as evidenced by meal completion < 25%.  GOAL:   Patient will meet greater than or equal to 90% of their needs  MONITOR:   PO intake, Supplement acceptance, Labs, Weight trends, I & O's  REASON FOR ASSESSMENT:   Malnutrition Screening Tool, Consult Assessment of nutrition requirement/status  ASSESSMENT:   85 year old female with PMHx of PVD, CAD, hx MI, hx CABGx2, HTN, hx CVA, GERD, DM, renal artery stenosis, OP admitted with A-fib with RVR, elevated troponin.   Spoke with patient over the phone but she was unable to provide history. Patient ate 0% of breakfast this morning per chart. Weight appears stable per chart. Patient is underweight. RD suspects patient is malnourished but unable to confirm without completing NFPE.  Left voicemail with daughter [name] and she was able to return call later in the day. She reports patient has had a poor appetite since 4/30. She has not felt well and had some N/V. Since then she has only been taking sips of water, Boost, and bites of gelatin. Daughter reports that at baseline patient has a good appetite. She eats biscuits with gravy for breakfast. Then tends to have vegetables for the rest of the day as she does not like meat. Daughter reports patient would likely benefit from chopped food for ease of chewing. She reports patient has been weight-stable for a while now and has always been very small. Daughter reports that patient will likely not eat well during admission as she never eats well when she doesn't feel  well.   Medications reviewed and include: gabapentin, Novolog 0-9 units TID, Novolog 0-5 units QHS, heparin gtt.  Labs reviewed: CBG 137-138, BUN 48.  NUTRITION - FOCUSED PHYSICAL EXAM:  Unable to complete at this time.  Diet Order:   Diet Order            Diet Heart Room service appropriate? Yes; Fluid consistency: Thin  Diet effective now             EDUCATION NEEDS:   Not appropriate for education at this time  Skin:  Skin Assessment: Reviewed RN Assessment(blanchable redness to coccyx)  Last BM:  10/16/2018 per chart  Height:   Ht Readings from Last 1 Encounters:  05/07/18 5' (1.524 m)   Weight:   Wt Readings from Last 1 Encounters:  10/17/18 33.8 kg   Ideal Body Weight:  45.5 kg(calculated with ht of 5' from previous encounter)  BMI:  Body mass index is 14.55 kg/m.  Estimated Nutritional Needs:   Kcal:  1200-1400  Protein:  50-60 grams  Fluid:  1.2-1.4 L/day  Willey Blade, MS, RD, LDN Office: [phone] Pager: [phone] After Hours/Weekend Pager: [phone]

## 2018-10-17 NOTE — Progress Notes (Signed)
ANTICOAGULATION CONSULT NOTE - Initial Consult  Pharmacy Consult for heparin drip Indication: chest pain/ACS  Allergies  Allergen Reactions  . Penicillins Rash    Has patient had a PCN reaction causing immediate rash, facial/tongue/throat swelling, SOB or lightheadedness with hypotension: No Has patient had a PCN reaction causing severe rash involving mucus membranes or skin necrosis: No Has patient had a PCN reaction that required hospitalization: No Has patient had a PCN reaction occurring within the last 10 years: No If all of the above answers are "NO", then may proceed with Cephalosporin use.    Patient Measurements: Weight: 74 lb 8.3 oz (33.8 kg) Heparin Dosing Weight: 33.8 kg  Vital Signs: Temp: 97.9 F (36.6 C) (05/06 0807) Temp Source: Axillary (05/06 0807) BP: 150/61 (05/06 0807) Pulse Rate: 76 (05/06 0807)  Labs: Recent Labs    10/16/18 2027 10/17/18 0028 10/17/18 0204 10/17/18 0539 10/17/18 1003  HGB 14.3  --   --  12.2  --   HCT 43.6  --   --  37.5  --   PLT 316  --   --  231  --   APTT  --   --  27  --   --   LABPROT  --   --  14.0  --   --   INR  --   --  1.1  --   --   HEPARINUNFRC  --   --   --   --  0.38  CREATININE 1.04*  --   --  0.92  --   TROPONINI 0.09* 0.38*  --  2.15*  --     Estimated Creatinine Clearance: 21.7 mL/min (by C-G formula based on SCr of 0.92 mg/dL).   Medical History: Past Medical History:  Diagnosis Date  . Carotid artery stenosis   . Carotid artery stenosis   . Collagen vascular disease ([place])   . Coronary artery disease   . Diabetes mellitus without complication (HCC)    diet control  . GERD (gastroesophageal reflux disease)   . Hypertension   . Myocardial infarction ([place])   . Osteoporosis   . Peripheral vascular disease ([place])   . Pneumonia   . Renal artery stenosis ([place])   . Status post double vessel coronary artery bypass 2005  . Stroke ([place])   . Uterine cancer (HCC)     Medications:  Scheduled:  .  aspirin  324 mg Oral Once  . aspirin EC  81 mg Oral Daily  . clopidogrel  75 mg Oral QHS  . insulin aspart  0-5 Units Subcutaneous QHS  . insulin aspart  0-9 Units Subcutaneous TID WC  . metoprolol tartrate  12.5 mg Oral BID    Assessment: Patient admitted for back pain was found to be in afib w/ RVR and PVC's w/ wide QRS complexes/LBBB. Patient has a h/o of afib not on anticoagulation PTA. Initial trop in ED was 0.09 now up to 0.38. Patient is being started on heparin drip for possible NSTEMI. Patient was given a 2000 unit bolus followed by an infusion of 500 units/hr. There was a drop in Hgb and plt Continue to monitor.   5/6 @1003  HL 0.38 - continue rate.   Goal of Therapy:  Heparin level 0.3-0.7 units/ml Monitor platelets by anticoagulation protocol: Yes   Plan:  Will continue current rate and order heparin level in 8 hours.  Will monitor daily CBC's and adjust per anti-Xa levels.  Eleonore Chiquito, PharmD, BCPS Clinical Pharmacist 10/17/2018

## 2018-10-17 NOTE — Progress Notes (Signed)
   [facility] [place] Hospital Day: 0 days Yolanda Turner is a 85 y.o. female past medical history of previous CVA, coronary artery disease, history of uterine cancer, peripheral vascular disease, hypertension GERD diabetes history of carotid artery stenosispresenting with Back Pain .   I discussed with the patient's daughters over the phone regarding patient's overall health and also discussed CODE STATUS with them.  Explained to them the difference between DNR and full code/scope of care.  Patient's daughters presently want to discuss amongst himself and also talk to her son before making a decision.  For now patient remains a full code.  Advance care planning discussed with patient  without additional Family at bedside. All questions in regards to overall condition and expected prognosis answered. The decision was made to continue current code status  CODE STATUS: full Time spent: 16 minutes

## 2018-10-17 NOTE — Progress Notes (Signed)
ANTICOAGULATION CONSULT NOTE - Initial Consult  Pharmacy Consult for heparin drip Indication: chest pain/ACS  Allergies  Allergen Reactions  . Penicillins Rash    Has patient had a PCN reaction causing immediate rash, facial/tongue/throat swelling, SOB or lightheadedness with hypotension: No Has patient had a PCN reaction causing severe rash involving mucus membranes or skin necrosis: No Has patient had a PCN reaction that required hospitalization: No Has patient had a PCN reaction occurring within the last 10 years: No If all of the above answers are "NO", then may proceed with Cephalosporin use.    Patient Measurements: Weight: 74 lb 8.3 oz (33.8 kg) Heparin Dosing Weight: 33.8 kg  Vital Signs: Temp: 97.5 F (36.4 C) (05/05 2354) Temp Source: Oral (05/05 2354) BP: 137/96 (05/05 2354) Pulse Rate: 62 (05/05 2354)  Labs: Recent Labs    10/16/18 2027 10/17/18 0028  HGB 14.3  --   HCT 43.6  --   PLT 316  --   CREATININE 1.04*  --   TROPONINI 0.09* 0.38*    Estimated Creatinine Clearance: 19.2 mL/min (A) (by C-G formula based on SCr of 1.04 mg/dL (H)).   Medical History: Past Medical History:  Diagnosis Date  . Carotid artery stenosis   . Carotid artery stenosis   . Collagen vascular disease ([place])   . Coronary artery disease   . Diabetes mellitus without complication (HCC)    diet control  . GERD (gastroesophageal reflux disease)   . Hypertension   . Myocardial infarction ([place])   . Osteoporosis   . Peripheral vascular disease ([place])   . Pneumonia   . Renal artery stenosis ([place])   . Status post double vessel coronary artery bypass 2005  . Stroke ([place])   . Uterine cancer (HCC)     Medications:  Scheduled:  . aspirin  324 mg Oral Once  . aspirin EC  81 mg Oral Daily  . clopidogrel  75 mg Oral QHS  . heparin  2,000 Units Intravenous Once  . insulin aspart  0-5 Units Subcutaneous QHS  . insulin aspart  0-9 Units Subcutaneous TID WC    Assessment: Patient  admitted for back pain was found to be in afib w/ RVR and PVC's w/ wide QRS complexes/LBBB. Patient has a h/o of afib not on anticoagulation PTA. Initial trop in ED was 0.09 now up to 0.38. Patient is being started on heparin drip for possible NSTEMI.  Goal of Therapy:  Heparin level 0.3-0.7 units/ml Monitor platelets by anticoagulation protocol: Yes   Plan:  Give 2000 units bolus x 1  Will start rate at 500 units/hr and will check anti-Xa @ 1000 Baseline labs pending, initial CBC WNL Will monitor daily CBC's and adjust per anti-Xa levels.  Tobie Lords, PharmD, BCPS Clinical Pharmacist 10/17/2018

## 2018-10-17 NOTE — Progress Notes (Signed)
PT Cancellation Note  Patient Details Name: Yolanda Turner MRN: [id number] DOB: 1925-10-30   Cancelled Treatment:    Reason Eval/Treat Not Completed: Medical issues which prohibited therapy Chart reviewed. Patient noted to have started a heparin drip this morning, as well as having up-trending troponin levels (2.15 at last check). PT will follow case acutely for medical appropriateness and evaluate as appropriate at a later time/date.  Myles Gip PT, DPT [phone] 10/17/2018, 1:04 PM

## 2018-10-17 NOTE — Progress Notes (Signed)
[facility] at [place] NAME: Yolanda Turner    MR#:  [id number]  DATE OF BIRTH:  1926/04/02  SUBJECTIVE:   Patient presented to the hospital with some vague leg pain and weakness and noted to be in A. fib with RVR.  Patient also noted to have an elevated troponin.  Patient has now converted to a sinus rhythm with some IV Cardizem.  No chest pains or shortness of breath any other associated symptoms.  REVIEW OF SYSTEMS:    Review of Systems  Unable to perform ROS: Mental acuity     Nutrition: Heart Heatlhy Tolerating Diet: Yes Tolerating PT: Await Eval   DRUG ALLERGIES:   Allergies  Allergen Reactions  . Penicillins Rash    Has patient had a PCN reaction causing immediate rash, facial/tongue/throat swelling, SOB or lightheadedness with hypotension: No Has patient had a PCN reaction causing severe rash involving mucus membranes or skin necrosis: No Has patient had a PCN reaction that required hospitalization: No Has patient had a PCN reaction occurring within the last 10 years: No If all of the above answers are "NO", then may proceed with Cephalosporin use.    VITALS:  Blood pressure (!) 150/61, pulse 76, temperature 97.9 F (36.6 C), temperature source Axillary, resp. rate 16, weight 33.8 kg, SpO2 90 %.  PHYSICAL EXAMINATION:   Physical Exam  GENERAL:  85 y.o.-year-old thin/cachectic patient lying in bed in no acute distress.  EYES: Pupils equal, round, reactive to light and accommodation. No scleral icterus. Extraocular muscles intact.  HEENT: Head atraumatic, normocephalic. Oropharynx and nasopharynx clear.  NECK:  Supple, no jugular venous distention. No thyroid enlargement, no tenderness.  LUNGS: Normal breath sounds bilaterally, no wheezing, rales, rhonchi. No use of accessory muscles of respiration.  CARDIOVASCULAR: S1, S2 normal. No murmurs, rubs, or gallops.  ABDOMEN: Soft, nontender, nondistended. Bowel sounds  present. No organomegaly or mass.  EXTREMITIES: No cyanosis, clubbing or edema b/l.    NEUROLOGIC: Cranial nerves II through XII are intact. No focal Motor or sensory deficits b/l.  Globally weak.  PSYCHIATRIC: The patient is alert and oriented x 1.  SKIN: No obvious rash, lesion, or ulcer.    LABORATORY PANEL:   CBC Recent Labs  Lab 10/17/18 0539  WBC 7.0  HGB 12.2  HCT 37.5  PLT 231   ------------------------------------------------------------------------------------------------------------------  Chemistries  Recent Labs  Lab 10/17/18 0539  NA 138  K 3.8  CL 98  CO2 29  GLUCOSE 151*  BUN 48*  CREATININE 0.92  CALCIUM 8.8*   ------------------------------------------------------------------------------------------------------------------  Cardiac Enzymes Recent Labs  Lab 10/17/18 0539  TROPONINI 2.15*   ------------------------------------------------------------------------------------------------------------------  RADIOLOGY:  No results found.   ASSESSMENT AND PLAN:   85 year old female with past medical history of previous CVA, coronary artery disease, history of uterine cancer, peripheral vascular disease, hypertension GERD diabetes history of carotid artery stenosis who presented to the hospital due to vague leg pain and was noted to be in atrial fibrillation with rapid ventricular response.  1.  Atrial fibrillation with rapid ventricular response- secondary to underlying leg pain.  Much improved after boluses of IV Cardizem and patient has now converted to a normal sinus rhythm. Continue low-dose metoprolol. -Continue heparin drip for now.  Appreciate cardiology input.    2.  Elevated troponin-likely in the setting of supply demand ischemia from A. fib with RVR.  Seen by cardiology and given patient's advanced age and frailty did not want to pursue  aggressive intervention with cardiac catheterization. -Patient presently denies any chest pain. -  Continue heparin drip for now, continue low-dose metoprolol.  Continue statin.  Await echocardiogram results.  Appreciate cardiology input.  3.  History of peripheral vascular disease-continue aspirin, Plavix, statin.    4.  Neuropathy-continue gabapentin.  5.  Hyperlipidemia-continue simvastatin.  Discussed plan of care with patient's daughter over the phone.  All the records are reviewed and case discussed with Care Management/Social Worker. Management plans discussed with the patient, family and they are in agreement.  CODE STATUS: Full code  DVT Prophylaxis: Heparin gtt  TOTAL TIME TAKING CARE OF THIS PATIENT: 30 minutes.   POSSIBLE D/C IN 2-3 DAYS, DEPENDING ON CLINICAL CONDITION.   Henreitta Leber M.D on 10/17/2018 at 1:55 PM  Between 7am to 6pm - Pager - [phone]  After 6pm go to [email or web address] - Proofreader  [facility]  Office  [phone]  CC: Primary care physician; Albina Billet, MD

## 2018-10-18 ENCOUNTER — Inpatient Hospital Stay
Admit: 2018-10-18 | Discharge: 2018-10-18 | Disposition: A | Discharge status: Home / Self Care | Payer: Medicare Other | Attending: Physician Assistant | Admitting: Physician Assistant

## 2018-10-18 DIAGNOSIS — Z515 Encounter for palliative care: Secondary | ICD-10-CM

## 2018-10-18 DIAGNOSIS — Z7189 Other specified counseling: Secondary | ICD-10-CM

## 2018-10-18 DIAGNOSIS — I4891 Unspecified atrial fibrillation: Secondary | ICD-10-CM

## 2018-10-18 LAB — CBC
HCT: 40.1 % (ref 36.0–46.0)
Hemoglobin: 13 g/dL (ref 12.0–15.0)
MCH: 29 pg (ref 26.0–34.0)
MCHC: 32.4 g/dL (ref 30.0–36.0)
MCV: 89.5 fL (ref 80.0–100.0)
Platelets: 234 10*3/uL (ref 150–400)
RBC: 4.48 MIL/uL (ref 3.87–5.11)
RDW: 12.4 % (ref 11.5–15.5)
WBC: 8.7 10*3/uL (ref 4.0–10.5)
nRBC: 0 % (ref 0.0–0.2)

## 2018-10-18 LAB — BASIC METABOLIC PANEL
Anion gap: 10 (ref 5–15)
BUN: 39 mg/dL — ABNORMAL HIGH (ref 8–23)
CO2: 24 mmol/L (ref 22–32)
Calcium: 9.1 mg/dL (ref 8.9–10.3)
Chloride: 106 mmol/L (ref 98–111)
Creatinine, Ser: 0.79 mg/dL (ref 0.44–1.00)
GFR calc Af Amer: >60 mL/min (ref >60)
GFR calc non Af Amer: >60 mL/min (ref >60)
Glucose, Bld: 147 mg/dL — ABNORMAL HIGH (ref 70–99)
Potassium: 3.7 mmol/L (ref 3.5–5.1)
Sodium: 140 mmol/L (ref 135–145)

## 2018-10-18 LAB — GLUCOSE, CAPILLARY
Glucose-Capillary: 148 mg/dL — ABNORMAL HIGH (ref 70–99)
Glucose-Capillary: 183 mg/dL — ABNORMAL HIGH (ref 70–99)
Glucose-Capillary: 141 mg/dL — ABNORMAL HIGH (ref 70–99)
Glucose-Capillary: 134 mg/dL — ABNORMAL HIGH (ref 70–99)

## 2018-10-18 LAB — HEPARIN LEVEL (UNFRACTIONATED): Heparin Unfractionated: 0.27 IU/mL — ABNORMAL LOW (ref 0.30–0.70)

## 2018-10-18 LAB — TROPONIN I: Troponin I: 0.4 ng/mL (ref <0.03)

## 2018-10-18 LAB — MAGNESIUM: Magnesium: 1.9 mg/dL (ref 1.7–2.4)

## 2018-10-18 MED ORDER — DILTIAZEM HCL 25 MG/5ML IV SOLN
5.0000 mg | Freq: Once | INTRAVENOUS | Status: AC
  Administered 2018-10-18: 5 mg via INTRAVENOUS
  Filled 2018-10-18: qty 5

## 2018-10-18 MED ORDER — HEPARIN BOLUS VIA INFUSION
500.0000 [IU] | Freq: Once | INTRAVENOUS | Status: AC
  Administered 2018-10-18: 500 [IU] via INTRAVENOUS
  Filled 2018-10-18: qty 500

## 2018-10-18 MED ORDER — DILTIAZEM HCL 25 MG/5ML IV SOLN
INTRAVENOUS | Status: AC
  Administered 2018-10-18: 10 mg via INTRAVENOUS
  Filled 2018-10-18: qty 5

## 2018-10-18 MED ORDER — DILTIAZEM HCL 25 MG/5ML IV SOLN
10.0000 mg | INTRAVENOUS | Status: AC
  Administered 2018-10-18: 10 mg via INTRAVENOUS

## 2018-10-18 NOTE — Progress Notes (Signed)
   [facility] at [facility] Hospital Day: 1 day Yolanda Turner is a 85 y.o. female past medical history of previous CVA, coronary artery disease, history of uterine cancer, peripheral vascular disease, hypertension GERD diabetes history of carotid artery stenosis who presented to the hospital due to vague leg pain and was noted to be in atrial fibrillation with rapid ventricular response.  Patient developed episodes of significant bradycardia with sinus pauses today.  Patient was a full code and had extensive discussion with the patient's daughters over the phone regarding the difference between DNR and full code.  Explained to them that given patient's frailty and advanced age it would be quite traumatic to do CPR and full resuscitation on her.  After those discussions the patient's family agreed to make her DNR.  Advance care planning discussed with patient  without additional Family at bedside. All questions in regards to overall condition and expected prognosis answered. The decision was made to change current code status  CODE STATUS: dnr Time spent: 16 minutes

## 2018-10-18 NOTE — Progress Notes (Signed)
HR 160s noted on telemetry.  Gardiner Barefoot, on call NP made aware.  Notified of frequent loose stools this shift.  Meds and labs with Cdiff culture and 'enteric precautions ordered.

## 2018-10-18 NOTE — Consult Note (Signed)
Consultation Note Date: 10/18/2018   Patient Name: Yolanda Turner  DOB: 12/28/1925  MRN: [id number]  Age / Sex: 85 y.o., female  PCP: Albina Billet, MD Referring Physician: Henreitta Leber, MD  Reason for Consultation: Establishing goals of care  HPI/Patient Profile: 85 y.o. female  with past medical history of CAD, T2DM, HTN, CVA, uterine cancer, PVD, GERD, and carotid stenosis admitted on 10/16/2018 with back/leg pain. Found to be in a fib RVR - given diltiazem.  Now her heart rate fluctuates from a fib RVR to NSR with frequent pauses. Clinically asymptomatic. PMT consulted for [place].  Clinical Assessment and Goals of Care: I have reviewed medical records including EPIC notes, labs and imaging, received report from RN, and then spoke to patient's daughter, [name], discuss diagnosis prognosis, [place], EOL wishes, disposition and options.  RN reports patient is alert, She has expressive aphasia and is difficult to understand but has been able to make her needs known. She is sitting up in bed and ate some lunch.   I introduced Palliative Medicine as specialized medical care for people living with serious illness. It focuses on providing relief from the symptoms and stress of a serious illness. The goal is to improve quality of life for both the patient and the family.  We discussed a brief life review of the patient. [name] shares that patient has raised ten children and worked her whole life. Tells me how tough and hard-working her mother is. Tells me about her [employer] faith.  As far as functional and nutritional status, she tells me that prior to her current illness she was very mobile and functional - got up and cooked everyday. She tells me she had trouble communicating but they believe she is cognitively intact. Good appetite.    We discussed her current illness and what it means in the larger context of her on-going co-morbidities.  Natural  disease trajectory and expectations at EOL were discussed. Discussed concerns about her HR and limited treatment options.   I attempted to elicit values and goals of care important to the patient.  [name] shares patient has been clear about not wanting to artificially prolong live - she is ready to die when it is "her time".  The difference between aggressive medical intervention and comfort care was considered in light of the patient's goals of care. The family has elected DNR status - would like to continue current care for now and see how the patient does. They understand she is at high risk for acute event. [name] tells me many times that her mother is tired and she is okay if this is the end of her life.   Hospice and Palliative Care services outpatient were explained and offered. Hospice was explained and discussed.  Apparently, patient's niece is coming to hospital later today since she is able to best understand her and hopes to communicate with her further about her wishes.   Questions and concerns were addressed. The family was encouraged to call with questions or concerns.   Primary Decision Maker NEXT OF KIN - children jointly    SUMMARY OF RECOMMENDATIONS   - continue current care - family settled on DNR status, patient has made her wishes clear to them - she would never want aggressive interventions like feeding tubes or intubation - family is accepting that patient may be near end of life  Code Status/Advance Care Planning:  DNR  Additional Recommendations (Limitations, Scope, Preferences):  No Artificial Feeding  Psycho-social/Spiritual:  Desire for further Chaplaincy support:no  Additional Recommendations: Education on Hospice  Prognosis:   Unable to determine  Discharge Planning: To Be Determined      Primary Diagnoses: Present on Admission: . Hypertension . CAD (coronary artery disease) . Atrial fibrillation with RVR ([place])   I have reviewed the  medical record, interviewed the patient and family, and examined the patient. The following aspects are pertinent.  Past Medical History:  Diagnosis Date  . Carotid artery stenosis   . Carotid artery stenosis   . Collagen vascular disease ([place])   . Coronary artery disease   . Diabetes mellitus without complication (HCC)    diet control  . GERD (gastroesophageal reflux disease)   . Hypertension   . Myocardial infarction ([place])   . Osteoporosis   . Peripheral vascular disease ([place])   . Pneumonia   . Renal artery stenosis ([place])   . Status post double vessel coronary artery bypass 2005  . Stroke ([place])   . Uterine cancer [facility])    Social History   Socioeconomic History  . Marital status: Widowed    Spouse name: Not on file  . Number of children: Not on file  . Years of education: Not on file  . Highest education level: Not on file  Occupational History  . Not on file  Social Needs  . Financial resource strain: Not on file  . Food insecurity:    Worry: Not on file    Inability: Not on file  . Transportation needs:    Medical: Not on file    Non-medical: Not on file  Tobacco Use  . Smoking status: Former Research scientist (life sciences)  . Smokeless tobacco: Never Used  Substance and Sexual Activity  . Alcohol use: No  . Drug use: No  . Sexual activity: Not on file  Lifestyle  . Physical activity:    Days per week: Not on file    Minutes per session: Not on file  . Stress: Not on file  Relationships  . Social connections:    Talks on phone: Not on file    Gets together: Not on file    Attends religious service: Not on file    Active member of club or organization: Not on file    Attends meetings of clubs or organizations: Not on file    Relationship status: Not on file  Other Topics Concern  . Not on file  Social History Narrative  . Not on file   Family History  Problem Relation Age of Onset  . Lung cancer Daughter    Scheduled Meds: . aspirin EC  81 mg Oral Daily  . clopidogrel   75 mg Oral QHS  . feeding supplement (ENSURE ENLIVE)  237 mL Oral BID BM  . gabapentin  300 mg Oral QHS  . insulin aspart  0-5 Units Subcutaneous QHS  . insulin aspart  0-9 Units Subcutaneous TID WC  . simvastatin  40 mg Oral QHS  . sodium chloride flush  3 mL Intravenous Q12H   Continuous Infusions: . sodium chloride Stopped (10/18/18 0902)   PRN Meds:.sodium chloride, acetaminophen **OR** acetaminophen, ketorolac, ondansetron **OR** ondansetron (ZOFRAN) IV Allergies  Allergen Reactions  . Penicillins Rash    Has patient had a PCN reaction causing immediate rash, facial/tongue/throat swelling, SOB or lightheadedness with hypotension: No Has patient had a PCN reaction causing severe rash involving mucus membranes or skin necrosis: No Has patient had a PCN reaction that required hospitalization: No Has patient had a PCN  reaction occurring within the last 10 years: No If all of the above answers are "NO", then may proceed with Cephalosporin use.    Vital Signs: BP (!) 107/55   Pulse (!) 51   Temp 98.6 F (37 C) (Oral)   Resp 20   Wt 33.8 kg   SpO2 98%   BMI 14.55 kg/m  Pain Scale: PAINAD   Pain Score: 0-No pain   SpO2: SpO2: 98 % O2 Device:SpO2: 98 % O2 Flow Rate: .O2 Flow Rate (L/min): 1 L/min  IO: Intake/output summary:   Intake/Output Summary (Last 24 hours) at 10/18/2018 1516 Last data filed at 10/18/2018 1218 Gross per 24 hour  Intake 425.7 ml  Output 750 ml  Net -324.3 ml    LBM: Last BM Date: 10/17/18 Baseline Weight: Weight: 33.8 kg Most recent weight: Weight: 33.8 kg     Palliative Assessment/Data: PPS 40%    The above conversation was completed via telephone due to the visitor restrictions during the COVID-19 pandemic. Thorough chart review and discussion with necessary members of the care team was completed as part of assessment. All issues were discussed and addressed but no physical exam was performed.  Time Total: 70 minutes Greater than 50%  of this  time was spent counseling and coordinating care related to the above assessment and plan.  Juel Burrow, DNP, AGNP-C Palliative Medicine Team [phone] Pager: [phone]

## 2018-10-18 NOTE — Plan of Care (Signed)
Poor po intake, declines meal trays.  Meds given with spoonfuls applesauce.

## 2018-10-18 NOTE — Progress Notes (Signed)
ANTICOAGULATION CONSULT NOTE - Initial Consult  Pharmacy Consult for Heparin  Indication: chest pain/ACS  Allergies  Allergen Reactions  . Penicillins Rash    Has patient had a PCN reaction causing immediate rash, facial/tongue/throat swelling, SOB or lightheadedness with hypotension: No Has patient had a PCN reaction causing severe rash involving mucus membranes or skin necrosis: No Has patient had a PCN reaction that required hospitalization: No Has patient had a PCN reaction occurring within the last 10 years: No If all of the above answers are "NO", then may proceed with Cephalosporin use.    Patient Measurements: Weight: 74 lb 8.3 oz (33.8 kg) Heparin Dosing Weight:  33.8 kg   Vital Signs: Temp: 98.6 F (37 C) (05/07 0342) Temp Source: Oral (05/07 0342) BP: 160/58 (05/07 0342) Pulse Rate: 72 (05/07 0342)  Labs: Recent Labs    10/16/18 2027 10/17/18 0028 10/17/18 0204 10/17/18 0539 10/17/18 1003 10/17/18 1815 10/18/18 0330  HGB 14.3  --   --  12.2  --   --  13.0  HCT 43.6  --   --  37.5  --   --  40.1  PLT 316  --   --  231  --   --  234  APTT  --   --  27  --   --   --   --   LABPROT  --   --  14.0  --   --   --   --   INR  --   --  1.1  --   --   --   --   HEPARINUNFRC  --   --   --   --  0.38 0.27* 0.27*  CREATININE 1.04*  --   --  0.92  --   --   --   TROPONINI 0.09* 0.38*  --  2.15*  --   --  0.40*    Estimated Creatinine Clearance: 21.7 mL/min (by C-G formula based on SCr of 0.92 mg/dL).   Medical History: Past Medical History:  Diagnosis Date  . Carotid artery stenosis   . Carotid artery stenosis   . Collagen vascular disease ([place])   . Coronary artery disease   . Diabetes mellitus without complication (HCC)    diet control  . GERD (gastroesophageal reflux disease)   . Hypertension   . Myocardial infarction ([place])   . Osteoporosis   . Peripheral vascular disease ([place])   . Pneumonia   . Renal artery stenosis ([place])   . Status post double vessel  coronary artery bypass 2005  . Stroke ([place])   . Uterine cancer (HCC)     Medications:  Medications Prior to Admission  Medication Sig Dispense Refill Last Dose  . clopidogrel (PLAVIX) 75 MG tablet Take 75 mg by mouth at bedtime.   unknown at unknown  . gabapentin (NEURONTIN) 300 MG capsule Take 300 mg by mouth at bedtime.   unknown at unknown  . lisinopril (PRINIVIL,ZESTRIL) 10 MG tablet Take 10 mg by mouth at bedtime.   1 unknown at unknown  . metoprolol succinate (TOPROL-XL) 50 MG 24 hr tablet Take 25 mg by mouth at bedtime.   5 unknown at unknown  . simvastatin (ZOCOR) 40 MG tablet Take 40 mg by mouth at bedtime.   5 unknown at unknown    Assessment: Patient admitted for back pain was found to be in afib w/ RVR and PVC's w/ wide QRS complexes/LBBB. Patient has a h/o of afib not on anticoagulation  PTA. Initial trop in ED was 0.09 now up to 0.38. Patient is being started on heparin drip for possible NSTEMI. Patient was given a 2000 unit bolus followed by an infusion of 500 units/hr. There was a drop in Hgb and plt Continue to monitor.   5/6 @1003  HL 0.38 - continue rate.   Goal of Therapy:  Heparin level 0.3-0.7 units/ml Monitor platelets by anticoagulation protocol: Yes   Plan:  05/07 @ 0500 HL 0.27 subtherapeutic. Will rebolus w/ heparin 500 units IV x 1 and will increase rate to 650 units/hr and will recheck HL @ 1300, CBC stable, trops coming down will continue to monitor.  Tobie Lords, PharmD, BCPS Clinical Pharmacist 10/18/2018

## 2018-10-18 NOTE — Progress Notes (Signed)
MD notified. Pt is currently a-fib sustaining 110-120s. MD instructs RN to contact cardiology.  I will continue to assess.

## 2018-10-18 NOTE — Progress Notes (Signed)
[facility] [place] NAME: Yolanda Turner    MR#:  [id number]  DATE OF BIRTH:  [birth year]  SUBJECTIVE:   Patient having episodes of tachybradycardia syndrome today.  Her heart rates of fluttering as high as 140s to 150s and then she is converting back to sinus rhythm and then having 5-second pauses at times.  She is clinically asymptomatic.  No other acute events overnight.  REVIEW OF SYSTEMS:    Review of Systems  Unable to perform ROS: Mental acuity     Nutrition: Heart Heatlhy Tolerating Diet: Yes Tolerating PT: Await Eval   DRUG ALLERGIES:   Allergies  Allergen Reactions  . Penicillins Rash    Has patient had a PCN reaction causing immediate rash, facial/tongue/throat swelling, SOB or lightheadedness with hypotension: No Has patient had a PCN reaction causing severe rash involving mucus membranes or skin necrosis: No Has patient had a PCN reaction that required hospitalization: No Has patient had a PCN reaction occurring within the last 10 years: No If all of the above answers are "NO", then may proceed with Cephalosporin use.    VITALS:  Blood pressure (!) 107/55, pulse (!) 51, temperature 98.6 F (37 C), temperature source Oral, resp. rate 20, weight 33.8 kg, SpO2 98 %.  PHYSICAL EXAMINATION:   Physical Exam  GENERAL:  85 y.o.-year-old thin/cachectic patient lying in bed in no acute distress.  EYES: Pupils equal, round, reactive to light and accommodation. No scleral icterus. Extraocular muscles intact.  HEENT: Head atraumatic, normocephalic. Oropharynx and nasopharynx clear.  NECK:  Supple, no jugular venous distention. No thyroid enlargement, no tenderness.  LUNGS: Normal breath sounds bilaterally, no wheezing, rales, rhonchi. No use of accessory muscles of respiration.  CARDIOVASCULAR: S1, S2 normal. No murmurs, rubs, or gallops.  ABDOMEN: Soft, nontender, nondistended. Bowel sounds present. No organomegaly or mass.   EXTREMITIES: No cyanosis, clubbing or edema b/l.    NEUROLOGIC: Cranial nerves II through XII are intact. No focal Motor or sensory deficits b/l.  Globally weak.  PSYCHIATRIC: The patient is alert and oriented x 1.  SKIN: No obvious rash, lesion, or ulcer.    LABORATORY PANEL:   CBC Recent Labs  Lab 10/18/18 0330  WBC 8.7  HGB 13.0  HCT 40.1  PLT 234   ------------------------------------------------------------------------------------------------------------------  Chemistries  Recent Labs  Lab 10/18/18 0515  NA 140  K 3.7  CL 106  CO2 24  GLUCOSE 147*  BUN 39*  CREATININE 0.79  CALCIUM 9.1  MG 1.9   ------------------------------------------------------------------------------------------------------------------  Cardiac Enzymes Recent Labs  Lab 10/18/18 0330  TROPONINI 0.40*   ------------------------------------------------------------------------------------------------------------------  RADIOLOGY:  No results found.   ASSESSMENT AND PLAN:   85 year old female with past medical history of previous CVA, coronary artery disease, history of uterine cancer, peripheral vascular disease, hypertension GERD diabetes history of carotid artery stenosis who presented to the hospital due to vague leg pain and was noted to be in atrial fibrillation with rapid ventricular response.  1.  Atrial fibrillation with rapid ventricular response- secondary to underlying leg pain.   -Patient having episodes of rapid rates and then slowing down to normal sinus rhythm in the 70s to 80s and also having 5 to 6-second pauses.  She is clinically asymptomatic. - Discussed with cardiology extensively and they would like to do minimal intervention given her advanced age and frailty.  Avoid rate controlling meds for now. -Heparin drip has been discontinued.  2.  Elevated troponin-likely in  the setting of supply demand ischemia from A. fib with RVR.  Seen by cardiology and given  patient's advanced age and frailty did not want to pursue aggressive intervention with cardiac catheterization. -Patient presently denies any chest pain. -Await echocardiogram results.  Metoprolol discontinued due to episodes of bradycardia and also pauses.  3.  History of peripheral vascular disease-continue aspirin, Plavix, statin.    4.  Neuropathy-continue gabapentin.  5.  Hyperlipidemia-continue simvastatin.   All the records are reviewed and case discussed with Care Management/Social Worker. Management plans discussed with the patient, family and they are in agreement.  CODE STATUS: DNR  DVT Prophylaxis: Lovenox  TOTAL TIME TAKING CARE OF THIS PATIENT: 30 minutes.   POSSIBLE D/C IN 2-3 DAYS, DEPENDING ON CLINICAL CONDITION.   Henreitta Leber M.D on 10/18/2018 at 2:47 PM  Between 7am to 6pm - Pager - [phone]  After 6pm go to [email or web address] - Proofreader  [facility]  Office  [phone]  CC: Primary care physician; Albina Billet, MD

## 2018-10-18 NOTE — Progress Notes (Addendum)
Was contacted by a family member of patient, Denita Lung. She wanted to come to visit, stated was told patient was very sick. Called to speak with Thomas Hoff, pt's RN. To find out what patient's status was. She stated patient was having increased HR and had been changed to a DNR status.I asked if she was dying and what needed to happen about visitors. Tammy states patient has very large family and thought Zachary George would be the person to be able to visit. Called Ms. Aggie Moats [phone], asked if she would want to visit and be able to visit. She stated she wanted to have an unlimited visitation, I told her she could stay 30 minutes. She initially did not want to agree to that, but after talking for a little bit, she said she understands. Also stated, there were many people that wanted to see her, I explained she would be the only person allowed to visit. She kept stating "I understand about this corona virus, and your rules, but I don't care, I want to stay with my momma." Explained again could not do an unlimited visitation or multiple visitors. She stated she would be here this afternoon. Explained to Tammy the conversation I had with Ms. Hobbs.  While speaking with Ms. Radene Ou Holder hung up

## 2018-10-18 NOTE — Progress Notes (Signed)
PT Cancellation Note  Patient Details Name: Yolanda Turner MRN: [id number] DOB: Aug 21, 1925   Cancelled Treatment:    Reason Eval/Treat Not Completed: Medical issues which prohibited therapy(HR up to 130-140 bpm at rest. Too close to age predicted HR of 130 bpm.  Was able to obtain detailed history from granddaughter Alyse Low. )  Attempted initial evaluation but medical issues prevented completion of objective exam at this time.  Patient has expressive difficulties due to previous stroke and is unable to provide history. Granddaughter Margreta [facility]) is present and able to give detailed history. Patient lives with her granddaughter [name] who assists her as neccessary and helps with the care of pt's son Herbie Baltimore and great granddaughter Erasmo Downer, who both have cerebral palsy. Erasmo Downer is bedbound and pt's granddaughter Hassan Rowan comes to care for her daily. Herbie Baltimore is cared for by pts's daughter [name]. Alyse Low states that family would be able to provide 24/7 care to patient if she were to return home.   Patient lives in a single story home with a ramped entrance with two hand rails that can be reached at the same time. She has access to a handicapped equipped bathroom including a rolling shower/tub. She has a shower/tub chair, BSC, quad cane, and RW available to her at home. Prior to hospitalization, she ambulated household distances with RW. She was I with dressing and bathing and liked to prepare her own meals and wash dishes. She needed help getting groceries and does not drive.   Everlean Alstrom. Graylon Good, PT, DPT 10/18/18, 4:27 PM

## 2018-10-18 NOTE — Progress Notes (Signed)
Pt is a-fib with RVR, juntional, and NSR this shift.Pt is having pauses and ringing out systole.  Cardizem is given last shift and BP is now low. MD and Cardiology are notified of patient's condition. Pt is asymptomatic. Family makes patient a DNR this shift. One family member is allowed to visit with patient. By shift end pt is NSR in the 90s.

## 2018-10-18 NOTE — Progress Notes (Signed)
[facility] Cardiology  SUBJECTIVE: The patient denies chest pain, shortness of breath or palpitations. She is no longer having loose stools. She points to her left foot as being sore. She voices that she wants to eat, and go home.   Vitals:   10/18/18 0716 10/18/18 0732 10/18/18 0747 10/18/18 0801  BP: 112/73 111/78 105/61 (!) 90/57  Pulse: (!) 144 (!) 123 (!) 139 72  Resp:      Temp:      TempSrc:      SpO2: 100% 98% 96% 99%  Weight:         Intake/Output Summary (Last 24 hours) at 10/18/2018 0831 Last data filed at 10/18/2018 0343 Gross per 24 hour  Intake 571.13 ml  Output 400 ml  Net 171.13 ml      PHYSICAL EXAM  General: Frail, elderly, thin lady lying in bed in no acute distress. Grimacing at times HEENT:  Normocephalic and atramatic Neck:  No JVD.  Lungs: normal effort of breathing on supplemental oxygen, no wheezing Heart: irregularly irregular Abdomen: nondistended Msk:  Cachectic, gait not assessed. Extremities: No clubbing, cyanosis or edema.   Neuro: Alert, oriented. expressive aphasia    LABS: Basic Metabolic Panel: Recent Labs    10/17/18 0539 10/18/18 0515  NA 138 140  K 3.8 3.7  CL 98 106  CO2 29 24  GLUCOSE 151* 147*  BUN 48* 39*  CREATININE 0.92 0.79  CALCIUM 8.8* 9.1   Liver Function Tests: No results for input(s): AST, ALT, ALKPHOS, BILITOT, PROT, ALBUMIN in the last 72 hours. No results for input(s): LIPASE, AMYLASE in the last 72 hours. CBC: Recent Labs    10/16/18 2027 10/17/18 0539 10/18/18 0330  WBC 10.5 7.0 8.7  NEUTROABS 8.3*  --   --   HGB 14.3 12.2 13.0  HCT 43.6 37.5 40.1  MCV 87.9 88.4 89.5  PLT 316 231 234   Cardiac Enzymes: Recent Labs    10/17/18 0028 10/17/18 0539 10/18/18 0330  TROPONINI 0.38* 2.15* 0.40*   BNP: Invalid input(s): POCBNP D-Dimer: No results for input(s): DDIMER in the last 72 hours. Hemoglobin A1C: No results for input(s): HGBA1C in the last 72 hours. Fasting Lipid Panel: No results for  input(s): CHOL, HDL, LDLCALC, TRIG, CHOLHDL, LDLDIRECT in the last 72 hours. Thyroid Function Tests: No results for input(s): TSH, T4TOTAL, T3FREE, THYROIDAB in the last 72 hours.  Invalid input(s): FREET3 Anemia Panel: No results for input(s): VITAMINB12, FOLATE, FERRITIN, TIBC, IRON, RETICCTPCT in the last 72 hours.  No results found.  TELEMETRY: atrial fibrillation, rates 120s-130s bpm  ASSESSMENT AND PLAN:  Principal Problem:   Atrial fibrillation with RVR (HCC) Active Problems:   Hypertension   Diabetes mellitus without complication (HCC)   CAD (coronary artery disease)    1. Atrial fibrillation with RVR, with a history of such, initially converted back to normal sinus rhythm after IV Cardizem. After episodes of loose stools yesterday, the patient returned to atrial fibrillation with RVR with rates in 120-130s. Patient appears asymptomatic. On aspirin and Plavix per patient's cardiologist. 2. Pauses greater than 5 seconds per telemetry monitoring, occurring before and after IV Cardizem  3. Elevated troponin, in the absence of chest pain, trending down. 0.09- 0.38- 2.15- 0.40. Patient poor candidate for cardiac catheterization. Has been on heparin drip. 4. Known CAD, status post CABG 2003, currently without chest pain 5. Loose stools, samples sent for C.diff culture  Recommendations: 1. Discontinue heparin drip 2. Continue aspirin and Plavix, deferring chronic anticoagulation 3.  Defer cardiac catheterization in light of patient's advanced age, frailty, and in the absence of chest pain. 4. 2D echocardiogram 5. Hold metoprolol for now in light of hypotension and pauses 6. Defer antiarrhythmics as patient is asymptomatic and risks outweigh the benefits 7. Recommend imaging of leg for evaluation of pain    Clabe Seal, PA-C 10/18/2018 8:31 AM   The plan was made in collaboration with Dr. Saralyn Pilar

## 2018-10-18 NOTE — Progress Notes (Signed)
*  PRELIMINARY RESULTS* Echocardiogram 2D Echocardiogram has been performed.  Yolanda Turner 10/18/2018, 3:18 PM

## 2018-10-18 NOTE — Progress Notes (Signed)
PT Cancellation Note  Patient Details Name: RHILEY TARVER MRN: [id number] DOB: 12/16/1925   Cancelled Treatment:    Reason Eval/Treat Not Completed: Medical issues which prohibited therapy;Patient at procedure or test/unavailable(Patient unavailable undergoing testing. Will re-attempt to evaluate as appropriate at a later time/date. )  Everlean Alstrom. Graylon Good, PT, DPT 10/18/18, 2:58 PM

## 2018-10-18 NOTE — Progress Notes (Signed)
Notified per CCMD of pauses noted on telemetry.  Reports second pause >5 second.  This nurse not notified of previous pauses.  Pt condition without changes in assessment noted at this time.

## 2018-10-19 LAB — BASIC METABOLIC PANEL
Anion gap: 9 (ref 5–15)
BUN: 38 mg/dL — ABNORMAL HIGH (ref 8–23)
CO2: 25 mmol/L (ref 22–32)
Calcium: 9.3 mg/dL (ref 8.9–10.3)
Chloride: 105 mmol/L (ref 98–111)
Creatinine, Ser: 0.78 mg/dL (ref 0.44–1.00)
GFR calc Af Amer: >60 mL/min (ref >60)
GFR calc non Af Amer: >60 mL/min (ref >60)
Glucose, Bld: 122 mg/dL — ABNORMAL HIGH (ref 70–99)
Potassium: 3.6 mmol/L (ref 3.5–5.1)
Sodium: 139 mmol/L (ref 135–145)

## 2018-10-19 LAB — GLUCOSE, CAPILLARY
Glucose-Capillary: 373 mg/dL — ABNORMAL HIGH (ref 70–99)
Glucose-Capillary: 104 mg/dL — ABNORMAL HIGH (ref 70–99)
Glucose-Capillary: 173 mg/dL — ABNORMAL HIGH (ref 70–99)

## 2018-10-19 LAB — CBC
HCT: 38.6 % (ref 36.0–46.0)
Hemoglobin: 12.8 g/dL (ref 12.0–15.0)
MCH: 28.7 pg (ref 26.0–34.0)
MCHC: 33.2 g/dL (ref 30.0–36.0)
MCV: 86.5 fL (ref 80.0–100.0)
Platelets: 212 10*3/uL (ref 150–400)
RBC: 4.46 MIL/uL (ref 3.87–5.11)
RDW: 12.8 % (ref 11.5–15.5)
WBC: 9.8 10*3/uL (ref 4.0–10.5)
nRBC: 0 % (ref 0.0–0.2)

## 2018-10-19 LAB — ECHOCARDIOGRAM COMPLETE: Weight: 1192.25 oz

## 2018-10-19 LAB — MAGNESIUM: Magnesium: 2 mg/dL (ref 1.7–2.4)

## 2018-10-19 MED ORDER — HEPARIN SODIUM (PORCINE) 5000 UNIT/ML IJ SOLN: 5000.0000 [IU] | Freq: Three times a day (TID) | INTRAMUSCULAR | Status: DC

## 2018-10-19 MED ORDER — METOPROLOL SUCCINATE ER 25 MG PO TB24: 12.5000 mg | ORAL_TABLET | Freq: Every day | ORAL | 1 refills | Status: AC

## 2018-10-19 MED ORDER — ASPIRIN EC 81 MG PO TBEC: 81.0000 mg | DELAYED_RELEASE_TABLET | Freq: Every day | ORAL | Status: DC

## 2018-10-19 NOTE — Progress Notes (Addendum)
[facility] Cardiology  SUBJECTIVE:   Received sign out from Dr. Saralyn Pilar. Patient had initially presented to the ED for back pain. In the ED, she was found to be in A-fib with RVR, rate in the 160s. She had a troponin elevation up to 0.38 but she denies chest pain or shortness of breath. She is DNR. Her rate is now controlled in the 70s.   Spoke to the patient this morning. She had been sleeping comfortably prior to my evaluation. She reports feeling well this morning. She denies having any chest pain, shortness of breath, or palpitations. She states that she would like to return home.    Vitals:   10/18/18 1911 10/18/18 2015 10/19/18 0422 10/19/18 0836  BP:  (!) 110/44 127/65 (!) 128/58  Pulse: 84 84 74 68  Resp:  20 20 16   Temp:  97.6 F (36.4 C) 98.2 F (36.8 C)   TempSrc:  Oral Oral   SpO2:  100% 100% 94%  Weight:         Intake/Output Summary (Last 24 hours) at 10/19/2018 4010 Last data filed at 10/19/2018 0431 Gross per 24 hour  Intake -  Output 300 ml  Net -300 ml     PHYSICAL EXAM  General: Thin, frail appearing. Well developed, well nourished, in no acute distress HEENT:  Normocephalic and atramatic Neck:  No JVD.  Lungs: Clear bilaterally to auscultation and percussion. Heart: Irregularly irregular, normal rate. No gallops or murmurs.  Extremities: No clubbing, cyanosis or edema.   Neuro: Alert and oriented X 3. Psych:  Good affect, responds appropriately   LABS: Basic Metabolic Panel: Recent Labs    10/18/18 0515 10/19/18 0716  NA 140 139  K 3.7 3.6  CL 106 105  CO2 24 25  GLUCOSE 147* 122*  BUN 39* 38*  CREATININE 0.79 0.78  CALCIUM 9.1 9.3  MG 1.9 2.0   Liver Function Tests: No results for input(s): AST, ALT, ALKPHOS, BILITOT, PROT, ALBUMIN in the last 72 hours. No results for input(s): LIPASE, AMYLASE in the last 72 hours. CBC: Recent Labs    10/16/18 2027  10/18/18 0330 10/19/18 0716  WBC 10.5   < > 8.7 9.8  NEUTROABS 8.3*  --   --   --   HGB  14.3   < > 13.0 12.8  HCT 43.6   < > 40.1 38.6  MCV 87.9   < > 89.5 86.5  PLT 316   < > 234 212   < > = values in this interval not displayed.   Cardiac Enzymes: Recent Labs    10/17/18 0028 10/17/18 0539 10/18/18 0330  TROPONINI 0.38* 2.15* 0.40*   BNP: Invalid input(s): POCBNP D-Dimer: No results for input(s): DDIMER in the last 72 hours. Hemoglobin A1C: No results for input(s): HGBA1C in the last 72 hours. Fasting Lipid Panel: No results for input(s): CHOL, HDL, LDLCALC, TRIG, CHOLHDL, LDLDIRECT in the last 72 hours. Thyroid Function Tests: No results for input(s): TSH, T4TOTAL, T3FREE, THYROIDAB in the last 72 hours.  Invalid input(s): FREET3 Anemia Panel: No results for input(s): VITAMINB12, FOLATE, FERRITIN, TIBC, IRON, RETICCTPCT in the last 72 hours.  No results found.   ASSESSMENT AND PLAN: 85 year old female referred for evaluation of atrial fibrillation with rapid ventricular rate. Rate now controlled. Patient reports feeling well without chest pain or shortness of breath.   1.  Atrial fibrillation with rapid ventricular rate, likely due to and leg pain, converted back to sinus rhythm, currently on aspirin and  clopidogrel per Dr. Nehemiah Massed, defers chronic anticoagulation in light of patient's overall frailty and risk off falling 2.  Elevated troponin, in the absence of chest pain, and patient who would be a poor candidate for cardiac catheterization, currently on heparin drip 3.  Known CAD, status post CABG 2003, currently without chest pain   A-fib with RVR: Rate now in the 70s.   1.  Agree with overall current therapy 2.  Continue aspirin and clopidogrel 3.  Defer chronic anticoagulation 4.  Add metoprolol tartrate 12.5 mg twice daily 5. No further cardiac diagnostics indicated at this time  The patient's history and exam findings were discussed with Dr. Nehemiah Massed. The plan was made in conjunction with Dr. Nehemiah Massed.  Yolanda Odor, PA-C 10/19/2018 9:27  AM Agree with above Yolanda Royals MD [facility] [place]

## 2018-10-19 NOTE — Discharge Summary (Signed)
[facility] [place] NAME: Yolanda Turner    MR#:  [id number]  DATE OF BIRTH:  03-05-1926  DATE OF ADMISSION:  10/16/2018 ADMITTING PHYSICIAN: Lance Coon, MD  DATE OF DISCHARGE: 10/19/2018  PRIMARY CARE PHYSICIAN: Albina Billet, MD    ADMISSION DIAGNOSIS:  Back Pain  DISCHARGE DIAGNOSIS:  Principal Problem:   Atrial fibrillation with RVR ([place]) Active Problems:   Hypertension   Diabetes mellitus without complication (HCC)   CAD (coronary artery disease)   Goals of care, counseling/discussion   Palliative care by specialist   SECONDARY DIAGNOSIS:   Past Medical History:  Diagnosis Date  . Carotid artery stenosis   . Carotid artery stenosis   . Collagen vascular disease ([place])   . Coronary artery disease   . Diabetes mellitus without complication (HCC)    diet control  . GERD (gastroesophageal reflux disease)   . Hypertension   . Myocardial infarction ([place])   . Osteoporosis   . Peripheral vascular disease ([place])   . Pneumonia   . Renal artery stenosis ([place])   . Status post double vessel coronary artery bypass 2005  . Stroke ([place])   . Uterine cancer [facility])     HOSPITAL COURSE:   85 year old female with past medical history of previous CVA, coronary artery disease, history of uterine cancer, peripheral vascular disease, hypertension GERD diabetes history of carotid artery stenosis who presented to the hospital due to vague leg pain and was noted to be in atrial fibrillation with rapid ventricular response.  1.  Atrial fibrillation with rapid ventricular response- secondary to underlying leg pain.   -Patient initially was given pulse doses of IV Cardizem and it improved patient's heart rate and is currently stable. -Patient was seen by cardiology and given her advanced age and frailty they did not want to be aggressive in the patient's treatment. -Patient is not being discharged on long-term anticoagulation but will continue  aspirin and Plavix. -she will cont. Low dose Toprol for rate control.  -While in the hospital patient developed some sinus pauses and bradycardia and therefore patient's Toprol dose was adjusted and lowered prior to discharge.  2.  Left leg pain- likely musculoskeletal in nature. - Suspected to be possible sciatica.  Continue supportive care with pain control with Tylenol Motrin as needed.  3.  Elevated troponin-likely in the setting of supply demand ischemia from A. fib with RVR.  Seen by cardiology and given patient's advanced age and frailty did not want to pursue aggressive intervention with cardiac catheterization. -Echo showing severe LV dysfunction with EF 20 to 25%.  Cont. low-dose Toprol.  As per cardiology no further acute intervention as mentioned above  4.  History of peripheral vascular disease-continue aspirin, Plavix, statin.    5.  Neuropathy-continue gabapentin.  6.  Hyperlipidemia-continue simvastatin.  Seen by physical therapy and they recommend SNF/short-term rehab but after speaking to the patient's daughter they do not want the patient going there and would prefer discharge home and therefore patient is being discharged home with home health services.  DISCHARGE CONDITIONS:   Stable  CONSULTS OBTAINED:  Treatment Team:  Isaias Cowman, MD  DRUG ALLERGIES:   Allergies  Allergen Reactions  . Penicillins Rash    Has patient had a PCN reaction causing immediate rash, facial/tongue/throat swelling, SOB or lightheadedness with hypotension: No Has patient had a PCN reaction causing severe rash involving mucus membranes or skin necrosis: No Has patient had a PCN reaction that required  hospitalization: No Has patient had a PCN reaction occurring within the last 10 years: No If all of the above answers are "NO", then may proceed with Cephalosporin use.    DISCHARGE MEDICATIONS:   Allergies as of 10/19/2018      Reactions   Penicillins Rash   Has patient  had a PCN reaction causing immediate rash, facial/tongue/throat swelling, SOB or lightheadedness with hypotension: No Has patient had a PCN reaction causing severe rash involving mucus membranes or skin necrosis: No Has patient had a PCN reaction that required hospitalization: No Has patient had a PCN reaction occurring within the last 10 years: No If all of the above answers are "NO", then may proceed with Cephalosporin use.      Medication List    STOP taking these medications   lisinopril 10 MG tablet Commonly known as:  ZESTRIL     TAKE these medications   aspirin EC 81 MG tablet Take 1 tablet (81 mg total) by mouth daily.   clopidogrel 75 MG tablet Commonly known as:  PLAVIX Take 75 mg by mouth at bedtime.   gabapentin 300 MG capsule Commonly known as:  NEURONTIN Take 300 mg by mouth at bedtime.   metoprolol succinate 25 MG 24 hr tablet Commonly known as:  TOPROL-XL Take 0.5 tablets (12.5 mg total) by mouth daily. What changed:    medication strength  how much to take  when to take this   simvastatin 40 MG tablet Commonly known as:  ZOCOR Take 40 mg by mouth at bedtime.         DISCHARGE INSTRUCTIONS:   DIET:  Cardiac diet  DISCHARGE CONDITION:  Stable  ACTIVITY:  Activity as tolerated  OXYGEN:  Home Oxygen: No.   Oxygen Delivery: room air  DISCHARGE LOCATION:  Home with Home Health PT, RN, Aide, Social work.    If you experience worsening of your admission symptoms, develop shortness of breath, life threatening emergency, suicidal or homicidal thoughts you must seek medical attention immediately by calling 911 or calling your MD immediately  if symptoms less severe.  You Must read complete instructions/literature along with all the possible adverse reactions/side effects for all the Medicines you take and that have been prescribed to you. Take any new Medicines after you have completely understood and accpet all the possible adverse  reactions/side effects.   Please note  You were cared for by a hospitalist during your hospital stay. If you have any questions about your discharge medications or the care you received while you were in the hospital after you are discharged, you can call the unit and asked to speak with the hospitalist on call if the hospitalist that took care of you is not available. Once you are discharged, your primary care physician will handle any further medical issues. Please note that NO REFILLS for any discharge medications will be authorized once you are discharged, as it is imperative that you return to your primary care physician (or establish a relationship with a primary care physician if you do not have one) for your aftercare needs so that they can reassess your need for medications and monitor your lab values.     Today   No acute events  Overnight. Bradycardia sinus pauses has resolved.  No chest pain. Pt is more awake and alert. Will d/c home today.   VITAL SIGNS:  Blood pressure (!) 128/58, pulse 68, temperature 98.2 F (36.8 C), temperature source Oral, resp. rate 16, weight 33.8 kg, SpO2  96 %.  I/O:    Intake/Output Summary (Last 24 hours) at 10/19/2018 1426 Last data filed at 10/19/2018 0431 Gross per 24 hour  Intake -  Output 300 ml  Net -300 ml    PHYSICAL EXAMINATION:   GENERAL:  85 y.o.-year-old thin/cachectic patient lying in bed in no acute distress.  EYES: Pupils equal, round, reactive to light and accommodation. No scleral icterus. Extraocular muscles intact.  HEENT: Head atraumatic, normocephalic. Oropharynx and nasopharynx clear.  NECK:  Supple, no jugular venous distention. No thyroid enlargement, no tenderness.  LUNGS: Normal breath sounds bilaterally, no wheezing, rales, rhonchi. No use of accessory muscles of respiration.  CARDIOVASCULAR: S1, S2 normal. No murmurs, rubs, or gallops.  ABDOMEN: Soft, nontender, nondistended. Bowel sounds present. No organomegaly or  mass.  EXTREMITIES: No cyanosis, clubbing or edema b/l.    NEUROLOGIC: Cranial nerves II through XII are intact. No focal Motor or sensory deficits b/l.  Globally weak.  PSYCHIATRIC: The patient is alert and oriented x 2.  SKIN: No obvious rash, lesion, or ulcer.    DATA REVIEW:   CBC Recent Labs  Lab 10/19/18 0716  WBC 9.8  HGB 12.8  HCT 38.6  PLT 212    Chemistries  Recent Labs  Lab 10/19/18 0716  NA 139  K 3.6  CL 105  CO2 25  GLUCOSE 122*  BUN 38*  CREATININE 0.78  CALCIUM 9.3  MG 2.0    Cardiac Enzymes Recent Labs  Lab 10/18/18 0330  TROPONINI 0.40*    Microbiology Results  Results for orders placed or performed during the hospital encounter of 10/16/18  SARS Coronavirus 2 [facility] order, Performed in [place] hospital lab)     Status: None   Collection Time: 10/16/18  9:20 PM  Result Value Ref Range Status   SARS Coronavirus 2 NEGATIVE NEGATIVE Final    Comment: (NOTE) If result is NEGATIVE SARS-CoV-2 target nucleic acids are NOT DETECTED. The SARS-CoV-2 RNA is generally detectable in upper and lower  respiratory specimens during the acute phase of infection. The lowest  concentration of SARS-CoV-2 viral copies this assay can detect is 250  copies / mL. A negative result does not preclude SARS-CoV-2 infection  and should not be used as the sole basis for treatment or other  patient management decisions.  A negative result may occur with  improper specimen collection / handling, submission of specimen other  than nasopharyngeal swab, presence of viral mutation(s) within the  areas targeted by this assay, and inadequate number of viral copies  (<250 copies / mL). A negative result must be combined with clinical  observations, patient history, and epidemiological information. If result is POSITIVE SARS-CoV-2 target nucleic acids are DETECTED. The SARS-CoV-2 RNA is generally detectable in upper and lower  respiratory specimens dur ing the acute  phase of infection.  Positive  results are indicative of active infection with SARS-CoV-2.  Clinical  correlation with patient history and other diagnostic information is  necessary to determine patient infection status.  Positive results do  not rule out bacterial infection or co-infection with other viruses. If result is PRESUMPTIVE POSTIVE SARS-CoV-2 nucleic acids MAY BE PRESENT.   A presumptive positive result was obtained on the submitted specimen  and confirmed on repeat testing.  While 2019 novel coronavirus  (SARS-CoV-2) nucleic acids may be present in the submitted sample  additional confirmatory testing may be necessary for epidemiological  and / or clinical management purposes  to differentiate between  SARS-CoV-2 and other  Sarbecovirus currently known to infect humans.  If clinically indicated additional testing with an alternate test  methodology [phone]) is advised. The SARS-CoV-2 RNA is generally  detectable in upper and lower respiratory sp ecimens during the acute  phase of infection. The expected result is Negative. Fact Sheet for Patients:  StrictlyIdeas.no Fact Sheet for Healthcare Providers: BankingDealers.co.za This test is not yet approved or cleared by the [place] FDA and has been authorized for detection and/or diagnosis of SARS-CoV-2 by FDA under an Emergency Use Authorization (EUA).  This EUA will remain in effect (meaning this test can be used) for the duration of the COVID-19 declaration under Section 564(b)(1) of the Act, 21 U.S.C. section 360bbb-3(b)(1), unless the authorization is terminated or revoked sooner. Performed at [facility], [address], [place], [place] [address]     RADIOLOGY:  No results found.    Management plans discussed with the patient, family and they are in agreement.  CODE STATUS:     Code Status Orders  (From admission, onward)         Start      Ordered   10/18/18 1225  Do not attempt resuscitation (DNR)  Continuous    Question Answer Comment  In the event of cardiac or respiratory ARREST Do not call a "code blue"   In the event of cardiac or respiratory ARREST Do not perform Intubation, CPR, defibrillation or ACLS   In the event of cardiac or respiratory ARREST Use medication by any route, position, wound care, and other measures to relive pain and suffering. May use oxygen, suction and manual treatment of airway obstruction as needed for comfort.      10/18/18 1224          TOTAL TIME TAKING CARE OF THIS PATIENT: 40 minutes.    Henreitta Leber M.D on 10/19/2018 at 2:26 PM  Between 7am to 6pm - Pager - [phone]  After 6pm go to [email or web address] - Proofreader  [facility]  Office  [phone]  CC: Primary care physician; Albina Billet, MD

## 2018-10-19 NOTE — TOC Initial Note (Signed)
Transition of Care [facility]) - Initial/Assessment Note    Patient Details  Name: Yolanda Turner MRN: [id number] Date of Birth: July 07, 1925  Transition of Care [facility]) CM/SW Contact:    Elza Rafter, RN Phone Number: 10/19/2018, 2:47 PM  Clinical Narrative:     Patient is from home with Daughter and granddaughter.  PT is recommending SNF.  Family refuses and wants to bring her home and have home health services with [facility].  Cory with [facility] excepted.  He is aware of discharge today.  She uses a walker; Current with PCP and obtains prescriptions at [facility].  Family transports patient to appointments.  Home is handicapped accessible.  No further needs identified at this time.  Granddaughter will transport home today.                Expected Discharge Plan: [place] Barriers to Discharge: No Barriers Identified   Patient Goals and CMS Choice Patient states their goals for this hospitalization and ongoing recovery are:: family chooses [facility].gov Compare Post Acute Care list provided to:: Patient Represenative (must comment)(Granddaughter) Choice offered to / list presented to : Adult Children  Expected Discharge Plan and Services Expected Discharge Plan: [place]   Discharge Planning Services: CM Consult Post Acute Care Choice: [place] arrangements for the past 2 months: Single Family Home Expected Discharge Date: 10/19/18                         HH Arranged: RN, PT, OT, Nurse's Aide, Social Work [employer] Agency: [place] Date [place]: 10/19/18 Time [place]: 1446 Representative spoke with at [place]: [place] Arrangements/Services Living arrangements for the past 2 months: [place] Lives with:: Adult Children Patient language and need for interpreter reviewed:: No Do you feel safe going back to the place where you live?: Yes      Need for Family Participation  in Patient Care: Yes (Comment)(daughters and granddaughters) Care giver support system in place?: (daughters and granddaughters)   Criminal Activity/Legal Involvement Pertinent to Current Situation/Hospitalization: No - Comment as needed  Activities of Daily Living Home Assistive Devices/Equipment: Cane (specify quad or straight) ADL Screening (condition at time of admission) Patient's cognitive ability adequate to safely complete daily activities?: Yes Is the patient deaf or have difficulty hearing?: No Does the patient have difficulty seeing, even when wearing glasses/contacts?: No Does the patient have difficulty concentrating, remembering, or making decisions?: Yes Patient able to express need for assistance with ADLs?: Yes Does the patient have difficulty dressing or bathing?: No Independently performs ADLs?: Yes (appropriate for developmental age) Does the patient have difficulty walking or climbing stairs?: Yes Weakness of Legs: Both Weakness of Arms/Hands: None  Permission Sought/Granted Permission sought to share information with : Facility Sport and exercise psychologist    Share Information with NAME: Tommi Rumps with [facility]           Emotional Assessment Appearance:: Appears stated age Attitude/Demeanor/Rapport: Gracious Affect (typically observed): Accepting Orientation: : Oriented to Self, Oriented to Place, Oriented to  Time, Oriented to Situation(asphasia) Alcohol / Substance Use: Not Applicable    Admission diagnosis:  Back Pain Patient Active Problem List   Diagnosis Date Noted  . Goals of care, counseling/discussion   . Palliative care by specialist   . GERD (gastroesophageal reflux disease) 10/16/2018  . CAD (coronary artery disease) 10/16/2018  . Atrial fibrillation with  RVR ([place]) 10/16/2018  . CVA (cerebral vascular accident) ([place]) 05/07/2018  . Hypertension 02/02/2018  . Diabetes mellitus without complication ([place]) 35/32/9924  . Carotid artery stenosis 02/02/2018   . Acute CVA (cerebrovascular accident) ([place]) 04/04/2017  . Speech problem 04/02/2017  . Confusion 04/02/2017  . TIA (transient ischemic attack) 04/02/2017   PCP:  Albina Billet, MD Pharmacy:   [id number] - [place], [address] [place] [address] Phone: [phone] Fax: [place], [place]. [place] [address] Phone: [phone] Fax: [phone]     Social Determinants of Health (SDOH) Interventions    Readmission Risk Interventions No flowsheet data found.

## 2018-10-19 NOTE — Evaluation (Signed)
Physical Therapy Evaluation Patient Details Name: Yolanda Turner MRN: [id number] DOB: 1925-08-30 Today's Date: 10/19/2018   History of Present Illness  Yolanda Turner is a 85 y.o. female who presented to ED 10/16/2018 by EMS for apparent back pain. She was found to have afib w/ RVR and PVC's w/ wide QRS complexes/LBBB, elevated troponin, and AMS and was admitted to the hospital. Her troponin continued to rise. She has history of afib but was not on chronic anticoagulation due to overall frailty with risk of falling. She was given heperin drip for possible NSTEMI. Troponin has since improved. She continues to have pauses in heart rythm that prompted change to DNR from full code. Cardiac catheterization not reccomended due to being poor candidate. PMH includes corated artery stenosis, myocardial infarction, PVD, renal artery stenosis, stroke, uterine cancer, GERD, diabetes, collagen vascular disease, CAD, osteoporosis, s/p double vessel coronary bypass 2005, CVA 04/2018, HTN, neuropathy.   Clinical Impression  Patient requires minA for sup>sit with cuing for set up and hand placement. Patient able to complete STS minA w/ RW, requiring minA to remain standing. Patient is able to ambulate a couple feet to chair with RW and minA for balance and RW mangement. Patient requires one-step cuing for sequencing for understanding of "how" to turn body and move LE to get to chair. Recommendation at this time SNF d/t balance concerns with standing and ambulation, and assistance needed for transfers. Would benefit from skilled PT to address above deficits and promote optimal return to PLOF     Follow Up Recommendations SNF    Equipment Recommendations  Rolling walker with 5" wheels    Recommendations for Other Services Rehab consult     Precautions / Restrictions Precautions Precautions: Fall;Other (comment) Precaution Comments: Heart rhythm Restrictions Weight Bearing Restrictions: No      Mobility  Bed  Mobility Overal bed mobility: Needs Assistance Bed Mobility: Supine to Sit     Supine to sit: Min assist     General bed mobility comments: Good negotiation with LE with minA to initiate trunk motion to sit  Transfers Overall transfer level: Needs assistance Equipment used: Rolling walker (2 wheeled) Transfers: Sit to/from Stand Sit to Stand: Min assist         General transfer comment: minA for initiation and to remain standing, patient with poor standing balance  Ambulation/Gait Ambulation/Gait assistance: Min assist Gait Distance (Feet): 3 Feet Assistive device: Rolling walker (2 wheeled) Gait Pattern/deviations: Leaning posteriorly;Shuffle Gait velocity: decreased   General Gait Details: Patient requires min assist to prevent from posterior LOB, follows 1 step instructions but is unable to sequence without cuing  Stairs            Wheelchair Mobility    Modified Rankin (Stroke Patients Only)       Balance Overall balance assessment: Needs assistance Sitting-balance support: Feet supported;No upper extremity supported Sitting balance-Leahy Scale: Good       Standing balance-Leahy Scale: Poor                               Pertinent Vitals/Pain Pain Assessment: No/denies pain    Home Living Family/patient expects to be discharged to:: Private residence Living Arrangements: Children Available Help at Discharge: Family;Available 24 hours/day Type of Home: House Home Access: Ramped entrance Entrance Stairs-Rails: Right;Left;Can reach both   Home Layout: One level Home Equipment: Walker - 2 wheels;Tub bench;Cane - quad;Bedside commode Additional Comments: does not wear  O2 at home    Prior Function Level of Independence: Needs assistance   Gait / Transfers Assistance Needed: Family reports she ambulated household distances with RW  ADL's / Homemaking Assistance Needed: Family reports she is I with dressing and bathing, likes to cook  her own meals and do dishes. Needs help with grocery shopping, driving.   Comments: Patient has expressive difficulties and unable to provide history. Yolanda Margreta Journey [facility]) is present and able to give detailed history. Patient lives with her Yolanda Yolanda Turner who assists her as neccessary and helps with the care of pt's son Yolanda Turner and great Yolanda Yolanda Turner, who both have CP. Yolanda Turner is bedbound and pt's Yolanda Yolanda Turner comes to care for her daily. Yolanda Turner is cared for by pts's daughter Yolanda Turner.  Yolanda Turner states that family would be able to provide 24/7 care to patient if she were to return home.      Hand Dominance   Dominant Hand: Right    Extremity/Trunk Assessment                Communication   Communication: Expressive difficulties  Cognition Arousal/Alertness: Awake/alert Behavior During Therapy: Flat affect Overall Cognitive Status: History of cognitive impairments - at baseline                                 General Comments: Patient has expressive aphasia and feels she knows orienting questions but is unable to express - word finding difficulties. Gets frustrated. Yolanda Yolanda Turner present to provide history and explains this is usual for patient.       General Comments      Exercises Other Exercises Other Exercises: PT cued patient through Supine to sit transfer with patietn able to demonstrate good negotiation of LE to EOB, with minA needed tfor initiation of trunk to EOB. Once sitting patient able to remain sitting ind with fair sitting balance Other Exercises: STS: PT cuing for hand  placement and set up with good carry over and minA required for initiation, but patietn able to come to stand ind following. Patietn requires minA to remain standing d/t poor standing balance despite cuing for hip and knee ext Other Exercises: Amb to chair: patient unable to sequence steps to chair without 1 step cuing. PT provided heavy cuing for  RW management and safety, and minA for patient to maintain balance. Fatigue following, HR Turner 90s, returns to mid 80s   Assessment/Plan    PT Assessment Patient needs continued PT services  PT Problem List Decreased strength;Decreased balance;Decreased activity tolerance;Decreased mobility;Decreased safety awareness;Decreased coordination       PT Treatment Interventions Gait training;Therapeutic activities;Neuromuscular re-education;Stair training;Therapeutic exercise;DME instruction;Functional mobility training;Balance training;Patient/family education    PT Goals (Current goals can be found in the Care Plan section)  Acute Rehab PT Goals Patient Stated Goal: Return home with family; walk ind with RW PT Goal Formulation: With patient Time For Goal Achievement: 11/02/18 Potential to Achieve Goals: Fair    Frequency Min 2X/week   Barriers to discharge        Co-evaluation               AM-PAC PT "6 Clicks" Mobility  Outcome Measure Help needed turning from your back to your side while in a flat bed without using bedrails?: A Little Help needed moving from lying on your back to sitting on the side of a flat bed without using bedrails?: A Little Help needed moving  to and from a bed to a chair (including a wheelchair)?: A Little Help needed standing up from a chair using your arms (e.g., wheelchair or bedside chair)?: A Lot Help needed to walk in hospital room?: A Lot Help needed climbing 3-5 steps with a railing? : A Lot 6 Click Score: 15    End of Session Equipment Utilized During Treatment: Gait belt Activity Tolerance: Patient limited by fatigue Patient left: in chair;with chair alarm set;with call bell/phone within reach Nurse Communication: Mobility status PT Visit Diagnosis: Unsteadiness on feet (R26.81);Other abnormalities of gait and mobility (R26.89);History of falling (Z91.81);Muscle weakness (generalized) (M62.81);Difficulty in walking, not elsewhere classified  (R26.2)    Time: 8185-6314 PT Time Calculation (min) (ACUTE ONLY): 17 min   Charges:   PT Evaluation $PT Eval Moderate Complexity: 1 Mod PT Treatments $Therapeutic Activity: 8-22 mins        Shelton Silvas PT, DPT  Shelton Silvas 10/19/2018, 12:52 PM

## 2018-10-19 NOTE — Progress Notes (Signed)
Discharge instructions explained to pt and pts granddaughter/ verbalized an understanding / iv and tele removed/ RX given to pt/ will transport off unit via wheelchair.

## 2018-10-19 NOTE — Plan of Care (Signed)
°  Problem: Clinical Measurements: °Goal: Ability to maintain clinical measurements within normal limits will improve °Outcome: Progressing °Goal: Diagnostic test results will improve °Outcome: Progressing °Goal: Cardiovascular complication will be avoided °Outcome: Progressing °  °

## 2018-11-25 IMAGING — CT CT HEAD W/O CM
2 of 4 series · 12 of 47 positions shown, 15 images · non-contrast
Comparison: 08/13/2014 MR, 08/12/2014 CT and prior studies

CLINICAL DATA: 89-year-old female with altered level of
consciousness and headache.

EXAM:
CT HEAD WITHOUT CONTRAST
TECHNIQUE: Contiguous axial images were obtained from the base of the skull
through the vertex without intravenous contrast.

[Series 4: coronal soft tissue · coronal · 0.29mm/px · 3 of 60 slices shown]
[im 20/60  brain]
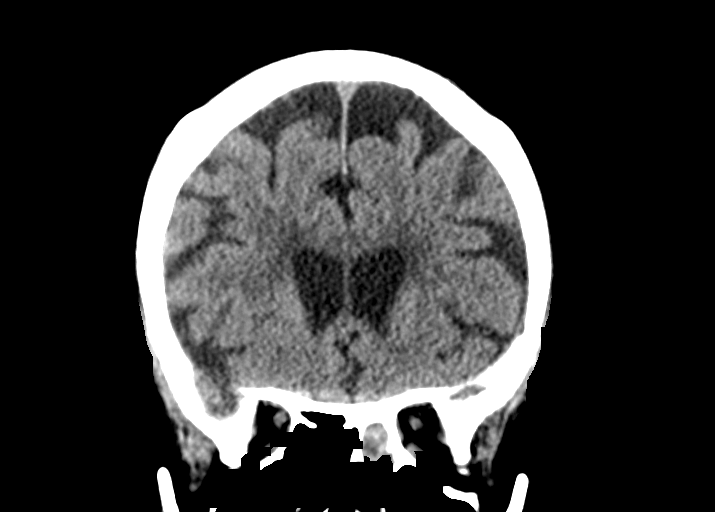
[im 27/60  brain]
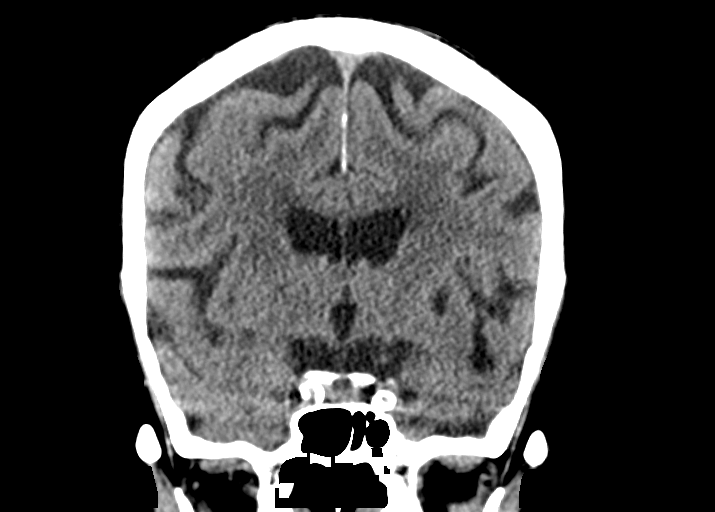
[im 33/60  brain]
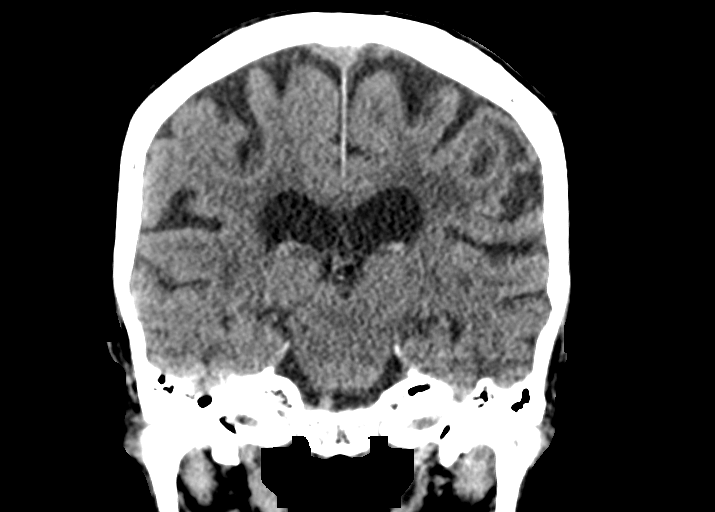

[Series 6: axial soft tissue · axial · 0.36mm/px · z∈[-255,-118]mm · 9 of 55 slices shown, 12 images]
[im 4/55  brain]
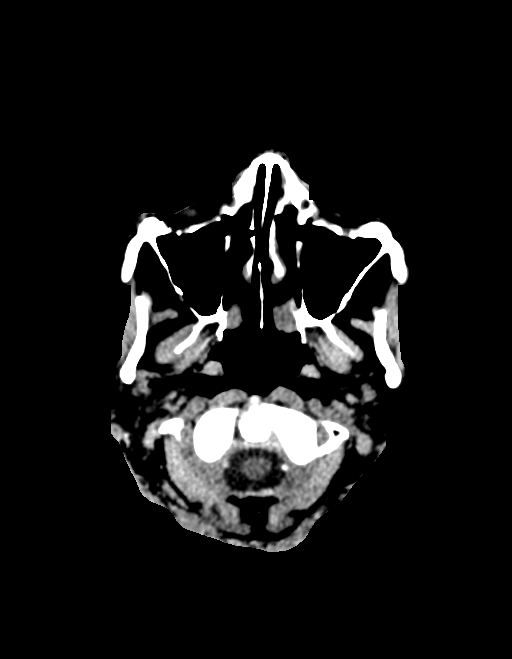
[im 4/55  bone]
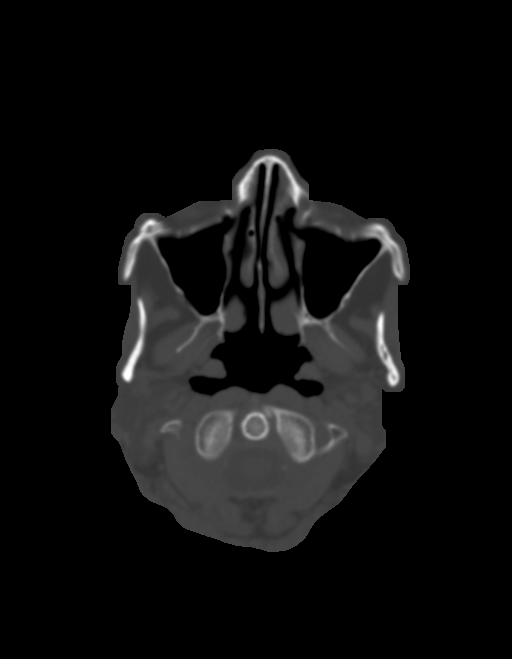
[im 10/55  brain]
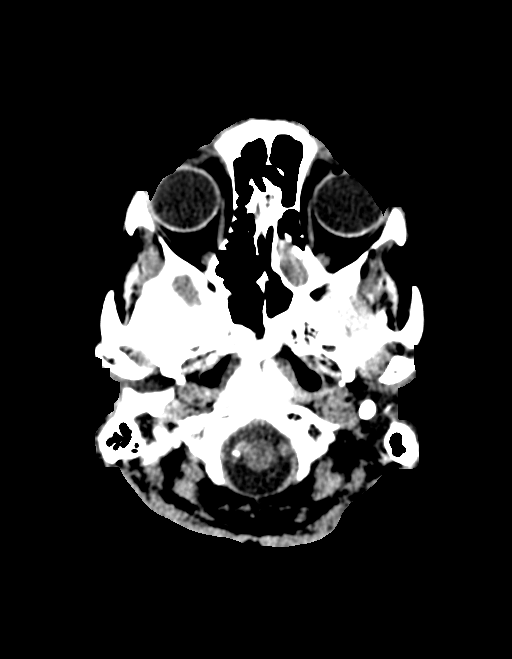
[im 16/55  brain]
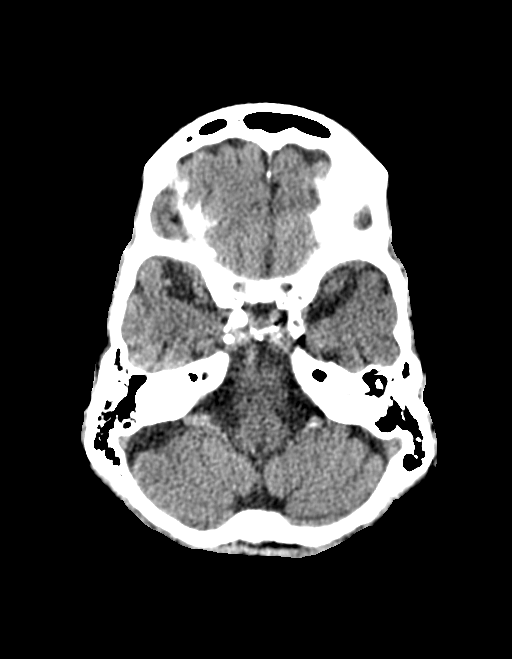
[im 22/55  brain]
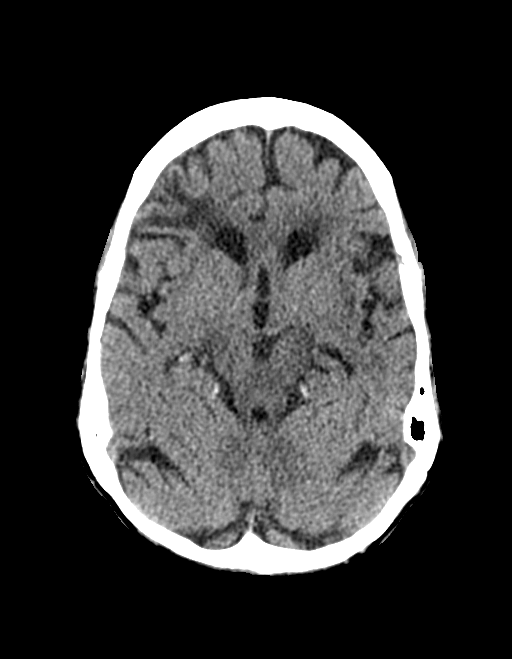
[im 28/55  brain]
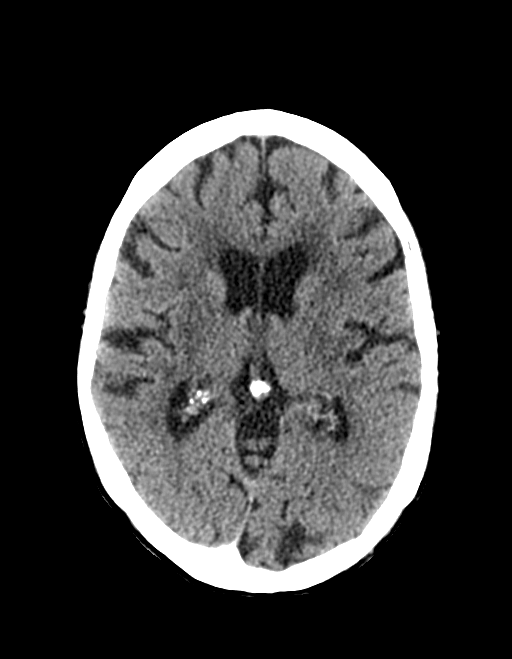
[im 28/55  bone]
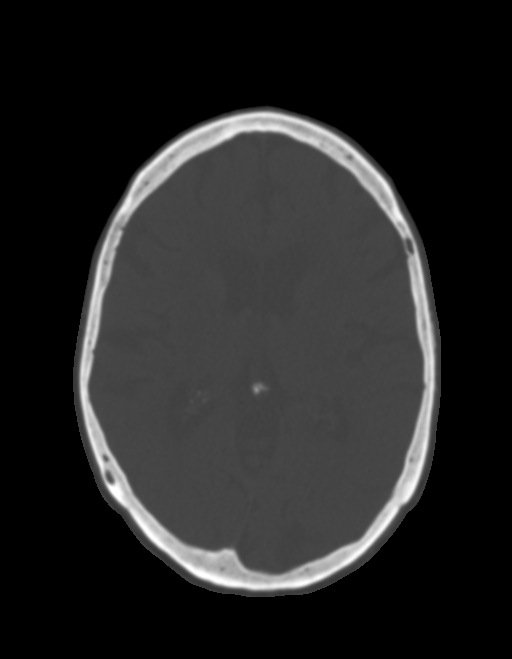
[im 34/55  brain]
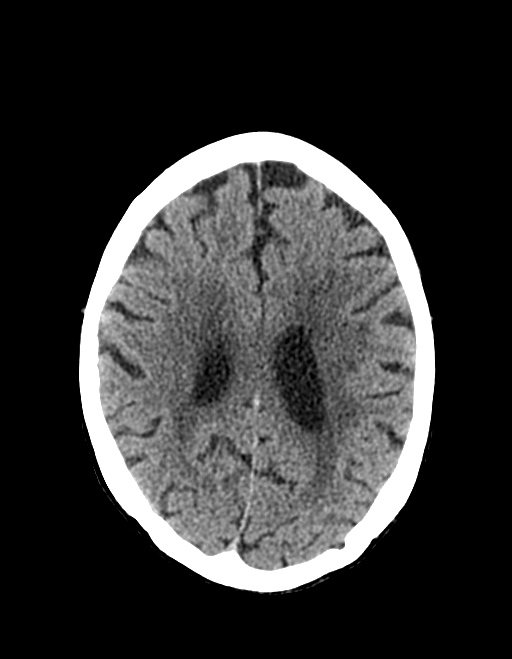
[im 40/55  brain]
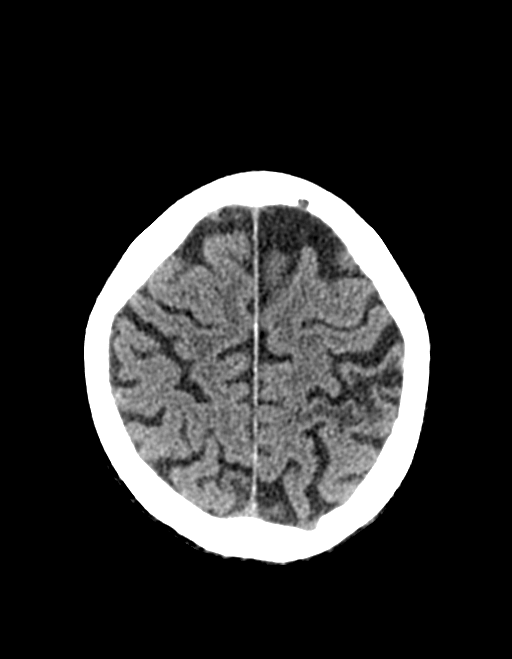
[im 46/55  brain]
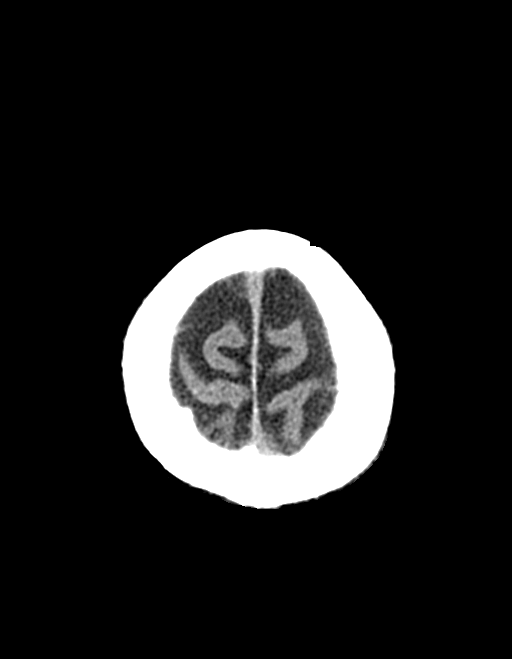
[im 52/55  brain]
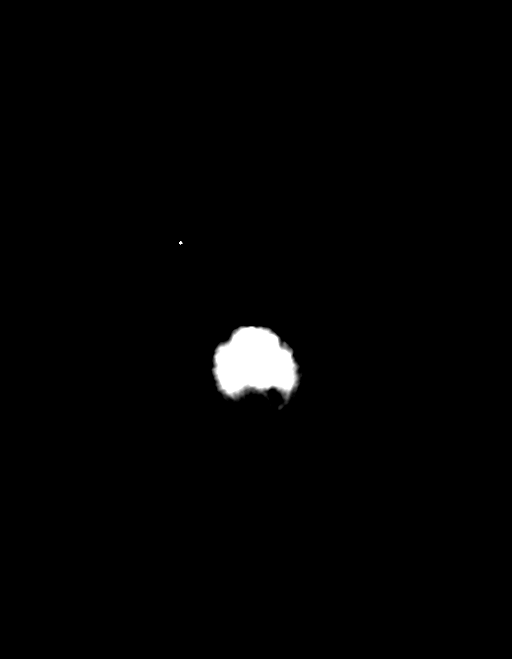
[im 52/55  bone]
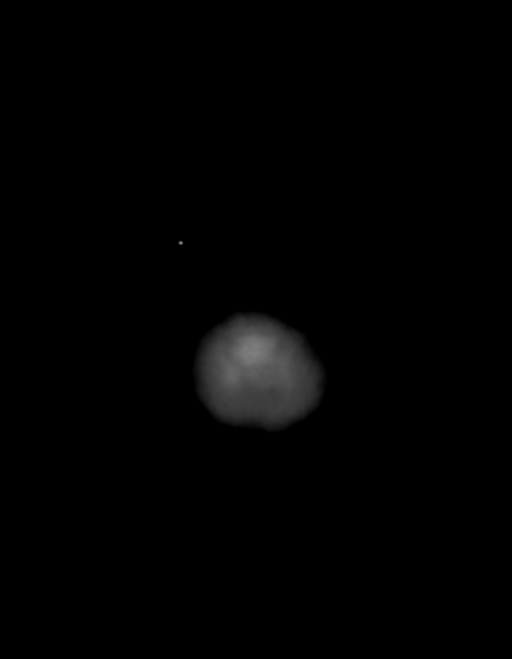

[12 of 47 positions shown; findings below may reference images not displayed]

FINDINGS: Brain: No evidence of acute infarction, hemorrhage, hydrocephalus,
extra-axial collection or mass lesion/mass effect.

Atrophy, chronic small-vessel white matter ischemic changes and
remote right frontal, left occipital, left basal ganglia and left
frontoparietal infarcts noted.

Vascular: Intracranial atherosclerotic calcifications noted

Skull: Normal. Negative for fracture or focal lesion.

Sinuses/Orbits: Single opacified posterior left ethmoid air cell
again noted. No acute abnormality

Other: None
IMPRESSION: 1. No evidence of acute intracranial abnormality
2. Atrophy, chronic small-vessel white matter ischemic changes and
remote infarcts as described.

## 2018-11-28 ENCOUNTER — Emergency Department: Payer: Medicare Other

## 2018-11-28 ENCOUNTER — Inpatient Hospital Stay
Admission: EM | Admit: 2018-11-28 | Discharge: 2018-12-02 | DRG: 470 | Disposition: A | Discharge status: Home Health | Payer: Medicare Other | Attending: Family Medicine | Admitting: Family Medicine

## 2018-11-28 ENCOUNTER — Other Ambulatory Visit: Payer: Self-pay

## 2018-11-28 DIAGNOSIS — I252 Old myocardial infarction: Secondary | ICD-10-CM

## 2018-11-28 DIAGNOSIS — Z66 Do not resuscitate: Secondary | ICD-10-CM | POA: Diagnosis present

## 2018-11-28 DIAGNOSIS — Z1159 Encounter for screening for other viral diseases: Secondary | ICD-10-CM | POA: Diagnosis not present

## 2018-11-28 DIAGNOSIS — M810 Age-related osteoporosis without current pathological fracture: Secondary | ICD-10-CM | POA: Diagnosis present

## 2018-11-28 DIAGNOSIS — I2510 Atherosclerotic heart disease of native coronary artery without angina pectoris: Secondary | ICD-10-CM | POA: Diagnosis present

## 2018-11-28 DIAGNOSIS — K219 Gastro-esophageal reflux disease without esophagitis: Secondary | ICD-10-CM | POA: Diagnosis present

## 2018-11-28 DIAGNOSIS — Y92019 Unspecified place in single-family (private) house as the place of occurrence of the external cause: Secondary | ICD-10-CM | POA: Diagnosis not present

## 2018-11-28 DIAGNOSIS — Z8542 Personal history of malignant neoplasm of other parts of uterus: Secondary | ICD-10-CM | POA: Diagnosis not present

## 2018-11-28 DIAGNOSIS — W010XXA Fall on same level from slipping, tripping and stumbling without subsequent striking against object, initial encounter: Secondary | ICD-10-CM | POA: Diagnosis present

## 2018-11-28 DIAGNOSIS — Z87891 Personal history of nicotine dependence: Secondary | ICD-10-CM | POA: Diagnosis not present

## 2018-11-28 DIAGNOSIS — S72009A Fracture of unspecified part of neck of unspecified femur, initial encounter for closed fracture: Secondary | ICD-10-CM | POA: Diagnosis present

## 2018-11-28 DIAGNOSIS — S72092A Other fracture of head and neck of left femur, initial encounter for closed fracture: Secondary | ICD-10-CM | POA: Diagnosis present

## 2018-11-28 DIAGNOSIS — E1151 Type 2 diabetes mellitus with diabetic peripheral angiopathy without gangrene: Secondary | ICD-10-CM | POA: Diagnosis present

## 2018-11-28 DIAGNOSIS — Y9301 Activity, walking, marching and hiking: Secondary | ICD-10-CM | POA: Diagnosis present

## 2018-11-28 DIAGNOSIS — W19XXXA Unspecified fall, initial encounter: Secondary | ICD-10-CM

## 2018-11-28 DIAGNOSIS — Z90710 Acquired absence of both cervix and uterus: Secondary | ICD-10-CM

## 2018-11-28 DIAGNOSIS — S72002A Fracture of unspecified part of neck of left femur, initial encounter for closed fracture: Secondary | ICD-10-CM

## 2018-11-28 DIAGNOSIS — Z7982 Long term (current) use of aspirin: Secondary | ICD-10-CM | POA: Diagnosis not present

## 2018-11-28 DIAGNOSIS — Z7902 Long term (current) use of antithrombotics/antiplatelets: Secondary | ICD-10-CM | POA: Diagnosis not present

## 2018-11-28 DIAGNOSIS — R4701 Aphasia: Secondary | ICD-10-CM | POA: Diagnosis present

## 2018-11-28 DIAGNOSIS — Z951 Presence of aortocoronary bypass graft: Secondary | ICD-10-CM | POA: Diagnosis not present

## 2018-11-28 DIAGNOSIS — I10 Essential (primary) hypertension: Secondary | ICD-10-CM | POA: Diagnosis present

## 2018-11-28 DIAGNOSIS — Z8673 Personal history of transient ischemic attack (TIA), and cerebral infarction without residual deficits: Secondary | ICD-10-CM | POA: Diagnosis not present

## 2018-11-28 DIAGNOSIS — M25552 Pain in left hip: Secondary | ICD-10-CM | POA: Diagnosis present

## 2018-11-28 DIAGNOSIS — Z96649 Presence of unspecified artificial hip joint: Secondary | ICD-10-CM

## 2018-11-28 LAB — BASIC METABOLIC PANEL
Anion gap: 13 (ref 5–15)
BUN: 39 mg/dL — ABNORMAL HIGH (ref 8–23)
CO2: 24 mmol/L (ref 22–32)
Calcium: 9.3 mg/dL (ref 8.9–10.3)
Chloride: 103 mmol/L (ref 98–111)
Creatinine, Ser: 0.93 mg/dL (ref 0.44–1.00)
GFR calc Af Amer: >60 mL/min (ref >60)
GFR calc non Af Amer: 54 mL/min — ABNORMAL LOW (ref >60)
Glucose, Bld: 123 mg/dL — ABNORMAL HIGH (ref 70–99)
Potassium: 4.9 mmol/L (ref 3.5–5.1)
Sodium: 140 mmol/L (ref 135–145)

## 2018-11-28 LAB — GLUCOSE, CAPILLARY
Glucose-Capillary: 100 mg/dL — ABNORMAL HIGH (ref 70–99)
Glucose-Capillary: 86 mg/dL (ref 70–99)
Glucose-Capillary: 98 mg/dL (ref 70–99)

## 2018-11-28 LAB — CBC
HCT: 37.2 % (ref 36.0–46.0)
Hemoglobin: 11.8 g/dL — ABNORMAL LOW (ref 12.0–15.0)
MCH: 28.4 pg (ref 26.0–34.0)
MCHC: 31.7 g/dL (ref 30.0–36.0)
MCV: 89.4 fL (ref 80.0–100.0)
Platelets: 186 10*3/uL (ref 150–400)
RBC: 4.16 MIL/uL (ref 3.87–5.11)
RDW: 13.3 % (ref 11.5–15.5)
WBC: 8.6 10*3/uL (ref 4.0–10.5)
nRBC: 0 % (ref 0.0–0.2)

## 2018-11-28 LAB — SURGICAL PCR SCREEN
MRSA, PCR: NEGATIVE
Staphylococcus aureus: NEGATIVE

## 2018-11-28 LAB — TSH: TSH: 4.362 u[IU]/mL (ref 0.350–4.500)

## 2018-11-28 LAB — SARS CORONAVIRUS 2 BY RT PCR (HOSPITAL ORDER, PERFORMED IN ~~LOC~~ HOSPITAL LAB): SARS Coronavirus 2: NEGATIVE

## 2018-11-28 LAB — HEMOGLOBIN A1C
Hgb A1c MFr Bld: 6.2 % — ABNORMAL HIGH (ref 4.8–5.6)
Mean Plasma Glucose: 131.24 mg/dL

## 2018-11-28 MED ORDER — METOPROLOL SUCCINATE ER 25 MG PO TB24
12.5000 mg | ORAL_TABLET | Freq: Every day | ORAL | Status: DC
  Administered 2018-11-28 – 2018-12-02 (×5): 12.5 mg via ORAL
  Filled 2018-11-28 (×5): qty 1

## 2018-11-28 MED ORDER — CLINDAMYCIN PHOSPHATE 600 MG/50ML IV SOLN
600.0000 mg | Freq: Once | INTRAVENOUS | Status: AC
  Administered 2018-11-28: 600 mg via INTRAVENOUS
  Filled 2018-11-28: qty 50

## 2018-11-28 MED ORDER — SODIUM CHLORIDE 0.9 % IV SOLN
INTRAVENOUS | Status: DC
  Administered 2018-11-28 – 2018-11-29 (×3): via INTRAVENOUS

## 2018-11-28 MED ORDER — DOCUSATE SODIUM 100 MG PO CAPS
100.0000 mg | ORAL_CAPSULE | Freq: Two times a day (BID) | ORAL | Status: DC
  Administered 2018-11-29: 100 mg via ORAL
  Filled 2018-11-28 (×2): qty 1

## 2018-11-28 MED ORDER — MUPIROCIN 2 % EX OINT
1.0000 "application " | TOPICAL_OINTMENT | Freq: Two times a day (BID) | CUTANEOUS | Status: DC
  Administered 2018-11-28 – 2018-12-02 (×9): 1 via NASAL
  Filled 2018-11-28: qty 22

## 2018-11-28 MED ORDER — SIMVASTATIN 20 MG PO TABS
40.0000 mg | ORAL_TABLET | Freq: Every day | ORAL | Status: DC
  Administered 2018-11-28 – 2018-12-02 (×5): 40 mg via ORAL
  Filled 2018-11-28 (×5): qty 2

## 2018-11-28 MED ORDER — ONDANSETRON HCL 4 MG PO TABS
4.0000 mg | ORAL_TABLET | Freq: Four times a day (QID) | ORAL | Status: DC | PRN
  Administered 2018-11-28 (×2): 4 mg via ORAL
  Filled 2018-11-28 (×2): qty 1

## 2018-11-28 MED ORDER — INSULIN ASPART 100 UNIT/ML ~~LOC~~ SOLN
0.0000 [IU] | SUBCUTANEOUS | Status: DC
  Administered 2018-11-29 – 2018-12-01 (×5): 1 [IU] via SUBCUTANEOUS
  Filled 2018-11-28 (×6): qty 1

## 2018-11-28 MED ORDER — OXYCODONE-ACETAMINOPHEN 5-325 MG PO TABS
1.0000 | ORAL_TABLET | Freq: Four times a day (QID) | ORAL | Status: DC | PRN
  Administered 2018-11-28 (×2): 1 via ORAL
  Filled 2018-11-28 (×2): qty 1

## 2018-11-28 MED ORDER — INSULIN ASPART 100 UNIT/ML ~~LOC~~ SOLN
0.0000 [IU] | Freq: Every day | SUBCUTANEOUS | Status: DC
  Administered 2018-11-29: 1 [IU] via SUBCUTANEOUS

## 2018-11-28 MED ORDER — ACETAMINOPHEN 650 MG RE SUPP: 650.0000 mg | Freq: Four times a day (QID) | RECTAL | Status: DC | PRN

## 2018-11-28 MED ORDER — LABETALOL HCL 5 MG/ML IV SOLN: 5.0000 mg | INTRAVENOUS | Status: DC | PRN

## 2018-11-28 MED ORDER — ACETAMINOPHEN 325 MG PO TABS
650.0000 mg | ORAL_TABLET | Freq: Four times a day (QID) | ORAL | Status: DC | PRN
  Filled 2018-11-28: qty 2

## 2018-11-28 MED ORDER — MORPHINE SULFATE (PF) 2 MG/ML IV SOLN
1.0000 mg | INTRAVENOUS | Status: DC | PRN
  Administered 2018-11-29: 1 mg via INTRAVENOUS
  Filled 2018-11-28 (×2): qty 1

## 2018-11-28 MED ORDER — ONDANSETRON HCL 4 MG/2ML IJ SOLN
4.0000 mg | Freq: Four times a day (QID) | INTRAMUSCULAR | Status: DC | PRN
  Administered 2018-11-28 – 2018-11-29 (×2): 4 mg via INTRAVENOUS
  Filled 2018-11-28 (×2): qty 2

## 2018-11-28 MED ORDER — GABAPENTIN 300 MG PO CAPS
600.0000 mg | ORAL_CAPSULE | Freq: Every day | ORAL | Status: DC
  Administered 2018-11-29 – 2018-12-01 (×3): 600 mg via ORAL
  Filled 2018-11-28 (×4): qty 2

## 2018-11-28 MED ORDER — LISINOPRIL 5 MG PO TABS
5.0000 mg | ORAL_TABLET | Freq: Every day | ORAL | Status: DC
  Administered 2018-11-28 – 2018-12-02 (×5): 5 mg via ORAL
  Filled 2018-11-28 (×5): qty 1

## 2018-11-28 NOTE — TOC Initial Note (Signed)
Transition of Care [facility]) - Initial/Assessment Note    Patient Details  Name: Yolanda Turner MRN: [id number] Date of Birth: 05-Aug-1925  [facility]) CM/SW Contact:    Florina Glas, Lenice Llamas Phone Number: [phone]  11/28/2018, 4:15 PM  Clinical Narrative: Clinical Social Worker ([place]) reviewed chart and noted that patient has a hip fracture and will have surgery. CSW met with patient alone at bedside today prior to surgery to discuss D/C plan. Patient was alert and oriented X3 and was laying in the bed. Patient reported that she lives in [place] with her daughter and is agreeable to go to a SNF if needed. FL2 complete and faxed out. CSW attempted to contact patient's daughters [name] (left voicemail), Silva Bandy (voicemail not set up) and Shirlee Limerick (voicemail not set up) however they did not answer. CSW will continue to follow and assist as needed.                 Expected Discharge Plan: Skilled Nursing Facility Barriers to Discharge: Continued Medical Work up   Patient Goals and CMS Choice Patient states their goals for this hospitalization and ongoing recovery are:: Pain control.      Expected Discharge Plan and Services Expected Discharge Plan: [place] In-house Referral: Clinical Social Work Discharge Planning Services: CM Consult   Living arrangements for the past 2 months: [place]                                      Prior Living Arrangements/Services Living arrangements for the past 2 months: Single Family Home Lives with:: Adult Children Patient language and need for interpreter reviewed:: No Do you feel safe going back to the place where you live?: Yes      Need for Family Participation in Patient Care: Yes (Comment) Care giver support system in place?: Yes (comment)   Criminal Activity/Legal Involvement Pertinent to Current Situation/Hospitalization: No - Comment as needed  Activities of Daily Living Home Assistive  Devices/Equipment: None ADL Screening (condition at time of admission) Patient's cognitive ability adequate to safely complete daily activities?: Yes Is the patient deaf or have difficulty hearing?: Yes Does the patient have difficulty seeing, even when wearing glasses/contacts?: No Does the patient have difficulty concentrating, remembering, or making decisions?: Yes Patient able to express need for assistance with ADLs?: Yes Does the patient have difficulty dressing or bathing?: Yes Independently performs ADLs?: No Does the patient have difficulty walking or climbing stairs?: Yes Weakness of Legs: Left Weakness of Arms/Hands: None  Permission Sought/Granted Permission sought to share information with : Family Supports Permission granted to share information with : Yes, Verbal Permission Granted              Emotional Assessment Appearance:: Appears stated age   Affect (typically observed): Pleasant, Calm Orientation: : Oriented to Self, Oriented to Place, Oriented to Situation, Fluctuating Orientation (Suspected and/or reported Sundowners) Alcohol / Substance Use: Not Applicable Psych Involvement: No (comment)  Admission diagnosis:  Fall [W19.XXXA] Closed fracture of left hip, initial encounter [facility]) [S72.002A] Patient Active Problem List   Diagnosis Date Noted  . Hip fracture ([place]) 11/28/2018  . Goals of care, counseling/discussion   . Palliative care by specialist   . GERD (gastroesophageal reflux disease) 10/16/2018  . CAD (coronary artery disease) 10/16/2018  . Atrial fibrillation with RVR ([place]) 10/16/2018  . CVA (cerebral vascular accident) ([place]) 05/07/2018  .  Hypertension 02/02/2018  . Diabetes mellitus without complication ([place]) 25/18/9842  . Carotid artery stenosis 02/02/2018  . Acute CVA (cerebrovascular accident) ([place]) 04/04/2017  . Speech problem 04/02/2017  . Confusion 04/02/2017  . TIA (transient ischemic attack) 04/02/2017   PCP:  Albina Billet,  MD Pharmacy[facility]   [id number] [place] [id number]: [phone]: [address] [address]Phone: [phone] Fax: [phone]    Social Determinants of Health (SDOH) Interventions    Readmission Risk Interventions No flowsheet data found.

## 2018-11-28 NOTE — Progress Notes (Signed)
Because of OR emergencies surgery could not be done today.  I will be able do that tomorrow for Dr. Poggi.  Orders entered for diet tonight and n.p.o. after midnight as well as consent. 

## 2018-11-28 NOTE — NC FL2 (Signed)
[place] LEVEL OF CARE SCREENING TOOL     IDENTIFICATION  Patient Name: Yolanda Turner Birthdate: 30-Jun-1925 Sex: female Admission Date (Current Location): 11/28/2018  [facility] and [place] Number:  Engineering geologist and Address:         Provider Number: [id number]  Attending Physician Name and Address:  Hillary Bow, MD  Relative Name and Phone Number:       Current Level of Care: Hospital Recommended Level of Care: [place] Prior Approval Number:    Date Approved/Denied:   PASRR Number: [id number]  Discharge Plan: SNF    Current Diagnoses: Patient Active Problem List   Diagnosis Date Noted  . Hip fracture ([place]) 11/28/2018  . Goals of care, counseling/discussion   . Palliative care by specialist   . GERD (gastroesophageal reflux disease) 10/16/2018  . CAD (coronary artery disease) 10/16/2018  . Atrial fibrillation with RVR ([place]) 10/16/2018  . CVA (cerebral vascular accident) ([place]) 05/07/2018  . Hypertension 02/02/2018  . Diabetes mellitus without complication ([place]) 58/52/7782  . Carotid artery stenosis 02/02/2018  . Acute CVA (cerebrovascular accident) ([place]) 04/04/2017  . Speech problem 04/02/2017  . Confusion 04/02/2017  . TIA (transient ischemic attack) 04/02/2017    Orientation RESPIRATION BLADDER Height & Weight     Self, Situation, Place  Normal Continent Weight: 77 lb 3.2 oz (35 kg) Height:  5\' 1"  (154.9 cm)  BEHAVIORAL SYMPTOMS/MOOD NEUROLOGICAL BOWEL NUTRITION STATUS      Continent Diet(Diet: Regular)  AMBULATORY STATUS COMMUNICATION OF NEEDS Skin   Extensive Assist Verbally Surgical wounds                       Personal Care Assistance Level of Assistance  Bathing, Feeding, Dressing Bathing Assistance: Limited assistance Feeding assistance: Independent Dressing Assistance: Limited assistance     Functional Limitations Info  Sight, Hearing, Speech Sight Info: Adequate Hearing Info: Impaired Speech  Info: Impaired    SPECIAL CARE FACTORS FREQUENCY  PT (By licensed PT), OT (By licensed OT)     PT Frequency: 5 OT Frequency: 5            Contractures      Additional Factors Info  Code Status, Allergies Code Status Info: DNR Allergies Info: Penicillins           Current Medications (11/28/2018):  This is the current hospital active medication list Current Facility-Administered Medications  Medication Dose Route Frequency Provider Last Rate Last Dose  . 0.9 %  sodium chloride infusion   Intravenous Continuous Harrie Foreman, MD   Stopped at 11/28/18 [phone]  . acetaminophen (TYLENOL) tablet 650 mg  650 mg Oral Q6H PRN Harrie Foreman, MD       Or  . acetaminophen (TYLENOL) suppository 650 mg  650 mg Rectal Q6H PRN Harrie Foreman, MD      . clindamycin (CLEOCIN) IVPB 600 mg  600 mg Intravenous Once Harrie Foreman, MD      . docusate sodium (COLACE) capsule 100 mg  100 mg Oral BID Harrie Foreman, MD      . gabapentin (NEURONTIN) capsule 600 mg  600 mg Oral QHS Harrie Foreman, MD      . insulin aspart (novoLOG) injection 0-5 Units  0-5 Units Subcutaneous QHS Harrie Foreman, MD      . insulin aspart (novoLOG) injection 0-9 Units  0-9 Units Subcutaneous Q4H Harrie Foreman, MD      . labetalol (NORMODYNE)  injection 5 mg  5 mg Intravenous Q2H PRN Harrie Foreman, MD      . lisinopril (ZESTRIL) tablet 5 mg  5 mg Oral Daily Harrie Foreman, MD   5 mg at 11/28/18 0932  . metoprolol succinate (TOPROL-XL) 24 hr tablet 12.5 mg  12.5 mg Oral Daily Harrie Foreman, MD   12.5 mg at 11/28/18 0932  . morphine 2 MG/ML injection 1 mg  1 mg Intravenous Q2H PRN Harrie Foreman, MD      . mupirocin ointment (BACTROBAN) 2 % 1 application  1 application Nasal BID Hillary Bow, MD   1 application at 38/18/29 0933  . ondansetron (ZOFRAN) tablet 4 mg  4 mg Oral Q6H PRN Harrie Foreman, MD   4 mg at 11/28/18 0932   Or  . ondansetron (ZOFRAN) injection 4 mg   4 mg Intravenous Q6H PRN Harrie Foreman, MD      . oxyCODONE-acetaminophen (PERCOCET/ROXICET) 5-325 MG per tablet 1 tablet  1 tablet Oral Q6H PRN Harrie Foreman, MD   1 tablet at 11/28/18 0932  . simvastatin (ZOCOR) tablet 40 mg  40 mg Oral Daily Harrie Foreman, MD   40 mg at 11/28/18 0932     Discharge Medications: Please see discharge summary for a list of discharge medications.  Relevant Imaging Results:  Relevant Lab Results:   Additional Information SSN: [id number]  Tonnie Stillman, Veronia Beets, LCSW

## 2018-11-28 NOTE — Progress Notes (Signed)
Pt admitted to rm 138. Oriented to unit and POC. Alert and oriented but has expressive aphasia. Family called and updated on POC. No adventitious heart or lung soundsPt made ready for surgery possibly today. VSS. Will continue to monitor.

## 2018-11-28 NOTE — ED Triage Notes (Signed)
Pt was bending over yesterday at 1800 to pick something up when she fell injuring her left hip. Pt is from home and ambulatory,. Daughter walked her back to bed but pt co persistent left hip pain.

## 2018-11-28 NOTE — ED Provider Notes (Signed)
[facility] Emergency Department Provider Note  Time seen: 4:07 AM  I have reviewed the triage vital signs and the nursing notes.   HISTORY  Chief Complaint Hip Pain   HPI Yolanda Turner is a 85 y.o. female with a past medical history of gastric reflux, diabetes, presents to the emergency department for left hip pain.  According to EMS patient had a fall around 6 PM last evening, daughter was able to help get the patient into bed but she continued to complain of significant left hip pain eventually EMS was called to bring the patient to the emergency department for evaluation.  Here the patient is awake and alert, no distress.  Patient is very frail in appearance.  Per report patient lives at home and is usually able to ambulate independently.   Past Medical History:  Diagnosis Date  . Carotid artery stenosis   . Carotid artery stenosis   . Collagen vascular disease ([place])   . Coronary artery disease   . Diabetes mellitus without complication (HCC)    diet control  . GERD (gastroesophageal reflux disease)   . Hypertension   . Myocardial infarction ([place])   . Osteoporosis   . Peripheral vascular disease ([place])   . Pneumonia   . Renal artery stenosis ([place])   . Status post double vessel coronary artery bypass 2005  . Stroke ([place])   . Uterine cancer [facility])     Patient Active Problem List   Diagnosis Date Noted  . Goals of care, counseling/discussion   . Palliative care by specialist   . GERD (gastroesophageal reflux disease) 10/16/2018  . CAD (coronary artery disease) 10/16/2018  . Atrial fibrillation with RVR ([place]) 10/16/2018  . CVA (cerebral vascular accident) ([place]) 05/07/2018  . Hypertension 02/02/2018  . Diabetes mellitus without complication ([place]) 09/32/6712  . Carotid artery stenosis 02/02/2018  . Acute CVA (cerebrovascular accident) ([place]) 04/04/2017  . Speech problem 04/02/2017  . Confusion 04/02/2017  . TIA (transient ischemic attack) 04/02/2017     Past Surgical History:  Procedure Laterality Date  . ABDOMINAL HYSTERECTOMY    . CAROTID ENDARTERECTOMY    . CATARACT EXTRACTION W/PHACO Right 09/28/2015   Procedure: CATARACT EXTRACTION PHACO AND INTRAOCULAR LENS PLACEMENT (IOC);  Surgeon: Estill Cotta, MD;  Location: [facility] ORS;  Service: Ophthalmology;  Laterality: Right;  [place] 041:18 AP% 28.6 CDE 114.94 fluid pack lot # [id number]  . CORONARY ARTERY BYPASS GRAFT    . PERIPHERAL VASCULAR CATHETERIZATION Right 01/22/2015   Procedure: Carotid PTA/Stent Intervention;  Surgeon: Katha Cabal, MD;  Location: [place] [facility];  Service: Cardiovascular;  Laterality: Right;  . uterine cancer surgery      Prior to Admission medications   Medication Sig Start Date End Date Taking? Authorizing Provider  aspirin EC 81 MG tablet Take 1 tablet (81 mg total) by mouth daily. 10/19/18   Henreitta Leber, MD  clopidogrel (PLAVIX) 75 MG tablet Take 75 mg by mouth at bedtime.    [provider]  gabapentin (NEURONTIN) 300 MG capsule Take 300 mg by mouth at bedtime.    [provider]  metoprolol succinate (TOPROL-XL) 25 MG 24 hr tablet Take 0.5 tablets (12.5 mg total) by mouth daily. 10/19/18 12/18/18  Henreitta Leber, MD  simvastatin (ZOCOR) 40 MG tablet Take 40 mg by mouth at bedtime.     [provider]    Allergies  Allergen Reactions  . Penicillins Rash    Has patient had a PCN reaction  causing immediate rash, facial/tongue/throat swelling, SOB or lightheadedness with hypotension: No Has patient had a PCN reaction causing severe rash involving mucus membranes or skin necrosis: No Has patient had a PCN reaction that required hospitalization: No Has patient had a PCN reaction occurring within the last 10 years: No If all of the above answers are "NO", then may proceed with Cephalosporin use.    Family History  Problem Relation Age of Onset  . Lung cancer Daughter     Social History Social History   Tobacco  Use  . Smoking status: Former Research scientist (life sciences)  . Smokeless tobacco: Never Used  Substance Use Topics  . Alcohol use: No  . Drug use: No    Review of Systems Constitutional: Negative for fever. Cardiovascular: Negative for chest pain. Respiratory: Negative for shortness of breath.  Denies any cough. Gastrointestinal: Negative for abdominal pain, vomiting Genitourinary: Negative for urinary compaints Musculoskeletal: Negative for musculoskeletal complaints Neurological: Negative for headache All other ROS negative  ____________________________________________   PHYSICAL EXAM:  VITAL SIGNS: ED Triage Vitals  Enc Vitals Group     BP 11/28/18 0406 (!) 156/71     Pulse Rate 11/28/18 0406 77     Resp 11/28/18 0406 20     Temp 11/28/18 0406 98 F (36.7 C)     Temp Source 11/28/18 0406 Oral     SpO2 11/28/18 0406 97 %     Weight --      Height --      Head Circumference --      Peak Flow --      Pain Score 11/28/18 0407 5     Pain Loc --      Pain Edu? --      Excl. in [place]? --    Constitutional: Alert and oriented.  Patient is frail appearing, but no distress. Eyes: Normal exam ENT      Head: Normocephalic and atraumatic.      Mouth/Throat: Mucous membranes are moist. Cardiovascular: Normal rate, regular rhythm. Respiratory: Normal respiratory effort without tachypnea nor retractions. Breath sounds are clear  Gastrointestinal: Soft and nontender. No distention.   Musculoskeletal: Moderate left hip tenderness palpation.  Pain with range of motion.  Neurovascular intact distally. Neurologic:  Normal speech and language. No gross focal neurologic deficits Skin:  Skin is warm, dry and intact.  Psychiatric: Mood and affect are normal.   ____________________________________________    EKG  EKG viewed and interpreted by myself shows a normal sinus rhythm at 79 bpm with a widened QRS, left axis deviation, morphology most consistent with left bundle branch block with nonspecific ST  changes.  ____________________________________________    RADIOLOGY  X-ray shows left femoral neck fracture. Chest x-ray negative  ____________________________________________   INITIAL IMPRESSION / ASSESSMENT AND PLAN / ED COURSE  Pertinent labs & imaging results that were available during my care of the patient were reviewed by me and considered in my medical decision making (see chart for details).   Patient presents emergency department for left hip pain after a fall.  Patient has moderate tenderness to left hip with pain with range of motion.  Neurovascular intact distally 2+ DP pulse and intact/normal sensation.  Labs are largely nonrevealing.  Chest x-ray negative, hip x-ray confirms left hip fracture.  I discussed patient with Dr. Roland Rack of orthopedics.  Patient admitted to Dr. Marcille Blanco.  Yolanda Turner was evaluated in Emergency Department on 11/28/2018 for the symptoms described in the history of present illness. She was evaluated  in the context of the global COVID-19 pandemic, which necessitated consideration that the patient might be at risk for infection with the SARS-CoV-2 virus that causes COVID-19. Institutional protocols and algorithms that pertain to the evaluation of patients at risk for COVID-19 are in a state of rapid change based on information released by regulatory bodies including the CDC and federal and state organizations. These policies and algorithms were followed during the patient's care in the ED.  ____________________________________________   FINAL CLINICAL IMPRESSION(S) / ED DIAGNOSES  Left hip fracture   Harvest Dark, MD 11/28/18 [phone]

## 2018-11-28 NOTE — ED Notes (Signed)
Pt's daughter updated on plan of care [place] [phone])

## 2018-11-28 NOTE — H&P (Signed)
Yolanda Turner is an 85 y.o. female.   Chief Complaint: Fall HPI: The patient with past medical history of CAD, diabetes, hypertension and history of uterine cancer presents to the emergency department after a mechanical fall.  The patient tripped and landed on her left hip.  She immediately felt pain and was unable to walk.  X-ray of the hip in the emergency department revealed severe femoral neck fracture with at least 10 mm of displacement.  Orthopedic surgery was notified before the emergency department staff, hospitalist service for admission.  Past Medical History:  Diagnosis Date  . Carotid artery stenosis   . Carotid artery stenosis   . Collagen vascular disease ([place])   . Coronary artery disease   . Diabetes mellitus without complication (HCC)    diet control  . GERD (gastroesophageal reflux disease)   . Hypertension   . Myocardial infarction ([place])   . Osteoporosis   . Peripheral vascular disease ([place])   . Pneumonia   . Renal artery stenosis ([place])   . Status post double vessel coronary artery bypass 2005  . Stroke ([place])   . Uterine cancer [facility])     Past Surgical History:  Procedure Laterality Date  . ABDOMINAL HYSTERECTOMY    . CAROTID ENDARTERECTOMY    . CATARACT EXTRACTION W/PHACO Right 09/28/2015   Procedure: CATARACT EXTRACTION PHACO AND INTRAOCULAR LENS PLACEMENT (IOC);  Surgeon: Estill Cotta, MD;  Location: [facility] ORS;  Service: Ophthalmology;  Laterality: Right;  [place] 041:18 AP% 28.6 CDE 114.94 fluid pack lot # [id number]  . CORONARY ARTERY BYPASS GRAFT    . PERIPHERAL VASCULAR CATHETERIZATION Right 01/22/2015   Procedure: Carotid PTA/Stent Intervention;  Surgeon: Katha Cabal, MD;  Location: [place] CV [facility];  Service: Cardiovascular;  Laterality: Right;  . uterine cancer surgery      Family History  Problem Relation Age of Onset  . Lung cancer Daughter    Social History:  reports that she has quit smoking. She has never used smokeless tobacco. She  reports that she does not drink alcohol or use drugs.  Allergies:  Allergies  Allergen Reactions  . Penicillins Rash    Has patient had a PCN reaction causing immediate rash, facial/tongue/throat swelling, SOB or lightheadedness with hypotension: No Has patient had a PCN reaction causing severe rash involving mucus membranes or skin necrosis: No Has patient had a PCN reaction that required hospitalization: No Has patient had a PCN reaction occurring within the last 10 years: No If all of the above answers are "NO", then may proceed with Cephalosporin use.    (Not in a hospital admission)   Results for orders placed or performed during the hospital encounter of 11/28/18 (from the past 48 hour(s))  CBC     Status: Abnormal   Collection Time: 11/28/18  4:36 AM  Result Value Ref Range   WBC 8.6 4.0 - 10.5 K/uL   RBC 4.16 3.87 - 5.11 MIL/uL   Hemoglobin 11.8 (L) 12.0 - 15.0 g/dL   HCT 37.2 36.0 - 46.0 %   MCV 89.4 80.0 - 100.0 fL   MCH 28.4 26.0 - 34.0 pg   MCHC 31.7 30.0 - 36.0 g/dL   RDW 13.3 11.5 - 15.5 %   Platelets 186 150 - 400 K/uL   nRBC 0.0 0.0 - 0.2 %    Comment: Performed at [facility], [address] [place], [place] [address]  Basic metabolic panel     Status: Abnormal   Collection Time: 11/28/18  4:36 AM  Result Value Ref Range   Sodium 140 135 - 145 mmol/L   Potassium 4.9 3.5 - 5.1 mmol/L    Comment: HEMOLYSIS AT THIS LEVEL MAY AFFECT RESULT   Chloride 103 98 - 111 mmol/L   CO2 24 22 - 32 mmol/L   Glucose, Bld 123 (H) 70 - 99 mg/dL   BUN 39 (H) 8 - 23 mg/dL   Creatinine, Ser 0.93 0.44 - 1.00 mg/dL   Calcium 9.3 8.9 - 10.3 mg/dL   GFR calc non Af Amer 54 (L) >60 mL/min   GFR calc Af Amer >60 >60 mL/min   Anion gap 13 5 - 15    Comment: Performed at [facility], [address] [place], [place] [address]  SARS Coronavirus 2 (CEPHEID - Performed in [place] hospital lab), Hosp Order     Status: None   Collection Time: 11/28/18  4:57 AM    Specimen: Nasopharyngeal Swab  Result Value Ref Range   SARS Coronavirus 2 NEGATIVE NEGATIVE    Comment: (NOTE) If result is NEGATIVE SARS-CoV-2 target nucleic acids are NOT DETECTED. The SARS-CoV-2 RNA is generally detectable in upper and lower  respiratory specimens during the acute phase of infection. The lowest  concentration of SARS-CoV-2 viral copies this assay can detect is 250  copies / mL. A negative result does not preclude SARS-CoV-2 infection  and should not be used as the sole basis for treatment or other  patient management decisions.  A negative result may occur with  improper specimen collection / handling, submission of specimen other  than nasopharyngeal swab, presence of viral mutation(s) within the  areas targeted by this assay, and inadequate number of viral copies  (<250 copies / mL). A negative result must be combined with clinical  observations, patient history, and epidemiological information. If result is POSITIVE SARS-CoV-2 target nucleic acids are DETECTED. The SARS-CoV-2 RNA is generally detectable in upper and lower  respiratory specimens dur ing the acute phase of infection.  Positive  results are indicative of active infection with SARS-CoV-2.  Clinical  correlation with patient history and other diagnostic information is  necessary to determine patient infection status.  Positive results do  not rule out bacterial infection or co-infection with other viruses. If result is PRESUMPTIVE POSTIVE SARS-CoV-2 nucleic acids MAY BE PRESENT.   A presumptive positive result was obtained on the submitted specimen  and confirmed on repeat testing.  While 2019 novel coronavirus  (SARS-CoV-2) nucleic acids may be present in the submitted sample  additional confirmatory testing may be necessary for epidemiological  and / or clinical management purposes  to differentiate between  SARS-CoV-2 and other Sarbecovirus currently known to infect humans.  If clinically  indicated additional testing with an alternate test  methodology [phone]) is advised. The SARS-CoV-2 RNA is generally  detectable in upper and lower respiratory sp ecimens during the acute  phase of infection. The expected result is Negative. Fact Sheet for Patients:  StrictlyIdeas.no Fact Sheet for Healthcare Providers: BankingDealers.co.za This test is not yet approved or cleared by the [place] FDA and has been authorized for detection and/or diagnosis of SARS-CoV-2 by FDA under an Emergency Use Authorization (EUA).  This EUA will remain in effect (meaning this test can be used) for the duration of the COVID-19 declaration under Section 564(b)(1) of the Act, 21 U.S.C. section 360bbb-3(b)(1), unless the authorization is terminated or revoked sooner. Performed at [facility], [address], [place],  [address]  Dg Chest 1 View  Result Date: 11/28/2018 CLINICAL DATA:  Fall yesterday.  Left hip fracture. EXAM: CHEST  1 VIEW COMPARISON:  One-view chest x-ray 05/02/2017 FINDINGS: Heart is enlarged. Atherosclerotic changes are noted in the aorta. Lungs are hyperexpanded consistent with COPD. Scarring at the apices is stable. There is no edema or effusion. No focal airspace disease is present. IMPRESSION: 1. No acute cardiopulmonary disease. 2. Chronic changes of COPD. 3. Aortic atherosclerosis. Electronically Signed   By: San Morelle M.D.   On: 11/28/2018 04:43   Dg Hip Unilat With Pelvis 2-3 Views Left  Result Date: 11/28/2018 CLINICAL DATA:  Fall yesterday.  Left hip pain.  Initial encounter. EXAM: DG HIP (WITH OR WITHOUT PELVIS) 2-3V LEFT COMPARISON:  None. FINDINGS: A transverse left femoral neck fracture is displaced 10 mm. Femoral head is in place and intact. No acute pelvic fractures are present. Degenerative changes are noted in the lower lumbar spine. Extensive vascular calcifications are noted.  IMPRESSION: 1. Transverse left femoral neck fracture with at least 10 mm displacement. 2. No additional fractures. 3. Atherosclerosis. Electronically Signed   By: San Morelle M.D.   On: 11/28/2018 04:42    Review of Systems  Constitutional: Negative for chills and fever.  HENT: Negative for sore throat and tinnitus.   Eyes: Negative for blurred vision and redness.  Respiratory: Negative for cough and shortness of breath.   Cardiovascular: Negative for chest pain, palpitations, orthopnea and PND.  Gastrointestinal: Negative for abdominal pain, diarrhea, nausea and vomiting.  Genitourinary: Negative for dysuria, frequency and urgency.  Musculoskeletal: Positive for falls and joint pain. Negative for myalgias.  Skin: Negative for rash.       No lesions  Neurological: Negative for speech change, focal weakness and weakness.  Endo/Heme/Allergies: Does not bruise/bleed easily.       No temperature intolerance  Psychiatric/Behavioral: Negative for depression and suicidal ideas.    Blood pressure (!) 151/69, pulse 80, temperature 98 F (36.7 C), temperature source Oral, resp. rate 17, weight 36.3 kg, SpO2 100 %. Physical Exam  Vitals reviewed. Constitutional: She is oriented to person, place, and time. She appears well-developed and well-nourished. No distress.  HENT:  Head: Normocephalic and atraumatic.  Mouth/Throat: Oropharynx is clear and moist.  Eyes: Pupils are equal, round, and reactive to light. Conjunctivae and EOM are normal. No scleral icterus.  Neck: Normal range of motion. Neck supple. No JVD present. No tracheal deviation present. No thyromegaly present.  Cardiovascular: Normal rate, regular rhythm and normal heart sounds. Exam reveals no gallop and no friction rub.  No murmur heard. Respiratory: Effort normal and breath sounds normal.  GI: Soft. Bowel sounds are normal. She exhibits no distension. There is no abdominal tenderness.  Genitourinary:    Genitourinary  Comments: Deferred   Musculoskeletal:        General: Tenderness and deformity present. No edema.     Comments: Significant shortening and rotation of left leg  Lymphadenopathy:    She has no cervical adenopathy.  Neurological: She is alert and oriented to person, place, and time. No cranial nerve deficit. She exhibits normal muscle tone.  Skin: Skin is warm and dry. No rash noted. No erythema.  Psychiatric: She has a normal mood and affect. Her behavior is normal. Judgment and thought content normal.     Assessment/Plan This is a 85 year old female admitted for hip fracture. 1.  Hip fracture: Left femoral neck.  Manage severe pain with IV morphine.  Orthopedic surgery consulted for  possible repair.  The patient is high risk due to age and comorbidities. 2.  Hypertension: Uncontrolled; partly due to pain.  Continue metoprolol.  Labetalol as needed. 3.  CAD: Stable; resume aspirin and Plavix following surgery. 4.  Diabetes mellitus type 2: Sliding scale insulin while hospitalized. 5.  DVT prophylaxis: PlexiPulse.  Start Lovenox postoperatively 6.  GI prophylaxis: None The patient is a DNR.  Time spent on admission orders and patient care approximately 45 minutes  Harrie Foreman, MD 11/28/2018, 7:00 AM

## 2018-11-28 NOTE — ED Notes (Signed)
Pt voices good understanding of plan of care

## 2018-11-28 NOTE — Consult Note (Signed)
ORTHOPAEDIC CONSULTATION  REQUESTING PHYSICIAN: Hillary Bow, MD  Chief Complaint:   Left hip pain.  History of Present Illness: Yolanda Turner is a 85 y.o. female with multiple medical problems including coronary artery disease, carotid artery stenosis, diabetes, osteoporosis, renal artery stenosis, and peripheral vascular disease who lives with her family.  Apparently, last evening, she lost her balance while ambulating with her walker and fell onto her left side, injuring her left hip.  She was brought to the emergency room where x-rays demonstrated a displaced left femoral neck fracture.  The patient has been admitted to the hospitalist for medical stabilization prior to definitive management of this injury.  The patient denies any associated injuries.  She did not strike her head or lose consciousness.  She also denies any lightheadedness, dizziness, chest pain, shortness of breath, or other symptoms which may have precipitated her fall.  Past Medical History:  Diagnosis Date  . Carotid artery stenosis   . Carotid artery stenosis   . Collagen vascular disease ([place])   . Coronary artery disease   . Diabetes mellitus without complication (HCC)    diet control  . GERD (gastroesophageal reflux disease)   . Hypertension   . Myocardial infarction ([place])   . Osteoporosis   . Peripheral vascular disease ([place])   . Pneumonia   . Renal artery stenosis ([place])   . Status post double vessel coronary artery bypass 2005  . Stroke ([place])   . Uterine cancer [facility])    Past Surgical History:  Procedure Laterality Date  . ABDOMINAL HYSTERECTOMY    . CAROTID ENDARTERECTOMY    . CATARACT EXTRACTION W/PHACO Right 09/28/2015   Procedure: CATARACT EXTRACTION PHACO AND INTRAOCULAR LENS PLACEMENT (IOC);  Surgeon: Estill Cotta, MD;  Location: [facility] ORS;  Service: Ophthalmology;  Laterality: Right;  [place] 041:18 AP% 28.6 CDE 114.94 fluid pack  lot # [id number]  . CORONARY ARTERY BYPASS GRAFT    . PERIPHERAL VASCULAR CATHETERIZATION Right 01/22/2015   Procedure: Carotid PTA/Stent Intervention;  Surgeon: Katha Cabal, MD;  Location: [place] [facility] LAB;  Service: Cardiovascular;  Laterality: Right;  . uterine cancer surgery     Social History   Socioeconomic History  . Marital status: Widowed    Spouse name: Not on file  . Number of children: Not on file  . Years of education: Not on file  . Highest education level: Not on file  Occupational History  . Not on file  Social Needs  . Financial resource strain: Not on file  . Food insecurity    Worry: Not on file    Inability: Not on file  . Transportation needs    Medical: Not on file    Non-medical: Not on file  Tobacco Use  . Smoking status: Former Research scientist (life sciences)  . Smokeless tobacco: Never Used  Substance and Sexual Activity  . Alcohol use: No  . Drug use: No  . Sexual activity: Not on file  Lifestyle  . Physical activity    Days per week: Not on file    Minutes per session: Not on file  . Stress: Not on file  Relationships  . Social Herbalist on phone: Not on file    Gets together: Not on file    Attends religious service: Not on file    Active member of club or organization: Not on file    Attends meetings of clubs or organizations: Not on file    Relationship status: Not on file  Other Topics Concern  . Not on file  Social History Narrative  . Not on file   Family History  Problem Relation Age of Onset  . Lung cancer Daughter    Allergies  Allergen Reactions  . Penicillins Rash    Has patient had a PCN reaction causing immediate rash, facial/tongue/throat swelling, SOB or lightheadedness with hypotension: No Has patient had a PCN reaction causing severe rash involving mucus membranes or skin necrosis: No Has patient had a PCN reaction that required hospitalization: No Has patient had a PCN reaction occurring within the last 10 years: No If  all of the above answers are "NO", then may proceed with Cephalosporin use.   Prior to Admission medications   Medication Sig Start Date End Date Taking? Authorizing Provider  gabapentin (NEURONTIN) 300 MG capsule Take 600 mg by mouth at bedtime.    Yes [provider]  lisinopril (ZESTRIL) 5 MG tablet Take 5 mg by mouth daily.   Yes [provider]  metoprolol succinate (TOPROL-XL) 25 MG 24 hr tablet Take 0.5 tablets (12.5 mg total) by mouth daily. 10/19/18 12/18/18 Yes Sainani, Belia Heman, MD  simvastatin (ZOCOR) 40 MG tablet Take 40 mg by mouth daily.    Yes [provider]   Dg Chest 1 View  Result Date: 11/28/2018 CLINICAL DATA:  Fall yesterday.  Left hip fracture. EXAM: CHEST  1 VIEW COMPARISON:  One-view chest x-ray 05/02/2017 FINDINGS: Heart is enlarged. Atherosclerotic changes are noted in the aorta. Lungs are hyperexpanded consistent with COPD. Scarring at the apices is stable. There is no edema or effusion. No focal airspace disease is present. IMPRESSION: 1. No acute cardiopulmonary disease. 2. Chronic changes of COPD. 3. Aortic atherosclerosis. Electronically Signed   By: San Morelle M.D.   On: 11/28/2018 04:43   Dg Hip Unilat With Pelvis 2-3 Views Left  Result Date: 11/28/2018 CLINICAL DATA:  Fall yesterday.  Left hip pain.  Initial encounter. EXAM: DG HIP (WITH OR WITHOUT PELVIS) 2-3V LEFT COMPARISON:  None. FINDINGS: A transverse left femoral neck fracture is displaced 10 mm. Femoral head is in place and intact. No acute pelvic fractures are present. Degenerative changes are noted in the lower lumbar spine. Extensive vascular calcifications are noted. IMPRESSION: 1. Transverse left femoral neck fracture with at least 10 mm displacement. 2. No additional fractures. 3. Atherosclerosis. Electronically Signed   By: San Morelle M.D.   On: 11/28/2018 04:42    Positive ROS: All other systems have been reviewed and were otherwise negative with the  exception of those mentioned in the HPI and as above.  Physical Exam: General:  Alert, no acute distress Psychiatric:  Patient appears to be competent for consent with normal mood and affect   Cardiovascular:  No pedal edema Respiratory:  No wheezing, non-labored breathing GI:  Abdomen is soft and non-tender Skin:  No lesions in the area of chief complaint Neurologic:  Sensation intact distally Lymphatic:  No axillary or cervical lymphadenopathy  Orthopedic Exam:  Orthopedic examination is limited to the left hip and lower extremity.  The patient's left lower extremity is somewhat shortened and externally rotated as compared to the right.  Skin inspection of the left hip is unremarkable.  No swelling, erythema, ecchymosis, abrasions, or other skin abnormalities are identified.  She has mild discomfort to palpation over the lateral aspect of the left hip.  She has more severe pain with any attempted active or passive motion of the hip.  She is able to  dorsiflex and plantarflex her toes and ankle.  Sensation is intact to light touch to all distributions.  She has good capillary refill to her left foot.  X-rays:  X-rays of the pelvis and left hip are available for review and have been reviewed by myself.  These films demonstrate a displaced left femoral neck fracture.  No significant degenerative changes or lytic lesions are identified.  Assessment: Displaced left femoral neck fracture.  Plan: The treatment options have been discussed with the patient as well as with her granddaughter, [name], including both surgical and nonsurgical choices.  The patient would like to proceed with surgical intervention to include a left hip hemiarthroplasty.  This procedure has been discussed in detail with the patient and her granddaughter, as have the potential risks (including bleeding, infection, nerve and/or blood vessel injury, persistent or recurrent pain, stiffness, loosening of and/or failure of the  components, leg length inequality, dislocation, need for further surgery, blood clots, strokes, heart attacks and/or arrhythmias, etc.) and benefits.  The patient states her understanding and wishes to proceed.  A formal written consent will be the obtained by the nursing staff.  Thank you for asking me to participate in the care of this most pleasant yet unfortunate woman.  I will be happy to follow her with you.   Pascal Lux, MD  Beeper #:  [phone]  11/28/2018 8:00 AM

## 2018-11-28 NOTE — ED Notes (Signed)
Pt uprite on stretcher in exam room with no distress noted, mask in place; pt reports left hip pain after bending over and falling forward yesterday; per family pt was able to ambulate after but pain has increased thru the night; denies any other c/o or injuries; resp even/unlab, lungs clear, apical audible & regular, +BS, abd soft/nondist, +periph pulses, -edema

## 2018-11-28 NOTE — ED Notes (Signed)
Christy Chiders - pt granddaughter.  [phone]

## 2018-11-28 NOTE — ED Notes (Signed)
ED TO INPATIENT HANDOFF REPORT  ED Nurse Name and Phone #: Lattie Haw [phone]   J Name/Age/Gender Yolanda Turner 85 y.o. female Room/Bed: ED03A/ED03A  Code Status   Code Status: Prior  Home/SNF/Other Home Patient oriented to: self, place, time and situation Is this baseline? Yes   Triage Complete: Triage complete  Chief Complaint Fall, Hip pain  Triage Note Pt was bending over yesterday at 1800 to pick something up when she fell injuring her left hip. Pt is from home and ambulatory,. Daughter walked her back to bed but pt co persistent left hip pain.    Allergies Allergies  Allergen Reactions  . Penicillins Rash    Has patient had a PCN reaction causing immediate rash, facial/tongue/throat swelling, SOB or lightheadedness with hypotension: No Has patient had a PCN reaction causing severe rash involving mucus membranes or skin necrosis: No Has patient had a PCN reaction that required hospitalization: No Has patient had a PCN reaction occurring within the last 10 years: No If all of the above answers are "NO", then may proceed with Cephalosporin use.    Level of Care/Admitting Diagnosis ED Disposition    ED Disposition Condition [facility] Area: [place] [[id number]]  Level of Care: Med-Surg [16]  Covid Evaluation: Screening Protocol (No Symptoms)  Diagnosis: Hip fracture [facility]) [[id number]]  Admitting Physician: Harrie Foreman [[id number]]  Attending Physician: Harrie Foreman [[id number]]  Estimated length of stay: past midnight tomorrow  Certification:: I certify this patient will need inpatient services for at least 2 midnights  PT Class (Do Not Modify): Inpatient [101]  PT Acc Code (Do Not Modify): Private [1]       B Medical/Surgery History Past Medical History:  Diagnosis Date  . Carotid artery stenosis   . Carotid artery stenosis   . Collagen vascular disease ([place])   . Coronary artery disease   . Diabetes mellitus without  complication (HCC)    diet control  . GERD (gastroesophageal reflux disease)   . Hypertension   . Myocardial infarction ([place])   . Osteoporosis   . Peripheral vascular disease ([place])   . Pneumonia   . Renal artery stenosis ([place])   . Status post double vessel coronary artery bypass 2005  . Stroke ([place])   . Uterine cancer [facility])    Past Surgical History:  Procedure Laterality Date  . ABDOMINAL HYSTERECTOMY    . CAROTID ENDARTERECTOMY    . CATARACT EXTRACTION W/PHACO Right 09/28/2015   Procedure: CATARACT EXTRACTION PHACO AND INTRAOCULAR LENS PLACEMENT (IOC);  Surgeon: Estill Cotta, MD;  Location: [facility] ORS;  Service: Ophthalmology;  Laterality: Right;  [place] 041:18 AP% 28.6 CDE 114.94 fluid pack lot # [id number]  . CORONARY ARTERY BYPASS GRAFT    . PERIPHERAL VASCULAR CATHETERIZATION Right 01/22/2015   Procedure: Carotid PTA/Stent Intervention;  Surgeon: Katha Cabal, MD;  Location: [place] [facility] LAB;  Service: Cardiovascular;  Laterality: Right;  . uterine cancer surgery       A IV Location/Drains/Wounds Patient Lines/Drains/Airways Status   Active Line/Drains/Airways    Name:   Placement date:   Placement time:   Site:   Days:   Peripheral IV 11/28/18 Anterior;Right Forearm   11/28/18    0404    Forearm   less than 1          Intake/Output Last 24 hours No intake or output data in the 24 hours ending 11/28/18 0321  Labs/Imaging Results for orders placed or performed during  the hospital encounter of 11/28/18 (from the past 48 hour(s))  CBC     Status: Abnormal   Collection Time: 11/28/18  4:36 AM  Result Value Ref Range   WBC 8.6 4.0 - 10.5 K/uL   RBC 4.16 3.87 - 5.11 MIL/uL   Hemoglobin 11.8 (L) 12.0 - 15.0 g/dL   HCT 37.2 36.0 - 46.0 %   MCV 89.4 80.0 - 100.0 fL   MCH 28.4 26.0 - 34.0 pg   MCHC 31.7 30.0 - 36.0 g/dL   RDW 13.3 11.5 - 15.5 %   Platelets 186 150 - 400 K/uL   nRBC 0.0 0.0 - 0.2 %    Comment: Performed at [facility], [address] [place], [place] [address]  Basic metabolic panel     Status: Abnormal   Collection Time: 11/28/18  4:36 AM  Result Value Ref Range   Sodium 140 135 - 145 mmol/L   Potassium 4.9 3.5 - 5.1 mmol/L    Comment: HEMOLYSIS AT THIS LEVEL MAY AFFECT RESULT   Chloride 103 98 - 111 mmol/L   CO2 24 22 - 32 mmol/L   Glucose, Bld 123 (H) 70 - 99 mg/dL   BUN 39 (H) 8 - 23 mg/dL   Creatinine, Ser 0.93 0.44 - 1.00 mg/dL   Calcium 9.3 8.9 - 10.3 mg/dL   GFR calc non Af Amer 54 (L) >60 mL/min   GFR calc Af Amer >60 >60 mL/min   Anion gap 13 5 - 15    Comment: Performed at [facility], [address] [place], [place] [address]  SARS Coronavirus 2 (CEPHEID - Performed in [facility] hospital lab), Hosp Order     Status: None   Collection Time: 11/28/18  4:57 AM   Specimen: Nasopharyngeal Swab  Result Value Ref Range   SARS Coronavirus 2 NEGATIVE NEGATIVE    Comment: (NOTE) If result is NEGATIVE SARS-CoV-2 target nucleic acids are NOT DETECTED. The SARS-CoV-2 RNA is generally detectable in upper and lower  respiratory specimens during the acute phase of infection. The lowest  concentration of SARS-CoV-2 viral copies this assay can detect is 250  copies / mL. A negative result does not preclude SARS-CoV-2 infection  and should not be used as the sole basis for treatment or other  patient management decisions.  A negative result may occur with  improper specimen collection / handling, submission of specimen other  than nasopharyngeal swab, presence of viral mutation(s) within the  areas targeted by this assay, and inadequate number of viral copies  (<250 copies / mL). A negative result must be combined with clinical  observations, patient history, and epidemiological information. If result is POSITIVE SARS-CoV-2 target nucleic acids are DETECTED. The SARS-CoV-2 RNA is generally detectable in upper and lower  respiratory specimens dur ing the acute phase of infection.  Positive   results are indicative of active infection with SARS-CoV-2.  Clinical  correlation with patient history and other diagnostic information is  necessary to determine patient infection status.  Positive results do  not rule out bacterial infection or co-infection with other viruses. If result is PRESUMPTIVE POSTIVE SARS-CoV-2 nucleic acids MAY BE PRESENT.   A presumptive positive result was obtained on the submitted specimen  and confirmed on repeat testing.  While 2019 novel coronavirus  (SARS-CoV-2) nucleic acids may be present in the submitted sample  additional confirmatory testing may be necessary for epidemiological  and / or clinical management purposes  to differentiate between  SARS-CoV-2 and other Sarbecovirus currently known to infect humans.  If clinically indicated additional testing with an alternate test  methodology [phone]) is advised. The SARS-CoV-2 RNA is generally  detectable in upper and lower respiratory sp ecimens during the acute  phase of infection. The expected result is Negative. Fact Sheet for Patients:  StrictlyIdeas.no Fact Sheet for Healthcare Providers: BankingDealers.co.za This test is not yet approved or cleared by the [place] FDA and has been authorized for detection and/or diagnosis of SARS-CoV-2 by FDA under an Emergency Use Authorization (EUA).  This EUA will remain in effect (meaning this test can be used) for the duration of the COVID-19 declaration under Section 564(b)(1) of the Act, 21 U.S.C. section 360bbb-3(b)(1), unless the authorization is terminated or revoked sooner. Performed at [facility], [address] [place], [place] [address]    Dg Chest 1 View  Result Date: 11/28/2018 CLINICAL DATA:  Fall yesterday.  Left hip fracture. EXAM: CHEST  1 VIEW COMPARISON:  One-view chest x-ray 05/02/2017 FINDINGS: Heart is enlarged. Atherosclerotic changes are noted in the aorta. Lungs  are hyperexpanded consistent with COPD. Scarring at the apices is stable. There is no edema or effusion. No focal airspace disease is present. IMPRESSION: 1. No acute cardiopulmonary disease. 2. Chronic changes of COPD. 3. Aortic atherosclerosis. Electronically Signed   By: San Morelle M.D.   On: 11/28/2018 04:43   Dg Hip Unilat With Pelvis 2-3 Views Left  Result Date: 11/28/2018 CLINICAL DATA:  Fall yesterday.  Left hip pain.  Initial encounter. EXAM: DG HIP (WITH OR WITHOUT PELVIS) 2-3V LEFT COMPARISON:  None. FINDINGS: A transverse left femoral neck fracture is displaced 10 mm. Femoral head is in place and intact. No acute pelvic fractures are present. Degenerative changes are noted in the lower lumbar spine. Extensive vascular calcifications are noted. IMPRESSION: 1. Transverse left femoral neck fracture with at least 10 mm displacement. 2. No additional fractures. 3. Atherosclerosis. Electronically Signed   By: San Morelle M.D.   On: 11/28/2018 04:42    Pending Labs [employer] (From admission, onward)    Start     Ordered   Signed and Held  TSH  Add-on,   R     Signed and Held   Signed and Held  Hemoglobin A1c  Add-on,   R     Signed and Held          Vitals/Pain Today's Vitals   11/28/18 0432 11/28/18 0500 11/28/18 0530 11/28/18 0600  BP: (!) 167/69 (!) 167/67 (!) 153/65 (!) 168/71  Pulse:  84 81 85  Resp: 15 (!) 21 17 18   Temp:      TempSrc:      SpO2:  95% 99% 96%  Weight:      PainSc:        Isolation Precautions No active isolations  Medications Medications  clindamycin (CLEOCIN) IVPB 600 mg (has no administration in time range)    Mobility walks High fall risk   Focused Assessments    R Recommendations: See Admitting Provider Note  Report given to:   Additional Notes: none

## 2018-11-28 NOTE — ED Notes (Signed)
Pt to xray via stretcher accomp by radiology

## 2018-11-29 ENCOUNTER — Inpatient Hospital Stay: Payer: Medicare Other | Admitting: Anesthesiology

## 2018-11-29 ENCOUNTER — Encounter
Admission: EM | Disposition: A | Discharge status: Home Health | Payer: Self-pay | Source: Home / Self Care | Attending: Internal Medicine

## 2018-11-29 ENCOUNTER — Other Ambulatory Visit: Payer: Self-pay

## 2018-11-29 ENCOUNTER — Inpatient Hospital Stay: Payer: Medicare Other

## 2018-11-29 ENCOUNTER — Encounter: Payer: Self-pay | Admitting: *Deleted

## 2018-11-29 HISTORY — PX: HIP ARTHROPLASTY: SHX981

## 2018-11-29 LAB — GLUCOSE, CAPILLARY
Glucose-Capillary: 100 mg/dL — ABNORMAL HIGH (ref 70–99)
Glucose-Capillary: 116 mg/dL — ABNORMAL HIGH (ref 70–99)
Glucose-Capillary: 119 mg/dL — ABNORMAL HIGH (ref 70–99)
Glucose-Capillary: 122 mg/dL — ABNORMAL HIGH (ref 70–99)
Glucose-Capillary: 139 mg/dL — ABNORMAL HIGH (ref 70–99)
Glucose-Capillary: 142 mg/dL — ABNORMAL HIGH (ref 70–99)
Glucose-Capillary: 154 mg/dL — ABNORMAL HIGH (ref 70–99)
Glucose-Capillary: 85 mg/dL (ref 70–99)

## 2018-11-29 LAB — BASIC METABOLIC PANEL
Anion gap: 13 (ref 5–15)
BUN: 30 mg/dL — ABNORMAL HIGH (ref 8–23)
CO2: 21 mmol/L — ABNORMAL LOW (ref 22–32)
Calcium: 9.3 mg/dL (ref 8.9–10.3)
Chloride: 109 mmol/L (ref 98–111)
Creatinine, Ser: 0.84 mg/dL (ref 0.44–1.00)
GFR calc Af Amer: >60 mL/min (ref >60)
GFR calc non Af Amer: >60 mL/min (ref >60)
Glucose, Bld: 146 mg/dL — ABNORMAL HIGH (ref 70–99)
Potassium: 4.7 mmol/L (ref 3.5–5.1)
Sodium: 143 mmol/L (ref 135–145)

## 2018-11-29 SURGERY — HEMIARTHROPLASTY, HIP, DIRECT ANTERIOR APPROACH, FOR FRACTURE
Anesthesia: General | Laterality: Left

## 2018-11-29 MED ORDER — DEXAMETHASONE SODIUM PHOSPHATE 10 MG/ML IJ SOLN
INTRAMUSCULAR | Status: DC | PRN
  Administered 2018-11-29: 4 mg via INTRAVENOUS

## 2018-11-29 MED ORDER — TRANEXAMIC ACID 1000 MG/10ML IV SOLN
INTRAVENOUS | Status: DC | PRN
  Administered 2018-11-29: 1000 mg via TOPICAL

## 2018-11-29 MED ORDER — BUPIVACAINE-EPINEPHRINE (PF) 0.5% -1:200000 IJ SOLN
INTRAMUSCULAR | Status: AC
  Filled 2018-11-29: qty 30

## 2018-11-29 MED ORDER — DIPHENHYDRAMINE HCL 12.5 MG/5ML PO ELIX: 12.5000 mg | ORAL_SOLUTION | ORAL | Status: DC | PRN

## 2018-11-29 MED ORDER — ROCURONIUM BROMIDE 100 MG/10ML IV SOLN
INTRAVENOUS | Status: DC | PRN
  Administered 2018-11-29: 10 mg via INTRAVENOUS
  Administered 2018-11-29: 5 mg via INTRAVENOUS
  Administered 2018-11-29: 15 mg via INTRAVENOUS

## 2018-11-29 MED ORDER — ROCURONIUM BROMIDE 50 MG/5ML IV SOLN
INTRAVENOUS | Status: AC
  Filled 2018-11-29: qty 1

## 2018-11-29 MED ORDER — CEFAZOLIN SODIUM-DEXTROSE 2-3 GM-%(50ML) IV SOLR
INTRAVENOUS | Status: DC | PRN
  Administered 2018-11-29: 2 g via INTRAVENOUS

## 2018-11-29 MED ORDER — KETAMINE HCL 50 MG/ML IJ SOLN
INTRAMUSCULAR | Status: DC | PRN
  Administered 2018-11-29: 15 mg via INTRAMUSCULAR

## 2018-11-29 MED ORDER — ACETAMINOPHEN 500 MG PO TABS
1000.0000 mg | ORAL_TABLET | Freq: Four times a day (QID) | ORAL | Status: AC
  Administered 2018-11-29 – 2018-11-30 (×3): 1000 mg via ORAL
  Filled 2018-11-29 (×4): qty 2

## 2018-11-29 MED ORDER — PROPOFOL 10 MG/ML IV BOLUS
INTRAVENOUS | Status: DC | PRN
  Administered 2018-11-29: 40 mg via INTRAVENOUS

## 2018-11-29 MED ORDER — CEFAZOLIN SODIUM-DEXTROSE 2-4 GM/100ML-% IV SOLN
INTRAVENOUS | Status: AC
  Filled 2018-11-29: qty 100

## 2018-11-29 MED ORDER — TRANEXAMIC ACID 1000 MG/10ML IV SOLN
INTRAVENOUS | Status: AC
  Filled 2018-11-29: qty 10

## 2018-11-29 MED ORDER — ONDANSETRON HCL 4 MG/2ML IJ SOLN: 4.0000 mg | Freq: Four times a day (QID) | INTRAMUSCULAR | Status: DC | PRN

## 2018-11-29 MED ORDER — PROPOFOL 500 MG/50ML IV EMUL
INTRAVENOUS | Status: AC
  Filled 2018-11-29: qty 50

## 2018-11-29 MED ORDER — ONDANSETRON HCL 4 MG PO TABS: 4.0000 mg | ORAL_TABLET | Freq: Four times a day (QID) | ORAL | Status: DC | PRN

## 2018-11-29 MED ORDER — SODIUM CHLORIDE FLUSH 0.9 % IV SOLN
INTRAVENOUS | Status: AC
  Filled 2018-11-29: qty 40

## 2018-11-29 MED ORDER — METOCLOPRAMIDE HCL 10 MG PO TABS: 5.0000 mg | ORAL_TABLET | Freq: Three times a day (TID) | ORAL | Status: DC | PRN

## 2018-11-29 MED ORDER — BUPIVACAINE LIPOSOME 1.3 % IJ SUSP
INTRAMUSCULAR | Status: AC
  Filled 2018-11-29: qty 20

## 2018-11-29 MED ORDER — PHENYLEPHRINE HCL (PRESSORS) 10 MG/ML IV SOLN
INTRAVENOUS | Status: DC | PRN
  Administered 2018-11-29 (×2): 100 ug via INTRAVENOUS

## 2018-11-29 MED ORDER — DEXAMETHASONE SODIUM PHOSPHATE 10 MG/ML IJ SOLN
INTRAMUSCULAR | Status: AC
  Filled 2018-11-29: qty 1

## 2018-11-29 MED ORDER — PROPOFOL 10 MG/ML IV BOLUS
INTRAVENOUS | Status: AC
  Filled 2018-11-29: qty 20

## 2018-11-29 MED ORDER — FENTANYL CITRATE (PF) 100 MCG/2ML IJ SOLN
INTRAMUSCULAR | Status: DC | PRN
  Administered 2018-11-29 (×4): 25 ug via INTRAVENOUS

## 2018-11-29 MED ORDER — BUPIVACAINE-EPINEPHRINE (PF) 0.5% -1:200000 IJ SOLN
INTRAMUSCULAR | Status: DC | PRN
  Administered 2018-11-29: 30 mL

## 2018-11-29 MED ORDER — FLEET ENEMA 7-19 GM/118ML RE ENEM: 1.0000 | ENEMA | Freq: Once | RECTAL | Status: DC | PRN

## 2018-11-29 MED ORDER — MAGNESIUM HYDROXIDE 400 MG/5ML PO SUSP
30.0000 mL | Freq: Every day | ORAL | Status: DC | PRN
  Administered 2018-12-01: 30 mL via ORAL
  Filled 2018-11-29: qty 30

## 2018-11-29 MED ORDER — SUGAMMADEX SODIUM 200 MG/2ML IV SOLN
INTRAVENOUS | Status: DC | PRN
  Administered 2018-11-29: 80 mg via INTRAVENOUS

## 2018-11-29 MED ORDER — ACETAMINOPHEN 10 MG/ML IV SOLN
INTRAVENOUS | Status: AC
  Filled 2018-11-29: qty 100

## 2018-11-29 MED ORDER — SUCCINYLCHOLINE CHLORIDE 20 MG/ML IJ SOLN
INTRAMUSCULAR | Status: AC
  Filled 2018-11-29: qty 1

## 2018-11-29 MED ORDER — SODIUM CHLORIDE 0.9 % IV SOLN: INTRAVENOUS | Status: DC

## 2018-11-29 MED ORDER — SUGAMMADEX SODIUM 200 MG/2ML IV SOLN
INTRAVENOUS | Status: AC
  Filled 2018-11-29: qty 2

## 2018-11-29 MED ORDER — BISACODYL 10 MG RE SUPP
10.0000 mg | Freq: Every day | RECTAL | Status: DC | PRN
  Administered 2018-12-01: 10 mg via RECTAL
  Filled 2018-11-29: qty 1

## 2018-11-29 MED ORDER — LIDOCAINE HCL (CARDIAC) PF 100 MG/5ML IV SOSY
PREFILLED_SYRINGE | INTRAVENOUS | Status: DC | PRN
  Administered 2018-11-29: 40 mg via INTRAVENOUS

## 2018-11-29 MED ORDER — ENOXAPARIN SODIUM 30 MG/0.3ML ~~LOC~~ SOLN
30.0000 mg | SUBCUTANEOUS | Status: DC
  Administered 2018-11-30 – 2018-12-02 (×3): 30 mg via SUBCUTANEOUS
  Filled 2018-11-29 (×3): qty 0.3

## 2018-11-29 MED ORDER — METOCLOPRAMIDE HCL 5 MG/ML IJ SOLN: 5.0000 mg | Freq: Three times a day (TID) | INTRAMUSCULAR | Status: DC | PRN

## 2018-11-29 MED ORDER — CLINDAMYCIN PHOSPHATE 600 MG/50ML IV SOLN
600.0000 mg | Freq: Four times a day (QID) | INTRAVENOUS | Status: AC
  Administered 2018-11-29 – 2018-11-30 (×3): 600 mg via INTRAVENOUS
  Filled 2018-11-29 (×4): qty 50

## 2018-11-29 MED ORDER — SODIUM CHLORIDE 0.9 % IR SOLN
Status: DC | PRN
  Administered 2018-11-29: 40 mL

## 2018-11-29 MED ORDER — ONDANSETRON HCL 4 MG/2ML IJ SOLN
INTRAMUSCULAR | Status: DC | PRN
  Administered 2018-11-29: 4 mg via INTRAVENOUS

## 2018-11-29 MED ORDER — ONDANSETRON HCL 4 MG/2ML IJ SOLN
INTRAMUSCULAR | Status: AC
  Filled 2018-11-29: qty 2

## 2018-11-29 MED ORDER — DOCUSATE SODIUM 100 MG PO CAPS
100.0000 mg | ORAL_CAPSULE | Freq: Two times a day (BID) | ORAL | Status: DC
  Administered 2018-11-29 – 2018-12-02 (×6): 100 mg via ORAL
  Filled 2018-11-29 (×6): qty 1

## 2018-11-29 MED ORDER — FENTANYL CITRATE (PF) 100 MCG/2ML IJ SOLN: 25.0000 ug | INTRAMUSCULAR | Status: DC | PRN

## 2018-11-29 MED ORDER — ACETAMINOPHEN 10 MG/ML IV SOLN
INTRAVENOUS | Status: DC | PRN
  Administered 2018-11-29: 500 mg via INTRAVENOUS

## 2018-11-29 MED ORDER — LIDOCAINE HCL (PF) 2 % IJ SOLN
INTRAMUSCULAR | Status: AC
  Filled 2018-11-29: qty 10

## 2018-11-29 MED ORDER — ACETAMINOPHEN 325 MG PO TABS
325.0000 mg | ORAL_TABLET | Freq: Four times a day (QID) | ORAL | Status: DC | PRN
  Administered 2018-11-30: 325 mg via ORAL
  Filled 2018-11-29: qty 1

## 2018-11-29 MED ORDER — KETAMINE HCL 50 MG/ML IJ SOLN
INTRAMUSCULAR | Status: AC
  Filled 2018-11-29: qty 10

## 2018-11-29 MED ORDER — BUPIVACAINE LIPOSOME 1.3 % IJ SUSP
INTRAMUSCULAR | Status: DC | PRN
  Administered 2018-11-29: 20 mL

## 2018-11-29 MED ORDER — ONDANSETRON HCL 4 MG/2ML IJ SOLN: 4.0000 mg | Freq: Once | INTRAMUSCULAR | Status: DC | PRN

## 2018-11-29 MED ORDER — FENTANYL CITRATE (PF) 100 MCG/2ML IJ SOLN
INTRAMUSCULAR | Status: AC
  Filled 2018-11-29: qty 2

## 2018-11-29 MED ORDER — OXYCODONE HCL 5 MG PO TABS: 5.0000 mg | ORAL_TABLET | ORAL | Status: DC | PRN

## 2018-11-29 MED ORDER — TRAMADOL HCL 50 MG PO TABS
50.0000 mg | ORAL_TABLET | Freq: Four times a day (QID) | ORAL | Status: DC | PRN
  Administered 2018-11-30 – 2018-12-02 (×3): 50 mg via ORAL
  Filled 2018-11-29 (×4): qty 1

## 2018-11-29 SURGICAL SUPPLY — 66 items
APL PRP STRL LF DISP 70% ISPRP (MISCELLANEOUS) ×1
BAG DECANTER FOR FLEXI CONT (MISCELLANEOUS) IMPLANT
BLADE SAGITTAL WIDE XTHICK NO (BLADE) ×3 IMPLANT
BLADE SURG SZ20 CARB STEEL (BLADE) ×3 IMPLANT
BNDG COHESIVE 6X5 TAN STRL LF (GAUZE/BANDAGES/DRESSINGS) ×3 IMPLANT
BOWL CEMENT MIXING ADV NOZZLE (MISCELLANEOUS) IMPLANT
CANISTER SUCT 1200ML W/VALVE (MISCELLANEOUS) ×3 IMPLANT
CANISTER SUCT 3000ML PPV (MISCELLANEOUS) ×6 IMPLANT
CHLORAPREP W/TINT 26 (MISCELLANEOUS) ×4 IMPLANT
COVER WAND RF STERILE (DRAPES) ×1 IMPLANT
DECANTER SPIKE VIAL GLASS SM (MISCELLANEOUS) ×4 IMPLANT
DRAPE IMP U-DRAPE 54X76 (DRAPES) ×6 IMPLANT
DRAPE INCISE IOBAN 66X60 STRL (DRAPES) ×3 IMPLANT
DRAPE SHEET LG 3/4 BI-LAMINATE (DRAPES) ×3 IMPLANT
DRAPE SURG 17X11 SM STRL (DRAPES) ×3 IMPLANT
DRAPE SURG 17X23 STRL (DRAPES) ×3 IMPLANT
DRSG OPSITE POSTOP 4X12 (GAUZE/BANDAGES/DRESSINGS) ×3 IMPLANT
DRSG OPSITE POSTOP 4X14 (GAUZE/BANDAGES/DRESSINGS) ×1 IMPLANT
ELECT BLADE 6.5 EXT (BLADE) ×3 IMPLANT
ELECT CAUTERY BLADE 6.4 (BLADE) ×3 IMPLANT
ELECT REM PT RETURN 9FT ADLT (ELECTROSURGICAL) ×3
ELECTRODE REM PT RTRN 9FT ADLT (ELECTROSURGICAL) ×1 IMPLANT
GAUZE PACK 2X3YD (GAUZE/BANDAGES/DRESSINGS) IMPLANT
GLOVE BIO SURGEON STRL SZ8 (GLOVE) ×6 IMPLANT
GLOVE INDICATOR 8.0 STRL GRN (GLOVE) ×3 IMPLANT
GOWN STRL REUS W/ TWL LRG LVL3 (GOWN DISPOSABLE) ×1 IMPLANT
GOWN STRL REUS W/ TWL XL LVL3 (GOWN DISPOSABLE) ×1 IMPLANT
GOWN STRL REUS W/TWL LRG LVL3 (GOWN DISPOSABLE) ×3
GOWN STRL REUS W/TWL XL LVL3 (GOWN DISPOSABLE) ×3
HEAD ENDO II MOD SZ 44 (Orthopedic Implant) ×2 IMPLANT
HOOD PEEL AWAY FLYTE STAYCOOL (MISCELLANEOUS) ×8 IMPLANT
INSERT TAPER ENDO II -3 (Orthopedic Implant) ×2 IMPLANT
IV NS 100ML SINGLE PACK (IV SOLUTION) IMPLANT
LABEL OR SOLS (LABEL) ×3 IMPLANT
MAT ABSORB  FLUID 56X50 GRAY (MISCELLANEOUS)
MAT ABSORB FLUID 56X50 GRAY (MISCELLANEOUS) ×1 IMPLANT
NDL FILTER BLUNT 18X1 1/2 (NEEDLE) ×1 IMPLANT
NDL SAFETY ECLIPSE 18X1.5 (NEEDLE) ×1 IMPLANT
NDL SPNL 20GX3.5 QUINCKE YW (NEEDLE) ×1 IMPLANT
NEEDLE FILTER BLUNT 18X 1/2SAF (NEEDLE) ×2
NEEDLE FILTER BLUNT 18X1 1/2 (NEEDLE) ×1 IMPLANT
NEEDLE HYPO 18GX1.5 SHARP (NEEDLE) ×3
NEEDLE SPNL 20GX3.5 QUINCKE YW (NEEDLE) ×3 IMPLANT
NS IRRIG 1000ML POUR BTL (IV SOLUTION) ×3 IMPLANT
PACK HIP PROSTHESIS (MISCELLANEOUS) ×3 IMPLANT
PULSAVAC PLUS IRRIG FAN TIP (DISPOSABLE) ×3
SOL .9 NS 3000ML IRR  AL (IV SOLUTION) ×4
SOL .9 NS 3000ML IRR AL (IV SOLUTION) ×2
SOL .9 NS 3000ML IRR UROMATIC (IV SOLUTION) ×2 IMPLANT
STAPLER SKIN PROX 35W (STAPLE) ×3 IMPLANT
STEM COLLARLESS PF 8X120X130 (Stem) ×2 IMPLANT
STRAP SAFETY 5IN WIDE (MISCELLANEOUS) ×3 IMPLANT
SUT ETHIBOND 2 V 37 (SUTURE) ×9 IMPLANT
SUT VIC AB 1 CT1 36 (SUTURE) ×6 IMPLANT
SUT VIC AB 2-0 CT1 (SUTURE) ×9 IMPLANT
SUT VIC AB 2-0 CT1 27 (SUTURE) ×9
SUT VIC AB 2-0 CT1 TAPERPNT 27 (SUTURE) ×3 IMPLANT
SUT VICRYL 1-0 27IN ABS (SUTURE) ×6
SUTURE VICRYL 1-0 27IN ABS (SUTURE) ×2 IMPLANT
SYR 10ML LL (SYRINGE) ×3 IMPLANT
SYR 30ML LL (SYRINGE) ×9 IMPLANT
SYR TB 1ML 27GX1/2 LL (SYRINGE) IMPLANT
TAPE TRANSPORE STRL 2 31045 (GAUZE/BANDAGES/DRESSINGS) ×3 IMPLANT
TIP BRUSH PULSAVAC PLUS 24.33 (MISCELLANEOUS) ×3 IMPLANT
TIP FAN IRRIG PULSAVAC PLUS (DISPOSABLE) ×1 IMPLANT
TRAY FOLEY MTR SLVR 16FR STAT (SET/KITS/TRAYS/PACK) ×2 IMPLANT

## 2018-11-29 NOTE — Anesthesia Post-op Follow-up Note (Signed)
Anesthesia QCDR form completed.        

## 2018-11-29 NOTE — Progress Notes (Signed)
Initial Nutrition Assessment  RD working remotely.  DOCUMENTATION CODES:   Underweight  INTERVENTION:  When diet is advanced, recommend placing patient on dysphagia 3 (mechanical soft) diet with thin liquids.  Provide Ensure Enlive po BID with diet advancement, each supplement provides 350 kcal and 20 grams of protein.   Provide Magic cup TID with meals once diet advanced, each supplement provides 290 kcal and 9 grams of protein.  Provide daily MVI.  Monitor magnesium, potassium, and phosphorus daily for at least 3 days, MD to replete as needed, as pt is at risk for refeeding syndrome.  NUTRITION DIAGNOSIS:   Increased nutrient needs related to post-op healing as evidenced by estimated needs.  GOAL:   Patient will meet greater than or equal to 90% of their needs  MONITOR:   Diet advancement, PO intake, Supplement acceptance, Labs, Weight trends, Skin, I & O's  REASON FOR ASSESSMENT:   Malnutrition Screening Tool    ASSESSMENT:   85 year old female with PMHx of CAD, PVD, hx MI, hx uterine cancer, HTN, hx CVA, GERD, DM, OP admitted after mechanical fall found to have left femoral neck fracture.   Attempted to call patient's phone for history but she was unable to answer. She is currently NPO to go to OR today. Patient is known to this RD from previous admissions. Patient has had a poor appetite. She tends to eat biscuits with gravy for breakfast, and then vegetables the rest of the day. She needs mechanical soft/chopped foods for ease of chewing. She does not tend to eat well during hospitalizations as she prefers food at home and also does not eat well when she does not feel well. Patient remains weight stable. She is currently 35.7 kg (78.8 lbs). Suspect she is malnourished, but cannot confirm without full history or NFPE.  Medications reviewed and include: Colace 100 mg BID, gabapentin, Novolog 0-9 units Q4hrs, Novolog 0-5 units QHS, lisinopril 5 mg daily, NS at 75  mL/hr.  Labs reviewed: CBG 85-119, CO2 21, BUN 30.  NUTRITION - FOCUSED PHYSICAL EXAM:  Unable to complete at this time.  Diet Order:   Diet Order            Diet NPO time specified  Diet effective midnight             EDUCATION NEEDS:   No education needs have been identified at this time  Skin:  Skin Assessment: Reviewed RN Assessment  Last BM:  Unknown/PTA  Height:   Ht Readings from Last 1 Encounters:  11/28/18 5\' 1"  (1.549 m)   Weight:   Wt Readings from Last 1 Encounters:  11/29/18 35.7 kg   Ideal Body Weight:  47.7 kg  BMI:  Body mass index is 14.89 kg/m.  Estimated Nutritional Needs:   Kcal:  1250-1450  Protein:  60-70 grams  Fluid:  1.2-1.4 L/day  Willey Blade, MS, RD, LDN Office: [phone] Pager: [phone] After Hours/Weekend Pager: [phone]

## 2018-11-29 NOTE — Progress Notes (Signed)
[place] [facility] [place] NAME: Sareena Odeh    MR#:  [id number]  DATE OF BIRTH:  1925/10/22  SUBJECTIVE:  CHIEF COMPLAINT:   Chief Complaint  Patient presents with  . Hip Pain   Patient has expressive aphasia.  Awake and laying in bed. Hip surgery scheduled for today.  Afebrile.  REVIEW OF SYSTEMS:    Review of Systems  Unable to perform ROS: Language  Able to obtain due to aphasia  DRUG ALLERGIES:   Allergies  Allergen Reactions  . Penicillins Rash    Has patient had a PCN reaction causing immediate rash, facial/tongue/throat swelling, SOB or lightheadedness with hypotension: No Has patient had a PCN reaction causing severe rash involving mucus membranes or skin necrosis: No Has patient had a PCN reaction that required hospitalization: No Has patient had a PCN reaction occurring within the last 10 years: No If all of the above answers are "NO", then may proceed with Cephalosporin use.    VITALS:  Blood pressure (!) 156/67, pulse 95, temperature (!) 97.4 F (36.3 C), temperature source Oral, resp. rate 17, height 5\' 1"  (1.549 m), weight 35.7 kg, SpO2 100 %.  PHYSICAL EXAMINATION:   Physical Exam  GENERAL:  85 y.o.-year-old patient lying in the bed with no acute distress.  EYES: Pupils equal, round, reactive to light and accommodation. No scleral icterus. Extraocular muscles intact.  HEENT: Head atraumatic, normocephalic. Oropharynx and nasopharynx clear.  NECK:  Supple, no jugular venous distention. No thyroid enlargement, no tenderness.  LUNGS: Normal breath sounds bilaterally, no wheezing, rales, rhonchi. No use of accessory muscles of respiration.  CARDIOVASCULAR: S1, S2 normal. No murmurs, rubs, or gallops.  ABDOMEN: Soft, nontender, nondistended. Bowel sounds present. No organomegaly or mass.  EXTREMITIES: No cyanosis, clubbing or edema b/l.    NEUROLOGIC: Cranial nerves II through XII are intact. No focal Motor or sensory  deficits b/l.  Expressive aphasia PSYCHIATRIC: The patient is alert and awake SKIN: No obvious rash, lesion, or ulcer.   LABORATORY PANEL:   CBC Recent Labs  Lab 11/28/18 0436  WBC 8.6  HGB 11.8*  HCT 37.2  PLT 186   ------------------------------------------------------------------------------------------------------------------ Chemistries  Recent Labs  Lab 11/29/18 0746  NA 143  K 4.7  CL 109  CO2 21*  GLUCOSE 146*  BUN 30*  CREATININE 0.84  CALCIUM 9.3   ------------------------------------------------------------------------------------------------------------------  Cardiac Enzymes No results for input(s): TROPONINI in the last 168 hours. ------------------------------------------------------------------------------------------------------------------  RADIOLOGY:  Dg Chest 1 View  Result Date: 11/28/2018 CLINICAL DATA:  Fall yesterday.  Left hip fracture. EXAM: CHEST  1 VIEW COMPARISON:  One-view chest x-ray 05/02/2017 FINDINGS: Heart is enlarged. Atherosclerotic changes are noted in the aorta. Lungs are hyperexpanded consistent with COPD. Scarring at the apices is stable. There is no edema or effusion. No focal airspace disease is present. IMPRESSION: 1. No acute cardiopulmonary disease. 2. Chronic changes of COPD. 3. Aortic atherosclerosis. Electronically Signed   By: San Morelle M.D.   On: 11/28/2018 04:43   Dg Hip Unilat With Pelvis 2-3 Views Left  Result Date: 11/28/2018 CLINICAL DATA:  Fall yesterday.  Left hip pain.  Initial encounter. EXAM: DG HIP (WITH OR WITHOUT PELVIS) 2-3V LEFT COMPARISON:  None. FINDINGS: A transverse left femoral neck fracture is displaced 10 mm. Femoral head is in place and intact. No acute pelvic fractures are present. Degenerative changes are noted in the lower lumbar spine. Extensive vascular calcifications are noted. IMPRESSION: 1. Transverse left femoral neck  fracture with at least 10 mm displacement. 2. No additional  fractures. 3. Atherosclerosis. Electronically Signed   By: San Morelle M.D.   On: 11/28/2018 04:42     ASSESSMENT AND PLAN:   This is a 85 year old female admitted for hip fracture.  *  Left femoral neck fracture. Orthopedics consulted.  Scheduled for surgery later today. Pain medications as needed N.p.o.  physical therapy after surgery  2.  Hypertension: Uncontrolled Lisinopril and metoprolol.  Will increase dose if needed.  3.  CAD: Stable; resume aspirin and Plavix following surgery.  4.  Diabetes mellitus type 2: Sliding scale insulin while hospitalized.  5.  DVT prophylaxis: SCDs.  Medication per orthopedics after surgery  All the records are reviewed and case discussed with Care Management/Social Worker Management plans discussed with the patient, family and they are in agreement.  CODE STATUS: DNR  TOTAL TIME TAKING CARE OF THIS PATIENT: 35 minutes.   POSSIBLE D/C IN 2-3 DAYS, DEPENDING ON CLINICAL CONDITION.  Neita Carp M.D on 11/29/2018 at 11:51 AM  Between 7am to 6pm - Pager - [phone]  After 6pm go to [email or web address] - password [email or web address] [facility]  Office  [phone]  CC: Primary care physician; Albina Billet, MD  Note: This dictation was prepared with Dragon dictation along with smaller phrase technology. Any transcriptional errors that result from this process are unintentional.

## 2018-11-29 NOTE — Transfer of Care (Signed)
Immediate Anesthesia Transfer of Care Note  Patient: Yolanda Turner  Procedure(s) Performed: ARTHROPLASTY BIPOLAR HIP (HEMIARTHROPLASTY) (Left )  Patient Location: PACU  Anesthesia Type:General  Level of Consciousness: sedated  Airway & Oxygen Therapy: Patient Spontanous Breathing and Patient connected to face mask oxygen  Post-op Assessment: Report given to RN and Post -op Vital signs reviewed and stable  Post vital signs: Reviewed and stable  Last Vitals:  Vitals Value Taken Time  BP 142/79 11/29/18 1817  Temp 36.4 C 11/29/18 1815  Pulse 87 11/29/18 1822  Resp 15 11/29/18 1822  SpO2 100 % 11/29/18 1822  Vitals shown include unvalidated device data.  Last Pain:  Vitals:   11/29/18 1815  TempSrc:   PainSc: (P) Asleep         Complications: No apparent anesthesia complications

## 2018-11-29 NOTE — Op Note (Signed)
11/29/2018  6:02 PM  Patient:   Yolanda Turner  Pre-Op Diagnosis:   Displaced femoral neck fracture, left hip.  Post-Op Diagnosis:   Same.  Procedure:   Left hip unipolar hemiarthroplasty.  Surgeon:   Pascal Lux, MD  Assistant:   Cameron Proud, PA-C  Anesthesia:   GET  Findings:   As above.  Complications:   None  EBL:   150 cc  Fluids:   400 cc crystalloid  UOP:   150 cc  TT:   None  Drains:   None  Closure:   Staples  Implants:   Biomet press-fit system with a #8 laterally offset reduced proximal profile Echo femoral stem, a 44 mm outer diameter shell, and a -3 mm neck adapter.  Brief Clinical Note:   The patient is a 85 year old female who sustained the above-noted injury early yesterday morning when she apparently lost her balance and fell in her home. She presented to the emergency room where x-rays demonstrated the above-noted injury. The patient has been cleared medically and presents at this time for definitive management of the injury.  Procedure:   The patient was brought into the operating room. After adequate general endotracheal intubation and anesthesia was obtained, the patient was repositioned in the right lateral decubitus position and secured using a lateral hip positioner. The left hip and lower extremity were prepped with ChloroPrep solution before being draped sterilely. Preoperative antibiotics were administered. A timeout was performed to verify the appropriate surgical site before a standard posterior approach to the hip was made through an approximately 4-5 inch incision. The incision was carried down through the subcutaneous tissues to expose the gluteal fascia and proximal end of the iliotibial band. These structures were split the length of the incision and the Charnley self-retaining hip retractor placed. The bursal tissues were swept posteriorly to expose the short external rotators. The anterior border of the piriformis tendon was identified  and this plane developed down through the capsule to enter the joint. Abundant fracture hematoma was suctioned. A flap of tissue was elevated off the posterior aspect of the femoral neck and greater trochanter and retracted posteriorly. This flap included the piriformis tendon, the short external rotators, and the posterior capsule. The femoral head was removed in its entirety, then taken to the back table where it was measured and found to be optimally replicated by a 44 mm head. The appropriate trial head was inserted and found to demonstrate an excellent suction fit.   Attention was directed to the femoral side. The femoral neck was recut 10-12 mm above the lesser trochanter using an oscillating saw. The piriformis fossa was debrided of soft tissues before the intramedullary canal was accessed through this point using a triple step reamer. The canal was reamed sequentially beginning with a #7 tapered reamer and progressing to a #9 tapered reamer. This provided excellent circumferential chatter. A box osteotome was used to establish version before the canal was broached sequentially beginning with a #7 broach and progressing to a #8 broach. This broach seemed to be quite tight so it was elected not to proceed with the #9 broach. The #8 broach was left in place and several trial reductions performed. The permanent #8 laterally offset reduced proximal profile femoral stem was impacted into place. A repeat trial reduction was performed using both the -6 mm and -3 mm neck lengths. The -3 mm neck length demonstrated excellent stability both in extension and external rotation as well as with flexion  to 90 and internal rotation beyond 70. It also was stable in the position of sleep. The 44 mm outer diameter shell with the -3 mm neck adapter construct was put together on the back table before being impacted onto the stem of the femoral component. The Morse taper locking mechanism was verified using manual distraction  before the head was relocated and the hip placed through a range of motion with the findings as described above.  The wound was copiously irrigated with bacitracin saline solution via the jet lavage system before the peri-incisional and pericapsular tissues were injected with 30 cc of 0.5% Sensorcaine with epinephrine and 20 cc of Exparel diluted out to 60 cc with normal saline to help with postoperative analgesia. The posterior flap was reapproximated to the posterior aspect of the greater trochanter using #2 Tycron interrupted sutures placed through drill holes. Several additional #2 Tycron interrupted sutures were used to reinforce this layer of closure. The iliotibial band was reapproximated using #1 Vicryl interrupted sutures before the gluteal fascia was closed using a running #1 Vicryl suture. At this point, 1 g of transexemic acid in 10 cc of normal saline was injected into the joint to help reduce postoperative bleeding. The subcutaneous tissues were closed in several layers using 2-0 Vicryl interrupted sutures before the skin was closed using staples. A sterile occlusive dressing was applied to the wound before the patient was placed into an abduction wedge pillow. The patient was then rolled back into the supine position on the hospital bed before being awakened, extubated, and returned to the recovery room in satisfactory condition after tolerating the procedure well.

## 2018-11-29 NOTE — Progress Notes (Signed)
   11/29/18 1500  Clinical Encounter Type  Visited With Patient and family together  Visit Type Follow-up  Referral From 59  Ch assisted the pt in completing an AD. It has been notarized and the copy has been placed in the pt chart.

## 2018-11-29 NOTE — Progress Notes (Signed)
   11/29/18 1300  Clinical Encounter Type  Visited With Patient and family together  Visit Type Follow-up  Referral From Nurse  Consult/Referral To Chaplain   Chaplain received a referral from the on-call chaplain to assist with the completion of an AD. Upon arrival, the patient was laying in bed with the lights off. Her granddaughter was at the bedside and she indicated that the patient is scheduled for surgery this afternoon. She also stated that the patient made clear before being transported to the hospital for this admission that she wanted her granddaughter to make sure that things were taken care of with her paperwork. Granddaughter expressed that the family understands that the surgery is "routine", but given the patient's age and health concerns, there may be complications. Chaplain left the patient and granddaughter to complete the AD documentation. They will ask the patient's nurse to call or page when ready to proceed.

## 2018-11-29 NOTE — Anesthesia Postprocedure Evaluation (Signed)
Anesthesia Post Note  Patient: Yolanda Turner  Procedure(s) Performed: ARTHROPLASTY BIPOLAR HIP (HEMIARTHROPLASTY) (Left )  Patient location during evaluation: PACU Anesthesia Type: General Level of consciousness: awake and alert Pain management: pain level controlled Vital Signs Assessment: post-procedure vital signs reviewed and stable Respiratory status: spontaneous breathing, nonlabored ventilation, respiratory function stable and patient connected to nasal cannula oxygen Cardiovascular status: blood pressure returned to baseline and stable Postop Assessment: no apparent nausea or vomiting Anesthetic complications: no     Last Vitals:  Vitals:   11/29/18 1530 11/29/18 1815  BP: (!) 136/52 (!) 142/79  Pulse: 92 86  Resp: 16 17  Temp: (!) 38.2 C 36.4 C  SpO2: 96% 99%    Last Pain:  Vitals:   11/29/18 1815  TempSrc:   PainSc: [place]

## 2018-11-29 NOTE — Anesthesia Procedure Notes (Signed)
Procedure Name: Intubation Date/Time: 11/29/2018 4:32 PM Performed by: Dionne Bucy, CRNA Pre-anesthesia Checklist: Patient identified, Patient being monitored, Timeout performed, Emergency Drugs available and Suction available Patient Re-evaluated:Patient Re-evaluated prior to induction Oxygen Delivery Method: Circle system utilized Preoxygenation: Pre-oxygenation with 100% oxygen Induction Type: IV induction Ventilation: Mask ventilation without difficulty Laryngoscope Size: Mac and 3 Grade View: Grade I Tube type: Oral Tube size: 6.5 mm Number of attempts: 1 Airway Equipment and Method: Stylet Placement Confirmation: ETT inserted through vocal cords under direct vision,  positive ETCO2 and breath sounds checked- equal and bilateral Secured at: 20 cm Tube secured with: Tape Dental Injury: Teeth and Oropharynx as per pre-operative assessment

## 2018-11-29 NOTE — Anesthesia Preprocedure Evaluation (Signed)
Anesthesia Evaluation  Patient identified by MRN, date of birth, ID band Patient awake    Reviewed: Allergy & Precautions, H&P , NPO status , Patient's Chart, lab work & pertinent test results, reviewed documented beta blocker date and time   Airway Mallampati: II  TM Distance: <3 FB Neck ROM: full    Dental  (+) Teeth Intact   Pulmonary neg pulmonary ROS, pneumonia, resolved, former smoker,    Pulmonary exam normal        Cardiovascular Exercise Tolerance: Poor hypertension, On Medications + CAD, + Past MI and + Peripheral Vascular Disease  negative cardio ROS Normal cardiovascular exam Rhythm:regular Rate:Normal     Neuro/Psych PSYCHIATRIC DISORDERS TIACVA, Residual Symptoms negative neurological ROS  negative psych ROS   GI/Hepatic negative GI ROS, Neg liver ROS, GERD  ,  Endo/Other  negative endocrine ROSdiabetes, Well Controlled, Type 1, Insulin Dependent  Renal/GU negative Renal ROS  negative genitourinary   Musculoskeletal   Abdominal   Peds  Hematology negative hematology ROS (+)   Anesthesia Other Findings Past Medical History: No date: Carotid artery stenosis No date: Carotid artery stenosis No date: Collagen vascular disease (HCC) No date: Coronary artery disease No date: Diabetes mellitus without complication (HCC)     Comment:  diet control No date: GERD (gastroesophageal reflux disease) No date: Hypertension No date: Myocardial infarction ([place]) No date: Osteoporosis No date: Peripheral vascular disease (HCC) No date: Pneumonia No date: Renal artery stenosis ([place]) 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 No date: Stroke [facility]) No date: Uterine cancer ([place]) Past Surgical History: No date: ABDOMINAL HYSTERECTOMY No date: CAROTID ENDARTERECTOMY 09/28/2015: CATARACT EXTRACTION W/PHACO; Right     Comment:  Procedure: CATARACT EXTRACTION PHACO AND INTRAOCULAR               LENS  PLACEMENT (IOC);  Surgeon: Estill Cotta, MD;                Location: [facility] ORS;  Service: Ophthalmology;  Laterality:              Right;  [place] 041:18 AP% 28.6 CDE 114.94 fluid pack lot #              [id number] No date: CORONARY ARTERY BYPASS GRAFT 01/22/2015: PERIPHERAL VASCULAR CATHETERIZATION; Right     Comment:  Procedure: Carotid PTA/Stent Intervention;  Surgeon:               Katha Cabal, MD;  Location: [place] CV LAB;                Service: Cardiovascular;  Laterality: Right; No date: uterine cancer surgery BMI    Body Mass Index: 14.89 kg/m     Reproductive/Obstetrics negative OB ROS                             Anesthesia Physical Anesthesia Plan  ASA: IV and emergent  Anesthesia Plan: General ETT   Post-op Pain Management:    Induction:   PONV Risk Score and Plan:   Airway Management Planned:   Additional Equipment:   Intra-op Plan:   Post-operative Plan:   Informed Consent: I have reviewed the patients History and Physical, chart, labs and discussed the procedure including the risks, benefits and alternatives for the proposed anesthesia with the patient or authorized representative who has indicated his/her understanding and acceptance.     Dental Advisory Given  Plan Discussed with: CRNA  Anesthesia  Plan Comments:         Anesthesia Quick Evaluation

## 2018-11-30 ENCOUNTER — Encounter: Payer: Self-pay | Admitting: Surgery

## 2018-11-30 LAB — CBC WITH DIFFERENTIAL/PLATELET
Abs Immature Granulocytes: 0.05 10*3/uL (ref 0.00–0.07)
Basophils Absolute: 0 10*3/uL (ref 0.0–0.1)
Basophils Relative: 0 %
Eosinophils Absolute: 0 10*3/uL (ref 0.0–0.5)
Eosinophils Relative: 0 %
HCT: 29.7 % — ABNORMAL LOW (ref 36.0–46.0)
Hemoglobin: 9.3 g/dL — ABNORMAL LOW (ref 12.0–15.0)
Immature Granulocytes: 1 %
Lymphocytes Relative: 4 %
Lymphs Abs: 0.4 10*3/uL — ABNORMAL LOW (ref 0.7–4.0)
MCH: 28.9 pg (ref 26.0–34.0)
MCHC: 31.3 g/dL (ref 30.0–36.0)
MCV: 92.2 fL (ref 80.0–100.0)
Monocytes Absolute: 0.8 10*3/uL (ref 0.1–1.0)
Monocytes Relative: 7 %
Neutro Abs: 8.9 10*3/uL — ABNORMAL HIGH (ref 1.7–7.7)
Neutrophils Relative %: 88 %
Platelets: 164 10*3/uL (ref 150–400)
RBC: 3.22 MIL/uL — ABNORMAL LOW (ref 3.87–5.11)
RDW: 13.6 % (ref 11.5–15.5)
WBC: 10.1 10*3/uL (ref 4.0–10.5)
nRBC: 0 % (ref 0.0–0.2)

## 2018-11-30 LAB — GLUCOSE, CAPILLARY
Glucose-Capillary: 108 mg/dL — ABNORMAL HIGH (ref 70–99)
Glucose-Capillary: 115 mg/dL — ABNORMAL HIGH (ref 70–99)
Glucose-Capillary: 128 mg/dL — ABNORMAL HIGH (ref 70–99)
Glucose-Capillary: 137 mg/dL — ABNORMAL HIGH (ref 70–99)
Glucose-Capillary: 144 mg/dL — ABNORMAL HIGH (ref 70–99)
Glucose-Capillary: 144 mg/dL — ABNORMAL HIGH (ref 70–99)
Glucose-Capillary: 165 mg/dL — ABNORMAL HIGH (ref 70–99)
Glucose-Capillary: 76 mg/dL (ref 70–99)

## 2018-11-30 NOTE — TOC Progression Note (Signed)
Transition of Care [facility]) - Progression Note    Patient Details  Name: Yolanda Turner MRN: [id number] Date of Birth: 1926/03/09  [facility] CM/SW Contact  Bernisha Verma, Lenice Llamas Phone Number: [phone]  11/30/2018, 4:06 PM  Clinical Narrative: Clinical Social Worker ([place]) contacted patient's daughter Yolanda Turner to discuss D/C plan. CSW made daughter aware that PT is recommending SNF. Per daughter she does not want patient to go to SNF and wants to bring her home. Per daughter patient will have 24/7 care between her 5 adult children and granddaughter that lives with her. CSW offered home health choice and daughter did not have a preference so CSW set her up with [facility]. Per [facility] representative he can accept patient. Per daughter patient has a walker, wheel chair and cane at home. Daughter requested bedside commode. [employer] agency representative is aware of above. CSW will continue to follow and assist as needed.     Expected Discharge Plan: [place] Barriers to Discharge: Continued Medical Work up  Expected Discharge Plan and Services Expected Discharge Plan: [place] In-house Referral: Clinical Social Work Discharge Planning Services: CM Consult   Living arrangements for the past 2 months: Single Family Home                                       Social Determinants of Health (SDOH) Interventions    Readmission Risk Interventions No flowsheet data found.

## 2018-11-30 NOTE — Progress Notes (Signed)
Physical Therapy Treatment Patient Details Name: Yolanda Turner MRN: [id number] DOB: [birth year] Today's Date: 11/30/2018    History of Present Illness Yolanda Turner is a 43yoF who comes to [facility] after mechanical fall, now s/p Lt HHA, WBAT, posterior approach. PTA pt was AMB household distances with intermittent QC use, only 1 prior near-fall episode in past 6 months. HAs some resideual expressive aphasia since remote CVA.    PT Comments    Pt back in bed upon entry, nearly asleep but rouses to greeting. Agreeable to PT session. Pt has more pain with exercises in bed, but improves tolerance rapidly through repetition. MinA required for most activities but bridging is most limited tolerating only low level activation. Of note RLE is not particularly strong either when performing SLR and combined knee/hip extension. Pt made comfortable at end of session to allow for nap.    Follow Up Recommendations  SNF;Home health PT;Supervision for mobility/OOB;Other (comment)     Equipment Recommendations  Wheelchair (measurements PT);Wheelchair cushion (measurements PT);Other (comment)(anti tippers, elevated leg rests; would be more appropriate with youth WC or lightweight WC)    Recommendations for Other Services       Precautions / Restrictions Precautions Precautions: Fall;Posterior Hip Restrictions LLE Weight Bearing: Weight bearing as tolerated    Mobility  Bed Mobility Overal bed mobility: Needs Assistance Bed Mobility: Supine to Sit     Supine to sit: Mod assist     General bed mobility comments: pt already back to bed via nursing, PM session focus on exercise program  Transfers Overall transfer level: Needs assistance Equipment used: Rolling walker (2 wheeled) Transfers: Sit to/from [facility] Sit to Stand: Min assist Stand pivot transfers: Max assist(tries to take steps but legs give out a few times)       General transfer comment: 4x practice, by 3rd and 4th  time pt able to perform with minA, but never able to estblish balance, continued retropulsion (mild)  Ambulation/Gait                 Stairs             Wheelchair Mobility    Modified Rankin (Stroke Patients Only)       Balance Overall balance assessment: Needs assistance Sitting-balance support: No upper extremity supported;Feet supported Sitting balance-Leahy Scale: Good   Postural control: Posterior lean Standing balance support: Bilateral upper extremity supported;During functional activity Standing balance-Leahy Scale: Zero                              Cognition Arousal/Alertness: Awake/alert Behavior During Therapy: WFL for tasks assessed/performed Overall Cognitive Status: Within Functional Limits for tasks assessed                                        Exercises Total Joint Exercises Ankle Circles/Pumps: AROM;Both;15 reps;Limitations Ankle Circles/Pumps Limitations: LLE ankle DF is limited Short Arc Quad: AAROM;Left;Supine;Limitations;15 reps Short Arc Quad Limitations: minA needed for full range Heel Slides: AAROM;Left;15 reps;Supine;Limitations Heel Slides Limitations: ModA needed Hip ABduction/ADduction: AAROM;Left;15 reps;Supine;Limitations Hip Abduction/Adduction Limitations: MinA needed Straight Leg Raises: Right;10 reps;Supine;Limitations Straight Leg Raises Limitations: minA needed and poor knee control Bridges: Strengthening;Both;10 reps;Supine;Limitations Bridges Limitations: pain very limting, unable to clear matress, but activation visualized. General Exercises - Lower Extremity Mini-Sqauts: Strengthening;Right;10 reps;Supine(manually resisted Rt leg press;)    General Comments  Pertinent Vitals/Pain Pain Assessment: Faces Faces Pain Scale: Hurts little more Pain Location: more grimacing with exercises than this morning Pain Descriptors / Indicators: Aching Pain Intervention(s): Limited  activity within patient's tolerance;Monitored during session;Repositioned(pt refuses pain meds when asked)    Home Living Family/patient expects to be discharged to:: Private residence Living Arrangements: Children;Other relatives(Son (has CP) and her Granddaughter) Available Help at Discharge: Family;Available 24 hours/day Type of Home: House Home Access: Ramped entrance   Home Layout: One level Home Equipment: Walker - 2 wheels;Tub bench;Cane - quad;Bedside commode Additional Comments: does not wear O2 at home    Prior Function Level of Independence: Needs assistance  Gait / Transfers Assistance Needed: intermittent use of quad cane; 2 falls/close calls in 6 months; almost exclusively a homebody self selectively, does not go out much. ADL's / Homemaking Assistance Needed: independent with ADL, still performing housework ad lib Comments: Since her stroke (several years)  she had chronic expressive aphasia, now with broken sentenses; chronic inability to write since CVA.   PT Goals (current goals can now be found in the care plan section) Acute Rehab PT Goals Patient Stated Goal: go home, walk again PT Goal Formulation: With patient Time For Goal Achievement: 12/14/18 Potential to Achieve Goals: Fair Progress towards PT goals: Progressing toward goals    Frequency    BID      PT Plan Current plan remains appropriate    Co-evaluation              AM-PAC PT "6 Clicks" Mobility   Outcome Measure  Help needed turning from your back to your side while in a flat bed without using bedrails?: A Lot Help needed moving from lying on your back to sitting on the side of a flat bed without using bedrails?: A Lot Help needed moving to and from a bed to a chair (including a wheelchair)?: Total Help needed standing up from a chair using your arms (e.g., wheelchair or bedside chair)?: A Little Help needed to walk in hospital room?: Total Help needed climbing 3-5 steps with a  railing? : Total 6 Click Score: 10    End of Session Equipment Utilized During Treatment: Gait belt Activity Tolerance: Patient tolerated treatment well;No increased pain Patient left: with call bell/phone within reach;with SCD's reapplied;in bed;with bed alarm set Nurse Communication: Mobility status PT Visit Diagnosis: Unsteadiness on feet (R26.81);Difficulty in walking, not elsewhere classified (R26.2)     Time: 8592-9244 PT Time Calculation (min) (ACUTE ONLY): 17 min  Charges:  $Therapeutic Exercise: 8-22 mins                     3:59 PM, 11/30/18 Etta Grandchild, PT, DPT Physical Therapist - [facility]  [phone] ([place])    [place] C 11/30/2018, 3:56 PM

## 2018-11-30 NOTE — Evaluation (Addendum)
Physical Therapy Evaluation Patient Details Name: Yolanda Turner MRN: [id number] DOB: 03/30/1926 Today's Date: 11/30/2018   History of Present Illness  Yolanda Turner is a 27yoF who comes to [facility] after mechanical fall, now s/p Lt HHA, WBAT, posterior approach. PTA pt was AMB household distances with intermittent QC use, only 1 prior near-fall episode in past 6 months. HAs some resideual expressive aphasia since remote CVA.  Clinical Impression  Pt admitted with above diagnosis. Pt currently with functional limitations due to the deficits listed below (see "PT Problem List"). Upon entry, pt in bed, easily made awake and agreeable to participate. HCPOA Christy (pt's GrandDTR) is in room. The patient remains alert, pleasant, minimally verbal and hypophonic when speaking. PLOF obtained from [place]. Good tolerance to bed level exercises. Physical assistance required for AMB Functional mobility assessment demonstrates increased effort/time requirements, poor tolerance, and need for physical assistance, whereas the patient performed these at a higher level of independence PTA. Pt is unable to AMB at this time and will need a WC at DC. Pt will benefit from skilled PT intervention to increase independence and safety with basic mobility in preparation for discharge to the venue listed below.       Follow Up Recommendations SNF;Home health PT;Supervision for mobility/OOB;Other (comment)(HCPOA refuses facility placement, elects to take pt home.)    Equipment Recommendations  Wheelchair (measurements PT);Wheelchair cushion (measurements PT)(elevated leg rests and anti tipper devices; Pt could do well with a lightweight or youth WC)    Recommendations for Other Services       Precautions / Restrictions Precautions Precautions: Fall;Posterior Hip Restrictions LLE Weight Bearing: Weight bearing as tolerated      Mobility  Bed Mobility Overal bed mobility: Needs Assistance Bed Mobility: Supine to Sit      Supine to sit: Mod assist     General bed mobility comments: assist with legs, min-modA with trunk while pt pulls self into flexion  Transfers Overall transfer level: Needs assistance Equipment used: Rolling walker (2 wheeled) Transfers: Sit to/from [facility] Sit to Stand: Min assist Stand pivot transfers: Max assist(tries to take steps but legs give out a few times)       General transfer comment: 4x practice, by 3rd and 4th time pt able to perform with minA, but never able to estblish balance, continued retropulsion (mild)  Ambulation/Gait                Stairs            Wheelchair Mobility    Modified Rankin (Stroke Patients Only)       Balance Overall balance assessment: Needs assistance Sitting-balance support: No upper extremity supported;Feet supported Sitting balance-Leahy Scale: Good   Postural control: Posterior lean Standing balance support: Bilateral upper extremity supported;During functional activity Standing balance-Leahy Scale: Zero                               Pertinent Vitals/Pain Pain Assessment: Faces Faces Pain Scale: Hurts even more Pain Location: only with weight bearing and stepping, well controlled during be dlevel activity Pain Descriptors / Indicators: Aching Pain Intervention(s): Limited activity within patient's tolerance;Monitored during session;Premedicated before session;Repositioned    Home Living Family/patient expects to be discharged to:: Private residence Living Arrangements: Children;Other relatives(Son (has CP) and her Granddaughter) Available Help at Discharge: Family;Available 24 hours/day Type of Home: House Home Access: Ramped entrance     Home Layout: One level Home Equipment:  Walker - 2 wheels;Tub bench;Cane - quad;Bedside commode Additional Comments: does not wear O2 at home    Prior Function Level of Independence: Needs assistance   Gait / Transfers Assistance  Needed: intermittent use of quad cane; 2 falls/close calls in 6 months; almost exclusively a homebody self selectively, does not go out much.  ADL's / Homemaking Assistance Needed: independent with ADL, still performing housework ad lib  Comments: Since her stroke (several years)  she had chronic expressive aphasia, now with broken sentenses; chronic inability to write since CVA.     Hand Dominance   Dominant Hand: Right(can self feed, does not write (likely related to aphasia))    Extremity/Trunk Assessment                Communication   Communication: Expressive difficulties  Cognition Arousal/Alertness: Awake/alert Behavior During Therapy: WFL for tasks assessed/performed Overall Cognitive Status: Within Functional Limits for tasks assessed                                        General Comments      Exercises Total Joint Exercises Ankle Circles/Pumps: AROM;Both;15 reps;Limitations Ankle Circles/Pumps Limitations: LLE ankle DF is limited Short Arc Quad: AAROM;Left;10 reps;Supine;Limitations Short Arc Quad Limitations: PT does less than 25% of effort, pain well controlled. Heel Slides: AAROM;Left;15 reps;Supine;Limitations Heel Slides Limitations: PT does less than 25% of effort, pain well controlled. Hip ABduction/ADduction: AAROM;Left;15 reps;Supine;Limitations Hip Abduction/Adduction Limitations: PT does less than 25% of effort, pain well controlled.   Assessment/Plan    PT Assessment Patient needs continued PT services  PT Problem List Decreased strength;Decreased range of motion;Decreased activity tolerance;Decreased balance;Decreased mobility;Decreased coordination;Decreased cognition;Decreased knowledge of use of DME;Decreased safety awareness       PT Treatment Interventions DME instruction;Gait training;Balance training;Functional mobility training;Therapeutic activities;Therapeutic exercise;Patient/family education    PT Goals (Current  goals can be found in the Care Plan section)  Acute Rehab PT Goals Patient Stated Goal: go home, walk again PT Goal Formulation: With patient Time For Goal Achievement: 12/14/18 Potential to Achieve Goals: Fair    Frequency BID   Barriers to discharge        Co-evaluation               AM-PAC PT "6 Clicks" Mobility  Outcome Measure Help needed turning from your back to your side while in a flat bed without using bedrails?: A Lot Help needed moving from lying on your back to sitting on the side of a flat bed without using bedrails?: A Lot Help needed moving to and from a bed to a chair (including a wheelchair)?: Total Help needed standing up from a chair using your arms (e.g., wheelchair or bedside chair)?: A Little Help needed to walk in hospital room?: Total Help needed climbing 3-5 steps with a railing? : Total 6 Click Score: 10    End of Session Equipment Utilized During Treatment: Gait belt Activity Tolerance: Patient tolerated treatment well;Patient limited by fatigue;Patient limited by pain Patient left: in chair;with family/visitor present;with call bell/phone within reach;with SCD's reapplied Nurse Communication: Mobility status PT Visit Diagnosis: Unsteadiness on feet (R26.81);Difficulty in walking, not elsewhere classified (R26.2)    Time: 3419-3790 PT Time Calculation (min) (ACUTE ONLY): 47 min   Charges:   PT Evaluation $PT Eval Low Complexity: 1 Low PT Treatments $Therapeutic Exercise: 23-37 mins        1:40 PM, 11/30/18  Etta Grandchild, PT, DPT Physical Therapist - [facility]  [phone] ([place])   [place] C 11/30/2018, 1:35 PM

## 2018-11-30 NOTE — Care Management Important Message (Signed)
Important Message  Patient Details  Name: Yolanda Turner MRN: [id number] Date of Birth: 30-Jan-1926   Medicare Important Message Given:  Yes     Dannette Barbara 11/30/2018, 10:20 AM

## 2018-11-30 NOTE — Progress Notes (Signed)
Subjective: 1 Day Post-Op Procedure(s) (LRB): ARTHROPLASTY BIPOLAR HIP (HEMIARTHROPLASTY) (Left) Patient reports pain as resting comfortably when I enter the room.  In a deep sleep but can be awakened. Pt and care management to assist with discharge planning. Fever: no Gastrointestinal:Negative for nausea and vomiting  Objective: Vital signs in last 24 hours: Temp:  [97.4 F (36.3 C)-100.8 F (38.2 C)] 98.6 F (37 C) (06/19 0422) Pulse Rate:  [79-95] 85 (06/19 0422) Resp:  [14-18] 18 (06/19 0422) BP: (114-158)/(47-79) 135/56 (06/19 0422) SpO2:  [94 %-100 %] 96 % (06/19 0422) Weight:  [35.7 kg-36 kg] 36 kg (06/19 0422)  Intake/Output from previous day:  Intake/Output Summary (Last 24 hours) at 11/30/2018 0713 Last data filed at 11/30/2018 0300 Gross per 24 hour  Intake 806.07 ml  Output 650 ml  Net 156.07 ml    Intake/Output this shift: No intake/output data recorded.  Labs: Recent Labs    11/28/18 0436 11/30/18 0606  HGB 11.8* 9.3*   Recent Labs    11/28/18 0436 11/30/18 0606  WBC 8.6 10.1  RBC 4.16 3.22*  HCT 37.2 29.7*  PLT 186 164   Recent Labs    11/28/18 0436 11/29/18 0746  NA 140 143  K 4.9 4.7  CL 103 109  CO2 24 21*  BUN 39* 30*  CREATININE 0.93 0.84  GLUCOSE 123* 146*  CALCIUM 9.3 9.3   No results for input(s): LABPT, INR in the last 72 hours.   EXAM General - Patient is sound asleep this morning. Extremity - Incision: dressing C/D/I No cellulitis present Compartment soft Dressing/Incision - Minimal bloody tinged drainage this morning. Motor Function - intact, moving foot and toes well on exam.  Past Medical History:  Diagnosis Date  . Carotid artery stenosis   . Carotid artery stenosis   . Collagen vascular disease ([place])   . Coronary artery disease   . Diabetes mellitus without complication (HCC)    diet control  . GERD (gastroesophageal reflux disease)   . Hypertension   . Myocardial infarction ([place])   . Osteoporosis   .  Peripheral vascular disease ([place])   . Pneumonia   . Renal artery stenosis ([place])   . Status post double vessel coronary artery bypass 2005  . Stroke ([place])   . Uterine cancer (HCC)     Assessment/Plan: 1 Day Post-Op Procedure(s) (LRB): ARTHROPLASTY BIPOLAR HIP (HEMIARTHROPLASTY) (Left) Active Problems:   Hip fracture (HCC)  Estimated body mass index is 15 kg/m as calculated from the following:   Height as of this encounter: 5\' 1"  (1.549 m).   Weight as of this encounter: 36 kg. Advance diet Up with therapy D/C IV fluids when tolerating po intake.  Labs reviewed this AM. Hg 9.3, continue to monitor. WBC 10.1 Up with therapy today.  PT and Care management to assist with discharge planning. Begin working on bowel movement. Patient will likely need SNF upon discharge.  DVT Prophylaxis - Lovenox, Foot Pumps and TED hose Weight-Bearing as tolerated to left leg  J. Cameron Proud, PA-C [facility] 11/30/2018, 7:13 AM

## 2018-11-30 NOTE — Progress Notes (Addendum)
Yolanda at [place] NAME: Yolanda Turner    MR#:  [id number]  DATE OF BIRTH:  02/03/1926  SUBJECTIVE:  CHIEF COMPLAINT:   Chief Complaint  Patient presents with  . Hip Pain   Patient has expressive aphasia.  Awake and laying in bed. POD-1  REVIEW OF SYSTEMS:    Review of Systems  Unable to perform ROS: Language  Able to obtain due to aphasia  DRUG ALLERGIES:   Allergies  Allergen Reactions  . Penicillins Rash    Has patient had a PCN reaction causing immediate rash, facial/tongue/throat swelling, SOB or lightheadedness with hypotension: No Has patient had a PCN reaction causing severe rash involving mucus membranes or skin necrosis: No Has patient had a PCN reaction that required hospitalization: No Has patient had a PCN reaction occurring within the last 10 years: No If all of the above answers are "NO", then may proceed with Cephalosporin use.    VITALS:  Blood pressure (!) 131/49, pulse 73, temperature 97.8 F (36.6 C), temperature source Oral, resp. rate 16, height 5\' 1"  (1.549 m), weight 36 kg, SpO2 98 %.  PHYSICAL EXAMINATION:   Physical Exam  GENERAL:  85 y.o.-year-old patient lying in the bed with no acute distress.  EYES: Pupils equal, round, reactive to light and accommodation. No scleral icterus. Extraocular muscles intact.  HEENT: Head atraumatic, normocephalic. Oropharynx and nasopharynx clear.  NECK:  Supple, no jugular venous distention. No thyroid enlargement, no tenderness.  LUNGS: Normal breath sounds bilaterally, no wheezing, rales, rhonchi. No use of accessory muscles of respiration.  CARDIOVASCULAR: S1, S2 normal. No murmurs, rubs, or gallops.  ABDOMEN: Soft, nontender, nondistended. Bowel sounds present. No organomegaly or mass.  EXTREMITIES: No cyanosis, clubbing or edema b/l.    NEUROLOGIC: Cranial nerves II through XII are intact. No focal Motor or sensory deficits b/l.  Expressive aphasia PSYCHIATRIC:  The patient is alert and awake SKIN: No obvious rash, lesion, or ulcer.   LABORATORY PANEL:   CBC Recent Labs  Lab 11/30/18 0606  WBC 10.1  HGB 9.3*  HCT 29.7*  PLT 164   ------------------------------------------------------------------------------------------------------------------ Chemistries  Recent Labs  Lab 11/29/18 0746  NA 143  K 4.7  CL 109  CO2 21*  GLUCOSE 146*  BUN 30*  CREATININE 0.84  CALCIUM 9.3   ------------------------------------------------------------------------------------------------------------------  Cardiac Enzymes No results for input(s): TROPONINI in the last 168 hours. ------------------------------------------------------------------------------------------------------------------  RADIOLOGY:  Dg Hip Unilat W Or W/o Pelvis 2-3 Views Left  Result Date: 11/29/2018 CLINICAL DATA:  Postop EXAM: DG HIP (WITH OR WITHOUT PELVIS) 2-3V LEFT COMPARISON:  11/28/2018 FINDINGS: Status post left hip replacement with intact hardware and normal alignment. Gas in the soft tissues consistent with recent surgery. Vascular calcifications. IMPRESSION: Interval left hip replacement with expected postsurgical changes Electronically Signed   By: Donavan Foil M.D.   On: 11/29/2018 20:34     ASSESSMENT AND PLAN:   This is a 85 year old female admitted for hip fracture.  *  Left femoral neck fracture. Orthopedics consulted.  POD-1 Pain medications as needed physical therapy eval  Spoke with daughter Ms. Yolanda Turner.  They want patient at home with home health when ready for discharge.  *  Hypertension: Uncontrolled Lisinopril and metoprolol.    *  CAD: Stable; resume aspirin and Plavix following surgery.  *  Diabetes mellitus type 2: Sliding scale insulin   *  DVT prophylaxis: Lovenox  All the records are reviewed and case discussed with Care  Management/Social Worker Management plans discussed with the patient, family and they are in agreement.  CODE  STATUS: DNR  TOTAL TIME TAKING CARE OF THIS PATIENT: 35 minutes.   POSSIBLE D/C IN 2-3 DAYS, DEPENDING ON CLINICAL CONDITION.  Neita Carp M.D on 11/30/2018 at 1:08 PM  Between 7am to 6pm - Pager - [phone]  After 6pm go to [email or web address] - password [email or web address] [place] [facility]  Office  [phone]  CC: Primary care physician; Albina Billet, MD  Note: This dictation was prepared with Dragon dictation along with smaller phrase technology. Any transcriptional errors that result from this process are unintentional.

## 2018-12-01 LAB — GLUCOSE, CAPILLARY
Glucose-Capillary: 124 mg/dL — ABNORMAL HIGH (ref 70–99)
Glucose-Capillary: 139 mg/dL — ABNORMAL HIGH (ref 70–99)
Glucose-Capillary: 81 mg/dL (ref 70–99)
Glucose-Capillary: 90 mg/dL (ref 70–99)
Glucose-Capillary: 104 mg/dL — ABNORMAL HIGH (ref 70–99)

## 2018-12-01 MED ORDER — OXYCODONE HCL 5 MG PO TABS: 5.0000 mg | ORAL_TABLET | ORAL | 0 refills | Status: DC | PRN

## 2018-12-01 MED ORDER — ONDANSETRON HCL 4 MG PO TABS: 4.0000 mg | ORAL_TABLET | Freq: Four times a day (QID) | ORAL | 0 refills | Status: DC | PRN

## 2018-12-01 MED ORDER — TRAMADOL HCL 50 MG PO TABS: 50.0000 mg | ORAL_TABLET | Freq: Four times a day (QID) | ORAL | 1 refills | Status: DC | PRN

## 2018-12-01 MED ORDER — ENOXAPARIN SODIUM 40 MG/0.4ML ~~LOC~~ SOLN: 40.0000 mg | SUBCUTANEOUS | 0 refills | Status: DC

## 2018-12-01 NOTE — TOC Progression Note (Signed)
Transition of Care [facility]) - Progression Note    Patient Details  Name: MERRANDA BOLLS MRN: [id number] Date of Birth: 03-22-1926  [facility] CM/SW Contact  Onedia Vargus, Lenice Llamas Phone Number: [phone]  12/01/2018, 2:44 PM  Clinical Narrative:  Per [place] representative patient was open to [facility] for home health prior to hospital admission. Clinical Social Worker (CSW) contacted patient's granddaughter Alyse Low and made her aware of above. Alyse Low reported that she wants to stay with [facility]. Kem Kays home health representative is aware of above. CSW will continue to follow and assist as needed.        Expected Discharge Plan: [place] Barriers to Discharge: Continued Medical Work up  Expected Discharge Plan and Services Expected Discharge Plan: [place] In-house Referral: Clinical Social Work Discharge Planning Services: CM Consult   Living arrangements for the past 2 months: Single Family Home Expected Discharge Date: 12/01/18                                     Social Determinants of Health (SDOH) Interventions    Readmission Risk Interventions No flowsheet data found.

## 2018-12-01 NOTE — Discharge Instructions (Signed)
INSTRUCTIONS AFTER Surgery  o Remove items at home which could result in a fall. This includes throw rugs or furniture in walking pathways o ICE to the affected joint every three hours while awake for 30 minutes at a time, for at least the first 3-5 days, and then as needed for pain and swelling.  Continue to use ice for pain and swelling. You may notice swelling that will progress down to the foot and ankle.  This is normal after surgery.  Elevate your leg when you are not up walking on it.   o Continue to use the breathing machine you got in the hospital (incentive spirometer) which will help keep your temperature down.  It is common for your temperature to cycle up and down following surgery, especially at night when you are not up moving around and exerting yourself.  The breathing machine keeps your lungs expanded and your temperature down.   DIET:  As you were doing prior to hospitalization, we recommend a well-balanced diet.  DRESSING / WOUND CARE / SHOWERING  Dressing changes needed.  Keep dry.  Staples will be removed in 2 weeks at the [facility]  o Increase activity slowly as tolerated, but follow the weight bearing instructions below.   o No driving for 6 weeks or until further direction given by your physician.  You cannot drive while taking narcotics.  o No lifting or carrying greater than 10 lbs. until further directed by your surgeon. o Avoid periods of inactivity such as sitting longer than an hour when not asleep. This helps prevent blood clots.  o You may return to work once you are authorized by your doctor.     WEIGHT BEARING  Weightbearing as tolerated   EXERCISES Gait training and ambulation with physical therapy  CONSTIPATION  Constipation is defined medically as fewer than three stools per week and severe constipation as less than one stool per week.  Even if you have a regular bowel pattern at home, your normal regimen is likely to be disrupted  due to multiple reasons following surgery.  Combination of anesthesia, postoperative narcotics, change in appetite and fluid intake all can affect your bowels.   YOU MUST use at least one of the following options; they are listed in order of increasing strength to get the job done.  They are all available over the counter, and you may need to use some, POSSIBLY even all of these options:    Drink plenty of fluids (prune juice may be helpful) and high fiber foods Colace 100 mg by mouth twice a day  Senokot for constipation as directed and as needed Dulcolax (bisacodyl), take with full glass of water  Miralax (polyethylene glycol) once or twice a day as needed.  If you have tried all these things and are unable to have a bowel movement in the first 3-4 days after surgery call either your surgeon or your primary doctor.    If you experience loose stools or diarrhea, hold the medications until you stool forms back up.  If your symptoms do not get better within 1 week or if they get worse, check with your doctor.  If you experience "the worst abdominal pain ever" or develop nausea or vomiting, please contact the office immediately for further recommendations for treatment.   ITCHING:  If you experience itching with your medications, try taking only a single pain pill, or even half a pain pill at a time.  You can also use  Benadryl over the counter for itching or also to help with sleep.   TED HOSE STOCKINGS:  Use stockings on both legs until for at least 2 weeks or as directed by physician office. They may be removed at night for sleeping.  MEDICATIONS:  See your medication summary on the After Visit Summary that nursing will review with you.  You may have some home medications which will be placed on hold until you complete the course of blood thinner medication.  It is important for you to complete the blood thinner medication as prescribed.  PRECAUTIONS:  If you experience chest pain or shortness  of breath - call 911 immediately for transfer to the hospital emergency department.   If you develop a fever greater that 101 F, purulent drainage from wound, increased redness or drainage from wound, foul odor from the wound/dressing, or calf pain - CONTACT YOUR SURGEON.                                                   FOLLOW-UP APPOINTMENTS:  If you do not already have a post-op appointment, please call the office for an appointment to be seen by your surgeon.  Guidelines for how soon to be seen are listed in your After Visit Summary, but are typically between 1-4 weeks after surgery.  OTHER INSTRUCTIONS:     MAKE SURE YOU:   Understand these instructions.   Get help right away if you are not doing well or get worse.    Thank you for letting us be a part of your medical care team.  It is a privilege we respect greatly.  We hope these instructions will help you stay on track for a fast and full recovery!

## 2018-12-01 NOTE — TOC Progression Note (Signed)
Transition of Care [facility]) - Progression Note    Patient Details  Name: [name] MRN: [id number] Date of Birth: 08/25/1925  [facility]) CM/SW Contact  Caleah Tortorelli, Lenice Llamas Phone Number: [phone]  12/01/2018, 12:08 PM  Clinical Narrative: Clinical Social Worker (CSW) met with patient's granddaughter/ HPOA Yolanda Turner [phone] at bedside to discuss D/C plan. Per Yolanda Turner she is concerned that patient has been discharged today and stated that she is not ready for D/C today because she has not had a BM and is very weak. CSW explained to Yolanda Turner that PT is recommending SNF because patient is very weak. Christy adamantly refused SNF and stated that patient will die if she goes into a facility. Per Yolanda Turner patient has lots of caregivers including adult children and adult grandchildren. Yolanda Turner is agreeable to home health with [facility] and requested a wheel chair. Per Yolanda Turner there is a wheel chair at patient's home however it is for the patient's son. Yolanda Turner DME agency representative is aware of the need for a wheel chair and bedside commode. Per Yolanda Turner the wheel chair will be delivered to patient's home the day she discharges. Yolanda Turner is aware of above. RN aware of above.     Expected Discharge Plan: [place] Barriers to Discharge: Continued Medical Work up  Expected Discharge Plan and Services Expected Discharge Plan: [place] In-house Referral: Clinical Social Work Discharge Planning Services: CM Consult   Living arrangements for the past 2 months: Single Family Home Expected Discharge Date: 12/01/18                                     Social Determinants of Health (SDOH) Interventions    Readmission Risk Interventions No flowsheet data found.

## 2018-12-01 NOTE — Discharge Summary (Signed)
[place] [facility] [place] NAME: Maddie Brazier    MR#:  [id number]  DATE OF BIRTH:  04-Jul-1925  DATE OF ADMISSION:  11/28/2018 ADMITTING PHYSICIAN: Harrie Foreman, MD  DATE OF DISCHARGE: No discharge date for patient encounter.  PRIMARY CARE PHYSICIAN: Albina Billet, MD    ADMISSION DIAGNOSIS:  Fall [W19.XXXA] Closed fracture of left hip, initial encounter ([place]) [S72.002A]  DISCHARGE DIAGNOSIS:  Active Problems:   Hip fracture ([place])   SECONDARY DIAGNOSIS:   Past Medical History:  Diagnosis Date  . Carotid artery stenosis   . Carotid artery stenosis   . Collagen vascular disease ([place])   . Coronary artery disease   . Diabetes mellitus without complication (H[facility])    diet control  . GERD (gastroesophageal reflux disease)   . Hypertension   . Myocardial infarction ([place])   . Osteoporosis   . Peripheral vascular disease ([place])   . Pneumonia   . Renal artery stenosis ([place])   . Status post double vessel coronary artery bypass 2005  . Stroke ([place])   . Uterine cancer [facility])     HOSPITAL COURSE:   This is a 85 year old female admitted for hip fracture.  * Acute Left femoral neck fracture Stable Status post repair by orthopedic surgery, physical therapy did see patient while in house-however the patient/patient's family want the patient home with home health services status post discharge  * Hypertension Controlled on current regiment    * CAD aspirin and Plavix  * Diabetes mellitus type 2 Stable on current regimen   DISCHARGE CONDITIONS:   stable  CONSULTS OBTAINED:  Treatment Team:  Corky Mull, MD  DRUG ALLERGIES:   Allergies  Allergen Reactions  . Penicillins Rash    Has patient had a PCN reaction causing immediate rash, facial/tongue/throat swelling, SOB or lightheadedness with hypotension: No Has patient had a PCN reaction causing severe rash involving mucus membranes or skin necrosis: No Has  patient had a PCN reaction that required hospitalization: No Has patient had a PCN reaction occurring within the last 10 years: No If all of the above answers are "NO", then may proceed with Cephalosporin use.    DISCHARGE MEDICATIONS:   Allergies as of 12/01/2018      Reactions   Penicillins Rash   Has patient had a PCN reaction causing immediate rash, facial/tongue/throat swelling, SOB or lightheadedness with hypotension: No Has patient had a PCN reaction causing severe rash involving mucus membranes or skin necrosis: No Has patient had a PCN reaction that required hospitalization: No Has patient had a PCN reaction occurring within the last 10 years: No If all of the above answers are "NO", then may proceed with Cephalosporin use.      Medication List    TAKE these medications   enoxaparin 40 MG/0.4ML injection Commonly known as: LOVENOX Inject 0.4 mLs (40 mg total) into the skin daily for 14 days.   gabapentin 300 MG capsule Commonly known as: NEURONTIN Take 600 mg by mouth at bedtime.   lisinopril 5 MG tablet Commonly known as: ZESTRIL Take 5 mg by mouth daily.   metoprolol succinate 25 MG 24 hr tablet Commonly known as: TOPROL-XL Take 0.5 tablets (12.5 mg total) by mouth daily.   oxyCODONE 5 MG immediate release tablet Commonly known as: Oxy IR/ROXICODONE Take 1-2 tablets (5-10 mg total) by mouth every 4 (four) hours as needed for moderate pain (pain score 4-6).   simvastatin 40 MG tablet Commonly  known as: ZOCOR Take 40 mg by mouth daily.   traMADol 50 MG tablet Commonly known as: ULTRAM Take 1 tablet (50 mg total) by mouth every 6 (six) hours as needed for moderate pain.            Durable Medical Equipment  (From admission, onward)         Start     Ordered   12/01/18 1013  For home use only DME lightweight manual wheelchair with seat cushion  Once    Comments: Youth size wheelchair  Patient suffers from hip fracture which impairs their ability to  perform daily activities like bathing in the home.  A walker will not resolve  issue with performing activities of daily living. A wheelchair will allow patient to safely perform daily activities. Patient is not able to propel themselves in the home using a standard weight wheelchair due to arm weakness. Patient can self propel in the lightweight wheelchair. Length of need Lifetime. Accessories: elevating leg rests (ELRs), wheel locks, extensions and anti-tippers.   12/01/18 1013           DISCHARGE INSTRUCTIONS:      If you experience worsening of your admission symptoms, develop shortness of breath, life threatening emergency, suicidal or homicidal thoughts you must seek medical attention immediately by calling 911 or calling your MD immediately  if symptoms less severe.  You Must read complete instructions/literature along with all the possible adverse reactions/side effects for all the Medicines you take and that have been prescribed to you. Take any new Medicines after you have completely understood and accept all the possible adverse reactions/side effects.   Please note  You were cared for by a hospitalist during your hospital stay. If you have any questions about your discharge medications or the care you received while you were in the hospital after you are discharged, you can call the unit and asked to speak with the hospitalist on call if the hospitalist that took care of you is not available. Once you are discharged, your primary care physician will handle any further medical issues. Please note that NO REFILLS for any discharge medications will be authorized once you are discharged, as it is imperative that you return to your primary care physician (or establish a relationship with a primary care physician if you do not have one) for your aftercare needs so that they can reassess your need for medications and monitor your lab values.    Today   CHIEF COMPLAINT:   Chief  Complaint  Patient presents with  . Hip Pain    HISTORY OF PRESENT ILLNESS:   The patient with past medical history of CAD, diabetes, hypertension and history of uterine cancer presents to the emergency department after a mechanical fall.  The patient tripped and landed on her left hip.  She immediately felt pain and was unable to walk.  X-ray of the hip in the emergency department revealed severe femoral neck fracture with at least 10 mm of displacement.  Orthopedic surgery was notified before the emergency department staff, hospitalist service for admission.  VITAL SIGNS:  Blood pressure (!) 150/73, pulse 88, temperature 98.5 F (36.9 C), temperature source Oral, resp. rate 17, height 5\' 1"  (1.549 m), weight 34.3 kg, SpO2 96 %.  I/O:    Intake/Output Summary (Last 24 hours) at 12/01/2018 1016 Last data filed at 11/30/2018 1834 Gross per 24 hour  Intake 240 ml  Output 150 ml  Net 90 ml  PHYSICAL EXAMINATION:  GENERAL:  85 y.o.-year-old patient lying in the bed with no acute distress.  EYES: Pupils equal, round, reactive to light and accommodation. No scleral icterus. Extraocular muscles intact.  HEENT: Head atraumatic, normocephalic. Oropharynx and nasopharynx clear.  NECK:  Supple, no jugular venous distention. No thyroid enlargement, no tenderness.  LUNGS: Normal breath sounds bilaterally, no wheezing, rales,rhonchi or crepitation. No use of accessory muscles of respiration.  CARDIOVASCULAR: S1, S2 normal. No murmurs, rubs, or gallops.  ABDOMEN: Soft, non-tender, non-distended. Bowel sounds present. No organomegaly or mass.  EXTREMITIES: No pedal edema, cyanosis, or clubbing.  NEUROLOGIC: Cranial nerves II through XII are intact. Muscle strength 5/5 in all extremities. Sensation intact. Gait not checked.  PSYCHIATRIC: The patient is alert and oriented x 3.  SKIN: No obvious rash, lesion, or ulcer.   DATA REVIEW:   CBC Recent Labs  Lab 11/30/18 0606  WBC 10.1  HGB 9.3*   HCT 29.7*  PLT 164    Chemistries  Recent Labs  Lab 11/29/18 0746  NA 143  K 4.7  CL 109  CO2 21*  GLUCOSE 146*  BUN 30*  CREATININE 0.84  CALCIUM 9.3    Cardiac Enzymes No results for input(s): TROPONINI in the last 168 hours.  Microbiology Results  Results for orders placed or performed during the hospital encounter of 11/28/18  SARS Coronavirus 2 (CEPHEID - Performed in [place] hospital lab), Hosp Order     Status: None   Collection Time: 11/28/18  4:57 AM   Specimen: Nasopharyngeal Swab  Result Value Ref Range Status   SARS Coronavirus 2 NEGATIVE NEGATIVE Final    Comment: (NOTE) If result is NEGATIVE SARS-CoV-2 target nucleic acids are NOT DETECTED. The SARS-CoV-2 RNA is generally detectable in upper and lower  respiratory specimens during the acute phase of infection. The lowest  concentration of SARS-CoV-2 viral copies this assay can detect is 250  copies / mL. A negative result does not preclude SARS-CoV-2 infection  and should not be used as the sole basis for treatment or other  patient management decisions.  A negative result may occur with  improper specimen collection / handling, submission of specimen other  than nasopharyngeal swab, presence of viral mutation(s) within the  areas targeted by this assay, and inadequate number of viral copies  (<250 copies / mL). A negative result must be combined with clinical  observations, patient history, and epidemiological information. If result is POSITIVE SARS-CoV-2 target nucleic acids are DETECTED. The SARS-CoV-2 RNA is generally detectable in upper and lower  respiratory specimens dur ing the acute phase of infection.  Positive  results are indicative of active infection with SARS-CoV-2.  Clinical  correlation with patient history and other diagnostic information is  necessary to determine patient infection status.  Positive results do  not rule out bacterial infection or co-infection with other  viruses. If result is PRESUMPTIVE POSTIVE SARS-CoV-2 nucleic acids MAY BE PRESENT.   A presumptive positive result was obtained on the submitted specimen  and confirmed on repeat testing.  While 2019 novel coronavirus  (SARS-CoV-2) nucleic acids may be present in the submitted sample  additional confirmatory testing may be necessary for epidemiological  and / or clinical management purposes  to differentiate between  SARS-CoV-2 and other Sarbecovirus currently known to infect humans.  If clinically indicated additional testing with an alternate test  methodology [phone]) is advised. The SARS-CoV-2 RNA is generally  detectable in upper and lower respiratory sp ecimens during the acute  phase of infection. The expected result is Negative. Fact Sheet for Patients:  StrictlyIdeas.no Fact Sheet for Healthcare Providers: BankingDealers.co.za This test is not yet approved or cleared by the [place] FDA and has been authorized for detection and/or diagnosis of SARS-CoV-2 by FDA under an Emergency Use Authorization (EUA).  This EUA will remain in effect (meaning this test can be used) for the duration of the COVID-19 declaration under Section 564(b)(1) of the Act, 21 U.S.C. section 360bbb-3(b)(1), unless the authorization is terminated or revoked sooner. Performed at [facility], [address], [place], [place] [address]   Surgical PCR screen     Status: None   Collection Time: 11/28/18  9:40 AM   Specimen: Nasal Mucosa; Nasal Swab  Result Value Ref Range Status   MRSA, PCR NEGATIVE NEGATIVE Final   Staphylococcus aureus NEGATIVE NEGATIVE Final    Comment: (NOTE) The Xpert SA Assay (FDA approved for NASAL specimens in patients 45 years of age and older), is one component of a comprehensive surveillance program. It is not intended to diagnose infection nor to guide or monitor treatment. Performed at [facility],  [address] [place], [place] [address]     RADIOLOGY:  Dg Hip Unilat W Or W/o Pelvis 2-3 Views Left  Result Date: 11/29/2018 CLINICAL DATA:  Postop EXAM: DG HIP (WITH OR WITHOUT PELVIS) 2-3V LEFT COMPARISON:  11/28/2018 FINDINGS: Status post left hip replacement with intact hardware and normal alignment. Gas in the soft tissues consistent with recent surgery. Vascular calcifications. IMPRESSION: Interval left hip replacement with expected postsurgical changes Electronically Signed   By: Donavan Foil M.D.   On: 11/29/2018 20:34    EKG:   Orders placed or performed during the hospital encounter of 11/28/18  . ED EKG  . ED EKG      Management plans discussed with the patient, family and they are in agreement.  CODE STATUS:     Code Status Orders  (From admission, onward)         Start     Ordered   11/28/18 0811  Do not attempt resuscitation (DNR)  Continuous    Question Answer Comment  In the event of cardiac or respiratory ARREST Do not call a "code blue"   In the event of cardiac or respiratory ARREST Do not perform Intubation, CPR, defibrillation or ACLS   In the event of cardiac or respiratory ARREST Use medication by any route, position, wound care, and other measures to relive pain and suffering. May use oxygen, suction and manual treatment of airway obstruction as needed for comfort.      11/28/18 0810        Code Status History    Date Active Date Inactive Code Status Order ID Comments User Context   10/18/2018 1224 10/19/2018 1810 DNR [id number]  Henreitta Leber, MD Inpatient   10/16/2018 2345 10/18/2018 1224 Full Code [id number]  Lance Coon, MD Inpatient   05/07/2018 1349 05/09/2018 1612 Full Code [id number]  Bettey Costa, MD Inpatient   04/04/2017 1431 04/04/2017 2137 DNR [id number]  Asencion Gowda, NP Inpatient   04/02/2017 1837 04/04/2017 1430 Full Code [id number]  Vaughan Basta, MD Inpatient   01/22/2015 1036 01/22/2015 1522 Full Code [id number]   Schnier, Dolores Lory, MD Inpatient   Advance Care Planning Activity      TOTAL TIME TAKING CARE OF THIS PATIENT: 40 minutes.    Avel Peace Alani Sabbagh M.D on 12/01/2018 at 10:16 AM  Between 7am to 6pm - Pager -  [phone]  After 6pm go to [email or web address] - password [email or web address] [place] [facility]  Office  [phone]  CC: Primary care physician; Albina Billet, MD   Note: This dictation was prepared with Dragon dictation along with smaller phrase technology. Any transcriptional errors that result from this process are unintentional.

## 2018-12-01 NOTE — Progress Notes (Signed)
Physical Therapy Treatment Patient Details Name: Yolanda Turner MRN: [id number] DOB: Jan 02, 1926 Today's Date: 12/01/2018    History of Present Illness Yolanda Turner is a 58yoF who comes to [facility] after mechanical fall, now s/p Lt HHA, WBAT, posterior approach. PTA pt was AMB household distances with intermittent QC use, only 1 prior near-fall episode in past 6 months. HAs some resideual expressive aphasia since remote CVA.    PT Comments    Pt had returned to bed with nursing prior to session.  Participated in exercises as described below. Little assistance by pt to assist with ROM and resists at times.  Will continue as appropriate.  Follow Up Recommendations  SNF;Home health PT;Supervision for mobility/OOB;Other (comment)     Equipment Recommendations  Wheelchair (measurements PT);Wheelchair cushion (measurements PT);Other (comment)    Recommendations for Other Services       Precautions / Restrictions Precautions Precautions: Fall;Posterior Hip Restrictions Weight Bearing Restrictions: No LLE Weight Bearing: Weight bearing as tolerated    Mobility  Bed Mobility Overal bed mobility: Needs Assistance Bed Mobility: Supine to Sit     Supine to sit: HOB elevated;Min assist     General bed mobility comments: pt had returned to bed with nursing  Transfers Overall transfer level: Needs assistance Equipment used: Rolling walker (2 wheeled) Transfers: Sit to/from [facility] Sit to Stand: Mod assist Stand pivot transfers: Mod assist       General transfer comment: stand pivot x2, modA cues for knee extension, upright positioning. Able to take shuffling steps towards chair  Ambulation/Gait                 Stairs             Wheelchair Mobility    Modified Rankin (Stroke Patients Only)       Balance Overall balance assessment: Needs assistance Sitting-balance support: Feet supported Sitting balance-Leahy Scale: Fair Sitting balance  - Comments: Pt with difficulty shifting towards the L due to increased hip pain Postural control: Posterior lean Standing balance support: Bilateral upper extremity supported;During functional activity                                Cognition Arousal/Alertness: Awake/alert Behavior During Therapy: WFL for tasks assessed/performed Overall Cognitive Status: Within Functional Limits for tasks assessed                                        Exercises Total Joint Exercises Ankle Circles/Pumps: AAROM;Both;10 reps Quad Sets: AROM;Strengthening;Left;10 reps Short Arc QuadSinclair Ship;Left;Supine;Limitations;15 reps Short Arc Quad Limitations: minA needed for full ranged, but demonstrated improved muscle activation with repetition Heel Slides: AAROM;Left;15 reps;Supine;Limitations Heel Slides Limitations: minA Hip ABduction/ADduction: AAROM;Left;15 reps;Supine;Limitations Hip Abduction/Adduction Limitations: MinA needed Other Exercises Other Exercises: BLE PROM x 10 - ankle pump, heel slides, ab/add and slr    General Comments        Pertinent Vitals/Pain Pain Assessment: Faces Faces Pain Scale: Hurts little more Pain Location: with mobility Pain Descriptors / Indicators: Guarding;Grimacing Pain Intervention(s): Limited activity within patient's tolerance;Monitored during session;Repositioned    Home Living                      Prior Function            PT Goals (current goals can now be found in  the care plan section) Progress towards PT goals: Progressing toward goals    Frequency    BID      PT Plan Current plan remains appropriate    Co-evaluation              AM-PAC PT "6 Clicks" Mobility   Outcome Measure  Help needed turning from your back to your side while in a flat bed without using bedrails?: A Lot Help needed moving from lying on your back to sitting on the side of a flat bed without using bedrails?: A Lot Help  needed moving to and from a bed to a chair (including a wheelchair)?: Total Help needed standing up from a chair using your arms (e.g., wheelchair or bedside chair)?: A Lot Help needed to walk in hospital room?: Total Help needed climbing 3-5 steps with a railing? : Total 6 Click Score: 9    End of Session Equipment Utilized During Treatment: Gait belt Activity Tolerance: Patient tolerated treatment well Patient left: in bed;with call bell/phone within reach;with bed alarm set Nurse Communication: Mobility status PT Visit Diagnosis: Unsteadiness on feet (R26.81);Difficulty in walking, not elsewhere classified (R26.2)     Time: 6579-0383 PT Time Calculation (min) (ACUTE ONLY): 8 min  Charges:  $Therapeutic Exercise: 8-22 mins $Therapeutic Activity: 8-22 mins                     Chesley Noon, PTA 12/01/18, 2:09 PM

## 2018-12-01 NOTE — Progress Notes (Signed)
Subjective: 2 Days Post-Op Procedure(s) (LRB): ARTHROPLASTY BIPOLAR HIP (HEMIARTHROPLASTY) (Left) Patient reports pain as resting comfortably when I enter the room.  In a deep sleep but can be awakened. Pt and care management to assist with discharge planning. Fever: no Gastrointestinal:Negative for nausea and vomiting  Objective: Vital signs in last 24 hours: Temp:  [97.5 F (36.4 C)-97.8 F (36.6 C)] 97.5 F (36.4 C) (06/19 2247) Pulse Rate:  [73-81] 81 (06/19 2247) Resp:  [15-17] 17 (06/19 2247) BP: (116-148)/(49-73) 148/73 (06/19 2247) SpO2:  [97 %-98 %] 97 % (06/19 2247) Weight:  [34.3 kg] 34.3 kg (06/19 2247)  Intake/Output from previous day:  Intake/Output Summary (Last 24 hours) at 12/01/2018 0703 Last data filed at 11/30/2018 1834 Gross per 24 hour  Intake 445.21 ml  Output 150 ml  Net 295.21 ml    Intake/Output this shift: No intake/output data recorded.  Labs: Recent Labs    11/30/18 0606  HGB 9.3*   Recent Labs    11/30/18 0606  WBC 10.1  RBC 3.22*  HCT 29.7*  PLT 164   Recent Labs    11/29/18 0746  NA 143  K 4.7  CL 109  CO2 21*  BUN 30*  CREATININE 0.84  GLUCOSE 146*  CALCIUM 9.3   No results for input(s): LABPT, INR in the last 72 hours.   EXAM General - Patient is sound asleep this morning. Extremity - Incision: dressing C/D/I No cellulitis present Compartment soft Dressing/Incision -no drainage this morning. Motor Function - intact, moving foot and toes well on exam.  Past Medical History:  Diagnosis Date  . Carotid artery stenosis   . Carotid artery stenosis   . Collagen vascular disease ([place])   . Coronary artery disease   . Diabetes mellitus without complication (HCC)    diet control  . GERD (gastroesophageal reflux disease)   . Hypertension   . Myocardial infarction ([place])   . Osteoporosis   . Peripheral vascular disease ([place])   . Pneumonia   . Renal artery stenosis ([place])   . Status post double vessel coronary artery  bypass 2005  . Stroke ([place])   . Uterine cancer (HCC)     Assessment/Plan: 2 Days Post-Op Procedure(s) (LRB): ARTHROPLASTY BIPOLAR HIP (HEMIARTHROPLASTY) (Left) Active Problems:   Hip fracture (HCC)  Estimated body mass index is 14.29 kg/m as calculated from the following:   Height as of this encounter: 5\' 1"  (1.549 m).   Weight as of this encounter: 34.3 kg. Advance diet Up with therapy D/C IV fluids when tolerating po intake.  Up with therapy today.  PT and Care management to assist with discharge planning. Begin working on bowel movement. Patient will likely need home with family upon discharge.  DVT Prophylaxis - Lovenox, Foot Pumps and TED hose Weight-Bearing as tolerated to left leg  Reche Dixon, PA-C [facility] 12/01/2018, 7:03 AM

## 2018-12-01 NOTE — Progress Notes (Signed)
Physical Therapy Treatment Patient Details Name: Yolanda Turner MRN: [id number] DOB: 1926-02-14 Today's Date: 12/01/2018    History of Present Illness Yolanda Turner is a 89yoF who comes to [facility] after mechanical fall, now s/p Lt HHA, WBAT, posterior approach. PTA pt was AMB household distances with intermittent QC use, only 1 prior near-fall episode in past 6 months. HAs some resideual expressive aphasia since remote CVA.    PT Comments    Pt easily woken, difficulty with aphasia this morning, but able to state that she wanted to be called Yolanda Turner, and shake head yes that she is in the hospital, gestures to L hip when asked about pain. Moderate pain signs/symptoms noted with mobility this session.  Pt and PT performed bed level therapeutic exercises, AAROM for most on LLE due to weakness/pain though muscle activation may be improved from previous session. Supine to sit with minA and step by step sequencing. Pt with difficulty with weight shifting to L during sitting due to increased pain, fair balance with CGA/supervision. Sit<>stand with RW and ModA, pt unable to lift L foot to take steps. Stand pivot utilized with knee block, pt able to take shuffled steps toward chair. Pt up in chair with CNA at bedside. The patient would benefit from further skilled PT to address functional limitations, recommendation is STR.     Follow Up Recommendations  SNF;Home health PT;Supervision for mobility/OOB;Other (comment)     Equipment Recommendations  Wheelchair (measurements PT);Wheelchair cushion (measurements PT);Other (comment)    Recommendations for Other Services       Precautions / Restrictions Precautions Precautions: Fall;Posterior Hip Restrictions Weight Bearing Restrictions: No LLE Weight Bearing: Weight bearing as tolerated    Mobility  Bed Mobility Overal bed mobility: Needs Assistance Bed Mobility: Supine to Sit     Supine to sit: HOB elevated;Min assist     General bed mobility  comments: Min for LLE management and complete trunk elevation, cues for hand placement. Pt with difficulty weight shifting towards L hip due to increased pain  Transfers Overall transfer level: Needs assistance Equipment used: Rolling walker (2 wheeled) Transfers: Sit to/from [facility] Sit to Stand: Mod assist Stand pivot transfers: Mod assist       General transfer comment: stand pivot x2, modA cues for knee extension, upright positioning. Able to take shuffling steps towards chair  Ambulation/Gait                 Stairs             Wheelchair Mobility    Modified Rankin (Stroke Patients Only)       Balance Overall balance assessment: Needs assistance Sitting-balance support: Feet supported Sitting balance-Leahy Scale: Fair Sitting balance - Comments: Pt with difficulty shifting towards the L due to increased hip pain Postural control: Posterior lean Standing balance support: Bilateral upper extremity supported;During functional activity                                Cognition Arousal/Alertness: Awake/alert Behavior During Therapy: WFL for tasks assessed/performed Overall Cognitive Status: Within Functional Limits for tasks assessed                                        Exercises Total Joint Exercises Ankle Circles/Pumps: AAROM;Both;10 reps Quad Sets: AROM;Strengthening;Left;10 reps Short Arc QuadSinclair Ship;Left;Supine;Limitations;15 reps Short Arc  Quad Limitations: minA needed for full ranged, but demonstrated improved muscle activation with repetition Heel Slides: AAROM;Left;15 reps;Supine;Limitations Heel Slides Limitations: minA Hip ABduction/ADduction: AAROM;Left;15 reps;Supine;Limitations Hip Abduction/Adduction Limitations: MinA needed    General Comments        Pertinent Vitals/Pain Pain Assessment: Faces Faces Pain Scale: Hurts little more Pain Location: with mobility Pain Descriptors /  Indicators: Aching Pain Intervention(s): Limited activity within patient's tolerance;Monitored during session;Repositioned    Home Living                      Prior Function            PT Goals (current goals can now be found in the care plan section) Progress towards PT goals: Progressing toward goals    Frequency    BID      PT Plan Current plan remains appropriate    Co-evaluation              AM-PAC PT "6 Clicks" Mobility   Outcome Measure  Help needed turning from your back to your side while in a flat bed without using bedrails?: A Lot Help needed moving from lying on your back to sitting on the side of a flat bed without using bedrails?: A Lot Help needed moving to and from a bed to a chair (including a wheelchair)?: Total Help needed standing up from a chair using your arms (e.g., wheelchair or bedside chair)?: A Lot Help needed to walk in hospital room?: Total Help needed climbing 3-5 steps with a railing? : Total 6 Click Score: 9    End of Session Equipment Utilized During Treatment: Gait belt Activity Tolerance: Patient tolerated treatment well Patient left: with call bell/phone within reach;in chair;with nursing/sitter in room Nurse Communication: Mobility status PT Visit Diagnosis: Unsteadiness on feet (R26.81);Difficulty in walking, not elsewhere classified (R26.2)     Time: 9357-0177 PT Time Calculation (min) (ACUTE ONLY): 30 min  Charges:  $Therapeutic Exercise: 8-22 mins $Therapeutic Activity: 8-22 mins                     Lieutenant Diego PT, DPT 10:45 AM,12/01/18 [phone]

## 2018-12-02 LAB — GLUCOSE, CAPILLARY
Glucose-Capillary: 104 mg/dL — ABNORMAL HIGH (ref 70–99)
Glucose-Capillary: 110 mg/dL — ABNORMAL HIGH (ref 70–99)
Glucose-Capillary: 89 mg/dL (ref 70–99)
Glucose-Capillary: 91 mg/dL (ref 70–99)

## 2018-12-02 NOTE — Progress Notes (Signed)
Physical Therapy Treatment Patient Details Name: Yolanda Turner MRN: [id number] DOB: December 23, 1925 Today's Date: 12/02/2018    History of Present Illness Yolanda Turner is a 40yoF who comes to [facility] after mechanical fall, now s/p Lt HHA, WBAT, posterior approach. PTA pt was AMB household distances with intermittent QC use, only 1 prior near-fall episode in past 6 months. HAs some resideual expressive aphasia since remote CVA.    PT Comments    In bed,family in room.  Hoping to discharge home today with 24 hour family care, equipment and HHPT. Participated in exercises as described below.  To edge of bed with mod a x 1.  Stood x 2 with walker and mod a x 1.  Pericare provided in standing. She is unable to step in place.  After sitting, walker removed and stand pivot to chair.  Family in and education provided. Questions answered.     Follow Up Recommendations  Home health PT;Supervision for mobility/OOB;Other (comment)     Equipment Recommendations  Wheelchair (measurements PT);Wheelchair cushion (measurements PT);Other (comment)    Recommendations for Other Services       Precautions / Restrictions Precautions Precautions: Fall;Posterior Hip Restrictions Weight Bearing Restrictions: No LLE Weight Bearing: Weight bearing as tolerated    Mobility  Bed Mobility Overal bed mobility: Needs Assistance Bed Mobility: Supine to Sit     Supine to sit: HOB elevated;Min assist;Mod assist        Transfers Overall transfer level: Needs assistance Equipment used: Rolling walker (2 wheeled) Transfers: Sit to/from [facility] Sit to Stand: Mod assist Stand pivot transfers: Mod assist       General transfer comment: stood with walker x 2 but unable to step.  Stand pivot to recliner with mod a x 1.  Ambulation/Gait             General Gait Details: unable   Stairs             Wheelchair Mobility    Modified Rankin (Stroke Patients Only)        Balance Overall balance assessment: Needs assistance Sitting-balance support: Feet supported Sitting balance-Leahy Scale: Fair Sitting balance - Comments: able to sit unsupported but unsafe to be left unattended in unsupported sitting   Standing balance support: Bilateral upper extremity supported;During functional activity Standing balance-Leahy Scale: Poor Standing balance comment: requires external assist to remain upright.                            Cognition Arousal/Alertness: Awake/alert Behavior During Therapy: WFL for tasks assessed/performed Overall Cognitive Status: Within Functional Limits for tasks assessed                                        Exercises Other Exercises Other Exercises: BLE PROM x 10 - ankle pump, heel slides, ab/add and slr Other Exercises: standing x 2 for up to 30 seconds Other Exercises: education with family regarding mobility and exercises.    General Comments        Pertinent Vitals/Pain Pain Assessment: Faces Faces Pain Scale: Hurts little more Pain Location: with mobility Pain Descriptors / Indicators: Guarding;Grimacing    Home Living                      Prior Function  PT Goals (current goals can now be found in the care plan section) Progress towards PT goals: Progressing toward goals    Frequency    BID      PT Plan Current plan remains appropriate    Co-evaluation              AM-PAC PT "6 Clicks" Mobility   Outcome Measure  Help needed turning from your back to your side while in a flat bed without using bedrails?: A Lot Help needed moving from lying on your back to sitting on the side of a flat bed without using bedrails?: A Lot Help needed moving to and from a bed to a chair (including a wheelchair)?: A Lot Help needed standing up from a chair using your arms (e.g., wheelchair or bedside chair)?: A Lot Help needed to walk in hospital room?: Total Help  needed climbing 3-5 steps with a railing? : Total 6 Click Score: 10    End of Session Equipment Utilized During Treatment: Gait belt Activity Tolerance: Patient tolerated treatment well Patient left: in chair;with call bell/phone within reach;with chair alarm set;with family/visitor present Nurse Communication: Other (comment)       Time: 0388-8280 PT Time Calculation (min) (ACUTE ONLY): 21 min  Charges:  $Therapeutic Activity: 8-22 mins                     Chesley Noon, PTA 12/02/18, 12:47 PM

## 2018-12-02 NOTE — Discharge Summary (Signed)
[facility] [place] NAME: Yolanda Turner    MR#:  [id number]  DATE OF BIRTH:  10/28/1925  DATE OF ADMISSION:  11/28/2018 ADMITTING PHYSICIAN: Harrie Foreman, MD  DATE OF DISCHARGE: No discharge date for patient encounter.  PRIMARY CARE PHYSICIAN: Albina Billet, MD    ADMISSION DIAGNOSIS:  Fall [W19.XXXA] Closed fracture of left hip, initial encounter ([place]) [S72.002A]  DISCHARGE DIAGNOSIS:  Active Problems:   Hip fracture ([name])   SECONDARY DIAGNOSIS:   Past Medical History:  Diagnosis Date  . Carotid artery stenosis   . Carotid artery stenosis   . Collagen vascular disease ([place])   . Coronary artery disease   . Diabetes mellitus without complication (HCC)    diet control  . GERD (gastroesophageal reflux disease)   . Hypertension   . Myocardial infarction ([place])   . Osteoporosis   . Peripheral vascular disease ([place])   . Pneumonia   . Renal artery stenosis ([place])   . Status post double vessel coronary artery bypass 2005  . Stroke ([place])   . Uterine cancer [facility])     HOSPITAL COURSE:   This is a 85 year old female admitted for hip fracture.  * Acute Left femoral neck fracture Stable Status post repair by orthopedic surgery, physical therapy did see patient while in house-however the patient/patient's family want the patient home with home health services status post discharge  * Hypertension Controlled on current regiment    * CAD aspirin and Plavix  * Diabetes mellitus type 2 Stable on current regimen   DISCHARGE CONDITIONS:   stable  CONSULTS OBTAINED:  Treatment Team:  Corky Mull, MD  DRUG ALLERGIES:   Allergies  Allergen Reactions  . Penicillins Rash    Has patient had a PCN reaction causing immediate rash, facial/tongue/throat swelling, SOB or lightheadedness with hypotension: No Has patient had a PCN reaction causing severe rash involving mucus membranes or skin necrosis: No Has  patient had a PCN reaction that required hospitalization: No Has patient had a PCN reaction occurring within the last 10 years: No If all of the above answers are "NO", then may proceed with Cephalosporin use.    DISCHARGE MEDICATIONS:   Allergies as of 12/02/2018      Reactions   Penicillins Rash   Has patient had a PCN reaction causing immediate rash, facial/tongue/throat swelling, SOB or lightheadedness with hypotension: No Has patient had a PCN reaction causing severe rash involving mucus membranes or skin necrosis: No Has patient had a PCN reaction that required hospitalization: No Has patient had a PCN reaction occurring within the last 10 years: No If all of the above answers are "NO", then may proceed with Cephalosporin use.      Medication List    TAKE these medications   enoxaparin 40 MG/0.4ML injection Commonly known as: LOVENOX Inject 0.4 mLs (40 mg total) into the skin daily for 14 days. Notes to patient: Tomorrow 12/02/18   gabapentin 300 MG capsule Commonly known as: NEURONTIN Take 600 mg by mouth at bedtime. Notes to patient: Tonight 12/01/18   lisinopril 5 MG tablet Commonly known as: ZESTRIL Take 5 mg by mouth daily. Notes to patient: Tomorrow 12/02/18   metoprolol succinate 25 MG 24 hr tablet Commonly known as: TOPROL-XL Take 0.5 tablets (12.5 mg total) by mouth daily. Notes to patient: Tomorrow 12/02/18   ondansetron 4 MG tablet Commonly known as: ZOFRAN Take 1 tablet (4 mg total) by mouth every 6 (  six) hours as needed for nausea.   oxyCODONE 5 MG immediate release tablet Commonly known as: Oxy IR/ROXICODONE Take 1-2 tablets (5-10 mg total) by mouth every 4 (four) hours as needed for moderate pain (pain score 4-6). Notes to patient: Anytime   simvastatin 40 MG tablet Commonly known as: ZOCOR Take 40 mg by mouth daily. Notes to patient: Tomorrow 12/02/18   traMADol 50 MG tablet Commonly known as: ULTRAM Take 1 tablet (50 mg total) by mouth every 6  (six) hours as needed for moderate pain. Notes to patient: anytime            Durable Medical Equipment  (From admission, onward)         Start     Ordered   12/01/18 1155  For home use only DME lightweight manual wheelchair with seat cushion  Once    Comments: Regular size wheelchair with seat and back cushions  Patient suffers from hip fracture which impairs their ability to perform daily activities like bathing in the home.  A walker will not resolve  issue with performing activities of daily living. A wheelchair will allow patient to safely perform daily activities. Patient is not able to propel themselves in the home using a standard weight wheelchair due to arm weakness. Patient can self propel in the lightweight wheelchair. Length of need Lifetime. Accessories: elevating leg rests (ELRs), wheel locks, extensions and anti-tippers.   12/01/18 1154   12/01/18 1140  For home use only DME Bedside commode  Once    Question:  Patient needs a bedside commode to treat with the following condition  Answer:  Hip fx ([place])   12/01/18 1140           DISCHARGE INSTRUCTIONS:      If you experience worsening of your admission symptoms, develop shortness of breath, life threatening emergency, suicidal or homicidal thoughts you must seek medical attention immediately by calling 911 or calling your MD immediately  if symptoms less severe.  You Must read complete instructions/literature along with all the possible adverse reactions/side effects for all the Medicines you take and that have been prescribed to you. Take any new Medicines after you have completely understood and accept all the possible adverse reactions/side effects.   Please note  You were cared for by a hospitalist during your hospital stay. If you have any questions about your discharge medications or the care you received while you were in the hospital after you are discharged, you can call the unit and asked to speak with  the hospitalist on call if the hospitalist that took care of you is not available. Once you are discharged, your primary care physician will handle any further medical issues. Please note that NO REFILLS for any discharge medications will be authorized once you are discharged, as it is imperative that you return to your primary care physician (or establish a relationship with a primary care physician if you do not have one) for your aftercare needs so that they can reassess your need for medications and monitor your lab values.    Today   CHIEF COMPLAINT:   Chief Complaint  Patient presents with  . Hip Pain    HISTORY OF PRESENT ILLNESS:   The patient with past medical history of CAD, diabetes, hypertension and history of uterine cancer presents to the emergency department after a mechanical fall.  The patient tripped and landed on her left hip.  She immediately felt pain and was unable to walk.  X-ray of the hip in the emergency department revealed severe femoral neck fracture with at least 10 mm of displacement.  Orthopedic surgery was notified before the emergency department staff, hospitalist service for admission.  VITAL SIGNS:  Blood pressure (!) 153/73, pulse 100, temperature 98.3 F (36.8 C), temperature source Oral, resp. rate 17, height 5\' 1"  (1.549 m), weight 37.3 kg, SpO2 96 %.  I/O:    Intake/Output Summary (Last 24 hours) at 12/02/2018 1028 Last data filed at 12/02/2018 1018 Gross per 24 hour  Intake 360 ml  Output 701 ml  Net -341 ml    PHYSICAL EXAMINATION:  GENERAL:  85 y.o.-year-old patient lying in the bed with no acute distress.  EYES: Pupils equal, round, reactive to light and accommodation. No scleral icterus. Extraocular muscles intact.  HEENT: Head atraumatic, normocephalic. Oropharynx and nasopharynx clear.  NECK:  Supple, no jugular venous distention. No thyroid enlargement, no tenderness.  LUNGS: Normal breath sounds bilaterally, no wheezing,  rales,rhonchi or crepitation. No use of accessory muscles of respiration.  CARDIOVASCULAR: S1, S2 normal. No murmurs, rubs, or gallops.  ABDOMEN: Soft, non-tender, non-distended. Bowel sounds present. No organomegaly or mass.  EXTREMITIES: No pedal edema, cyanosis, or clubbing.  NEUROLOGIC: Cranial nerves II through XII are intact. Muscle strength 5/5 in all extremities. Sensation intact. Gait not checked.  PSYCHIATRIC: The patient is alert and oriented x 3.  SKIN: No obvious rash, lesion, or ulcer.   DATA REVIEW:   CBC Recent Labs  Lab 11/30/18 0606  WBC 10.1  HGB 9.3*  HCT 29.7*  PLT 164    Chemistries  Recent Labs  Lab 11/29/18 0746  NA 143  K 4.7  CL 109  CO2 21*  GLUCOSE 146*  BUN 30*  CREATININE 0.84  CALCIUM 9.3    Cardiac Enzymes No results for input(s): TROPONINI in the last 168 hours.  Microbiology Results  Results for orders placed or performed during the hospital encounter of 11/28/18  SARS Coronavirus 2 (CEPHEID - Performed in [facility] hospital lab), Hosp Order     Status: None   Collection Time: 11/28/18  4:57 AM   Specimen: Nasopharyngeal Swab  Result Value Ref Range Status   SARS Coronavirus 2 NEGATIVE NEGATIVE Final    Comment: (NOTE) If result is NEGATIVE SARS-CoV-2 target nucleic acids are NOT DETECTED. The SARS-CoV-2 RNA is generally detectable in upper and lower  respiratory specimens during the acute phase of infection. The lowest  concentration of SARS-CoV-2 viral copies this assay can detect is 250  copies / mL. A negative result does not preclude SARS-CoV-2 infection  and should not be used as the sole basis for treatment or other  patient management decisions.  A negative result may occur with  improper specimen collection / handling, submission of specimen other  than nasopharyngeal swab, presence of viral mutation(s) within the  areas targeted by this assay, and inadequate number of viral copies  (<250 copies / mL). A negative  result must be combined with clinical  observations, patient history, and epidemiological information. If result is POSITIVE SARS-CoV-2 target nucleic acids are DETECTED. The SARS-CoV-2 RNA is generally detectable in upper and lower  respiratory specimens dur ing the acute phase of infection.  Positive  results are indicative of active infection with SARS-CoV-2.  Clinical  correlation with patient history and other diagnostic information is  necessary to determine patient infection status.  Positive results do  not rule out bacterial infection or co-infection with other viruses. If result is  PRESUMPTIVE POSTIVE SARS-CoV-2 nucleic acids MAY BE PRESENT.   A presumptive positive result was obtained on the submitted specimen  and confirmed on repeat testing.  While 2019 novel coronavirus  (SARS-CoV-2) nucleic acids may be present in the submitted sample  additional confirmatory testing may be necessary for epidemiological  and / or clinical management purposes  to differentiate between  SARS-CoV-2 and other Sarbecovirus currently known to infect humans.  If clinically indicated additional testing with an alternate test  methodology [phone]) is advised. The SARS-CoV-2 RNA is generally  detectable in upper and lower respiratory sp ecimens during the acute  phase of infection. The expected result is Negative. Fact Sheet for Patients:  StrictlyIdeas.no Fact Sheet for Healthcare Providers: BankingDealers.co.za This test is not yet approved or cleared by the [place] FDA and has been authorized for detection and/or diagnosis of SARS-CoV-2 by FDA under an Emergency Use Authorization (EUA).  This EUA will remain in effect (meaning this test can be used) for the duration of the COVID-19 declaration under Section 564(b)(1) of the Act, 21 U.S.C. section 360bbb-3(b)(1), unless the authorization is terminated or revoked sooner. Performed at  [facility], [address], [place], [place] [address]   Surgical PCR screen     Status: None   Collection Time: 11/28/18  9:40 AM   Specimen: Nasal Mucosa; Nasal Swab  Result Value Ref Range Status   MRSA, PCR NEGATIVE NEGATIVE Final   Staphylococcus aureus NEGATIVE NEGATIVE Final    Comment: (NOTE) The Xpert SA Assay (FDA approved for NASAL specimens in patients 65 years of age and older), is one component of a comprehensive surveillance program. It is not intended to diagnose infection nor to guide or monitor treatment. Performed at [facility], [address], [place], [place] [address]     RADIOLOGY:  No results found.  EKG:   Orders placed or performed during the hospital encounter of 11/28/18  . ED EKG  . ED EKG      Management plans discussed with the patient, family and they are in agreement.  CODE STATUS:     Code Status Orders  (From admission, onward)         Start     Ordered   11/28/18 0811  Do not attempt resuscitation (DNR)  Continuous    Question Answer Comment  In the event of cardiac or respiratory ARREST Do not call a "code blue"   In the event of cardiac or respiratory ARREST Do not perform Intubation, CPR, defibrillation or ACLS   In the event of cardiac or respiratory ARREST Use medication by any route, position, wound care, and other measures to relive pain and suffering. May use oxygen, suction and manual treatment of airway obstruction as needed for comfort.      11/28/18 0810        Code Status History    Date Active Date Inactive Code Status Order ID Comments User Context   10/18/2018 1224 10/19/2018 1810 DNR [id number]  Henreitta Leber, MD Inpatient   10/16/2018 2345 10/18/2018 1224 Full Code [id number]  Lance Coon, MD Inpatient   05/07/2018 1349 05/09/2018 1612 Full Code [id number]  Bettey Costa, MD Inpatient   04/04/2017 1431 04/04/2017 2137 DNR [id number]  Asencion Gowda, NP Inpatient   04/02/2017 1837  04/04/2017 1430 Full Code [id number]  Vaughan Basta, MD Inpatient   01/22/2015 1036 01/22/2015 1522 Full Code [id number]  Schnier, Dolores Lory, MD Inpatient   Advance Care Planning Activity  TOTAL TIME TAKING CARE OF THIS PATIENT: 40 minutes.    Avel Peace  M.D on 12/02/2018 at 10:28 AM  Between 7am to 6pm - Pager - [phone]  After 6pm go to [email or web address] - password [email or web address] [facility]  Office  [phone]  CC: Primary care physician; Albina Billet, MD   Note: This dictation was prepared with Dragon dictation along with smaller phrase technology. Any transcriptional errors that result from this process are unintentional.

## 2018-12-02 NOTE — Progress Notes (Signed)
Subjective: 3 Days Post-Op Procedure(s) (LRB): ARTHROPLASTY BIPOLAR HIP (HEMIARTHROPLASTY) (Left) Patient reports pain as resting comfortably when I enter the room.  Awake this morning. Pt and care management to assist with discharge planning. Fever: no Gastrointestinal:Negative for nausea and vomiting  Objective: Vital signs in last 24 hours: Temp:  [97.5 F (36.4 C)-98.5 F (36.9 C)] 98 F (36.7 C) (06/21 0036) Pulse Rate:  [84-89] 84 (06/21 0036) Resp:  [17] 17 (06/21 0036) BP: (144-150)/(65-73) 146/65 (06/21 0036) SpO2:  [95 %-97 %] 95 % (06/21 0036) Weight:  [37.3 kg] 37.3 kg (06/21 0109)  Intake/Output from previous day:  Intake/Output Summary (Last 24 hours) at 12/02/2018 0715 Last data filed at 12/02/2018 0500 Gross per 24 hour  Intake 120 ml  Output 701 ml  Net -581 ml    Intake/Output this shift: No intake/output data recorded.  Labs: Recent Labs    11/30/18 0606  HGB 9.3*   Recent Labs    11/30/18 0606  WBC 10.1  RBC 3.22*  HCT 29.7*  PLT 164   Recent Labs    11/29/18 0746  NA 143  K 4.7  CL 109  CO2 21*  BUN 30*  CREATININE 0.84  GLUCOSE 146*  CALCIUM 9.3   No results for input(s): LABPT, INR in the last 72 hours.   EXAM General - Patient is sound asleep this morning. Extremity - Incision: dressing C/D/I No cellulitis present Compartment soft Dressing/Incision -no drainage this morning. Motor Function - intact, moving foot and toes well on exam.  Past Medical History:  Diagnosis Date  . Carotid artery stenosis   . Carotid artery stenosis   . Collagen vascular disease ([place])   . Coronary artery disease   . Diabetes mellitus without complication (HCC)    diet control  . GERD (gastroesophageal reflux disease)   . Hypertension   . Myocardial infarction ([place])   . Osteoporosis   . Peripheral vascular disease ([place])   . Pneumonia   . Renal artery stenosis ([place])   . Status post double vessel coronary artery bypass 2005  . Stroke  ([place])   . Uterine cancer (HCC)     Assessment/Plan: 3 Days Post-Op Procedure(s) (LRB): ARTHROPLASTY BIPOLAR HIP (HEMIARTHROPLASTY) (Left) Active Problems:   Hip fracture (HCC)  Estimated body mass index is 15.53 kg/m as calculated from the following:   Height as of this encounter: 5\' 1"  (1.549 m).   Weight as of this encounter: 37.3 kg. Advance diet Up with therapy D/C IV fluids when tolerating po intake.  Up with therapy today.  PT and Care management to assist with discharge planning. Begin working on bowel movement. Patient will likely need home with family upon discharge.  DVT Prophylaxis - Lovenox, Foot Pumps and TED hose Weight-Bearing as tolerated to left leg  Reche Dixon, PA-C [facility] Orthopaedic Surgery 12/02/2018, 7:15 AM

## 2018-12-02 NOTE — TOC Transition Note (Signed)
Transition of Care [facility]) - CM/SW Discharge Note   Patient Details  Name: Yolanda Turner MRN: [id number] Date of Birth: 24-Sep-1925  Transition of [facility] CM/SW Contact:  Ross Ludwig, LCSW Phone Number: 12/02/2018, 10:37 AM   Clinical Narrative:     Patient will be discharging back home with home PT, RN, Aide, social worker, and OT.  CSW contacted [employer], who will deliver the bedside commode and wheelchair. Tommi Rumps with [facility] home [facility] was notified about patient discharging today.  Final next level of care: [place] Barriers to Discharge: Barriers Resolved   Patient Goals and CMS Choice Patient states their goals for this hospitalization and ongoing recovery are:: To return back home with home health. CMS Medicare.gov Compare Post Acute Care list provided to:: Patient Represenative (must comment) Choice offered to / list presented to : Adult Children  Discharge Placement    Discharging back home with home health.                  Discharge Plan and Services In-house Referral: Clinical Social Work Discharge Planning Services: CM Consult Post Acute Care Choice: Durable Medical Equipment          DME Arranged: Youth worker wheelchair with seat cushion, Bedside commode DME Agency: [employer] Date DME Agency Contacted: 12/02/18 Time DME Agency Contacted: [phone] Representative spoke with at DME Agency: [place]: RN, PT, OT, Nurse's Aide, Social Work [employer] Agency: [place] Date [place]: 12/02/18 Time [place]: 1036 Representative spoke with at [place]: [place] ([place]) Interventions     Readmission Risk Interventions No flowsheet data found.

## 2018-12-02 NOTE — Progress Notes (Signed)
Patient discharging home via ems. Instructions given to patients granddaughter christy, verbalized understanding. Prescriptions given to patient's daughter.

## 2018-12-03 LAB — SURGICAL PATHOLOGY

## 2018-12-04 ENCOUNTER — Emergency Department: Payer: Medicare Other

## 2018-12-04 ENCOUNTER — Other Ambulatory Visit: Payer: Self-pay

## 2018-12-04 ENCOUNTER — Inpatient Hospital Stay
Admission: EM | Admit: 2018-12-04 | Discharge: 2018-12-06 | DRG: 193 | Disposition: A | Discharge status: Home Health | Payer: Medicare Other | Attending: Internal Medicine | Admitting: Internal Medicine

## 2018-12-04 DIAGNOSIS — Z951 Presence of aortocoronary bypass graft: Secondary | ICD-10-CM | POA: Diagnosis not present

## 2018-12-04 DIAGNOSIS — I69320 Aphasia following cerebral infarction: Secondary | ICD-10-CM | POA: Diagnosis not present

## 2018-12-04 DIAGNOSIS — Z801 Family history of malignant neoplasm of trachea, bronchus and lung: Secondary | ICD-10-CM

## 2018-12-04 DIAGNOSIS — Z96642 Presence of left artificial hip joint: Secondary | ICD-10-CM | POA: Diagnosis present

## 2018-12-04 DIAGNOSIS — Z880 Allergy status to penicillin: Secondary | ICD-10-CM | POA: Diagnosis not present

## 2018-12-04 DIAGNOSIS — I10 Essential (primary) hypertension: Secondary | ICD-10-CM | POA: Diagnosis present

## 2018-12-04 DIAGNOSIS — Z87891 Personal history of nicotine dependence: Secondary | ICD-10-CM | POA: Diagnosis not present

## 2018-12-04 DIAGNOSIS — I252 Old myocardial infarction: Secondary | ICD-10-CM | POA: Diagnosis not present

## 2018-12-04 DIAGNOSIS — E785 Hyperlipidemia, unspecified: Secondary | ICD-10-CM | POA: Diagnosis present

## 2018-12-04 DIAGNOSIS — M810 Age-related osteoporosis without current pathological fracture: Secondary | ICD-10-CM | POA: Diagnosis present

## 2018-12-04 DIAGNOSIS — Z23 Encounter for immunization: Secondary | ICD-10-CM

## 2018-12-04 DIAGNOSIS — I2510 Atherosclerotic heart disease of native coronary artery without angina pectoris: Secondary | ICD-10-CM | POA: Diagnosis present

## 2018-12-04 DIAGNOSIS — I4891 Unspecified atrial fibrillation: Secondary | ICD-10-CM | POA: Diagnosis present

## 2018-12-04 DIAGNOSIS — J189 Pneumonia, unspecified organism: Principal | ICD-10-CM | POA: Diagnosis present

## 2018-12-04 DIAGNOSIS — I701 Atherosclerosis of renal artery: Secondary | ICD-10-CM | POA: Diagnosis present

## 2018-12-04 DIAGNOSIS — J9601 Acute respiratory failure with hypoxia: Secondary | ICD-10-CM | POA: Diagnosis present

## 2018-12-04 DIAGNOSIS — J440 Chronic obstructive pulmonary disease with acute lower respiratory infection: Secondary | ICD-10-CM | POA: Diagnosis present

## 2018-12-04 DIAGNOSIS — Z1159 Encounter for screening for other viral diseases: Secondary | ICD-10-CM | POA: Diagnosis not present

## 2018-12-04 DIAGNOSIS — Z8542 Personal history of malignant neoplasm of other parts of uterus: Secondary | ICD-10-CM | POA: Diagnosis not present

## 2018-12-04 DIAGNOSIS — Z20828 Contact with and (suspected) exposure to other viral communicable diseases: Secondary | ICD-10-CM | POA: Diagnosis present

## 2018-12-04 DIAGNOSIS — E1151 Type 2 diabetes mellitus with diabetic peripheral angiopathy without gangrene: Secondary | ICD-10-CM | POA: Diagnosis present

## 2018-12-04 DIAGNOSIS — J9600 Acute respiratory failure, unspecified whether with hypoxia or hypercapnia: Secondary | ICD-10-CM | POA: Diagnosis present

## 2018-12-04 DIAGNOSIS — R0602 Shortness of breath: Secondary | ICD-10-CM | POA: Diagnosis present

## 2018-12-04 DIAGNOSIS — I248 Other forms of acute ischemic heart disease: Secondary | ICD-10-CM | POA: Diagnosis present

## 2018-12-04 DIAGNOSIS — Z66 Do not resuscitate: Secondary | ICD-10-CM | POA: Diagnosis present

## 2018-12-04 DIAGNOSIS — K219 Gastro-esophageal reflux disease without esophagitis: Secondary | ICD-10-CM | POA: Diagnosis present

## 2018-12-04 LAB — CBC WITH DIFFERENTIAL/PLATELET
Abs Immature Granulocytes: 0.02 10*3/uL (ref 0.00–0.07)
Basophils Absolute: 0 10*3/uL (ref 0.0–0.1)
Basophils Relative: 0 %
Eosinophils Absolute: 0.1 10*3/uL (ref 0.0–0.5)
Eosinophils Relative: 1 %
HCT: 33.4 % — ABNORMAL LOW (ref 36.0–46.0)
Hemoglobin: 10.6 g/dL — ABNORMAL LOW (ref 12.0–15.0)
Immature Granulocytes: 0 %
Lymphocytes Relative: 8 %
Lymphs Abs: 0.5 10*3/uL — ABNORMAL LOW (ref 0.7–4.0)
MCH: 28.6 pg (ref 26.0–34.0)
MCHC: 31.7 g/dL (ref 30.0–36.0)
MCV: 90 fL (ref 80.0–100.0)
Monocytes Absolute: 0.6 10*3/uL (ref 0.1–1.0)
Monocytes Relative: 8 %
Neutro Abs: 5.7 10*3/uL (ref 1.7–7.7)
Neutrophils Relative %: 83 %
Platelets: 248 10*3/uL (ref 150–400)
RBC: 3.71 MIL/uL — ABNORMAL LOW (ref 3.87–5.11)
RDW: 13.2 % (ref 11.5–15.5)
WBC: 6.9 10*3/uL (ref 4.0–10.5)
nRBC: 0 % (ref 0.0–0.2)

## 2018-12-04 LAB — BLOOD GAS, VENOUS
Acid-Base Excess: 8.5 mmol/L — ABNORMAL HIGH (ref 0.0–2.0)
Bicarbonate: 35.3 mmol/L — ABNORMAL HIGH (ref 20.0–28.0)
O2 Saturation: 86.4 %
Patient temperature: 37
pCO2, Ven: 57 mmHg (ref 44.0–60.0)
pH, Ven: 7.4 (ref 7.250–7.430)
pO2, Ven: 52 mmHg — ABNORMAL HIGH (ref 32.0–45.0)

## 2018-12-04 LAB — COMPREHENSIVE METABOLIC PANEL
ALT: 8 U/L (ref 0–44)
AST: 15 U/L (ref 15–41)
Albumin: 2.6 g/dL — ABNORMAL LOW (ref 3.5–5.0)
Alkaline Phosphatase: 46 U/L (ref 38–126)
Anion gap: 6 (ref 5–15)
BUN: 13 mg/dL (ref 8–23)
CO2: 30 mmol/L (ref 22–32)
Calcium: 8.8 mg/dL — ABNORMAL LOW (ref 8.9–10.3)
Chloride: 99 mmol/L (ref 98–111)
Creatinine, Ser: 0.56 mg/dL (ref 0.44–1.00)
GFR calc Af Amer: >60 mL/min (ref >60)
GFR calc non Af Amer: >60 mL/min (ref >60)
Glucose, Bld: 146 mg/dL — ABNORMAL HIGH (ref 70–99)
Potassium: 4.6 mmol/L (ref 3.5–5.1)
Sodium: 135 mmol/L (ref 135–145)
Total Bilirubin: 0.5 mg/dL (ref 0.3–1.2)
Total Protein: 5.2 g/dL — ABNORMAL LOW (ref 6.5–8.1)

## 2018-12-04 LAB — FIBRIN DERIVATIVES D-DIMER (ARMC ONLY): Fibrin derivatives D-dimer (ARMC): 2457.6 ng/mL (FEU) — ABNORMAL HIGH (ref 0.00–499.00)

## 2018-12-04 LAB — CBC
HCT: 34.9 % — ABNORMAL LOW (ref 36.0–46.0)
Hemoglobin: 11.3 g/dL — ABNORMAL LOW (ref 12.0–15.0)
MCH: 28.8 pg (ref 26.0–34.0)
MCHC: 32.4 g/dL (ref 30.0–36.0)
MCV: 88.8 fL (ref 80.0–100.0)
Platelets: 219 10*3/uL (ref 150–400)
RBC: 3.93 MIL/uL (ref 3.87–5.11)
RDW: 13 % (ref 11.5–15.5)
WBC: 7.4 10*3/uL (ref 4.0–10.5)
nRBC: 0 % (ref 0.0–0.2)

## 2018-12-04 LAB — TROPONIN I (HIGH SENSITIVITY)
Troponin I (High Sensitivity): 25 ng/L — ABNORMAL HIGH (ref <18)
Troponin I (High Sensitivity): 23 ng/L — ABNORMAL HIGH (ref <18)

## 2018-12-04 LAB — BRAIN NATRIURETIC PEPTIDE: B Natriuretic Peptide: 642 pg/mL — ABNORMAL HIGH (ref 0.0–100.0)

## 2018-12-04 LAB — PROCALCITONIN: Procalcitonin: 1.49 ng/mL

## 2018-12-04 LAB — SARS CORONAVIRUS 2 BY RT PCR (HOSPITAL ORDER, PERFORMED IN ~~LOC~~ HOSPITAL LAB): SARS Coronavirus 2: NEGATIVE

## 2018-12-04 MED ORDER — LISINOPRIL 5 MG PO TABS
5.0000 mg | ORAL_TABLET | Freq: Every day | ORAL | Status: DC
  Administered 2018-12-04 – 2018-12-06 (×3): 5 mg via ORAL
  Filled 2018-12-04 (×3): qty 1

## 2018-12-04 MED ORDER — SODIUM CHLORIDE 0.9% FLUSH: 3.0000 mL | INTRAVENOUS | Status: DC | PRN

## 2018-12-04 MED ORDER — GABAPENTIN 300 MG PO CAPS
600.0000 mg | ORAL_CAPSULE | Freq: Every day | ORAL | Status: DC
  Administered 2018-12-04 – 2018-12-05 (×2): 600 mg via ORAL
  Filled 2018-12-04 (×2): qty 2

## 2018-12-04 MED ORDER — TRAMADOL HCL 50 MG PO TABS
25.0000 mg | ORAL_TABLET | Freq: Four times a day (QID) | ORAL | Status: DC | PRN
  Administered 2018-12-04: 25 mg via ORAL
  Filled 2018-12-04 (×2): qty 1

## 2018-12-04 MED ORDER — SODIUM CHLORIDE 0.9 % IV SOLN
500.0000 mg | INTRAVENOUS | Status: DC
  Administered 2018-12-05: 17:00:00 500 mg via INTRAVENOUS
  Filled 2018-12-04 (×2): qty 500

## 2018-12-04 MED ORDER — VANCOMYCIN HCL IN DEXTROSE 1-5 GM/200ML-% IV SOLN
1000.0000 mg | Freq: Once | INTRAVENOUS | Status: AC
  Administered 2018-12-04: 17:00:00 1000 mg via INTRAVENOUS
  Filled 2018-12-04: qty 200

## 2018-12-04 MED ORDER — SIMVASTATIN 20 MG PO TABS
40.0000 mg | ORAL_TABLET | Freq: Every day | ORAL | Status: DC
  Administered 2018-12-04 – 2018-12-06 (×3): 40 mg via ORAL
  Filled 2018-12-04: qty 4
  Filled 2018-12-04 (×2): qty 2

## 2018-12-04 MED ORDER — SODIUM CHLORIDE 0.9% FLUSH
3.0000 mL | Freq: Two times a day (BID) | INTRAVENOUS | Status: DC
  Administered 2018-12-05 – 2018-12-06 (×3): 3 mL via INTRAVENOUS

## 2018-12-04 MED ORDER — IPRATROPIUM-ALBUTEROL 0.5-2.5 (3) MG/3ML IN SOLN: 3.0000 mL | Freq: Four times a day (QID) | RESPIRATORY_TRACT | Status: DC | PRN

## 2018-12-04 MED ORDER — ACETAMINOPHEN 325 MG PO TABS: 650.0000 mg | ORAL_TABLET | Freq: Four times a day (QID) | ORAL | Status: DC | PRN

## 2018-12-04 MED ORDER — ONDANSETRON HCL 4 MG PO TABS: 4.0000 mg | ORAL_TABLET | Freq: Four times a day (QID) | ORAL | Status: DC | PRN

## 2018-12-04 MED ORDER — SODIUM CHLORIDE 0.9 % IV SOLN
250.0000 mL | INTRAVENOUS | Status: DC | PRN
  Administered 2018-12-05 – 2018-12-06 (×2): 250 mL via INTRAVENOUS

## 2018-12-04 MED ORDER — ENOXAPARIN SODIUM 40 MG/0.4ML ~~LOC~~ SOLN: 40.0000 mg | SUBCUTANEOUS | Status: DC

## 2018-12-04 MED ORDER — LEVOFLOXACIN IN D5W 750 MG/150ML IV SOLN
750.0000 mg | Freq: Once | INTRAVENOUS | Status: AC
  Administered 2018-12-04: 750 mg via INTRAVENOUS
  Filled 2018-12-04: qty 150

## 2018-12-04 MED ORDER — POLYETHYLENE GLYCOL 3350 17 G PO PACK: 17.0000 g | PACK | Freq: Every day | ORAL | Status: DC | PRN

## 2018-12-04 MED ORDER — IOHEXOL 350 MG/ML SOLN
60.0000 mL | Freq: Once | INTRAVENOUS | Status: AC | PRN
  Administered 2018-12-04: 15:00:00 60 mL via INTRAVENOUS

## 2018-12-04 MED ORDER — ONDANSETRON HCL 4 MG/2ML IJ SOLN: 4.0000 mg | Freq: Four times a day (QID) | INTRAMUSCULAR | Status: DC | PRN

## 2018-12-04 MED ORDER — ACETAMINOPHEN 650 MG RE SUPP: 650.0000 mg | Freq: Four times a day (QID) | RECTAL | Status: DC | PRN

## 2018-12-04 MED ORDER — METOPROLOL SUCCINATE ER 25 MG PO TB24
12.5000 mg | ORAL_TABLET | Freq: Every day | ORAL | Status: DC
  Administered 2018-12-04 – 2018-12-05 (×2): 12.5 mg via ORAL
  Filled 2018-12-04: qty 0.5
  Filled 2018-12-04: qty 1

## 2018-12-04 MED ORDER — SODIUM CHLORIDE 0.9 % IV SOLN
1.0000 g | Freq: Two times a day (BID) | INTRAVENOUS | Status: DC
  Administered 2018-12-05 – 2018-12-06 (×3): 1 g via INTRAVENOUS
  Filled 2018-12-04 (×4): qty 1

## 2018-12-04 MED ORDER — ONDANSETRON HCL 4 MG PO TABS
4.0000 mg | ORAL_TABLET | Freq: Four times a day (QID) | ORAL | Status: DC | PRN
  Administered 2018-12-04: 4 mg via ORAL
  Filled 2018-12-04: qty 1

## 2018-12-04 NOTE — Consult Note (Signed)
Pharmacy Antibiotic Note  Yolanda Turner is a 85 y.o. female admitted on 12/04/2018 with CAP.  Pharmacy has been consulted for antibiotic dosing of Levofloxacin and Vancomycin -pending contengencies. Per consult note "Pharmacist to investigate beta-lactam allergy. If history of intolerance, mild allergy, or documented history of use of cephalosporins, pharmacy can adjust levofloxacin to cefepime and azithromycin."  Allergy PCN: Cefazolin given on 11/29/2018 and no recorded issues documented. I attempted to ask the patient about her h/o PCN allergy, but she wasn't able to communicate well.   Given patient had cefazolin, will start the patient on cefepime and azithromycin.   Plan: 1) Will start Cefepime 2 g Q12H at 0500 on 6/24  2) Will start Azithromycin 500 mg Q24H @ 1600 on 6/24  Height: 5\' 1"  (154.9 cm) Weight: 90 lb (40.8 kg) IBW/kg (Calculated) : 47.8  Temp (24hrs), Avg:98.6 F (37 C), Min:98.6 F (37 C), Max:98.6 F (37 C)  Recent Labs  Lab 11/28/18 0436 11/29/18 0746 11/30/18 0606 12/04/18 1357  WBC 8.6  --  10.1 6.9  CREATININE 0.93 0.84  --  0.56    Estimated Creatinine Clearance: 30.1 mL/min (by C-G formula based on SCr of 0.56 mg/dL).    Allergies  Allergen Reactions  . Penicillins Rash    Has patient had a PCN reaction causing immediate rash, facial/tongue/throat swelling, SOB or lightheadedness with hypotension: No Has patient had a PCN reaction causing severe rash involving mucus membranes or skin necrosis: No Has patient had a PCN reaction that required hospitalization: No Has patient had a PCN reaction occurring within the last 10 years: No If all of the above answers are "NO", then may proceed with Cephalosporin use.    Antimicrobials this admission: 6/23 Vanc x1 dose  6/23 Levofloxacin x1 dose  6/24 Azithromycin >> 6/24 Cefepime >>   Dose adjustments this admission: 6/23 Vanc x1 dose  6/23 Levofloxacin x1 dose   Microbiology results: 6/23 BCx:  pending   Thank you for allowing pharmacy to be a part of this patient's care.  Rowland Lathe 12/04/2018 5:56 PM

## 2018-12-04 NOTE — ED Provider Notes (Signed)
[facility] Emergency Department Provider Note  ____________________________________________   First MD Initiated Contact with Patient 12/04/18 1356     (approximate)  I have reviewed the triage vital signs and the nursing notes.   HISTORY  Chief Complaint Shortness of Breath    HPI Yolanda Turner is a 85 y.o. female with past medical history as below here with shortness of breath.  History somewhat limited due to increased work of breathing.  However, patient recently had a left hip replacement after fracture.  She reportedly had been healing well, but over the last day has been increasingly confused.  She has been coughing more than usual.  Her nursing facility called EMS, and she was found to be hypoxic in the 70s.  She was less responsive.  She was given supplemental oxygen via nonrebreather and has since had improvement in her mental status.  On my assessment, she states her hip pain seems to be improving.  She does endorse some moderate shortness of breath but denies any overt chest pain.  She denies known history of DVT.  No other acute complaints.  Remainder of history limited due to increased work of breathing, age.        Past Medical History:  Diagnosis Date  . Carotid artery stenosis   . Carotid artery stenosis   . Collagen vascular disease ([place])   . Coronary artery disease   . Diabetes mellitus without complication (HCC)    diet control  . GERD (gastroesophageal reflux disease)   . Hypertension   . Myocardial infarction ([place])   . Osteoporosis   . Peripheral vascular disease ([place])   . Pneumonia   . Renal artery stenosis ([place])   . Status post double vessel coronary artery bypass 2005  . Stroke ([place])   . Uterine cancer [facility])     Patient Active Problem List   Diagnosis Date Noted  . Hip fracture ([place]) 11/28/2018  . Goals of care, counseling/discussion   . Palliative care by specialist   . GERD (gastroesophageal reflux disease)  10/16/2018  . CAD (coronary artery disease) 10/16/2018  . Atrial fibrillation with RVR ([place]) 10/16/2018  . CVA (cerebral vascular accident) ([place]) 05/07/2018  . Hypertension 02/02/2018  . Diabetes mellitus without complication ([place]) 93/23/5573  . Carotid artery stenosis 02/02/2018  . Acute CVA (cerebrovascular accident) ([place]) 04/04/2017  . Speech problem 04/02/2017  . Confusion 04/02/2017  . TIA (transient ischemic attack) 04/02/2017    Past Surgical History:  Procedure Laterality Date  . ABDOMINAL HYSTERECTOMY    . CAROTID ENDARTERECTOMY    . CATARACT EXTRACTION W/PHACO Right 09/28/2015   Procedure: CATARACT EXTRACTION PHACO AND INTRAOCULAR LENS PLACEMENT (IOC);  Surgeon: Estill Cotta, MD;  Location: [facility] ORS;  Service: Ophthalmology;  Laterality: Right;  [place] 041:18 AP% 28.6 CDE 114.94 fluid pack lot # [id number]  . CORONARY ARTERY BYPASS GRAFT    . HIP ARTHROPLASTY Left 11/29/2018   Procedure: ARTHROPLASTY BIPOLAR HIP (HEMIARTHROPLASTY);  Surgeon: Corky Mull, MD;  Location: [facility] ORS;  Service: Orthopedics;  Laterality: Left;  . PERIPHERAL VASCULAR CATHETERIZATION Right 01/22/2015   Procedure: Carotid PTA/Stent Intervention;  Surgeon: Katha Cabal, MD;  Location: [facility];  Service: Cardiovascular;  Laterality: Right;  . uterine cancer surgery      Prior to Admission medications   Medication Sig Start Date End Date Taking? Authorizing Provider  enoxaparin (LOVENOX) 40 MG/0.4ML injection Inject 0.4 mLs (40 mg total) into the skin daily for 14 days. 12/01/18  12/15/18  Reche Dixon, PA-C  gabapentin (NEURONTIN) 300 MG capsule Take 600 mg by mouth at bedtime.     [provider]  lisinopril (ZESTRIL) 5 MG tablet Take 5 mg by mouth daily.    [provider]  metoprolol succinate (TOPROL-XL) 25 MG 24 hr tablet Take 0.5 tablets (12.5 mg total) by mouth daily. 10/19/18 12/18/18  Henreitta Leber, MD  ondansetron (ZOFRAN) 4 MG tablet Take 1 tablet (4 mg total)  by mouth every 6 (six) hours as needed for nausea. 12/01/18   Salary, Avel Peace, MD  oxyCODONE (OXY IR/ROXICODONE) 5 MG immediate release tablet Take 1-2 tablets (5-10 mg total) by mouth every 4 (four) hours as needed for moderate pain (pain score 4-6). 12/01/18   Reche Dixon, PA-C  simvastatin (ZOCOR) 40 MG tablet Take 40 mg by mouth daily.     [provider]  traMADol (ULTRAM) 50 MG tablet Take 1 tablet (50 mg total) by mouth every 6 (six) hours as needed for moderate pain. 12/01/18   Reche Dixon, PA-C    Allergies Penicillins  Family History  Problem Relation Age of Onset  . Lung cancer Daughter     Social History Social History   Tobacco Use  . Smoking status: Former Research scientist (life sciences)  . Smokeless tobacco: Never Used  Substance Use Topics  . Alcohol use: No  . Drug use: No    Review of Systems  Review of Systems  Constitutional: Positive for fatigue. Negative for fever.  HENT: Negative for congestion and sore throat.   Eyes: Negative for visual disturbance.  Respiratory: Positive for shortness of breath. Negative for cough.   Cardiovascular: Negative for chest pain.  Gastrointestinal: Negative for abdominal pain, diarrhea, nausea and vomiting.  Genitourinary: Negative for flank pain.  Musculoskeletal: Positive for arthralgias and gait problem. Negative for back pain and neck pain.  Skin: Negative for rash and wound.  Neurological: Positive for weakness.     ____________________________________________  PHYSICAL EXAM:      VITAL SIGNS: ED Triage Vitals  Enc Vitals Group     BP 12/04/18 1350 (!) 146/66     Pulse Rate 12/04/18 1350 93     Resp 12/04/18 1350 18     Temp 12/04/18 1350 98.6 F (37 C)     Temp Source 12/04/18 1350 Oral     SpO2 12/04/18 1350 100 %     Weight 12/04/18 1352 90 lb (40.8 kg)     Height 12/04/18 1352 5\' 1"  (1.549 m)     Head Circumference --      Peak Flow --      Pain Score 12/04/18 1351 0     Pain Loc --      Pain Edu? --      Excl.  in [place]? --      Physical Exam Vitals signs and nursing note reviewed.  Constitutional:      General: She is not in acute distress.    Appearance: She is well-developed.     Comments: Elderly, chronically ill-appearing  HENT:     Head: Normocephalic and atraumatic.  Eyes:     Conjunctiva/sclera: Conjunctivae normal.  Neck:     Musculoskeletal: Neck supple.  Cardiovascular:     Rate and Rhythm: Normal rate and regular rhythm.     Heart sounds: Normal heart sounds. No murmur. No friction rub.  Pulmonary:     Effort: Pulmonary effort is normal. No respiratory distress.     Breath sounds: Examination of  the right-lower field reveals rhonchi and rales. Examination of the left-lower field reveals rhonchi and rales. Decreased breath sounds, rhonchi and rales present. No wheezing.  Abdominal:     General: There is no distension.     Palpations: Abdomen is soft.     Tenderness: There is no abdominal tenderness.  Musculoskeletal:     Comments: Status post left hip replacement, surgical site appears clean, dry, intact.  Skin:    General: Skin is warm.     Capillary Refill: Capillary refill takes less than 2 seconds.  Neurological:     Mental Status: She is alert and oriented to person, place, and time.     Motor: No abnormal muscle tone.       ____________________________________________   LABS (all labs ordered are listed, but only abnormal results are displayed)  Labs Reviewed  CBC WITH DIFFERENTIAL/PLATELET - Abnormal; Notable for the following components:      Result Value   RBC 3.71 (*)    Hemoglobin 10.6 (*)    HCT 33.4 (*)    Lymphs Abs 0.5 (*)    All other components within normal limits  COMPREHENSIVE METABOLIC PANEL - Abnormal; Notable for the following components:   Glucose, Bld 146 (*)    Calcium 8.8 (*)    Total Protein 5.2 (*)    Albumin 2.6 (*)    All other components within normal limits  BLOOD GAS, VENOUS - Abnormal; Notable for the following components:    pO2, Ven 52.0 (*)    Bicarbonate 35.3 (*)    Acid-Base Excess 8.5 (*)    All other components within normal limits  TROPONIN I (HIGH SENSITIVITY) - Abnormal; Notable for the following components:   Troponin I (High Sensitivity) 25 (*)    All other components within normal limits  FIBRIN DERIVATIVES D-DIMER ([facility] ONLY) - Abnormal; Notable for the following components:   Fibrin derivatives D-dimer ([facility]) 2,457.60 (*)    All other components within normal limits  SARS CORONAVIRUS 2 (HOSPITAL ORDER, [place] [facility])  CULTURE, BLOOD (ROUTINE X 2)  CULTURE, BLOOD (ROUTINE X 2)  PROCALCITONIN  BRAIN NATRIURETIC PEPTIDE  TROPONIN I (HIGH SENSITIVITY)    ____________________________________________  EKG: Sinus rhythm, left bundle branch block.  No scar B[place] criteria.  PR 150, QRS 139, QTc 442.  No apparent acute ischemic changes. ________________________________________  RADIOLOGY All imaging, including plain films, CT scans, and ultrasounds, independently reviewed by me, and interpretations confirmed via formal radiology reads.  ED MD interpretation:   Chest x-ray: Prior CABG, cardiomegaly, no edema, no focal pneumonia  Official radiology report(s): Dg Chest Portable 1 View  Result Date: 12/04/2018 CLINICAL DATA:  Shortness of breath.  Cough. EXAM: PORTABLE CHEST 1 VIEW COMPARISON:  11/28/2018. FINDINGS: Prior CABG. Cardiomegaly. No pulmonary venous congestion. COPD cannot be excluded. No focal infiltrate. No pleural effusion or pneumothorax. Stable mild left pleural thickening noted consistent scarring. Thoracic spine scoliosis and degenerative change. IMPRESSION: 1.  Prior CABG.  Cardiomegaly.  No pulmonary venous congestion. 2. COPD cannot be excluded. No focal pulmonary infiltrate. Stable mild bilateral pleural thickening noted most likely related to scarring. Electronically Signed   By: Marcello Moores  Register   On: 12/04/2018 14:50     ____________________________________________  PROCEDURES   Procedure(s) performed (including Critical Care):  Procedures  ____________________________________________  INITIAL IMPRESSION / MDM / [place] / ED COURSE  As part of my medical decision making, I reviewed the following data within the [facility]  Notes from prior ED visits and [place] Controlled Substance Database      *Yolanda Turner was evaluated in Emergency Department on 12/04/2018 for the symptoms described in the history of present illness. She was evaluated in the context of the global COVID-19 pandemic, which necessitated consideration that the patient might be at risk for infection with the SARS-CoV-2 virus that causes COVID-19. Institutional protocols and algorithms that pertain to the evaluation of patients at risk for COVID-19 are in a state of rapid change based on information released by regulatory bodies including the CDC and federal and state organizations. These policies and algorithms were followed during the patient's care in the ED.  Some ED evaluations and interventions may be delayed as a result of limited staffing during the pandemic.*      Medical Decision Making: 85-year-old female status post recent hip replacement on 6/18 here with hypoxia and increased work of breathing.  She does have a some mild rhonchi raising concern for possible atelectasis, pneumonia, but in the setting of her profound hypoxia, must also consider PE.  Chest x-ray is without focal abnormality.  Initial lab work is overall reassuring, with minimal troponin elevation which is likely demand.  No evidence of acute ischemia on her EKG and she denies any chest pain.  Given her recent surgery, will check a CT, plan for admission for acute hypoxic respiratory failure.  COVID pending.  ____________________________________________  FINAL CLINICAL IMPRESSION(S) / ED DIAGNOSES  Final diagnoses:  Acute respiratory  failure with hypoxia ([place])     MEDICATIONS GIVEN DURING THIS VISIT:  Medications  iohexol (OMNIPAQUE) 350 MG/ML injection 60 mL (has no administration in time range)     ED Discharge Orders    None       Note:  This document was prepared using Dragon voice recognition software and may include unintentional dictation errors.   Duffy Bruce, MD 12/04/18 1524

## 2018-12-04 NOTE — ED Notes (Signed)
Patient transported to CT 

## 2018-12-04 NOTE — ED Notes (Signed)
Called and spoke to family, Alyse Low, with an update

## 2018-12-04 NOTE — ED Notes (Signed)
Pt stated she is hungry. Called dietary to get meal tray sent to ED.

## 2018-12-04 NOTE — ED Notes (Signed)
Pt given meal tray and water 

## 2018-12-04 NOTE — ED Notes (Signed)
Pt otf for imaging 

## 2018-12-04 NOTE — ED Notes (Signed)
Pt returned from CT °

## 2018-12-04 NOTE — ED Notes (Signed)
After conducting patient home medication history via phone with patient's granddaughter Alyse Low), she requested that we try and expedite the patient's room assignment so that she could come and be with the patient. The granddaughter also requested that she be notified once patient had been moved to nursing unit and that we alert the [facility] screening staff that she was approved to join patient on the unit. The patient has suffered a stroke and has significant difficulty communicating without the assistance of family.  ** The above is intended solely for informational and/or communicative purposes. It should in no way be considered an endorsement of any specific treatment, therapy or action. **

## 2018-12-04 NOTE — ED Notes (Signed)
Family called in and asking about pt and if she can come home. Family spoke with this nurse and EDP. New orders

## 2018-12-04 NOTE — ED Triage Notes (Signed)
Pt here via [facility] for SOB and cough. Per home health nurse pt's o2 was in the low 70s on RA during her visit today. EMS placed pt on 10 Liters NRB and pt's O2 sat went to 100.

## 2018-12-04 NOTE — ED Notes (Signed)
Daughter, [name] notified of plan of care

## 2018-12-04 NOTE — ED Provider Notes (Signed)
-----------------------------------------   4:26 PM on 12/04/2018 -----------------------------------------  COVID negative.  CT angiogram negative for PE but does show some lower lung infiltrates which in the setting of hypoxia and elevated procalcitonin and recent surgery are concerning for pneumonia.  With her recent orthopedic surgery necessitating IV antibiotics, general anesthesia, prolonged healthcare facility exposure, she is at risk for more complicated infections and so I have ordered Levaquin and vancomycin in light of her penicillin allergy.  I will discuss with the hospitalist for further management.  Final diagnoses:  Acute respiratory failure with hypoxia [facility])  Healthcare associated pneumonia    Carrie Mew, MD 12/04/18 [phone]

## 2018-12-04 NOTE — ED Notes (Signed)
External catheter purewick inserted by this tech

## 2018-12-04 NOTE — ED Notes (Signed)
Spoke to pt granddaughter and provided an update on pt. She stated she would share information with the rest of the family at this time.

## 2018-12-04 NOTE — H&P (Cosign Needed)
[place] at [place] NAME: Yolanda Turner    MR#:  [id number]  DATE OF BIRTH:  1925-10-02  DATE OF ADMISSION:  12/04/2018  PRIMARY CARE PHYSICIAN: Albina Billet, MD   REQUESTING/REFERRING PHYSICIAN: Brenton Grills, MD  CHIEF COMPLAINT:   Chief Complaint  Patient presents with   Shortness of Breath    HISTORY OF PRESENT ILLNESS:  Yolanda Turner  is a 85 y.o. female with a known history of recent hip surgery with discharge on 12/02/2018, diabetes mellitus, GERD, history of stroke with residual expressive aphasia, hypertension.  She presented to the emergency room via EMS services from home where she lives with her granddaughter.  The family had noticed increased confusion over the last day with nonproductive cough.  EMS services was called and patient was found to be hypoxic in the 70s with decreased responsiveness.  She was given supplemental oxygen with nonrebreather with improved oxygen saturations and improved mentation.  At the time that I am seeing her, she has oxygen per nasal cannula with saturations 95 to 100%.  She has a history of stroke with residual expressive aphasia.  However I have spoken with her daughter [name], who has noticed increased cough which is nonproductive as well as increased fatigue with shortness of breath over the last 1 to 2 days.  Patient and her daughter have noted no fevers.  She denies chills.  She denies chest pain.  Patient denies abdominal pain.  She has experienced nausea however no vomiting.  Given patient's recent history of hip surgery, CTA chest was completed with no evidence of pulmonary embolism.  However there is bilateral basilar infiltrates as well as small bilateral pleural effusions.  Labs on arrival with BNP 642.  Arterial blood gas demonstrates pH 7.4 with PCO2 57 and PO2 52 with oxygen saturation 86.4.  Recent echocardiogram on 10/18/2018 demonstrated 20 to 25% ejection fraction.  High-sensitivity  troponin is 23.  She has been admitted to the hospitalist service for acute hypoxic respiratory failure with bilateral pneumonia. PAST MEDICAL HISTORY:   Past Medical History:  Diagnosis Date   Carotid artery stenosis    Carotid artery stenosis    Collagen vascular disease (HCC)    Coronary artery disease    Diabetes mellitus without complication (HCC)    diet control   GERD (gastroesophageal reflux disease)    Hypertension    Myocardial infarction ([place])    Osteoporosis    Peripheral vascular disease (HCC)    Pneumonia    Renal artery stenosis (HCC)    Status post double vessel coronary artery bypass 2005   Stroke [facility])    Uterine cancer ([place])     PAST SURGICAL HISTORY:   Past Surgical History:  Procedure Laterality Date   ABDOMINAL HYSTERECTOMY     CAROTID ENDARTERECTOMY     CATARACT EXTRACTION W/PHACO Right 09/28/2015   Procedure: CATARACT EXTRACTION PHACO AND INTRAOCULAR LENS PLACEMENT ([place]);  Surgeon: Estill Cotta, MD;  Location: [facility] ORS;  Service: Ophthalmology;  Laterality: Right;  [place] 041:18 AP% 28.6 CDE 114.94 fluid pack lot # [id number]   CORONARY ARTERY BYPASS GRAFT     HIP ARTHROPLASTY Left 11/29/2018   Procedure: ARTHROPLASTY BIPOLAR HIP (HEMIARTHROPLASTY);  Surgeon: Corky Mull, MD;  Location: [facility] ORS;  Service: Orthopedics;  Laterality: Left;   PERIPHERAL VASCULAR CATHETERIZATION Right 01/22/2015   Procedure: Carotid PTA/Stent Intervention;  Surgeon: Katha Cabal, MD;  Location: [facility];  Service: Cardiovascular;  Laterality:  Right;   uterine cancer surgery      SOCIAL HISTORY:   Social History   Tobacco Use   Smoking status: Former Smoker   Smokeless tobacco: Never Used  Substance Use Topics   Alcohol use: No    FAMILY HISTORY:   Family History  Problem Relation Age of Onset   Lung cancer Daughter     DRUG ALLERGIES:   Allergies  Allergen Reactions   Penicillins Rash    Has patient had a  PCN reaction causing immediate rash, facial/tongue/throat swelling, SOB or lightheadedness with hypotension: No Has patient had a PCN reaction causing severe rash involving mucus membranes or skin necrosis: No Has patient had a PCN reaction that required hospitalization: No Has patient had a PCN reaction occurring within the last 10 years: No If all of the above answers are "NO", then may proceed with Cephalosporin use.    REVIEW OF SYSTEMS:   Review of Systems  Constitutional: Negative for chills and fever.  HENT: Positive for sore throat. Negative for congestion and sinus pain.   Eyes: Negative for blurred vision and double vision.  Respiratory: Negative for cough, shortness of breath and wheezing.   Cardiovascular: Negative for chest pain and palpitations.  Gastrointestinal: Negative for abdominal pain, diarrhea, nausea and vomiting.  Genitourinary: Negative for dysuria and flank pain.  Musculoskeletal: Negative for back pain, myalgias and neck pain.  Skin: Negative.  Negative for itching and rash.  Neurological: Positive for weakness (general weakness). Negative for dizziness, loss of consciousness and headaches.       Patient has expressive aphasia as result of past stroke  Psychiatric/Behavioral: Negative.  Negative for depression.     MEDICATIONS AT HOME:   Prior to Admission medications   Medication Sig Start Date End Date Taking? Authorizing Provider  ondansetron (ZOFRAN) 4 MG tablet Take 1 tablet (4 mg total) by mouth every 6 (six) hours as needed for nausea. 12/01/18  Yes Salary, Holly Bodily D, MD  oxyCODONE (OXY IR/ROXICODONE) 5 MG immediate release tablet Take 1-2 tablets (5-10 mg total) by mouth every 4 (four) hours as needed for moderate pain (pain score 4-6). 12/01/18  Yes Reche Dixon, PA-C  traMADol (ULTRAM) 50 MG tablet Take 1 tablet (50 mg total) by mouth every 6 (six) hours as needed for moderate pain. 12/01/18  Yes Reche Dixon, PA-C  enoxaparin (LOVENOX) 40 MG/0.4ML  injection Inject 0.4 mLs (40 mg total) into the skin daily for 14 days. 12/01/18 12/15/18  Reche Dixon, PA-C  gabapentin (NEURONTIN) 300 MG capsule Take 600 mg by mouth at bedtime.     [provider]  lisinopril (ZESTRIL) 5 MG tablet Take 5 mg by mouth daily.    [provider]  metoprolol succinate (TOPROL-XL) 25 MG 24 hr tablet Take 0.5 tablets (12.5 mg total) by mouth daily. 10/19/18 12/18/18  Henreitta Leber, MD  simvastatin (ZOCOR) 40 MG tablet Take 40 mg by mouth daily.     [provider]      VITAL SIGNS:  Blood pressure (!) 78/57, pulse 93, temperature 98.6 F (37 C), temperature source Oral, resp. rate 17, height 5\' 1"  (1.549 m), weight 40.8 kg, SpO2 100 %.  PHYSICAL EXAMINATION:  Physical Exam  GENERAL:  85 y.o.-year-old patient lying in the bed with no acute distress.  EYES: Pupils equal, round, reactive to light and accommodation. No scleral icterus. Extraocular muscles intact.  HEENT: Head atraumatic, normocephalic. NECK:  Supple, no jugular venous distention. No thyroid enlargement, no tenderness.  LUNGS: Breath sounds diminished in the bases bilaterally, no wheezing, rales,rhonchi or crepitation. No use of accessory muscles of respiration.  CARDIOVASCULAR: Regular rate and rhythm, S1, S2 normal. No murmurs, rubs, or gallops.  ABDOMEN: Soft, nondistended, nontender. Bowel sounds present. No organomegaly or mass.  EXTREMITIES: No pedal edema, cyanosis, or clubbing.  NEUROLOGIC: Expressive aphasia. Muscle strength 3/5 in all extremities. Sensation intact. Gait not checked.  PSYCHIATRIC:  Normal affect and good eye contact. SKIN: No obvious rash, lesion, or ulcer.   LABORATORY PANEL:   CBC Recent Labs  Lab 12/04/18 1357  WBC 6.9  HGB 10.6*  HCT 33.4*  PLT 248   ------------------------------------------------------------------------------------------------------------------  Chemistries  Recent Labs  Lab 12/04/18 1357  NA 135  K 4.6  CL  99  CO2 30  GLUCOSE 146*  BUN 13  CREATININE 0.56  CALCIUM 8.8*  AST 15  ALT 8  ALKPHOS 46  BILITOT 0.5   ------------------------------------------------------------------------------------------------------------------  Cardiac Enzymes No results for input(s): TROPONINI in the last 168 hours. ------------------------------------------------------------------------------------------------------------------  RADIOLOGY:  Ct Angio Chest Pe W And/or Wo Contrast  Result Date: 12/04/2018 CLINICAL DATA:  Shortness of breath, recent hip replacement EXAM: CT ANGIOGRAPHY CHEST WITH CONTRAST TECHNIQUE: Multidetector CT imaging of the chest was performed using the standard protocol during bolus administration of intravenous contrast. Multiplanar CT image reconstructions and MIPs were obtained to evaluate the vascular anatomy. CONTRAST:  42mL OMNIPAQUE IOHEXOL 350 MG/ML SOLN COMPARISON:  None. FINDINGS: Cardiovascular: Satisfactory opacification of the pulmonary arteries to the segmental level. No evidence of pulmonary embolism. 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 Cardiomegaly. No pericardial effusion. Mediastinum/Nodes: No enlarged mediastinal, hilar, or axillary lymph nodes. Thyroid gland, trachea, and esophagus demonstrate no significant findings. Lungs/Pleura: Biapical pleuroparenchymal scarring. Small bilateral pleural effusions and associated atelectasis or consolidation. Upper Abdomen: No acute abnormality. Musculoskeletal: No chest wall abnormality. No acute or significant osseous findings. Review of the MIP images confirms the above findings. IMPRESSION: 1.  Negative examination for pulmonary embolism. 2. Small bilateral pleural effusions and associated atelectasis or consolidation. 3.  Cardiomegaly. 4. Severe aortic atherosclerosis with a likely chronic, partially  calcified dissection or penetrating ulceration of the mid descending thoracic aorta (series 5, image 62). Electronically Signed   By: Eddie Candle M.D.   On: 12/04/2018 15:46   Dg Chest Portable 1 View  Result Date: 12/04/2018 CLINICAL DATA:  Shortness of breath.  Cough. EXAM: PORTABLE CHEST 1 VIEW COMPARISON:  11/28/2018. FINDINGS: Prior CABG. Cardiomegaly. No pulmonary venous congestion. COPD cannot be excluded. No focal infiltrate. No pleural effusion or pneumothorax. Stable mild left pleural thickening noted consistent scarring. Thoracic spine scoliosis and degenerative change. IMPRESSION: 1.  Prior CABG.  Cardiomegaly.  No pulmonary venous congestion. 2. COPD cannot be excluded. No focal pulmonary infiltrate. Stable mild bilateral pleural thickening noted most likely related to scarring. Electronically Signed   By: Marcello Moores  Register   On: 12/04/2018 14:50      IMPRESSION AND PLAN:   1.  Acute hypoxic respiratory failure - Currently patient with oxygen saturations 95 to 100% on oxygen per nasal cannula at 6 L/min. - DuoNebs every 6 hours as needed for shortness of breath or wheezing - Continue continuous pulse ox monitoring  2.  Bilateral lower lobe pneumonia --She received IV vancomycin and Levaquin in the emergency room - We will continue antibiotic management with azithromycin, cefepime, and vancomycin - We will get sputum  culture - DuoNebs every 6 hours as needed for shortness of breath  3.  Status post right hip repair - We will consult physical therapy for supportive care - CTA chest negative for pulmonary embolism - DVT prophylaxis initiated with Lovenox  4.  History of stroke with expressive aphasia - Supportive care --She is on telemetry monitoring  5.  Hypertension - Lisinopril and metoprolol continued  6.  Pain secondary to right hip surgery - We will treat with analgesic  DVT and PPI prophylaxis initiated   All the records are reviewed and case discussed with ED  provider. The plan of care was discussed in details with the patient (and family). I answered all questions. The patient agreed to proceed with the above mentioned plan. Further management will depend upon hospital course.   CODE STATUS: Full code  TOTAL TIME TAKING CARE OF THIS PATIENT: 45 minutes.    [place] on 12/04/2018 at 5:58 PM  Pager - [phone]  After 6pm go to [email or web address] - Proofreader  [facility]  Office  [phone]  CC: Primary care physician; Albina Billet, MD   Note: This dictation was prepared with Dragon dictation along with smaller phrase technology. Any transcriptional errors that result from this process are unintentional.

## 2018-12-05 ENCOUNTER — Other Ambulatory Visit: Payer: Self-pay

## 2018-12-05 LAB — PROTIME-INR
INR: 1.1 (ref 0.8–1.2)
Prothrombin Time: 13.9 seconds (ref 11.4–15.2)

## 2018-12-05 LAB — TROPONIN I (HIGH SENSITIVITY)
Troponin I (High Sensitivity): 24 ng/L — ABNORMAL HIGH (ref <18)
Troponin I (High Sensitivity): 25 ng/L — ABNORMAL HIGH (ref <18)

## 2018-12-05 LAB — BASIC METABOLIC PANEL
Anion gap: 8 (ref 5–15)
BUN: 10 mg/dL (ref 8–23)
CO2: 32 mmol/L (ref 22–32)
Calcium: 9.2 mg/dL (ref 8.9–10.3)
Chloride: 98 mmol/L (ref 98–111)
Creatinine, Ser: 0.52 mg/dL (ref 0.44–1.00)
GFR calc Af Amer: >60 mL/min (ref >60)
GFR calc non Af Amer: >60 mL/min (ref >60)
Glucose, Bld: 133 mg/dL — ABNORMAL HIGH (ref 70–99)
Potassium: 4.5 mmol/L (ref 3.5–5.1)
Sodium: 138 mmol/L (ref 135–145)

## 2018-12-05 LAB — CBC
HCT: 32.5 % — ABNORMAL LOW (ref 36.0–46.0)
Hemoglobin: 10.4 g/dL — ABNORMAL LOW (ref 12.0–15.0)
MCH: 28.8 pg (ref 26.0–34.0)
MCHC: 32 g/dL (ref 30.0–36.0)
MCV: 90 fL (ref 80.0–100.0)
Platelets: 245 10*3/uL (ref 150–400)
RBC: 3.61 MIL/uL — ABNORMAL LOW (ref 3.87–5.11)
RDW: 12.9 % (ref 11.5–15.5)
WBC: 6.7 10*3/uL (ref 4.0–10.5)
nRBC: 0 % (ref 0.0–0.2)

## 2018-12-05 LAB — MRSA PCR SCREENING: MRSA by PCR: NEGATIVE

## 2018-12-05 LAB — CREATININE, SERUM
Creatinine, Ser: 0.42 mg/dL — ABNORMAL LOW (ref 0.44–1.00)
GFR calc Af Amer: >60 mL/min (ref >60)
GFR calc non Af Amer: >60 mL/min (ref >60)

## 2018-12-05 MED ORDER — VANCOMYCIN HCL IN DEXTROSE 750-5 MG/150ML-% IV SOLN: 750.0000 mg | INTRAVENOUS | Status: DC

## 2018-12-05 MED ORDER — LEVOFLOXACIN IN D5W 750 MG/150ML IV SOLN: 750.0000 mg | INTRAVENOUS | Status: DC

## 2018-12-05 MED ORDER — ENSURE ENLIVE PO LIQD
237.0000 mL | Freq: Two times a day (BID) | ORAL | Status: DC
  Administered 2018-12-05 – 2018-12-06 (×2): 237 mL via ORAL

## 2018-12-05 MED ORDER — ENOXAPARIN SODIUM 30 MG/0.3ML ~~LOC~~ SOLN
30.0000 mg | SUBCUTANEOUS | Status: DC
  Administered 2018-12-05: 22:00:00 30 mg via SUBCUTANEOUS
  Filled 2018-12-05: qty 0.3

## 2018-12-05 NOTE — Consult Note (Addendum)
Pharmacy Antibiotic Note  Yolanda Turner is a 85 y.o. female admitted on 12/04/2018 with CAP.  Pharmacy has been consulted for antibiotic dosing of Levofloxacin and Vancomycin -pending contengencies. Per consult note "Pharmacist to investigate beta-lactam allergy. If history of intolerance, mild allergy, or documented history of use of cephalosporins, pharmacy can adjust levofloxacin to cefepime and azithromycin."  Allergy PCN: Cefazolin given on 11/29/2018 and no recorded issues documented. I attempted to ask the patient about her h/o PCN allergy, but she wasn't able to communicate well.   Given patient had cefazolin, will start the patient on cefepime and azithromycin.   Plan: 1) Will start Cefepime 1 g Q12H at 0500 on 6/24  2) Will start Azithromycin 500 mg Q24H @ 1600 on 6/24  3) Vancomycin 1g IV x 1 dose given in ED. Will start vancomycin 750mg  IV every 48 hours. Goal AUC 400-550. Expected AUC: 434 SCr used: 0.8   Height: 5\' 1"  (154.9 cm) Weight: 90 lb (40.8 kg) IBW/kg (Calculated) : 47.8  Temp (24hrs), Avg:98.6 F (37 C), Min:98.6 F (37 C), Max:98.6 F (37 C)  Recent Labs  Lab 11/28/18 0436 11/29/18 0746 11/30/18 0606 12/04/18 1357 12/04/18 2334  WBC 8.6  --  10.1 6.9 7.4  CREATININE 0.93 0.84  --  0.56 0.42*    Estimated Creatinine Clearance: 30.1 mL/min (A) (by C-G formula based on SCr of 0.42 mg/dL (L)).    Allergies  Allergen Reactions  . Penicillins Rash    Has patient had a PCN reaction causing immediate rash, facial/tongue/throat swelling, SOB or lightheadedness with hypotension: No Has patient had a PCN reaction causing severe rash involving mucus membranes or skin necrosis: No Has patient had a PCN reaction that required hospitalization: No Has patient had a PCN reaction occurring within the last 10 years: No If all of the above answers are "NO", then may proceed with Cephalosporin use.    Antimicrobials this admission: 6/23 Vanc>> 6/23 Levofloxacin x1  dose  6/24 Azithromycin >> 6/24 Cefepime >>   Dose adjustments this admission: 6/23 Vanc x1 dose  6/23 Levofloxacin x1 dose   Microbiology results: 6/23 BCx: pending   Thank you for allowing pharmacy to be a part of this patient's care.  Pernell Dupre, PharmD, BCPS Clinical Pharmacist 12/05/2018 2:24 AM

## 2018-12-05 NOTE — Progress Notes (Signed)
PHARMACIST - PHYSICIAN COMMUNICATION  CONCERNING:  Enoxaparin (Lovenox) for DVT Prophylaxis    RECOMMENDATION: Patient was prescribed enoxaprin 40mg  q24 hours for VTE prophylaxis.   Filed Weights   12/04/18 1352 12/05/18 0246  Weight: 90 lb (40.8 kg) 80 lb 7.5 oz (36.5 kg)    Body mass index is 15.2 kg/m.  Estimated Creatinine Clearance: 26.9 mL/min (A) (by C-G formula based on SCr of 0.42 mg/dL (L)).  Patient is candidate for enoxaparin 30mg  every 24 hours based on CrCl <20ml/min and  Weight less then 45kg for female  DESCRIPTION: Pharmacy has adjusted enoxaparin dose per [facility] policy.   Patient is now receiving enoxaparin 30mg  every 24 hours.  Pernell Dupre, PharmD, BCPS Clinical Pharmacist 12/05/2018 4:41 AM

## 2018-12-05 NOTE — Progress Notes (Signed)
[place] at [place] NAME: Yolanda Turner    MR#:  [id number]  DATE OF BIRTH:  28-Nov-1925  SUBJECTIVE:   patient with expressive aphasia from previous CVA here with SOB  REVIEW OF SYSTEMS:    Review of Systems  Constitutional: Negative for fever, chills weight loss HENT: Negative for ear pain, nosebleeds, congestion, facial swelling, rhinorrhea, neck pain, neck stiffness and ear discharge.   Respiratory: ++cough, shortness of breath,no wheezing  Cardiovascular: Negative for chest pain, palpitations and leg swelling.  Gastrointestinal: Negative for heartburn, abdominal pain, vomiting, diarrhea or consitpation Genitourinary: Negative for dysuria, urgency, frequency, hematuria Musculoskeletal: Negative for back pain or joint pain Neurological: Negative for dizziness, seizures, syncope, focal weakness,  numbness and headaches.  Expressive aphasia Hematological: Does bruise/bleed easily.  Psychiatric/Behavioral: Negative for hallucinations dysphoric mood    Tolerating Diet: yes      DRUG ALLERGIES:   Allergies  Allergen Reactions  . Penicillins Rash    Has patient had a PCN reaction causing immediate rash, facial/tongue/throat swelling, SOB or lightheadedness with hypotension: No Has patient had a PCN reaction causing severe rash involving mucus membranes or skin necrosis: No Has patient had a PCN reaction that required hospitalization: No Has patient had a PCN reaction occurring within the last 10 years: No If all of the above answers are "NO", then may proceed with Cephalosporin use.    VITALS:  Blood pressure (!) 132/53, pulse 76, temperature 98.2 F (36.8 C), temperature source Oral, resp. rate 11, height 5\' 1"  (1.549 m), weight 36.5 kg, SpO2 100 %.  PHYSICAL EXAMINATION:  Constitutional: Appears frail . No distress. HENT: Normocephalic. Marland Kitchen Oropharynx is clear and moist.  Eyes: Conjunctivae and EOM are normal. PERRLA, no scleral  icterus.  Neck: Normal ROM. Neck supple. No JVD. No tracheal deviation. CVS: RRR, S1/S2 +, no murmurs, no gallops, no carotid bruit.  Pulmonary: normal effort with decreased breath sounds in all fields no rales, rhonchii wheeezing Abdominal: Soft. BS +,  no distension, tenderness, rebound or guarding.  Musculoskeletal: Normal range of motion. No edema and no tenderness.  Neuro: Alert. CN 2-12 grossly intact. Expressive aphasia 3/5 strength Skin: Skin is warm and dry. No rash noted. Psychiatric: Normal mood and affect.      LABORATORY PANEL:   CBC Recent Labs  Lab 12/05/18 0433  WBC 6.7  HGB 10.4*  HCT 32.5*  PLT 245   ------------------------------------------------------------------------------------------------------------------  Chemistries  Recent Labs  Lab 12/04/18 1357  12/05/18 0433  NA 135  --  138  K 4.6  --  4.5  CL 99  --  98  CO2 30  --  32  GLUCOSE 146*  --  133*  BUN 13  --  10  CREATININE 0.56   < > 0.52  CALCIUM 8.8*  --  9.2  AST 15  --   --   ALT 8  --   --   ALKPHOS 46  --   --   BILITOT 0.5  --   --    < > = values in this interval not displayed.   ------------------------------------------------------------------------------------------------------------------  Cardiac Enzymes No results for input(s): TROPONINI in the last 168 hours. ------------------------------------------------------------------------------------------------------------------  RADIOLOGY:  Ct Angio Chest Pe W And/or Wo Contrast  Result Date: 12/04/2018 CLINICAL DATA:  Shortness of breath, recent hip replacement EXAM: CT ANGIOGRAPHY CHEST WITH CONTRAST TECHNIQUE: Multidetector CT imaging of the chest was performed using the standard protocol during bolus administration of intravenous  contrast. Multiplanar CT image reconstructions and MIPs were obtained to evaluate the vascular anatomy. CONTRAST:  5mL OMNIPAQUE IOHEXOL 350 MG/ML SOLN COMPARISON:  None. FINDINGS:  Cardiovascular: Satisfactory opacification of the pulmonary arteries to the segmental level. No evidence of pulmonary embolism. 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 Cardiomegaly. No pericardial effusion. Mediastinum/Nodes: No enlarged mediastinal, hilar, or axillary lymph nodes. Thyroid gland, trachea, and esophagus demonstrate no significant findings. Lungs/Pleura: Biapical pleuroparenchymal scarring. Small bilateral pleural effusions and associated atelectasis or consolidation. Upper Abdomen: No acute abnormality. Musculoskeletal: No chest wall abnormality. No acute or significant osseous findings. Review of the MIP images confirms the above findings. IMPRESSION: 1.  Negative examination for pulmonary embolism. 2. Small bilateral pleural effusions and associated atelectasis or consolidation. 3.  Cardiomegaly. 4. 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 Electronically Signed   By: Eddie Candle M.D.   On: 12/04/2018 15:46   Dg Chest Portable 1 View  Result Date: 12/04/2018 CLINICAL DATA:  Shortness of breath.  Cough. EXAM: PORTABLE CHEST 1 VIEW COMPARISON:  11/28/2018. FINDINGS: Prior CABG. Cardiomegaly. No pulmonary venous congestion. COPD cannot be excluded. No focal infiltrate. No pleural effusion or pneumothorax. Stable mild left pleural thickening noted consistent scarring. Thoracic spine scoliosis and degenerative change. IMPRESSION: 1.  Prior CABG.  Cardiomegaly.  No pulmonary venous congestion. 2. COPD cannot be excluded. No focal pulmonary infiltrate. Stable mild bilateral pleural thickening noted most likely related to scarring. Electronically Signed   By: Marcello Moores  Register   On: 12/04/2018 14:50     ASSESSMENT AND PLAN:   85 year old female  status post recent right hip repair who presented to the emergency room due to shortness of breath.  1.  Acute hypoxic respiratory failure in the setting of HCAP Continue cefepime and azithromycin Wean oxygen as tolerated CT chest negative for PE. COVID-19 testing is negative.  2.  Essential hypertension: Continue metoprolol and lisinopril  3.  Hyperlipidemia: Continue statin  4.  Status post right hip repair: PT consultation 5.  History of stroke with expressive aphasia 6.  Elevated troponin: This is due to demand ischemia from pneumonia.  Troponins are flat.  Management plans discussed with the patient and she is in agreement.  CODE STATUS: DNR  TOTAL TIME TAKING CARE OF THIS PATIENT: 30 minutes.     POSSIBLE D/C tomorrow, DEPENDING ON CLINICAL CONDITION.   Bettey Costa M.D on 12/05/2018 at 10:38 AM  Between 7am to 6pm - Pager - [phone] After 6pm go to [email or web address] - password [email or web address] [place] [facility]  Office  [phone]  CC: Primary care physician; Albina Billet, MD  Note: This dictation was prepared with Dragon dictation along with smaller phrase technology. Any transcriptional errors that result from this process are unintentional.

## 2018-12-05 NOTE — TOC Initial Note (Signed)
Transition of Care [facility]) - Initial/Assessment Note    Patient Details  Name: Yolanda Turner MRN: [id number] Date of Birth: April 19, 1926  Transition of Care [facility]) CM/SW Contact:    Shelbie Hutching, RN Phone Number: 12/05/2018, 11:30 AM  Clinical Narrative:                 Patient admitted to the hospital for acute respiratory failure due to pneumonia.  Patient was recently discharged on 6/21 after a hip fracture.  Patient is from home where she lives with her granddaughter.  Patient's daughter and another granddaughter are very involved in patient's care.  Patient has 24 hr supervision.  Patient has expressive aphasia from previous stroke.  Home Health is being arranged with [facility].  [facility] Lemmings will discharge the patient from their service and [place] will pick her up.  Patient has all needed DME at home unless she needs O2 at discharge.  If patient does need oxygen family is okay with using [employer].  RNCM will cont to follow.     Expected Discharge Plan: [place] Barriers to Discharge: Continued Medical Work up   Patient Goals and CMS Choice Patient states their goals for this hospitalization and ongoing recovery are:: Grandaughter is POA and would like for the patient to return home with home health CMS Medicare.gov Compare Post Acute Care list provided to:: Patient Represenative (must comment)(Christy- grandaughter) Choice offered to / list presented to : [place] / Guardian  Expected Discharge Plan and Services Expected Discharge Plan: [place]   Discharge Planning Services: CM Consult Post Acute Care Choice: Home Health, Durable Medical Equipment Living arrangements for the past 2 months: Single Family Home Expected Discharge Date: 12/07/18                           [facility] Agency: [place] ([place]) Date [place]: 12/05/18 Time [place]: 1128 Representative spoke with at  [place]: Floydene Flock  Prior Living Arrangements/Services Living arrangements for the past 2 months: [place] Lives with:: Relatives, Other (Comment)(Grandaughter) Patient language and need for interpreter reviewed:: No Do you feel safe going back to the place where you live?: Yes      Need for Family Participation in Patient Care: Yes (Comment) Care giver support system in place?: Yes (comment)(daughter and grandaughter) Current home services: DME Criminal Activity/Legal Involvement Pertinent to Current Situation/Hospitalization: No - Comment as needed  Activities of Daily Living      Permission Sought/Granted Permission sought to share information with : Case Manager, Customer service manager, Other (comment)       Permission granted to share info w AGENCY: [facility]        Emotional Assessment Appearance:: Appears stated age Attitude/Demeanor/Rapport: Engaged Affect (typically observed): Pleasant, Quiet Orientation: : Oriented to Self Alcohol / Substance Use: Not Applicable Psych Involvement: No (comment)  Admission diagnosis:  Acute respiratory failure with hypoxia ([place]) [J96.01] HCAP (healthcare-associated pneumonia) [J18.9] Patient Active Problem List   Diagnosis Date Noted  . Respiratory failure, acute ([place]) 12/04/2018  . Hip fracture ([place]) 11/28/2018  . Goals of care, counseling/discussion   . Palliative care by specialist   . GERD (gastroesophageal reflux disease) 10/16/2018  . CAD (coronary artery disease) 10/16/2018  . Atrial fibrillation with RVR ([place]) 10/16/2018  . CVA (cerebral vascular accident) ([place]) 05/07/2018  . Hypertension 02/02/2018  .  Diabetes mellitus without complication ([place]) 92/76/3943  . Carotid artery stenosis 02/02/2018  . Acute CVA (cerebrovascular accident) ([place]) 04/04/2017  . Speech problem 04/02/2017  . Confusion 04/02/2017  . TIA (transient ischemic attack) 04/02/2017   PCP:  Albina Billet,  MD Pharmacy:   [facility], [place] [address] Phone: [phone] Fax: [phone]     Social Determinants of Health (SDOH) Interventions    Readmission Risk Interventions No flowsheet data found.

## 2018-12-05 NOTE — Progress Notes (Signed)
Initial Nutrition Assessment  RD working remotely.  DOCUMENTATION CODES:   Underweight  INTERVENTION:  Will downgrade diet to dysphagia 2 (fine chop) with thin liquids.  Provide Ensure Enlive po BID, each supplement provides 350 kcal and 20 grams of protein.  Provide Magic cup TID with meals, each supplement provides 290 kcal and 9 grams of protein.  NUTRITION DIAGNOSIS:   Severe Malnutrition related to social / environmental circumstances(inadequate oral intake, advanced age) as evidenced by severe fat depletion, severe muscle depletion.  GOAL:   Patient will meet greater than or equal to 90% of their needs  MONITOR:   PO intake, Supplement acceptance, Labs, Weight trends, Skin, I & O's  REASON FOR ASSESSMENT:   Other (Comment)(Low BMI)    ASSESSMENT:   85 year old female with PMHx of PVD, CAD s/p CABG, hx MI, HTN, hx CVA with expressive aphasia, GERD, DM, OP, who recently had right hip repair now admitted with acute hypoxic respiratory failure, bilateral lower lobe PNA.   Met with patient at bedside. She is known to this RD from multiple previous admissions. Patient reports she is not very hungry but that she will be willing to try some breakfast today. She does not have her dentures with her and reports they are at home. She typically needs dysphagia 3 diet with dentures, so will downgrade to dysphagia 2 since her dentures are not here. Patient does not typically eat well during admission. At home per daughter she eats biscuit with gravy and vegetables. Weight appears stable per chart.  Medications reviewed and include: gabapentin, lisinopril, azithromycin, cefepime.  Labs reviewed.  Discussed with RN.  NUTRITION - FOCUSED PHYSICAL EXAM:    Most Recent Value  Orbital Region  Severe depletion  Upper Arm Region  Severe depletion  Thoracic and Lumbar Region  Severe depletion  Buccal Region  Severe depletion  Temple Region  Severe depletion  Clavicle Bone Region   Severe depletion  Clavicle and Acromion Bone Region  Severe depletion  Scapular Bone Region  Severe depletion  Dorsal Hand  Severe depletion  Patellar Region  Severe depletion  Anterior Thigh Region  Severe depletion  Posterior Calf Region  Severe depletion  Edema (RD Assessment)  None  Hair  Reviewed  Eyes  Reviewed  Mouth  Reviewed  Skin  Reviewed [ecchymosis]  Nails  Reviewed     Diet Order:   Diet Order            Diet heart healthy/carb modified Room service appropriate? Yes; Fluid consistency: Thin  Diet effective now             EDUCATION NEEDS:   No education needs have been identified at this time  Skin:  Skin Assessment: Skin Integrity Issues:(DTI to sacrum; closed incision left hip)  Last BM:  12/04/2018 per chart  Height:   Ht Readings from Last 1 Encounters:  12/05/18 _0  (1.549 m)   Weight:   Wt Readings from Last 1 Encounters:  12/05/18 36.5 kg   Ideal Body Weight:  47.7 kg  BMI:  Body mass index is 15.2 kg/m.  Estimated Nutritional Needs:   Kcal:  1250-1450  Protein:  60-70 grams  Fluid:  1.2-1.4 L/day  Willey Blade, MS, RD, LDN Office: [phone] Pager: [phone] After Hours/Weekend Pager: [phone]

## 2018-12-05 NOTE — ED Notes (Signed)
ED TO INPATIENT HANDOFF REPORT  ED Nurse Name and Phone #:  Anson Crofts Name/Age/Gender Yolanda Turner 85 y.o. female Room/Bed: ED06A/ED06A  Code Status   Code Status: DNR  Home/SNF/Other Home Patient oriented to: self and situation Is this baseline? Yes   Triage Complete: Triage complete  Chief Complaint sob  Triage Note Pt here via [facility] for SOB and cough. Per home health nurse pt's o2 was in the low 70s on RA during her visit today. EMS placed pt on 10 Liters NRB and pt's O2 sat went to 100.    Allergies Allergies  Allergen Reactions  . Penicillins Rash    Has patient had a PCN reaction causing immediate rash, facial/tongue/throat swelling, SOB or lightheadedness with hypotension: No Has patient had a PCN reaction causing severe rash involving mucus membranes or skin necrosis: No Has patient had a PCN reaction that required hospitalization: No Has patient had a PCN reaction occurring within the last 10 years: No If all of the above answers are "NO", then may proceed with Cephalosporin use.    Level of Care/Admitting Diagnosis ED Disposition    ED Disposition Condition [place] [facility] Area: [place] [[id number]]  Level of Care: Med-Surg [16]  Covid Evaluation: Confirmed COVID Negative  Diagnosis: Respiratory failure, acute [facility]) [[id number]]  Admitting Physician: Mayer Camel [[id number]]  Attending Physician: Mayer Camel [[id number]]  Estimated length of stay: past midnight tomorrow  Certification:: I certify this patient will need inpatient services for at least 2 midnights  PT Class (Do Not Modify): Inpatient [101]  PT Acc Code (Do Not Modify): Private [1]       B Medical/Surgery History Past Medical History:  Diagnosis Date  . Carotid artery stenosis   . Carotid artery stenosis   . Collagen vascular disease ([place])   . Coronary artery disease   . Diabetes mellitus without complication (HCC)    diet control  . GERD  (gastroesophageal reflux disease)   . Hypertension   . Myocardial infarction ([place])   . Osteoporosis   . Peripheral vascular disease ([place])   . Pneumonia   . Renal artery stenosis ([place])   . Status post double vessel coronary artery bypass 2005  . Stroke ([place])   . Uterine cancer [facility])    Past Surgical History:  Procedure Laterality Date  . ABDOMINAL HYSTERECTOMY    . CAROTID ENDARTERECTOMY    . CATARACT EXTRACTION W/PHACO Right 09/28/2015   Procedure: CATARACT EXTRACTION PHACO AND INTRAOCULAR LENS PLACEMENT (IOC);  Surgeon: Estill Cotta, MD;  Location: [facility] ORS;  Service: Ophthalmology;  Laterality: Right;  [place] 041:18 AP% 28.6 CDE 114.94 fluid pack lot # [id number]  . CORONARY ARTERY BYPASS GRAFT    . HIP ARTHROPLASTY Left 11/29/2018   Procedure: ARTHROPLASTY BIPOLAR HIP (HEMIARTHROPLASTY);  Surgeon: Corky Mull, MD;  Location: [facility] ORS;  Service: Orthopedics;  Laterality: Left;  . PERIPHERAL VASCULAR CATHETERIZATION Right 01/22/2015   Procedure: Carotid PTA/Stent Intervention;  Surgeon: Katha Cabal, MD;  Location: [place] CV LAB;  Service: Cardiovascular;  Laterality: Right;  . uterine cancer surgery       A IV Location/Drains/Wounds Patient Lines/Drains/Airways Status   Active Line/Drains/Airways    Name:   Placement date:   Placement time:   Site:   Days:   Peripheral IV 12/04/18 Right Forearm   12/04/18    1408    Forearm   1   Peripheral IV 12/04/18 Right Antecubital   12/04/18  1408    Antecubital   1   External Urinary Catheter   12/04/18    2049    -   1   Incision (Closed) 11/29/18 Hip Left   11/29/18    1720     6          Intake/Output Last 24 hours No intake or output data in the 24 hours ending 12/05/18 0139  Labs/Imaging Results for orders placed or performed during the hospital encounter of 12/04/18 (from the past 48 hour(s))  CBC with Differential     Status: Abnormal   Collection Time: 12/04/18  1:57 PM  Result Value Ref Range   WBC 6.9  4.0 - 10.5 K/uL   RBC 3.71 (L) 3.87 - 5.11 MIL/uL   Hemoglobin 10.6 (L) 12.0 - 15.0 g/dL   HCT 33.4 (L) 36.0 - 46.0 %   MCV 90.0 80.0 - 100.0 fL   MCH 28.6 26.0 - 34.0 pg   MCHC 31.7 30.0 - 36.0 g/dL   RDW 13.2 11.5 - 15.5 %   Platelets 248 150 - 400 K/uL   nRBC 0.0 0.0 - 0.2 %   Neutrophils Relative % 83 %   Neutro Abs 5.7 1.7 - 7.7 K/uL   Lymphocytes Relative 8 %   Lymphs Abs 0.5 (L) 0.7 - 4.0 K/uL   Monocytes Relative 8 %   Monocytes Absolute 0.6 0.1 - 1.0 K/uL   Eosinophils Relative 1 %   Eosinophils Absolute 0.1 0.0 - 0.5 K/uL   Basophils Relative 0 %   Basophils Absolute 0.0 0.0 - 0.1 K/uL   Immature Granulocytes 0 %   Abs Immature Granulocytes 0.02 0.00 - 0.07 K/uL    Comment: Performed at [facility], [address] [place], [place] [address]  Comprehensive metabolic panel     Status: Abnormal   Collection Time: 12/04/18  1:57 PM  Result Value Ref Range   Sodium 135 135 - 145 mmol/L   Potassium 4.6 3.5 - 5.1 mmol/L   Chloride 99 98 - 111 mmol/L   CO2 30 22 - 32 mmol/L   Glucose, Bld 146 (H) 70 - 99 mg/dL   BUN 13 8 - 23 mg/dL   Creatinine, Ser 0.56 0.44 - 1.00 mg/dL   Calcium 8.8 (L) 8.9 - 10.3 mg/dL   Total Protein 5.2 (L) 6.5 - 8.1 g/dL   Albumin 2.6 (L) 3.5 - 5.0 g/dL   AST 15 15 - 41 U/L   ALT 8 0 - 44 U/L   Alkaline Phosphatase 46 38 - 126 U/L   Total Bilirubin 0.5 0.3 - 1.2 mg/dL   GFR calc non Af Amer >60 >60 mL/min   GFR calc Af Amer >60 >60 mL/min   Anion gap 6 5 - 15    Comment: Performed at [facility], [place], [place] [address]  Procalcitonin - Baseline     Status: None   Collection Time: 12/04/18  1:57 PM  Result Value Ref Range   Procalcitonin 1.49 ng/mL    Comment:        Interpretation: PCT > 0.5 ng/mL and <= 2 ng/mL: Systemic infection (sepsis) is possible, but other conditions are known to elevate PCT as well. (NOTE)       Sepsis PCT Algorithm           Lower Respiratory Tract  Infection PCT Algorithm    ----------------------------     ----------------------------         PCT < 0.25 ng/mL                PCT < 0.10 ng/mL         Strongly encourage             Strongly discourage   discontinuation of antibiotics    initiation of antibiotics    ----------------------------     -----------------------------       PCT 0.25 - 0.50 ng/mL            PCT 0.10 - 0.25 ng/mL               OR       >80% decrease in PCT            Discourage initiation of                                            antibiotics      Encourage discontinuation           of antibiotics    ----------------------------     -----------------------------         PCT >= 0.50 ng/mL              PCT 0.26 - 0.50 ng/mL                AND       <80% decrease in PCT             Encourage initiation of                                             antibiotics       Encourage continuation           of antibiotics    ----------------------------     -----------------------------        PCT >= 0.50 ng/mL                  PCT > 0.50 ng/mL               AND         increase in PCT                  Strongly encourage                                      initiation of antibiotics    Strongly encourage escalation           of antibiotics                                     -----------------------------                                           PCT <= 0.25 ng/mL  OR                                        > 80% decrease in PCT                                     Discontinue / Do not initiate                                             antibiotics Performed at [facility], [place], [place] [address]   Troponin I (High Sensitivity)     Status: Abnormal   Collection Time: 12/04/18  1:57 PM  Result Value Ref Range   Troponin I (High Sensitivity) 25 (H) <18 ng/L    Comment: (NOTE) Elevated high sensitivity troponin I  (hsTnI) values and significant  changes across serial measurements may suggest ACS but many other  chronic and acute conditions are known to elevate hsTnI results.  Refer to the "Links" section for chest pain algorithms and additional  guidance. Performed at [facility], [address] [place], [place] [address]   Fibrin derivatives D-Dimer [facility] only)     Status: Abnormal   Collection Time: 12/04/18  1:57 PM  Result Value Ref Range   Fibrin derivatives D-dimer ([facility]) 2,457.60 (H) 0.00 - 499.00 ng/mL (FEU)    Comment: (NOTE) <> Exclusion of Venous Thromboembolism (VTE) - OUTPATIENT ONLY   (Emergency Department or [place])   0-499 ng/ml (FEU): With a low to intermediate pretest probability                      for VTE this test result excludes the diagnosis                      of VTE.   >499 ng/ml (FEU) : VTE not excluded; additional work up for VTE is                      required. <> Testing on Inpatients and Evaluation of Disseminated Intravascular   Coagulation (DIC) Reference Range:   0-499 ng/ml (FEU) Performed at [facility], [address] [place], [place] [address]   Blood gas, venous     Status: Abnormal   Collection Time: 12/04/18  1:59 PM  Result Value Ref Range   pH, Ven 7.40 7.250 - 7.430   pCO2, Ven 57 44.0 - 60.0 mmHg   pO2, Ven 52.0 (H) 32.0 - 45.0 mmHg   Bicarbonate 35.3 (H) 20.0 - 28.0 mmol/L   Acid-Base Excess 8.5 (H) 0.0 - 2.0 mmol/L   O2 Saturation 86.4 %   Patient temperature 37.0    Collection site VENOUS    Sample type VENOUS     Comment: Performed at [facility], [address] [place], [place] [address]  Brain natriuretic peptide     Status: Abnormal   Collection Time: 12/04/18  1:59 PM  Result Value Ref Range   B Natriuretic Peptide 642.0 (H) 0.0 - 100.0 pg/mL    Comment: Performed at [facility], [address] [place], [place] [address]  SARS Coronavirus 2 (CEPHEID - Performed  in [place] lab), Hosp Order     Status: None   Collection Time: 12/04/18  2:07 PM   Specimen: Nasopharyngeal Swab  Result Value Ref Range   SARS Coronavirus 2 NEGATIVE NEGATIVE    Comment: (NOTE) If result is NEGATIVE SARS-CoV-2 target nucleic acids are NOT DETECTED. The SARS-CoV-2 RNA is generally detectable in upper and lower  respiratory specimens during the acute phase of infection. The lowest  concentration of SARS-CoV-2 viral copies this assay can detect is 250  copies / mL. A negative result does not preclude SARS-CoV-2 infection  and should not be used as the sole basis for treatment or other  patient management decisions.  A negative result may occur with  improper specimen collection / handling, submission of specimen other  than nasopharyngeal swab, presence of viral mutation(s) within the  areas targeted by this assay, and inadequate number of viral copies  (<250 copies / mL). A negative result must be combined with clinical  observations, patient history, and epidemiological information. If result is POSITIVE SARS-CoV-2 target nucleic acids are DETECTED. The SARS-CoV-2 RNA is generally detectable in upper and lower  respiratory specimens dur ing the acute phase of infection.  Positive  results are indicative of active infection with SARS-CoV-2.  Clinical  correlation with patient history and other diagnostic information is  necessary to determine patient infection status.  Positive results do  not rule out bacterial infection or co-infection with other viruses. If result is PRESUMPTIVE POSTIVE SARS-CoV-2 nucleic acids MAY BE PRESENT.   A presumptive positive result was obtained on the submitted specimen  and confirmed on repeat testing.  While 2019 novel coronavirus  (SARS-CoV-2) nucleic acids may be present in the submitted sample  additional confirmatory testing may be necessary for epidemiological  and / or clinical management purposes  to differentiate between   SARS-CoV-2 and other Sarbecovirus currently known to infect humans.  If clinically indicated additional testing with an alternate test  methodology [id number]) is advised. The SARS-CoV-2 RNA is generally  detectable in upper and lower respiratory sp ecimens during the acute  phase of infection. The expected result is Negative. Fact Sheet for Patients:  StrictlyIdeas.no Fact Sheet for Healthcare Providers: BankingDealers.co.za This test is not yet approved or cleared by the [place] FDA and has been authorized for detection and/or diagnosis of SARS-CoV-2 by FDA under an Emergency Use Authorization (EUA).  This EUA will remain in effect (meaning this test can be used) for the duration of the COVID-19 declaration under Section 564(b)(1) of the Act, 21 U.S.C. section 360bbb-3(b)(1), unless the authorization is terminated or revoked sooner. Performed at [facility], [place], [place] [address]   Troponin I (High Sensitivity)     Status: Abnormal   Collection Time: 12/04/18  3:51 PM  Result Value Ref Range   Troponin I (High Sensitivity) 23 (H) <18 ng/L    Comment: (NOTE) Elevated high sensitivity troponin I (hsTnI) values and significant  changes across serial measurements may suggest ACS but many other  chronic and acute conditions are known to elevate hsTnI results.  Refer to the "Links" section for chest pain algorithms and additional  guidance. Performed at [facility], [address] [place], [place] [address]   CBC     Status: Abnormal   Collection Time: 12/04/18 11:34 PM  Result Value Ref Range   WBC 7.4 4.0 - 10.5 K/uL   RBC 3.93 3.87 - 5.11 MIL/uL   Hemoglobin 11.3 (  L) 12.0 - 15.0 g/dL   HCT 34.9 (L) 36.0 - 46.0 %   MCV 88.8 80.0 - 100.0 fL   MCH 28.8 26.0 - 34.0 pg   MCHC 32.4 30.0 - 36.0 g/dL   RDW 13.0 11.5 - 15.5 %   Platelets 219 150 - 400 K/uL   nRBC 0.0 0.0 - 0.2 %     Comment: Performed at [facility], [address] [place], [place] [address]  Creatinine, serum     Status: Abnormal   Collection Time: 12/04/18 11:34 PM  Result Value Ref Range   Creatinine, Ser 0.42 (L) 0.44 - 1.00 mg/dL   GFR calc non Af Amer >60 >60 mL/min   GFR calc Af Amer >60 >60 mL/min    Comment: Performed at [facility], [place], [place] [address]  Troponin I (High Sensitivity)     Status: Abnormal   Collection Time: 12/04/18 11:34 PM  Result Value Ref Range   Troponin I (High Sensitivity) 24 (H) <18 ng/L    Comment: (NOTE) Elevated high sensitivity troponin I (hsTnI) values and significant  changes across serial measurements may suggest ACS but many other  chronic and acute conditions are known to elevate hsTnI results.  Refer to the "Links" section for chest pain algorithms and additional  guidance. Performed at [facility], [place], [place] [address]    Ct Angio Chest Pe W And/or Wo Contrast  Result Date: 12/04/2018 CLINICAL DATA:  Shortness of breath, recent hip replacement EXAM: CT ANGIOGRAPHY CHEST WITH CONTRAST TECHNIQUE: Multidetector CT imaging of the chest was performed using the standard protocol during bolus administration of intravenous contrast. Multiplanar CT image reconstructions and MIPs were obtained to evaluate the vascular anatomy. CONTRAST:  41mL OMNIPAQUE IOHEXOL 350 MG/ML SOLN COMPARISON:  None. FINDINGS: Cardiovascular: Satisfactory opacification of the pulmonary arteries to the segmental level. No evidence of pulmonary embolism. 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 Cardiomegaly. No pericardial effusion. Mediastinum/Nodes: No enlarged mediastinal, hilar, or axillary lymph nodes. Thyroid gland, trachea, and esophagus demonstrate no significant findings.  Lungs/Pleura: Biapical pleuroparenchymal scarring. Small bilateral pleural effusions and associated atelectasis or consolidation. Upper Abdomen: No acute abnormality. Musculoskeletal: No chest wall abnormality. No acute or significant osseous findings. Review of the MIP images confirms the above findings. IMPRESSION: 1.  Negative examination for pulmonary embolism. 2. Small bilateral pleural effusions and associated atelectasis or consolidation. 3.  Cardiomegaly. 4. 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 Electronically Signed   By: Eddie Candle M.D.   On: 12/04/2018 15:46   Dg Chest Portable 1 View  Result Date: 12/04/2018 CLINICAL DATA:  Shortness of breath.  Cough. EXAM: PORTABLE CHEST 1 VIEW COMPARISON:  11/28/2018. FINDINGS: Prior CABG. Cardiomegaly. No pulmonary venous congestion. COPD cannot be excluded. No focal infiltrate. No pleural effusion or pneumothorax. Stable mild left pleural thickening noted consistent scarring. Thoracic spine scoliosis and degenerative change. IMPRESSION: 1.  Prior CABG.  Cardiomegaly.  No pulmonary venous congestion. 2. COPD cannot be excluded. No focal pulmonary infiltrate. Stable mild bilateral pleural thickening noted most likely related to scarring. Electronically Signed   By: Marcello Moores  Register   On: 12/04/2018 14:50    Pending Labs [employer] (From admission, onward)    Start     Ordered  12/11/18 0500  Creatinine, serum  (enoxaparin (LOVENOX)    CrCl >/= 30 ml/min)  Weekly,   STAT    Comments: while on enoxaparin therapy    12/04/18 2331   12/05/18 9476  Basic metabolic panel  Tomorrow morning,   STAT     12/04/18 2331   12/05/18 0500  CBC  Tomorrow morning,   STAT     12/04/18 2331   12/05/18 0500  Protime-INR  Tomorrow morning,   STAT     12/04/18 2331   12/05/18 0120  Expectorated sputum assessment w rflx to resp cult   Once,   STAT    Question:  Patient immune status  Answer:  Normal   12/05/18 0120   12/04/18 2331  Troponin I (High Sensitivity)  Now then every 6 hours,   STAT    Question:  Indication  Answer:  Suspect ACS   12/04/18 2331   12/04/18 1359  Blood culture (routine x 2)  BLOOD CULTURE X 2,   STAT     12/04/18 1359          Vitals/Pain Today's Vitals   12/04/18 2200 12/04/18 2300 12/05/18 0000 12/05/18 0100  BP: (!) 144/67 135/68 140/63 127/61  Pulse: 87 90 91 87  Resp:   12 11  Temp:      TempSrc:      SpO2: 100% 100% 100% 100%  Weight:      Height:      PainSc:        Isolation Precautions No active isolations  Medications Medications  metoprolol succinate (TOPROL-XL) 24 hr tablet 12.5 mg (12.5 mg Oral Given 12/04/18 2215)  lisinopril (ZESTRIL) tablet 5 mg (5 mg Oral Given 12/04/18 2145)  simvastatin (ZOCOR) tablet 40 mg (40 mg Oral Given 12/04/18 2145)  enoxaparin (LOVENOX) injection 40 mg (has no administration in time range)  sodium chloride flush (NS) 0.9 % injection 3 mL (has no administration in time range)  sodium chloride flush (NS) 0.9 % injection 3 mL (has no administration in time range)  0.9 %  sodium chloride infusion (has no administration in time range)  acetaminophen (TYLENOL) tablet 650 mg (has no administration in time range)    Or  acetaminophen (TYLENOL) suppository 650 mg (has no administration in time range)  polyethylene glycol (MIRALAX / GLYCOLAX) packet 17 g (has no administration in time range)  ondansetron (ZOFRAN) tablet 4 mg (has no administration in time range)    Or  ondansetron (ZOFRAN) injection 4 mg (has no administration in time range)  ipratropium-albuterol (DUONEB) 0.5-2.5 (3) MG/3ML nebulizer solution 3 mL (has no administration in time range)  ceFEPIme (MAXIPIME) 1 g in sodium chloride 0.9 % 100 mL IVPB (has no administration in time range)  azithromycin (ZITHROMAX) 500 mg in sodium chloride 0.9 % 250 mL IVPB (has no administration in  time range)  gabapentin (NEURONTIN) capsule 600 mg (600 mg Oral Given 12/04/18 2140)  ondansetron (ZOFRAN) tablet 4 mg (4 mg Oral Given 12/04/18 2140)  traMADol (ULTRAM) tablet 25 mg (25 mg Oral Given 12/04/18 2140)  iohexol (OMNIPAQUE) 350 MG/ML injection 60 mL (60 mLs Intravenous Contrast Given 12/04/18 1525)  vancomycin (VANCOCIN) IVPB 1000 mg/200 mL premix (0 mg Intravenous Stopped 12/04/18 1754)  levofloxacin (LEVAQUIN) IVPB 750 mg (0 mg Intravenous Stopped 12/04/18 1809)    Mobility non-ambulatory Moderate fall risk   Focused Assessments Pulmonary Assessment Handoff:  Lung sounds:   O2 Device: Nasal Cannula O2 Flow Rate (L/min): 2 L/min  R Recommendations: See Admitting Provider Note  Report given to:   Additional Notes:

## 2018-12-05 NOTE — Evaluation (Signed)
Physical Therapy Evaluation Patient Details Name: JERALD VILLALONA MRN: [id number] DOB: 1925/10/01 Today's Date: 12/05/2018   History of Present Illness  Admitted for respiratory failure with complaints of SOB and cough. Recent admission last week due to fall resulting in L hip hemi (post approach) on 11/29/18. Other PMH include DM, GERD, HTN, and CVA with residual expressive aphasia.   Clinical Impression  Pt is a pleasant 85 year old female who was admitted for respiratory failure with complaints of SOB symptoms. Pt on 2L of O2 upon arrival sats at 100%, removed for mobility with sats decreasing to 80% on RA, no SOB. Returned 2L of O2 with sats improving quickly to 93%. Pt performs bed mobility with min assist and transfers with mod assist. Unable to ambulate at this time due to balance. Appears to be at same baseline as previous admission and has services in place for home discharge. Pt demonstrates deficits with strength/endurance/mobility. Pt is poor historian, most of history obtained from previous admission last week. Would benefit from skilled PT to address above deficits and promote optimal return to PLOF. Recommend transition to [place] upon discharge from acute hospitalization.     Follow Up Recommendations Home health PT;Supervision/Assistance - 24 hour    Equipment Recommendations       Recommendations for Other Services       Precautions / Restrictions Precautions Precautions: Fall;Posterior Hip Precaution Booklet Issued: No Restrictions Weight Bearing Restrictions: Yes LLE Weight Bearing: Weight bearing as tolerated      Mobility  Bed Mobility Overal bed mobility: Needs Assistance Bed Mobility: Supine to Sit     Supine to sit: Min assist     General bed mobility comments: needs cues for initiating movement. Once seated at EOB, able to scoot towards edge with supervision. Upright posture noted  Transfers Overall transfer level: Needs assistance Equipment used:  Rolling walker (2 wheeled) Transfers: Sit to/from [facility] Sit to Stand: Max assist Stand pivot transfers: Mod assist       General transfer comment: stood with RW, however heavy post lean, unable to self correct or acheive upright posture. On 2nd attempt, performed SPT to recliner, able to take a few shuffle steps.  Ambulation/Gait             General Gait Details: unable  Stairs            Wheelchair Mobility    Modified Rankin (Stroke Patients Only)       Balance Overall balance assessment: Needs assistance Sitting-balance support: Feet supported Sitting balance-Leahy Scale: Fair   Postural control: Posterior lean Standing balance support: Bilateral upper extremity supported;During functional activity Standing balance-Leahy Scale: Poor                               Pertinent Vitals/Pain Pain Assessment: Faces Faces Pain Scale: Hurts a little bit Pain Location: L hip with mobility Pain Descriptors / Indicators: Guarding;Grimacing Pain Intervention(s): Limited activity within patient's tolerance    Home Living Family/patient expects to be discharged to:: Private residence Living Arrangements: Advertising account executive) Available Help at Discharge: Family;Available 24 hours/day Type of Home: House Home Access: Ramped entrance     Home Layout: One level Home Equipment: Walker - 2 wheels;Tub bench;Cane - quad;Bedside commode;Wheelchair - manual Additional Comments: does not wear O2 at home    Prior Function Level of Independence: Needs assistance         Comments: Since recent surgery, limited mobility,  transfers in/out of WC     Hand Dominance        Extremity/Trunk Assessment   Upper Extremity Assessment Upper Extremity Assessment: Generalized weakness(B UE grossly 3+/5)    Lower Extremity Assessment Lower Extremity Assessment: Generalized weakness(B LE grossly 3/5)       Communication   Communication:  Expressive difficulties  Cognition Arousal/Alertness: Awake/alert Behavior During Therapy: WFL for tasks assessed/performed Overall Cognitive Status: Within Functional Limits for tasks assessed                                        General Comments      Exercises Other Exercises Other Exercises: supine ther-ex on B LE including L (AP, SLRs, heel slides, SLRs, hip abd/add, and SAQ). On R LE, AP, SLRs, and heel slides x 10 reps with min/mod assist   Assessment/Plan    PT Assessment Patient needs continued PT services  PT Problem List Decreased strength;Decreased range of motion;Decreased activity tolerance;Decreased balance;Decreased mobility;Decreased coordination;Decreased cognition;Decreased knowledge of use of DME;Decreased safety awareness       PT Treatment Interventions DME instruction;Gait training;Balance training;Functional mobility training;Therapeutic activities;Therapeutic exercise;Patient/family education    PT Goals (Current goals can be found in the Care Plan section)  Acute Rehab PT Goals Patient Stated Goal: go home, walk again PT Goal Formulation: With patient Time For Goal Achievement: 12/19/18 Potential to Achieve Goals: Fair    Frequency Min 2X/week   Barriers to discharge        Co-evaluation               AM-PAC PT "6 Clicks" Mobility  Outcome Measure Help needed turning from your back to your side while in a flat bed without using bedrails?: A Lot Help needed moving from lying on your back to sitting on the side of a flat bed without using bedrails?: A Lot Help needed moving to and from a bed to a chair (including a wheelchair)?: A Lot Help needed standing up from a chair using your arms (e.g., wheelchair or bedside chair)?: A Lot Help needed to walk in hospital room?: Total Help needed climbing 3-5 steps with a railing? : Total 6 Click Score: 10    End of Session Equipment Utilized During Treatment: Gait  belt;Oxygen Activity Tolerance: Patient tolerated treatment well Patient left: in chair;with chair alarm set;with SCD's reapplied;with call bell/phone within reach Nurse Communication: Mobility status PT Visit Diagnosis: Unsteadiness on feet (R26.81);Difficulty in walking, not elsewhere classified (R26.2)    Time: 8299-3716 PT Time Calculation (min) (ACUTE ONLY): 26 min   Charges:   PT Evaluation $PT Eval Low Complexity: 1 Low PT Treatments $Therapeutic Exercise: 8-22 mins        Greggory Stallion, PT, DPT [phone]   Carrington Olazabal 12/05/2018, 12:21 PM

## 2018-12-06 MED ORDER — CEFDINIR 300 MG PO CAPS: 300.0000 mg | ORAL_CAPSULE | Freq: Every day | ORAL | 0 refills | Status: AC

## 2018-12-06 MED ORDER — CEFDINIR 300 MG PO CAPS: 300.0000 mg | ORAL_CAPSULE | Freq: Two times a day (BID) | ORAL | 0 refills | Status: DC

## 2018-12-06 MED ORDER — ENSURE ENLIVE PO LIQD: 237.0000 mL | Freq: Two times a day (BID) | ORAL | 12 refills | Status: AC

## 2018-12-06 MED ORDER — PNEUMOCOCCAL VAC POLYVALENT 25 MCG/0.5ML IJ INJ
0.5000 mL | INJECTION | INTRAMUSCULAR | Status: AC | PRN
  Administered 2018-12-06: 15:00:00 0.5 mL via INTRAMUSCULAR
  Filled 2018-12-06: qty 0.5

## 2018-12-06 NOTE — TOC Progression Note (Signed)
Transition of Care [facility]) - Progression Note    Patient Details  Name: Yolanda Turner MRN: [id number] Date of Birth: 18-Apr-1926  Transition of Care [facility]) CM/SW Contact  Shelbie Hutching, RN Phone Number: 12/06/2018, 2:13 PM  Clinical Narrative:     EMS transport arranged.   Expected Discharge Plan: [place] Barriers to Discharge: Barriers Resolved  Expected Discharge Plan and Services Expected Discharge Plan: [place]   Discharge Planning Services: CM Consult Post Acute Care Choice: Home Health, Durable Medical Equipment Living arrangements for the past 2 months: Single Family Home Expected Discharge Date: 12/06/18                         HH Arranged: RN, PT, OT, Nurse's Aide, Social Work [employer] Agency: [place] ([place]) Date [place]: 12/06/18 Time [place]: 1038 Representative spoke with at [place]: [place] (SDOH) Interventions    Readmission Risk Interventions No flowsheet data found.

## 2018-12-06 NOTE — TOC Transition Note (Signed)
Transition of Care [facility]) - CM/SW Discharge Note   Patient Details  Name: Yolanda Turner MRN: [id number] Date of Birth: 06-17-1925  Transition of Care [facility]) CM/SW Contact:  Shelbie Hutching, RN Phone Number: 12/06/2018, 10:38 AM   Clinical Narrative:    Patient is ready for discharge back home.  Patient will not need oxygen at discharge back to baseline RA.  Granddaughter Alyse Low notified of discharge today and patient will need transport via [employer].  Home Health has been arranged with [facility].  Granddaughter requests that patient receive pneumonia vaccine before discharge because patient is prone to pneumonia and they would rather not have to take her back to the doctor just for that purpose.  Vaccine ordered by pharmacy and to be given by bedside RN.     Final next level of care: [place] Barriers to Discharge: Barriers Resolved   Patient Goals and CMS Choice Patient states their goals for this hospitalization and ongoing recovery are:: Family would like for patient to get a pneumonia shot before she discarges CMS Medicare.gov Compare Post Acute Care list provided to:: Patient Represenative (must comment)(Christy- grandaughter) Choice offered to / list presented to : [place] / [place]  Discharge Placement                       Discharge Plan and Services   Discharge Planning Services: CM Consult Post Acute Care Choice: Home Health, Durable Medical Equipment                    HH Arranged: RN, PT, OT, Nurse's Aide, Social Work [employer] Agency: [place] ([place]) Date [place]: 12/06/18 Time [place]: 1038 Representative spoke with at [place]: [place] (SDOH) Interventions     Readmission Risk Interventions No flowsheet data found.

## 2018-12-06 NOTE — Discharge Summary (Signed)
[place] at [place] NAME: Yolanda Turner    MR#:  [id number]  DATE OF BIRTH:  1926/01/13  DATE OF ADMISSION:  12/04/2018 ADMITTING PHYSICIAN: Christel Mormon, MD  DATE OF DISCHARGE: 12/06/2018  PRIMARY CARE PHYSICIAN: Albina Billet, MD    ADMISSION DIAGNOSIS:  Acute respiratory failure with hypoxia ([place]) [J96.01] HCAP (healthcare-associated pneumonia) [J18.9]  DISCHARGE DIAGNOSIS:  Active Problems:   Respiratory failure, acute ([place])   SECONDARY DIAGNOSIS:   Past Medical History:  Diagnosis Date  . Carotid artery stenosis   . Carotid artery stenosis   . Collagen vascular disease ([place])   . Coronary artery disease   . Diabetes mellitus without complication (HCC)    diet control  . GERD (gastroesophageal reflux disease)   . Hypertension   . Myocardial infarction ([place])   . Osteoporosis   . Peripheral vascular disease ([place])   . Pneumonia   . Renal artery stenosis ([place])   . Status post double vessel coronary artery bypass 2005  . Stroke ([place])   . Uterine cancer [facility])     HOSPITAL COURSE:   85 year old female status post recent right hip repair who presented to the emergency room due to shortness of breath.  1.  Acute hypoxic respiratory failure in the setting of HCAP Her oxygen has been weaned off.  She was on cefepime and azithromycin On admission her CT chest was negative for PE. COVID-19 testing is negative. She will be discharged on oral cefdinir to complete treatment for pneumonia.   2.  Essential hypertension: She will continue metoprolol and lisinopril  3.  Hyperlipidemia: Continue statin  4.  Status post right hip repair: Patient will be discharged home with home health. 5.  History of stroke with expressive aphasia 6.  Elevated troponin: This is due to demand ischemia from pneumonia.  Troponins are flat.  Outpatient palliative care services.  DISCHARGE CONDITIONS AND DIET:   Stable for discharge regular  diet  CONSULTS OBTAINED:    DRUG ALLERGIES:   Allergies  Allergen Reactions  . Penicillins Rash    Has patient had a PCN reaction causing immediate rash, facial/tongue/throat swelling, SOB or lightheadedness with hypotension: No Has patient had a PCN reaction causing severe rash involving mucus membranes or skin necrosis: No Has patient had a PCN reaction that required hospitalization: No Has patient had a PCN reaction occurring within the last 10 years: No If all of the above answers are "NO", then may proceed with Cephalosporin use.    DISCHARGE MEDICATIONS:   Allergies as of 12/06/2018      Reactions   Penicillins Rash   Has patient had a PCN reaction causing immediate rash, facial/tongue/throat swelling, SOB or lightheadedness with hypotension: No Has patient had a PCN reaction causing severe rash involving mucus membranes or skin necrosis: No Has patient had a PCN reaction that required hospitalization: No Has patient had a PCN reaction occurring within the last 10 years: No If all of the above answers are "NO", then may proceed with Cephalosporin use.      Medication List    STOP taking these medications   oxyCODONE 5 MG immediate release tablet Commonly known as: Oxy IR/ROXICODONE     TAKE these medications   cefdinir 300 MG capsule Commonly known as: OMNICEF Take 1 capsule (300 mg total) by mouth daily for 3 days.   enoxaparin 40 MG/0.4ML injection Commonly known as: LOVENOX Inject 0.4 mLs (40 mg total) into the skin  daily for 14 days.   feeding supplement (ENSURE ENLIVE) Liqd Take 237 mLs by mouth 2 (two) times daily between meals.   gabapentin 300 MG capsule Commonly known as: NEURONTIN Take 600 mg by mouth at bedtime.   lisinopril 5 MG tablet Commonly known as: ZESTRIL Take 5 mg by mouth daily.   metoprolol succinate 25 MG 24 hr tablet Commonly known as: TOPROL-XL Take 0.5 tablets (12.5 mg total) by mouth daily. What changed: when to take this    ondansetron 4 MG tablet Commonly known as: ZOFRAN Take 1 tablet (4 mg total) by mouth every 6 (six) hours as needed for nausea. What changed: how much to take   simvastatin 40 MG tablet Commonly known as: ZOCOR Take 40 mg by mouth daily.   traMADol 50 MG tablet Commonly known as: ULTRAM Take 1 tablet (50 mg total) by mouth every 6 (six) hours as needed for moderate pain. What changed: how much to take         Today   CHIEF COMPLAINT:   No acute events overnight reported.  Patient is doing well.  She has been transitioned off of nasal cannula.   VITAL SIGNS:  Blood pressure (!) 126/50, pulse 88, temperature 99 F (37.2 C), temperature source Axillary, resp. rate 15, height 5\' 1"  (1.549 m), weight 36.5 kg, SpO2 97 %.   REVIEW OF SYSTEMS:  Review of Systems  Constitutional: Negative.  Negative for chills, fever and malaise/fatigue.  HENT: Negative.  Negative for ear discharge, ear pain, hearing loss, nosebleeds and sore throat.   Eyes: Negative.  Negative for blurred vision and pain.  Respiratory: Negative.  Negative for cough, hemoptysis, shortness of breath and wheezing.   Cardiovascular: Negative.  Negative for chest pain, palpitations and leg swelling.  Gastrointestinal: Negative.  Negative for abdominal pain, blood in stool, diarrhea, nausea and vomiting.  Genitourinary: Negative.  Negative for dysuria.  Musculoskeletal: Negative.  Negative for back pain.  Skin: Negative.   Neurological: Positive for speech change. Negative for dizziness, tremors, focal weakness, seizures and headaches.       Expressive aphasia from previous stroke  Endo/Heme/Allergies: Negative.  Does not bruise/bleed easily.  Psychiatric/Behavioral: Negative.  Negative for depression, hallucinations and suicidal ideas.     PHYSICAL EXAMINATION:  GENERAL:  85 y.o.-year-old patient lying in the bed with no acute distress.  NECK:  Supple, no jugular venous distention. No thyroid enlargement, no  tenderness.  LUNGS: Normal breath sounds bilaterally, no wheezing, rales,rhonchi  No use of accessory muscles of respiration.  CARDIOVASCULAR: S1, S2 normal. No murmurs, rubs, or gallops.  ABDOMEN: Soft, non-tender, non-distended. Bowel sounds present. No organomegaly or mass.  EXTREMITIES: No pedal edema, cyanosis, or clubbing.  PSYCHIATRIC: The patient is alert and oriented x 3.  SKIN: No obvious rash, lesion, or ulcer.   DATA REVIEW:   CBC Recent Labs  Lab 12/05/18 0433  WBC 6.7  HGB 10.4*  HCT 32.5*  PLT 245    Chemistries  Recent Labs  Lab 12/04/18 1357  12/05/18 0433  NA 135  --  138  K 4.6  --  4.5  CL 99  --  98  CO2 30  --  32  GLUCOSE 146*  --  133*  BUN 13  --  10  CREATININE 0.56   < > 0.52  CALCIUM 8.8*  --  9.2  AST 15  --   --   ALT 8  --   --   ALKPHOS 46  --   --  BILITOT 0.5  --   --    < > = values in this interval not displayed.    Cardiac Enzymes No results for input(s): TROPONINI in the last 168 hours.  Microbiology Results  @MICRORSLT48 @  RADIOLOGY:  Ct Angio Chest Pe W And/or Wo Contrast  Result Date: 12/04/2018 CLINICAL DATA:  Shortness of breath, recent hip replacement EXAM: CT ANGIOGRAPHY CHEST WITH CONTRAST TECHNIQUE: Multidetector CT imaging of the chest was performed using the standard protocol during bolus administration of intravenous contrast. Multiplanar CT image reconstructions and MIPs were obtained to evaluate the vascular anatomy. CONTRAST:  69mL OMNIPAQUE IOHEXOL 350 MG/ML SOLN COMPARISON:  None. FINDINGS: Cardiovascular: Satisfactory opacification of the pulmonary arteries to the segmental level. No evidence of pulmonary embolism. 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 Cardiomegaly. No pericardial effusion. Mediastinum/Nodes: No enlarged mediastinal, hilar, or axillary lymph nodes. Thyroid gland,  trachea, and esophagus demonstrate no significant findings. Lungs/Pleura: Biapical pleuroparenchymal scarring. Small bilateral pleural effusions and associated atelectasis or consolidation. Upper Abdomen: No acute abnormality. Musculoskeletal: No chest wall abnormality. No acute or significant osseous findings. Review of the MIP images confirms the above findings. IMPRESSION: 1.  Negative examination for pulmonary embolism. 2. Small bilateral pleural effusions and associated atelectasis or consolidation. 3.  Cardiomegaly. 4. 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 Electronically Signed   By: Eddie Candle M.D.   On: 12/04/2018 15:46   Dg Chest Portable 1 View  Result Date: 12/04/2018 CLINICAL DATA:  Shortness of breath.  Cough. EXAM: PORTABLE CHEST 1 VIEW COMPARISON:  11/28/2018. FINDINGS: Prior CABG. Cardiomegaly. No pulmonary venous congestion. COPD cannot be excluded. No focal infiltrate. No pleural effusion or pneumothorax. Stable mild left pleural thickening noted consistent scarring. Thoracic spine scoliosis and degenerative change. IMPRESSION: 1.  Prior CABG.  Cardiomegaly.  No pulmonary venous congestion. 2. COPD cannot be excluded. No focal pulmonary infiltrate. Stable mild bilateral pleural thickening noted most likely related to scarring. Electronically Signed   By: Marcello Moores  Register   On: 12/04/2018 14:50      Allergies as of 12/06/2018      Reactions   Penicillins Rash   Has patient had a PCN reaction causing immediate rash, facial/tongue/throat swelling, SOB or lightheadedness with hypotension: No Has patient had a PCN reaction causing severe rash involving mucus membranes or skin necrosis: No Has patient had a PCN reaction that required hospitalization: No Has patient had a PCN reaction occurring within the last 10 years: No If all of the above answers are "NO",  then may proceed with Cephalosporin use.      Medication List    STOP taking these medications   oxyCODONE 5 MG immediate release tablet Commonly known as: Oxy IR/ROXICODONE     TAKE these medications   cefdinir 300 MG capsule Commonly known as: OMNICEF Take 1 capsule (300 mg total) by mouth daily for 3 days.   enoxaparin 40 MG/0.4ML injection Commonly known as: LOVENOX Inject 0.4 mLs (40 mg total) into the skin daily for 14 days.   feeding supplement (ENSURE ENLIVE) Liqd Take 237 mLs by mouth 2 (two) times daily between meals.   gabapentin 300 MG capsule Commonly known as: NEURONTIN Take 600 mg by mouth at bedtime.   lisinopril 5 MG tablet Commonly known as: ZESTRIL Take 5 mg by mouth daily.  metoprolol succinate 25 MG 24 hr tablet Commonly known as: TOPROL-XL Take 0.5 tablets (12.5 mg total) by mouth daily. What changed: when to take this   ondansetron 4 MG tablet Commonly known as: ZOFRAN Take 1 tablet (4 mg total) by mouth every 6 (six) hours as needed for nausea. What changed: how much to take   simvastatin 40 MG tablet Commonly known as: ZOCOR Take 40 mg by mouth daily.   traMADol 50 MG tablet Commonly known as: ULTRAM Take 1 tablet (50 mg total) by mouth every 6 (six) hours as needed for moderate pain. What changed: how much to take          Management plans discussed with the patient and she is in agreement. Stable for discharge home with [facility] and outpatient palliative care services.  Patient should follow up with PCP  CODE STATUS:     Code Status Orders  (From admission, onward)         Start     Ordered   12/04/18 2331  Do not attempt resuscitation (DNR)  Continuous    Question Answer Comment  In the event of cardiac or respiratory ARREST Do not call a "code blue"   In the event of cardiac or respiratory ARREST Do not perform Intubation, CPR, defibrillation or ACLS   In the event of cardiac or respiratory ARREST Use medication by any  route, position, wound care, and other measures to relive pain and suffering. May use oxygen, suction and manual treatment of airway obstruction as needed for comfort.      12/04/18 2331        Code Status History    Date Active Date Inactive Code Status Order ID Comments User Context   11/28/2018 0810 12/02/2018 1550 DNR [id number]  Harrie Foreman, MD Inpatient   10/18/2018 1224 10/19/2018 1810 DNR [id number]  Henreitta Leber, MD Inpatient   10/16/2018 2345 10/18/2018 1224 Full Code [id number]  Lance Coon, MD Inpatient   05/07/2018 1349 05/09/2018 1612 Full Code [id number]  Bettey Costa, MD Inpatient   04/04/2017 1431 04/04/2017 2137 DNR [id number]  Asencion Gowda, NP Inpatient   04/02/2017 1837 04/04/2017 1430 Full Code [id number]  Vaughan Basta, MD Inpatient   01/22/2015 1036 01/22/2015 1522 Full Code [id number]  Schnier, Dolores Lory, MD Inpatient   [facility] Care Planning Activity      TOTAL TIME TAKING CARE OF THIS PATIENT: 38 minutes.    Note: This dictation was prepared with Dragon dictation along with smaller phrase technology. Any transcriptional errors that result from this process are unintentional.  Bettey Costa M.D on 12/06/2018 at 9:49 AM  Between 7am to 6pm - Pager - [phone] After 6pm go to [email or web address] - password [email or web address] [facility]  Office  [phone]  CC: Primary care physician; Albina Billet, MD

## 2018-12-09 LAB — CULTURE, BLOOD (ROUTINE X 2)
Culture: NO GROWTH
Culture: NO GROWTH
Special Requests: ADEQUATE
Special Requests: ADEQUATE

## 2019-03-29 ENCOUNTER — Encounter (INDEPENDENT_AMBULATORY_CARE_PROVIDER_SITE_OTHER): Payer: Medicare Other

## 2019-03-29 ENCOUNTER — Ambulatory Visit (INDEPENDENT_AMBULATORY_CARE_PROVIDER_SITE_OTHER): Payer: Medicare Other | Admitting: Vascular Surgery

## 2019-04-26 ENCOUNTER — Inpatient Hospital Stay
Admission: EM | Admit: 2019-04-26 | Discharge: 2019-04-27 | DRG: 193 | Disposition: A | Discharge status: Other Acute Inpatient Hospital | Payer: Medicare Other | Attending: Internal Medicine | Admitting: Internal Medicine

## 2019-04-26 ENCOUNTER — Emergency Department: Payer: Medicare Other

## 2019-04-26 ENCOUNTER — Encounter: Payer: Self-pay | Admitting: Emergency Medicine

## 2019-04-26 ENCOUNTER — Ambulatory Visit (INDEPENDENT_AMBULATORY_CARE_PROVIDER_SITE_OTHER): Payer: Medicare Other | Admitting: Vascular Surgery

## 2019-04-26 ENCOUNTER — Encounter (INDEPENDENT_AMBULATORY_CARE_PROVIDER_SITE_OTHER): Payer: Medicare Other

## 2019-04-26 ENCOUNTER — Other Ambulatory Visit: Payer: Self-pay

## 2019-04-26 DIAGNOSIS — I2510 Atherosclerotic heart disease of native coronary artery without angina pectoris: Secondary | ICD-10-CM | POA: Diagnosis present

## 2019-04-26 DIAGNOSIS — I701 Atherosclerosis of renal artery: Secondary | ICD-10-CM | POA: Diagnosis present

## 2019-04-26 DIAGNOSIS — R0902 Hypoxemia: Secondary | ICD-10-CM | POA: Diagnosis not present

## 2019-04-26 DIAGNOSIS — G629 Polyneuropathy, unspecified: Secondary | ICD-10-CM | POA: Diagnosis present

## 2019-04-26 DIAGNOSIS — I248 Other forms of acute ischemic heart disease: Secondary | ICD-10-CM | POA: Diagnosis present

## 2019-04-26 DIAGNOSIS — Z8542 Personal history of malignant neoplasm of other parts of uterus: Secondary | ICD-10-CM

## 2019-04-26 DIAGNOSIS — R52 Pain, unspecified: Secondary | ICD-10-CM | POA: Diagnosis not present

## 2019-04-26 DIAGNOSIS — J9601 Acute respiratory failure with hypoxia: Secondary | ICD-10-CM | POA: Diagnosis present

## 2019-04-26 DIAGNOSIS — Z801 Family history of malignant neoplasm of trachea, bronchus and lung: Secondary | ICD-10-CM

## 2019-04-26 DIAGNOSIS — Z951 Presence of aortocoronary bypass graft: Secondary | ICD-10-CM

## 2019-04-26 DIAGNOSIS — Z66 Do not resuscitate: Secondary | ICD-10-CM | POA: Diagnosis present

## 2019-04-26 DIAGNOSIS — M25552 Pain in left hip: Secondary | ICD-10-CM | POA: Diagnosis present

## 2019-04-26 DIAGNOSIS — L97429 Non-pressure chronic ulcer of left heel and midfoot with unspecified severity: Secondary | ICD-10-CM | POA: Diagnosis present

## 2019-04-26 DIAGNOSIS — I700 Atherosclerosis of aorta: Secondary | ICD-10-CM | POA: Diagnosis present

## 2019-04-26 DIAGNOSIS — Z90710 Acquired absence of both cervix and uterus: Secondary | ICD-10-CM

## 2019-04-26 DIAGNOSIS — L89620 Pressure ulcer of left heel, unstageable: Secondary | ICD-10-CM | POA: Diagnosis not present

## 2019-04-26 DIAGNOSIS — E785 Hyperlipidemia, unspecified: Secondary | ICD-10-CM | POA: Diagnosis present

## 2019-04-26 DIAGNOSIS — J129 Viral pneumonia, unspecified: Principal | ICD-10-CM | POA: Diagnosis present

## 2019-04-26 DIAGNOSIS — Z20828 Contact with and (suspected) exposure to other viral communicable diseases: Secondary | ICD-10-CM | POA: Diagnosis present

## 2019-04-26 DIAGNOSIS — K5730 Diverticulosis of large intestine without perforation or abscess without bleeding: Secondary | ICD-10-CM | POA: Diagnosis present

## 2019-04-26 DIAGNOSIS — E1151 Type 2 diabetes mellitus with diabetic peripheral angiopathy without gangrene: Secondary | ICD-10-CM | POA: Diagnosis present

## 2019-04-26 DIAGNOSIS — F0390 Unspecified dementia without behavioral disturbance: Secondary | ICD-10-CM | POA: Diagnosis present

## 2019-04-26 DIAGNOSIS — I160 Hypertensive urgency: Secondary | ICD-10-CM | POA: Diagnosis present

## 2019-04-26 DIAGNOSIS — Z880 Allergy status to penicillin: Secondary | ICD-10-CM

## 2019-04-26 DIAGNOSIS — I110 Hypertensive heart disease with heart failure: Secondary | ICD-10-CM | POA: Diagnosis present

## 2019-04-26 DIAGNOSIS — M810 Age-related osteoporosis without current pathological fracture: Secondary | ICD-10-CM | POA: Diagnosis present

## 2019-04-26 DIAGNOSIS — J189 Pneumonia, unspecified organism: Secondary | ICD-10-CM

## 2019-04-26 DIAGNOSIS — J440 Chronic obstructive pulmonary disease with acute lower respiratory infection: Secondary | ICD-10-CM | POA: Diagnosis present

## 2019-04-26 DIAGNOSIS — M79605 Pain in left leg: Secondary | ICD-10-CM

## 2019-04-26 DIAGNOSIS — L8990 Pressure ulcer of unspecified site, unspecified stage: Secondary | ICD-10-CM | POA: Insufficient documentation

## 2019-04-26 DIAGNOSIS — E11621 Type 2 diabetes mellitus with foot ulcer: Secondary | ICD-10-CM | POA: Diagnosis present

## 2019-04-26 DIAGNOSIS — I5022 Chronic systolic (congestive) heart failure: Secondary | ICD-10-CM | POA: Diagnosis present

## 2019-04-26 DIAGNOSIS — Z96642 Presence of left artificial hip joint: Secondary | ICD-10-CM | POA: Diagnosis present

## 2019-04-26 DIAGNOSIS — R918 Other nonspecific abnormal finding of lung field: Secondary | ICD-10-CM | POA: Diagnosis not present

## 2019-04-26 DIAGNOSIS — Z7902 Long term (current) use of antithrombotics/antiplatelets: Secondary | ICD-10-CM

## 2019-04-26 DIAGNOSIS — K450 Other specified abdominal hernia with obstruction, without gangrene: Secondary | ICD-10-CM | POA: Diagnosis present

## 2019-04-26 DIAGNOSIS — I252 Old myocardial infarction: Secondary | ICD-10-CM | POA: Diagnosis not present

## 2019-04-26 DIAGNOSIS — K56609 Unspecified intestinal obstruction, unspecified as to partial versus complete obstruction: Secondary | ICD-10-CM

## 2019-04-26 DIAGNOSIS — Z8673 Personal history of transient ischemic attack (TIA), and cerebral infarction without residual deficits: Secondary | ICD-10-CM

## 2019-04-26 DIAGNOSIS — K219 Gastro-esophageal reflux disease without esophagitis: Secondary | ICD-10-CM | POA: Diagnosis present

## 2019-04-26 DIAGNOSIS — Z87891 Personal history of nicotine dependence: Secondary | ICD-10-CM

## 2019-04-26 DIAGNOSIS — Z79899 Other long term (current) drug therapy: Secondary | ICD-10-CM

## 2019-04-26 LAB — CBC WITH DIFFERENTIAL/PLATELET
Abs Immature Granulocytes: 0.06 10*3/uL (ref 0.00–0.07)
Basophils Absolute: 0.1 10*3/uL (ref 0.0–0.1)
Basophils Relative: 1 %
Eosinophils Absolute: 0 10*3/uL (ref 0.0–0.5)
Eosinophils Relative: 0 %
HCT: 36.6 % (ref 36.0–46.0)
Hemoglobin: 11.5 g/dL — ABNORMAL LOW (ref 12.0–15.0)
Immature Granulocytes: 1 %
Lymphocytes Relative: 8 %
Lymphs Abs: 0.7 10*3/uL (ref 0.7–4.0)
MCH: 28.3 pg (ref 26.0–34.0)
MCHC: 31.4 g/dL (ref 30.0–36.0)
MCV: 89.9 fL (ref 80.0–100.0)
Monocytes Absolute: 0.4 10*3/uL (ref 0.1–1.0)
Monocytes Relative: 4 %
Neutro Abs: 7.7 10*3/uL (ref 1.7–7.7)
Neutrophils Relative %: 86 %
Platelets: 219 10*3/uL (ref 150–400)
RBC: 4.07 MIL/uL (ref 3.87–5.11)
RDW: 14.8 % (ref 11.5–15.5)
WBC: 8.9 10*3/uL (ref 4.0–10.5)
nRBC: 0 % (ref 0.0–0.2)

## 2019-04-26 LAB — COMPREHENSIVE METABOLIC PANEL
ALT: 13 U/L (ref 0–44)
AST: 21 U/L (ref 15–41)
Albumin: 3.8 g/dL (ref 3.5–5.0)
Alkaline Phosphatase: 61 U/L (ref 38–126)
Anion gap: 12 (ref 5–15)
BUN: 14 mg/dL (ref 8–23)
CO2: 25 mmol/L (ref 22–32)
Calcium: 9.5 mg/dL (ref 8.9–10.3)
Chloride: 104 mmol/L (ref 98–111)
Creatinine, Ser: 0.78 mg/dL (ref 0.44–1.00)
GFR calc Af Amer: >60 mL/min (ref >60)
GFR calc non Af Amer: >60 mL/min (ref >60)
Glucose, Bld: 188 mg/dL — ABNORMAL HIGH (ref 70–99)
Potassium: 3.8 mmol/L (ref 3.5–5.1)
Sodium: 141 mmol/L (ref 135–145)
Total Bilirubin: 0.6 mg/dL (ref 0.3–1.2)
Total Protein: 6.5 g/dL (ref 6.5–8.1)

## 2019-04-26 LAB — BRAIN NATRIURETIC PEPTIDE: B Natriuretic Peptide: 17 pg/mL (ref 0.0–100.0)

## 2019-04-26 LAB — TROPONIN I (HIGH SENSITIVITY): Troponin I (High Sensitivity): 32 ng/L — ABNORMAL HIGH (ref <18)

## 2019-04-26 LAB — LACTIC ACID, PLASMA: Lactic Acid, Venous: 1.4 mmol/L (ref 0.5–1.9)

## 2019-04-26 MED ORDER — ACETAMINOPHEN 650 MG RE SUPP: 650.0000 mg | Freq: Four times a day (QID) | RECTAL | Status: DC | PRN

## 2019-04-26 MED ORDER — ONDANSETRON HCL 4 MG/2ML IJ SOLN
4.0000 mg | Freq: Once | INTRAMUSCULAR | Status: AC
  Administered 2019-04-26: 4 mg via INTRAVENOUS
  Filled 2019-04-26: qty 2

## 2019-04-26 MED ORDER — ACETAMINOPHEN 325 MG PO TABS: 650.0000 mg | ORAL_TABLET | Freq: Once | ORAL | Status: DC

## 2019-04-26 MED ORDER — IOHEXOL 300 MG/ML  SOLN
75.0000 mL | Freq: Once | INTRAMUSCULAR | Status: AC | PRN
  Administered 2019-04-26: 75 mL via INTRAVENOUS

## 2019-04-26 MED ORDER — LISINOPRIL 5 MG PO TABS: 5.0000 mg | ORAL_TABLET | Freq: Every day | ORAL | Status: DC

## 2019-04-26 MED ORDER — ONDANSETRON HCL 4 MG/2ML IJ SOLN
4.0000 mg | Freq: Four times a day (QID) | INTRAMUSCULAR | Status: DC | PRN
  Administered 2019-04-27: 4 mg via INTRAVENOUS
  Filled 2019-04-26: qty 2

## 2019-04-26 MED ORDER — ACETAMINOPHEN 325 MG PO TABS: 650.0000 mg | ORAL_TABLET | Freq: Four times a day (QID) | ORAL | Status: DC | PRN

## 2019-04-26 MED ORDER — ONDANSETRON HCL 4 MG/2ML IJ SOLN
4.0000 mg | Freq: Once | INTRAMUSCULAR | Status: AC
  Administered 2019-04-26: 4 mg via INTRAVENOUS

## 2019-04-26 MED ORDER — GABAPENTIN 300 MG PO CAPS: 600.0000 mg | ORAL_CAPSULE | Freq: Every day | ORAL | Status: DC

## 2019-04-26 MED ORDER — LABETALOL HCL 5 MG/ML IV SOLN
20.0000 mg | INTRAVENOUS | Status: DC | PRN
  Filled 2019-04-26: qty 4

## 2019-04-26 MED ORDER — SIMVASTATIN 20 MG PO TABS: 40.0000 mg | ORAL_TABLET | Freq: Every day | ORAL | Status: DC

## 2019-04-26 MED ORDER — ONDANSETRON HCL 4 MG/2ML IJ SOLN
INTRAMUSCULAR | Status: AC
  Filled 2019-04-26: qty 2

## 2019-04-26 MED ORDER — SODIUM CHLORIDE 0.9 % IV SOLN
INTRAVENOUS | Status: DC
  Administered 2019-04-27: 01:00:00 via INTRAVENOUS

## 2019-04-26 MED ORDER — IOHEXOL 9 MG/ML PO SOLN
1000.0000 mL | Freq: Once | ORAL | Status: AC | PRN
  Administered 2019-04-26: 1000 mL via ORAL

## 2019-04-26 MED ORDER — METOPROLOL SUCCINATE ER 25 MG PO TB24: 12.5000 mg | ORAL_TABLET | Freq: Every evening | ORAL | Status: DC

## 2019-04-26 MED ORDER — FUROSEMIDE 20 MG PO TABS
20.0000 mg | ORAL_TABLET | Freq: Every day | ORAL | Status: DC
  Filled 2019-04-26: qty 1

## 2019-04-26 MED ORDER — CLOPIDOGREL BISULFATE 75 MG PO TABS
75.0000 mg | ORAL_TABLET | Freq: Every day | ORAL | Status: DC
  Filled 2019-04-26: qty 1

## 2019-04-26 MED ORDER — ENOXAPARIN SODIUM 30 MG/0.3ML ~~LOC~~ SOLN
30.0000 mg | SUBCUTANEOUS | Status: DC
  Filled 2019-04-26 (×2): qty 0.3

## 2019-04-26 MED ORDER — ONDANSETRON HCL 4 MG PO TABS
4.0000 mg | ORAL_TABLET | Freq: Four times a day (QID) | ORAL | Status: DC | PRN
  Filled 2019-04-26: qty 1

## 2019-04-26 MED ORDER — SODIUM CHLORIDE 0.9 % IV SOLN
2.0000 g | INTRAVENOUS | Status: DC
  Administered 2019-04-27: 2 g via INTRAVENOUS
  Filled 2019-04-26: qty 2
  Filled 2019-04-26: qty 20

## 2019-04-26 MED ORDER — TRAZODONE HCL 50 MG PO TABS
25.0000 mg | ORAL_TABLET | Freq: Every evening | ORAL | Status: DC | PRN
  Filled 2019-04-26: qty 0.5

## 2019-04-26 MED ORDER — MAGNESIUM HYDROXIDE 400 MG/5ML PO SUSP: 30.0000 mL | Freq: Every day | ORAL | Status: DC | PRN

## 2019-04-26 MED ORDER — SODIUM CHLORIDE 0.9 % IV SOLN
500.0000 mg | INTRAVENOUS | Status: DC
  Administered 2019-04-27: 500 mg via INTRAVENOUS
  Filled 2019-04-26 (×2): qty 500

## 2019-04-26 MED ORDER — ENSURE ENLIVE PO LIQD: 237.0000 mL | Freq: Two times a day (BID) | ORAL | Status: DC

## 2019-04-26 NOTE — ED Notes (Signed)
Report given to Annie RN 

## 2019-04-26 NOTE — ED Provider Notes (Signed)
[facility] Emergency Department Provider Note   ____________________________________________   First MD Initiated Contact with Patient 04/26/19 1829     (approximate)  I have reviewed the triage vital signs and the nursing notes.   HISTORY  Chief Complaint Leg Pain   HPI ELROSE COLACINO is a 85 y.o. female who complains of left leg pain starting today.  She is uncomfortable lying in the bed.  She is also hypoxic with an O2 sat of 78% on room air.  Goes up to 92% on oxygen.  She is not coughing does not complain of shortness of breath.  She does not have any fever or belly pain or chest pain only pain in the left leg.  Pain appears to be deep and achy.  Leg itself is warm        Past Medical History:  Diagnosis Date   Carotid artery stenosis    Carotid artery stenosis    Collagen vascular disease (HCC)    Coronary artery disease    Diabetes mellitus without complication (HCC)    diet control   GERD (gastroesophageal reflux disease)    Hypertension    Myocardial infarction ([place])    Osteoporosis    Peripheral vascular disease (HCC)    Pneumonia    Renal artery stenosis (HCC)    Status post double vessel coronary artery bypass 2005   Stroke [facility])    Uterine cancer [facility])     Patient Active Problem List   Diagnosis Date Noted   Hypoxia 04/26/2019   Respiratory failure, acute ([place]) 12/04/2018   Hip fracture ([place]) 11/28/2018   Goals of care, counseling/discussion    Palliative care by specialist    GERD (gastroesophageal reflux disease) 10/16/2018   CAD (coronary artery disease) 10/16/2018   Atrial fibrillation with RVR ([place]) 10/16/2018   CVA (cerebral vascular accident) ([place]) 05/07/2018   Hypertension 02/02/2018   Diabetes mellitus without complication ([place]) 99991111   Carotid artery stenosis 02/02/2018   Acute CVA (cerebrovascular accident) ([place]) 04/04/2017   Speech problem 04/02/2017   Confusion 04/02/2017    TIA (transient ischemic attack) 04/02/2017    Past Surgical History:  Procedure Laterality Date   ABDOMINAL HYSTERECTOMY     CAROTID ENDARTERECTOMY     CATARACT EXTRACTION W/PHACO Right 09/28/2015   Procedure: CATARACT EXTRACTION PHACO AND INTRAOCULAR LENS PLACEMENT ([place]);  Surgeon: Estill Cotta, MD;  Location: [facility] ORS;  Service: Ophthalmology;  Laterality: Right;  [place] 041:18 AP% 28.6 CDE 114.94 fluid pack lot # [id number]   CORONARY ARTERY BYPASS GRAFT     HIP ARTHROPLASTY Left 11/29/2018   Procedure: ARTHROPLASTY BIPOLAR HIP (HEMIARTHROPLASTY);  Surgeon: Corky Mull, MD;  Location: [facility] ORS;  Service: Orthopedics;  Laterality: Left;   PERIPHERAL VASCULAR CATHETERIZATION Right 01/22/2015   Procedure: Carotid PTA/Stent Intervention;  Surgeon: Katha Cabal, MD;  Location: [place] CV LAB;  Service: Cardiovascular;  Laterality: Right;   uterine cancer surgery      Prior to Admission medications   Medication Sig Start Date End Date Taking? Authorizing Provider  clopidogrel (PLAVIX) 75 MG tablet Take 75 mg by mouth daily. 04/15/19  Yes [provider]  furosemide (LASIX) 20 MG tablet Take 20 mg by mouth daily. 04/20/19  Yes [provider]  gabapentin (NEURONTIN) 300 MG capsule Take 600 mg by mouth at bedtime.    Yes [provider]  lisinopril (ZESTRIL) 5 MG tablet Take 5 mg by mouth daily.   Yes [provider]  metoprolol succinate (TOPROL-XL) 25 MG 24 hr tablet Take 0.5 tablets (12.5 mg total) by mouth daily. Patient taking differently: Take 12.5 mg by mouth every evening.  10/19/18 04/26/19 Yes Sainani, Belia Heman, MD  simvastatin (ZOCOR) 40 MG tablet Take 40 mg by mouth daily.    Yes [provider]  feeding supplement, ENSURE ENLIVE, (ENSURE ENLIVE) LIQD Take 237 mLs by mouth 2 (two) times daily between meals. 12/06/18   Bettey Costa, MD    Allergies Penicillins  Family History  Problem Relation Age of Onset   Lung  cancer Daughter     Social History Social History   Tobacco Use   Smoking status: Former Smoker   Smokeless tobacco: Never Used  Substance Use Topics   Alcohol use: No   Drug use: No    Review of Systems  Constitutional: No fever/chills Eyes: No visual changes. ENT: No sore throat. Cardiovascular: Denies chest pain. Respiratory: Denies shortness of breath. Gastrointestinal: No abdominal pain.  No nausea, no vomiting.  No diarrhea.  No constipation. Genitourinary: Negative for dysuria. Musculoskeletal: Negative for back pain. Skin: Negative for rash. Neurological: Negative for headaches, focal weakness or  ____________________________________________   PHYSICAL EXAM:  VITAL SIGNS: ED Triage Vitals  Enc Vitals Group     BP 04/26/19 1817 (!) 169/82     Pulse Rate 04/26/19 1817 96     Resp 04/26/19 1817 (!) 24     Temp 04/26/19 1817 (!) 96.1 F (35.6 C)     Temp Source 04/26/19 1817 Axillary     SpO2 04/26/19 1817 (!) 78 %     Weight 04/26/19 1814 70 lb (31.8 kg)     Height 04/26/19 1814 4\' 9"  (1.448 m)     Head Circumference --      Peak Flow --      Pain Score --      Pain Loc --      Pain Edu? --      Excl. in [place]? --     Constitutional: Alert and oriented.  Appears uncomfortable Eyes: Conjunctivae are normal.  Head: Atraumatic. Nose: No congestion/rhinnorhea. Mouth/Throat: Mucous membranes are moist.  Oropharynx non-erythematous. Neck: No stridor.   Cardiovascular: Normal rate, regular rhythm. Grossly normal heart sounds.  Good peripheral circulation. Respiratory: Normal respiratory effort.  No retractions. Lungs CTAB. Gastrointestinal: Soft and nontender. No distention. No abdominal bruits. No CVA tenderness. Musculoskeletal: No lower extremity tenderness nor edema.  Patient does however complain of pain in the left leg and is rubbing it.  The pain appears to be mostly in the upper thigh. Neurologic:  Normal speech and language. No gross focal  neurologic deficits are appreciated. No gait instability. Skin:  Skin is warm, dry and intact. No rash noted.   ____________________________________________   LABS (all labs ordered are listed, but only abnormal results are displayed)  Labs Reviewed  CBC WITH DIFFERENTIAL/PLATELET - Abnormal; Notable for the following components:      Result Value   Hemoglobin 11.5 (*)    All other components within normal limits  COMPREHENSIVE METABOLIC PANEL - Abnormal; Notable for the following components:   Glucose, Bld 188 (*)    All other components within normal limits  TROPONIN I (HIGH SENSITIVITY) - Abnormal; Notable for the following components:   Troponin I (High Sensitivity) 32 (*)    All other components within normal limits  SARS CORONAVIRUS 2 (TAT 6-24 HRS)  BRAIN NATRIURETIC PEPTIDE  LACTIC ACID, PLASMA  LACTIC ACID, PLASMA  C-REACTIVE  PROTEIN  FERRITIN  LACTATE DEHYDROGENASE  FIBRIN DERIVATIVES D-DIMER ([facility] ONLY)  TROPONIN I (HIGH SENSITIVITY)   ____________________________________________  EKG   ____________________________________________  RADIOLOGY  ED MD interpretation:    Official radiology report(s): Dg Chest 1 View  Result Date: 04/26/2019 CLINICAL DATA:  Left hip pain and leg pain. EXAM: CHEST  1 VIEW COMPARISON:  Chest x-ray 12/04/2018 FINDINGS: Heart size remains enlarged following median sternotomy with signs of aortic atherosclerosis. Increased interstitial markings bilaterally with more pronounced opacity in the left chest, left upper lobe. No signs of pleural effusion. Lungs are less hyperinflated than before. No acute bone finding. IMPRESSION: Findings that are suspicious for atypical pneumonia versus asymmetric pulmonary edema. Cardiomegaly and changes of sternotomy as before. COPD Electronically Signed   By: Zetta Bills M.D.   On: 04/26/2019 19:34   Ct Chest W Contrast  Result Date: 04/26/2019 CLINICAL DATA:  Interstitial lung disease. New  infiltrates on chest x-ray. Abdominal distension with concern for bowel obstruction. EXAM: CT CHEST, ABDOMEN, AND PELVIS WITH CONTRAST TECHNIQUE: Multidetector CT imaging of the chest, abdomen and pelvis was performed following the standard protocol during bolus administration of intravenous contrast. CONTRAST:  48mL OMNIPAQUE IOHEXOL 300 MG/ML  SOLN COMPARISON:  CT chest dated December 04, 2018. CT of the pelvis dated 05/24/2013 FINDINGS: CT CHEST FINDINGS Cardiovascular: There is no evidence for large centrally located pulmonary embolism. Detection of smaller segmental and subsegmental pulmonary emboli is limited by technique and contrast bolus timing. Advanced aortic calcifications are noted. The main pulmonary artery is dilated. The main right and left pulmonary arteries are dilated. The heart is not significantly enlarged. There is no pericardial effusion. Mediastinum/Nodes: --No mediastinal or hilar lymphadenopathy. --No axillary lymphadenopathy. --No supraclavicular lymphadenopathy. --Normal thyroid gland. --The esophagus is unremarkable Lungs/Pleura: There are multifocal areas of consolidation, greatest within the left upper and bilateral lower lobes. There is scarring versus atelectasis at the lung bases. There is no pneumothorax. There are trace bilateral pleural effusions. Musculoskeletal: No chest wall abnormality. No acute or significant osseous findings. CT ABDOMEN PELVIS FINDINGS Hepatobiliary: The liver is normal. Normal gallbladder.There is no biliary ductal dilation. Pancreas: Normal contours without ductal dilatation. No peripancreatic fluid collection. Spleen: No splenic laceration or hematoma. Adrenals/Urinary Tract: --Adrenal glands: No adrenal hemorrhage. --Right kidney/ureter: No hydronephrosis or perinephric hematoma. --Left kidney/ureter: No hydronephrosis or perinephric hematoma. --Urinary bladder: The urinary bladder is moderately distended. Stomach/Bowel: --Stomach/Duodenum: The stomach is  distended with an air-fluid level. --Small bowel: There are dilated loops of small bowel in the low midline abdomen and pelvis measuring up to approximately 3.8 cm in diameter. This appears to be secondary to an obturator hernia through the left obturator foramen. --Colon: Rectosigmoid diverticulosis without acute inflammation. --Appendix: Not visualized. No right lower quadrant inflammation or free fluid. Vascular/Lymphatic: Atherosclerotic calcification is present within the non-aneurysmal abdominal aorta, without hemodynamically significant stenosis. --No retroperitoneal lymphadenopathy. --No mesenteric lymphadenopathy. --No pelvic or inguinal lymphadenopathy. Reproductive: Status post hysterectomy. No adnexal mass. Other: There is a small amount of free fluid in the patient's abdomen and pelvis. There is no significant free air. Musculoskeletal. No acute displaced fractures. IMPRESSION: 1. Small-bowel obstruction likely secondary to an obturator hernia through the left obturator foramen. 2. Multifocal pneumonia (viral or bacterial), greatest within the left upper lobe. 3. Trace bilateral pleural effusions. 4. Moderately distended urinary bladder. 5. Sigmoid diverticulosis without CT evidence for diverticulitis. 6. Aortic atherosclerosis. Aortic Atherosclerosis (ICD10-I70.0). Electronically Signed   By: Constance Holster M.D.   On: 04/26/2019 23:34  Ct Abdomen Pelvis W Contrast  Result Date: 04/26/2019 CLINICAL DATA:  Interstitial lung disease. New infiltrates on chest x-ray. Abdominal distension with concern for bowel obstruction. EXAM: CT CHEST, ABDOMEN, AND PELVIS WITH CONTRAST TECHNIQUE: Multidetector CT imaging of the chest, abdomen and pelvis was performed following the standard protocol during bolus administration of intravenous contrast. CONTRAST:  59mL OMNIPAQUE IOHEXOL 300 MG/ML  SOLN COMPARISON:  CT chest dated December 04, 2018. CT of the pelvis dated 05/24/2013 FINDINGS: CT CHEST FINDINGS  Cardiovascular: There is no evidence for large centrally located pulmonary embolism. Detection of smaller segmental and subsegmental pulmonary emboli is limited by technique and contrast bolus timing. Advanced aortic calcifications are noted. The main pulmonary artery is dilated. The main right and left pulmonary arteries are dilated. The heart is not significantly enlarged. There is no pericardial effusion. Mediastinum/Nodes: --No mediastinal or hilar lymphadenopathy. --No axillary lymphadenopathy. --No supraclavicular lymphadenopathy. --Normal thyroid gland. --The esophagus is unremarkable Lungs/Pleura: There are multifocal areas of consolidation, greatest within the left upper and bilateral lower lobes. There is scarring versus atelectasis at the lung bases. There is no pneumothorax. There are trace bilateral pleural effusions. Musculoskeletal: No chest wall abnormality. No acute or significant osseous findings. CT ABDOMEN PELVIS FINDINGS Hepatobiliary: The liver is normal. Normal gallbladder.There is no biliary ductal dilation. Pancreas: Normal contours without ductal dilatation. No peripancreatic fluid collection. Spleen: No splenic laceration or hematoma. Adrenals/Urinary Tract: --Adrenal glands: No adrenal hemorrhage. --Right kidney/ureter: No hydronephrosis or perinephric hematoma. --Left kidney/ureter: No hydronephrosis or perinephric hematoma. --Urinary bladder: The urinary bladder is moderately distended. Stomach/Bowel: --Stomach/Duodenum: The stomach is distended with an air-fluid level. --Small bowel: There are dilated loops of small bowel in the low midline abdomen and pelvis measuring up to approximately 3.8 cm in diameter. This appears to be secondary to an obturator hernia through the left obturator foramen. --Colon: Rectosigmoid diverticulosis without acute inflammation. --Appendix: Not visualized. No right lower quadrant inflammation or free fluid. Vascular/Lymphatic: Atherosclerotic calcification  is present within the non-aneurysmal abdominal aorta, without hemodynamically significant stenosis. --No retroperitoneal lymphadenopathy. --No mesenteric lymphadenopathy. --No pelvic or inguinal lymphadenopathy. Reproductive: Status post hysterectomy. No adnexal mass. Other: There is a small amount of free fluid in the patient's abdomen and pelvis. There is no significant free air. Musculoskeletal. No acute displaced fractures. IMPRESSION: 1. Small-bowel obstruction likely secondary to an obturator hernia through the left obturator foramen. 2. Multifocal pneumonia (viral or bacterial), greatest within the left upper lobe. 3. Trace bilateral pleural effusions. 4. Moderately distended urinary bladder. 5. Sigmoid diverticulosis without CT evidence for diverticulitis. 6. Aortic atherosclerosis. Aortic Atherosclerosis (ICD10-I70.0). Electronically Signed   By: Constance Holster M.D.   On: 04/26/2019 23:34   Dg Abd Portable 2 Views  Result Date: 04/26/2019 CLINICAL DATA:  Dilated bowel for further workup EXAM: PORTABLE ABDOMEN - 2 VIEW COMPARISON:  Hip radiographs 04/26/2019 FINDINGS: Again we demonstrate a dilated loop of small bowel in the right lower quadrant, measuring about 4.1 cm in diameter. There is formed stool in the colon. The upright abdomen radiograph excludes the pelvis, the dilated loop is seen but month certain if there are associated air-fluid levels because the pelvis is excluded. Aortic and splenic artery atherosclerosis. Small vessel stent in the abdomen, possibly of the left renal artery. Left hip hemiarthroplasty. Bony demineralization. The upright view includes some of the chest and demonstrates indistinct left perihilar airspace opacity as well as bilateral interstitial opacity in the lungs. Prior CABG noted. IMPRESSION: 1. Abnormally dilated loop of small bowel in  the right lower quadrant, similar to prior appearance the. The appearance is abnormal but nonspecific and could represent local  ileus or early obstruction. However, there is formed stool in the colon. 2. Airspace opacity in the left perihilar region with bilateral interstitial accentuation, possibilities include asymmetric edema or pneumonia. 3. Mild enlargement of the cardiopericardial silhouette 4. Atherosclerosis. 5. Bony demineralization. Electronically Signed   By: Van Clines M.D.   On: 04/26/2019 20:13   Dg Hip Unilat W Or Wo Pelvis 2-3 Views Left  Result Date: 04/26/2019 CLINICAL DATA:  Left hip and leg pain EXAM: DG HIP (WITH OR WITHOUT PELVIS) 2-3V LEFT COMPARISON:  Femoral exam of the same date, postoperative exam of 11/29/2018 FINDINGS: Signs of left hip arthroplasty. No signs of periprosthetic fracture. Osteopenia with minimal degenerative changes in the right hip. No signs of pelvic fracture with evidence of calcified atherosclerotic changes overlying the pelvis. Focal bowel dilation in the right lower quadrant, small bowel loops up to 4 cm are incidentally noted. There is evidence of some stool and gas in the rectum. IMPRESSION: 1. Left hip arthroplasty without signs of acute fracture or dislocation. 2. Focal bowel dilation in the right lower quadrant, small bowel loops up to 4 cm are incidentally noted, consider dedicated abdominal plain film to exclude ileus versus early small bowel obstruction. 3. Signs of calcified atherosclerotic changes in the pelvis. Electronically Signed   By: Zetta Bills M.D.   On: 04/26/2019 19:37   Dg Femur Min 2 Views Left  Result Date: 04/26/2019 CLINICAL DATA:  Leg pain, left leg. EXAM: LEFT FEMUR 2 VIEWS COMPARISON:  11/29/2018 FINDINGS: Post left hip arthroplasty without signs of periprosthetic fracture. Signs of vascular calcification along superficial femoral artery course. Osteopenia. IMPRESSION: Post left hip arthroplasty. No acute abnormality. Electronically Signed   By: Zetta Bills M.D.   On: 04/26/2019 19:30     ____________________________________________   PROCEDURES  Procedure(s) performed (including Critical Care): Critical care time 45 minutes.  This includes evaluating the patient reviewing her x-rays reviewing her second set of x-rays and CT scans speaking with the patient's daughter and then the hospitalist.  Procedures   ____________________________________________   INITIAL IMPRESSION / ASSESSMENT AND PLAN / ED COURSE  Patient is hypoxic and is oxygen dependent.  She is not running a fever but has a marked infiltrate on chest x-ray and CT scan.  We will have to get her into the hospital.  Additionally there is dilated loops of bowel on the CT of the abdomen which will have to be further evaluated.  I am not sure why her leg is hurting.  There does not appear to be any sciatica that I can find.  Strength patient is able to bend her legs without any difficulty there is no evidence of cellulitis or rash or coldness or pallor either.              ____________________________________________   FINAL CLINICAL IMPRESSION(S) / ED DIAGNOSES  Final diagnoses:  Left leg pain  Lung infiltrate     ED Discharge Orders    None       Note:  This document was prepared using Dragon voice recognition software and may include unintentional dictation errors.    Nena Polio, MD 04/26/19 2352

## 2019-04-26 NOTE — H&P (Signed)
[facility] [place] NAME: Yolanda Turner    MR#:  [id number]  DATE OF BIRTH:  06-30-1925  DATE OF ADMISSION:  04/26/2019  PRIMARY CARE PHYSICIAN: Albina Billet, MD   REQUESTING/REFERRING PHYSICIAN: Conni Slipper, MD CHIEF COMPLAINT:   Chief Complaint  Patient presents with   Leg Pain  The patient was seen and examined on 04/26/2019  HISTORY OF PRESENT ILLNESS:  Yolanda Turner  is a 85 y.o. Caucasian female with a known history of multiple problems that are mentioned below, including coronary disease, type 2 diabetes mellitus, peripheral vascular disease, GERD, hypertension and CVA, who presented to the emergency room with acute onset of left leg pain that started today and was noted to be hypoxic with a pulse oximetry of 78% on room air.  It has been going up to 96 and later 100% on O2 by nasal cannula.  She has been having mild cough but denies any significant dyspnea orthopnea or paroxysmal nocturnal dyspnea or wheezing.  She has not been able to expectorate with her cough.  She denied any fever or chills.  No nausea vomiting or diarrhea or abdominal pain.  She has not recognize any recent loss of taste or smell.  No recent exposure to COVID-19.  Her left leg pain is not related to ambulation and is without cramps.  Upon presentation to the emergency room, blood pressure was 169/82 with a temperature of 96.1, respirate of 24 and pulse ox 97 8% on room air as mentioned above.  Blood pressure is gone up to 189/84.  Labs revealed unremarkable CMP.  BNP was 17 and high-sensitivity troponin I was 32.  Lactic acid was 1.4 CBC showed anemia but better hemoglobin and hematocrits then back in June of this year.  COVID-19 test is currently pending.  She had a portable chest x-ray that showed finding suspicious for atypical pneumonia versus asymmetric pulmonary edema.  It showed cardiomegaly and previous sternotomy as well as COPD changes.  She had left leg x-ray that showed her  left hip arthroplasty without acute abnormality and her hip x-ray showed her left hip arthroplasty with no signs of fracture or dislocation.  It showed a focal bowel dilatation in the right lower quadrant with recommendation for plain abdominal x-ray to rule out ileus or early small bowel obstruction.  There was signs of calcified atherosclerosis in the pelvis.  An abdominal pelvic CT scan revealed small bowel obstruction likely secondary to an obturator hernia through the left obturator foramen, sigmoid diverticulosis without evidence of diverticulitis and aortic atherosclerosis and chest CT showed multifocal pneumonia there is grade test in the left upper lobe with trace bilateral pleural effusions and moderately distended urinary bladder.  The patient was given 650 mg p.o. Tylenol and 4 mg of IV Zofran twice.  She will be admitted to medical monitored bed for further evaluation and management.  PAST MEDICAL HISTORY:   Past Medical History:  Diagnosis Date   Carotid artery stenosis    Carotid artery stenosis    Collagen vascular disease (HCC)    Coronary artery disease    Diabetes mellitus without complication (HCC)    diet control   GERD (gastroesophageal reflux disease)    Hypertension    Myocardial infarction [facility])    Osteoporosis    Peripheral vascular disease (HCC)    Pneumonia    Renal artery stenosis (HCC)    Status post double vessel coronary artery bypass 2005   Stroke [facility])  Uterine cancer [facility])     PAST SURGICAL HISTORY:   Past Surgical History:  Procedure Laterality Date   ABDOMINAL HYSTERECTOMY     CAROTID ENDARTERECTOMY     CATARACT EXTRACTION W/PHACO Right 09/28/2015   Procedure: CATARACT EXTRACTION PHACO AND INTRAOCULAR LENS PLACEMENT (IOC);  Surgeon: Estill Cotta, MD;  Location: [facility] ORS;  Service: Ophthalmology;  Laterality: Right;  [place] 041:18 AP% 28.6 CDE 114.94 fluid pack lot # [id number]   CORONARY ARTERY BYPASS GRAFT     HIP  ARTHROPLASTY Left 11/29/2018   Procedure: ARTHROPLASTY BIPOLAR HIP (HEMIARTHROPLASTY);  Surgeon: Corky Mull, MD;  Location: [facility] ORS;  Service: Orthopedics;  Laterality: Left;   PERIPHERAL VASCULAR CATHETERIZATION Right 01/22/2015   Procedure: Carotid PTA/Stent Intervention;  Surgeon: Katha Cabal, MD;  Location: [place] [facility];  Service: Cardiovascular;  Laterality: Right;   uterine cancer surgery      SOCIAL HISTORY:   Social History   Tobacco Use   Smoking status: Former Smoker   Smokeless tobacco: Never Used  Substance Use Topics   Alcohol use: No    FAMILY HISTORY:   Family History  Problem Relation Age of Onset   Lung cancer Daughter     DRUG ALLERGIES:   Allergies  Allergen Reactions   Penicillins Rash    Has patient had a PCN reaction causing immediate rash, facial/tongue/throat swelling, SOB or lightheadedness with hypotension: No Has patient had a PCN reaction causing severe rash involving mucus membranes or skin necrosis: No Has patient had a PCN reaction that required hospitalization: No Has patient had a PCN reaction occurring within the last 10 years: No If all of the above answers are "NO", then may proceed with Cephalosporin use.    REVIEW OF SYSTEMS:   ROS As per history of present illness. All pertinent systems were reviewed above. Constitutional,  HEENT, cardiovascular, respiratory, GI, GU, musculoskeletal, neuro, psychiatric, endocrine,  integumentary and hematologic systems were reviewed and are otherwise  negative/unremarkable except for positive findings mentioned above in the HPI.   MEDICATIONS AT HOME:   Prior to Admission medications   Medication Sig Start Date End Date Taking? Authorizing Provider  clopidogrel (PLAVIX) 75 MG tablet Take 75 mg by mouth daily. 04/15/19  Yes [provider]  furosemide (LASIX) 20 MG tablet Take 20 mg by mouth daily. 04/20/19  Yes [provider]  gabapentin (NEURONTIN) 300  MG capsule Take 600 mg by mouth at bedtime.    Yes [provider]  lisinopril (ZESTRIL) 5 MG tablet Take 5 mg by mouth daily.   Yes [provider]  metoprolol succinate (TOPROL-XL) 25 MG 24 hr tablet Take 0.5 tablets (12.5 mg total) by mouth daily. Patient taking differently: Take 12.5 mg by mouth every evening.  10/19/18 04/26/19 Yes Sainani, Belia Heman, MD  simvastatin (ZOCOR) 40 MG tablet Take 40 mg by mouth daily.    Yes [provider]  feeding supplement, ENSURE ENLIVE, (ENSURE ENLIVE) LIQD Take 237 mLs by mouth 2 (two) times daily between meals. 12/06/18   Bettey Costa, MD      VITAL SIGNS:  Blood pressure (!) 169/72, pulse 94, temperature (!) 96.1 F (35.6 C), temperature source Axillary, resp. rate 18, height 4\' 9"  (1.448 m), weight 31.8 kg, SpO2 100 %.  PHYSICAL EXAMINATION:  Physical Exam  GENERAL:  85 y.o.-year-old patient lying in the bed with no acute distress.  EYES: Pupils equal, round, reactive to light and accommodation. No scleral icterus. Extraocular muscles intact.  HEENT:  Head atraumatic, normocephalic. Oropharynx and nasopharynx clear.  NECK:  Supple, no jugular venous distention. No thyroid enlargement, no tenderness.  LUNGS: Diminished bibasilar breath sounds with bibasal and midlung zone crackles CARDIOVASCULAR: Regular rate and rhythm, S1, S2 normal. No murmurs, rubs, or gallops.  ABDOMEN: Soft, nondistended, nontender. Bowel sounds present. No organomegaly or mass.  EXTREMITIES: No pedal edema, cyanosis, or clubbing.  NEUROLOGIC: Cranial nerves II through XII are intact. Muscle strength 5/5 in all extremities. Sensation intact. Gait not checked.  PSYCHIATRIC: The patient is alert and oriented x 3.  Normal affect and good eye contact. SKIN: She has a left heel ulcer with mild surrounding erythema and tenderness.  She stated that her pain is more in the mid leg though.     LABORATORY PANEL:   CBC Recent Labs  Lab 04/26/19 1905  WBC  8.9  HGB 11.5*  HCT 36.6  PLT 219   ------------------------------------------------------------------------------------------------------------------  Chemistries  Recent Labs  Lab 04/26/19 1905  NA 141  K 3.8  CL 104  CO2 25  GLUCOSE 188*  BUN 14  CREATININE 0.78  CALCIUM 9.5  AST 21  ALT 13  ALKPHOS 61  BILITOT 0.6   ------------------------------------------------------------------------------------------------------------------  Cardiac Enzymes No results for input(s): TROPONINI in the last 168 hours. ------------------------------------------------------------------------------------------------------------------  RADIOLOGY:  Dg Chest 1 View  Result Date: 04/26/2019 CLINICAL DATA:  Left hip pain and leg pain. EXAM: CHEST  1 VIEW COMPARISON:  Chest x-ray 12/04/2018 FINDINGS: Heart size remains enlarged following median sternotomy with signs of aortic atherosclerosis. Increased interstitial markings bilaterally with more pronounced opacity in the left chest, left upper lobe. No signs of pleural effusion. Lungs are less hyperinflated than before. No acute bone finding. IMPRESSION: Findings that are suspicious for atypical pneumonia versus asymmetric pulmonary edema. Cardiomegaly and changes of sternotomy as before. COPD Electronically Signed   By: Zetta Bills M.D.   On: 04/26/2019 19:34   Ct Chest W Contrast  Result Date: 04/26/2019 CLINICAL DATA:  Interstitial lung disease. New infiltrates on chest x-ray. Abdominal distension with concern for bowel obstruction. EXAM: CT CHEST, ABDOMEN, AND PELVIS WITH CONTRAST TECHNIQUE: Multidetector CT imaging of the chest, abdomen and pelvis was performed following the standard protocol during bolus administration of intravenous contrast. CONTRAST:  27mL OMNIPAQUE IOHEXOL 300 MG/ML  SOLN COMPARISON:  CT chest dated December 04, 2018. CT of the pelvis dated 05/24/2013 FINDINGS: CT CHEST FINDINGS Cardiovascular: There is no evidence for  large centrally located pulmonary embolism. Detection of smaller segmental and subsegmental pulmonary emboli is limited by technique and contrast bolus timing. Advanced aortic calcifications are noted. The main pulmonary artery is dilated. The main right and left pulmonary arteries are dilated. The heart is not significantly enlarged. There is no pericardial effusion. Mediastinum/Nodes: --No mediastinal or hilar lymphadenopathy. --No axillary lymphadenopathy. --No supraclavicular lymphadenopathy. --Normal thyroid gland. --The esophagus is unremarkable Lungs/Pleura: There are multifocal areas of consolidation, greatest within the left upper and bilateral lower lobes. There is scarring versus atelectasis at the lung bases. There is no pneumothorax. There are trace bilateral pleural effusions. Musculoskeletal: No chest wall abnormality. No acute or significant osseous findings. CT ABDOMEN PELVIS FINDINGS Hepatobiliary: The liver is normal. Normal gallbladder.There is no biliary ductal dilation. Pancreas: Normal contours without ductal dilatation. No peripancreatic fluid collection. Spleen: No splenic laceration or hematoma. Adrenals/Urinary Tract: --Adrenal glands: No adrenal hemorrhage. --Right kidney/ureter: No hydronephrosis or perinephric hematoma. --Left kidney/ureter: No hydronephrosis or perinephric hematoma. --Urinary bladder: The urinary bladder is moderately distended. Stomach/Bowel: --Stomach/Duodenum:  The stomach is distended with an air-fluid level. --Small bowel: There are dilated loops of small bowel in the low midline abdomen and pelvis measuring up to approximately 3.8 cm in diameter. This appears to be secondary to an obturator hernia through the left obturator foramen. --Colon: Rectosigmoid diverticulosis without acute inflammation. --Appendix: Not visualized. No right lower quadrant inflammation or free fluid. Vascular/Lymphatic: Atherosclerotic calcification is present within the non-aneurysmal  abdominal aorta, without hemodynamically significant stenosis. --No retroperitoneal lymphadenopathy. --No mesenteric lymphadenopathy. --No pelvic or inguinal lymphadenopathy. Reproductive: Status post hysterectomy. No adnexal mass. Other: There is a small amount of free fluid in the patient's abdomen and pelvis. There is no significant free air. Musculoskeletal. No acute displaced fractures. IMPRESSION: 1. Small-bowel obstruction likely secondary to an obturator hernia through the left obturator foramen. 2. Multifocal pneumonia (viral or bacterial), greatest within the left upper lobe. 3. Trace bilateral pleural effusions. 4. Moderately distended urinary bladder. 5. Sigmoid diverticulosis without CT evidence for diverticulitis. 6. Aortic atherosclerosis. Aortic Atherosclerosis (ICD10-I70.0). Electronically Signed   By: Constance Holster M.D.   On: 04/26/2019 23:34   Ct Abdomen Pelvis W Contrast  Result Date: 04/26/2019 CLINICAL DATA:  Interstitial lung disease. New infiltrates on chest x-ray. Abdominal distension with concern for bowel obstruction. EXAM: CT CHEST, ABDOMEN, AND PELVIS WITH CONTRAST TECHNIQUE: Multidetector CT imaging of the chest, abdomen and pelvis was performed following the standard protocol during bolus administration of intravenous contrast. CONTRAST:  17mL OMNIPAQUE IOHEXOL 300 MG/ML  SOLN COMPARISON:  CT chest dated December 04, 2018. CT of the pelvis dated 05/24/2013 FINDINGS: CT CHEST FINDINGS Cardiovascular: There is no evidence for large centrally located pulmonary embolism. Detection of smaller segmental and subsegmental pulmonary emboli is limited by technique and contrast bolus timing. Advanced aortic calcifications are noted. The main pulmonary artery is dilated. The main right and left pulmonary arteries are dilated. The heart is not significantly enlarged. There is no pericardial effusion. Mediastinum/Nodes: --No mediastinal or hilar lymphadenopathy. --No axillary lymphadenopathy.  --No supraclavicular lymphadenopathy. --Normal thyroid gland. --The esophagus is unremarkable Lungs/Pleura: There are multifocal areas of consolidation, greatest within the left upper and bilateral lower lobes. There is scarring versus atelectasis at the lung bases. There is no pneumothorax. There are trace bilateral pleural effusions. Musculoskeletal: No chest wall abnormality. No acute or significant osseous findings. CT ABDOMEN PELVIS FINDINGS Hepatobiliary: The liver is normal. Normal gallbladder.There is no biliary ductal dilation. Pancreas: Normal contours without ductal dilatation. No peripancreatic fluid collection. Spleen: No splenic laceration or hematoma. Adrenals/Urinary Tract: --Adrenal glands: No adrenal hemorrhage. --Right kidney/ureter: No hydronephrosis or perinephric hematoma. --Left kidney/ureter: No hydronephrosis or perinephric hematoma. --Urinary bladder: The urinary bladder is moderately distended. Stomach/Bowel: --Stomach/Duodenum: The stomach is distended with an air-fluid level. --Small bowel: There are dilated loops of small bowel in the low midline abdomen and pelvis measuring up to approximately 3.8 cm in diameter. This appears to be secondary to an obturator hernia through the left obturator foramen. --Colon: Rectosigmoid diverticulosis without acute inflammation. --Appendix: Not visualized. No right lower quadrant inflammation or free fluid. Vascular/Lymphatic: Atherosclerotic calcification is present within the non-aneurysmal abdominal aorta, without hemodynamically significant stenosis. --No retroperitoneal lymphadenopathy. --No mesenteric lymphadenopathy. --No pelvic or inguinal lymphadenopathy. Reproductive: Status post hysterectomy. No adnexal mass. Other: There is a small amount of free fluid in the patient's abdomen and pelvis. There is no significant free air. Musculoskeletal. No acute displaced fractures. IMPRESSION: 1. Small-bowel obstruction likely secondary to an obturator  hernia through the left obturator foramen. 2. Multifocal pneumonia (  viral or bacterial), greatest within the left upper lobe. 3. Trace bilateral pleural effusions. 4. Moderately distended urinary bladder. 5. Sigmoid diverticulosis without CT evidence for diverticulitis. 6. Aortic atherosclerosis. Aortic Atherosclerosis (ICD10-I70.0). Electronically Signed   By: Constance Holster M.D.   On: 04/26/2019 23:34   Dg Abd Portable 2 Views  Result Date: 04/26/2019 CLINICAL DATA:  Dilated bowel for further workup EXAM: PORTABLE ABDOMEN - 2 VIEW COMPARISON:  Hip radiographs 04/26/2019 FINDINGS: Again we demonstrate a dilated loop of small bowel in the right lower quadrant, measuring about 4.1 cm in diameter. There is formed stool in the colon. The upright abdomen radiograph excludes the pelvis, the dilated loop is seen but month certain if there are associated air-fluid levels because the pelvis is excluded. Aortic and splenic artery atherosclerosis. Small vessel stent in the abdomen, possibly of the left renal artery. Left hip hemiarthroplasty. Bony demineralization. The upright view includes some of the chest and demonstrates indistinct left perihilar airspace opacity as well as bilateral interstitial opacity in the lungs. Prior CABG noted. IMPRESSION: 1. Abnormally dilated loop of small bowel in the right lower quadrant, similar to prior appearance the. The appearance is abnormal but nonspecific and could represent local ileus or early obstruction. However, there is formed stool in the colon. 2. Airspace opacity in the left perihilar region with bilateral interstitial accentuation, possibilities include asymmetric edema or pneumonia. 3. Mild enlargement of the cardiopericardial silhouette 4. Atherosclerosis. 5. Bony demineralization. Electronically Signed   By: Van Clines M.D.   On: 04/26/2019 20:13   Dg Hip Unilat W Or Wo Pelvis 2-3 Views Left  Result Date: 04/26/2019 CLINICAL DATA:  Left hip and leg  pain EXAM: DG HIP (WITH OR WITHOUT PELVIS) 2-3V LEFT COMPARISON:  Femoral exam of the same date, postoperative exam of 11/29/2018 FINDINGS: Signs of left hip arthroplasty. No signs of periprosthetic fracture. Osteopenia with minimal degenerative changes in the right hip. No signs of pelvic fracture with evidence of calcified atherosclerotic changes overlying the pelvis. Focal bowel dilation in the right lower quadrant, small bowel loops up to 4 cm are incidentally noted. There is evidence of some stool and gas in the rectum. IMPRESSION: 1. Left hip arthroplasty without signs of acute fracture or dislocation. 2. Focal bowel dilation in the right lower quadrant, small bowel loops up to 4 cm are incidentally noted, consider dedicated abdominal plain film to exclude ileus versus early small bowel obstruction. 3. Signs of calcified atherosclerotic changes in the pelvis. Electronically Signed   By: Zetta Bills M.D.   On: 04/26/2019 19:37   Dg Femur Min 2 Views Left  Result Date: 04/26/2019 CLINICAL DATA:  Leg pain, left leg. EXAM: LEFT FEMUR 2 VIEWS COMPARISON:  11/29/2018 FINDINGS: Post left hip arthroplasty without signs of periprosthetic fracture. Signs of vascular calcification along superficial femoral artery course. Osteopenia. IMPRESSION: Post left hip arthroplasty. No acute abnormality. Electronically Signed   By: Zetta Bills M.D.   On: 04/26/2019 19:30      IMPRESSION AND PLAN:   1.  Multifocal pneumonia with subsequent hypoxemia.  The patient will be admitted to medical monitored bed.  We will continue her on IV Rocephin and Zithromax.  Will await COVID-19 test results.  We added inflammatory markers.  2.  Small bowel obstruction due to obstructed left obturator hernia.  Surgery consult was ordered.  I did notify Dr. Celine Ahr.  I also discussed the case with Dr. Celine Ahr.  She will review her abdominal CT scan.  The patient  was not significantly tender in the left lower quadrant though.  Her left  leg pain could be referred pain.  I order arterial and venous Doppler to rule out arterial or venous obstruction.  3.  Hypertensive urgency.  The patient will be placed on as needed IV labetalol and will continue Cozaar and Toprol-XL.  4.  Dyslipidemia.  Statin therapy can be resumed.  5.  Peripheral neuropathy.  We will continue Neurontin.  6.  History of stroke and carotid artery stenosis.  We will holding off Plavix for now for the possibility of surgical intervention.  7.  DVT prophylaxis.  Subcutaneous Lovenox.   All the records are reviewed and case discussed with ED provider. The plan of care was discussed in details with the patient (and family). I answered all questions. The patient agreed to proceed with the above mentioned plan. Further management will depend upon hospital course.   CODE STATUS: Full code  TOTAL TIME TAKING CARE OF THIS PATIENT: 55 minutes.    Christel Mormon M.D on 04/26/2019 at 11:55 PM  [facility]   From 7 PM-7 AM, contact night-coverage [email or web address]  CC: Primary care physician; Albina Billet, MD   Note: This dictation was prepared with Dragon dictation along with smaller phrase technology. Any transcriptional errors that result from this process are unintentional.

## 2019-04-26 NOTE — ED Notes (Signed)
Lavender, light green and grey tube sent to lab. Pt otf for imaging

## 2019-04-26 NOTE — ED Notes (Signed)
Pt placed on 2 L [place] O2 and O2 is now 93%

## 2019-04-26 NOTE — ED Triage Notes (Signed)
Pt to ED from home via EMS c/o left leg pain.  Pt had recent left hip replacement and rob placed in femur.  Pt was getting up from chair with walker and stumbled but did not fall.  Hx of sciatica as well.  Pt has hx of prior strokes and has trouble speaking at baseline.  Pt given 4mg  zofran en route.

## 2019-04-27 ENCOUNTER — Inpatient Hospital Stay: Payer: Medicare Other

## 2019-04-27 ENCOUNTER — Encounter: Payer: Self-pay | Admitting: Anesthesiology

## 2019-04-27 ENCOUNTER — Inpatient Hospital Stay (HOSPITAL_COMMUNITY): Payer: Medicare Other | Admitting: Anesthesiology

## 2019-04-27 ENCOUNTER — Telehealth: Payer: Self-pay | Admitting: General Surgery

## 2019-04-27 ENCOUNTER — Encounter (HOSPITAL_COMMUNITY): Payer: Self-pay | Admitting: Anesthesiology

## 2019-04-27 ENCOUNTER — Encounter
Admission: EM | Disposition: A | Discharge status: Other Acute Inpatient Hospital | Payer: Self-pay | Source: Home / Self Care | Attending: Internal Medicine

## 2019-04-27 ENCOUNTER — Encounter (HOSPITAL_COMMUNITY)
Admission: AD | Disposition: A | Discharge status: Home Health | Payer: Self-pay | Source: Other Acute Inpatient Hospital | Attending: Internal Medicine

## 2019-04-27 ENCOUNTER — Inpatient Hospital Stay (HOSPITAL_COMMUNITY)
Admission: AD | Admit: 2019-04-27 | Discharge: 2019-05-02 | DRG: 353 | Disposition: A | Discharge status: Home Health | Payer: Medicare Other | Source: Other Acute Inpatient Hospital | Attending: Internal Medicine | Admitting: Internal Medicine

## 2019-04-27 DIAGNOSIS — K450 Other specified abdominal hernia with obstruction, without gangrene: Secondary | ICD-10-CM

## 2019-04-27 DIAGNOSIS — R52 Pain, unspecified: Secondary | ICD-10-CM | POA: Diagnosis not present

## 2019-04-27 DIAGNOSIS — Z880 Allergy status to penicillin: Secondary | ICD-10-CM

## 2019-04-27 DIAGNOSIS — K458 Other specified abdominal hernia without obstruction or gangrene: Secondary | ICD-10-CM | POA: Diagnosis present

## 2019-04-27 DIAGNOSIS — E1165 Type 2 diabetes mellitus with hyperglycemia: Secondary | ICD-10-CM | POA: Diagnosis present

## 2019-04-27 DIAGNOSIS — Z9181 History of falling: Secondary | ICD-10-CM

## 2019-04-27 DIAGNOSIS — L8990 Pressure ulcer of unspecified site, unspecified stage: Secondary | ICD-10-CM | POA: Diagnosis present

## 2019-04-27 DIAGNOSIS — Z87891 Personal history of nicotine dependence: Secondary | ICD-10-CM

## 2019-04-27 DIAGNOSIS — Z20828 Contact with and (suspected) exposure to other viral communicable diseases: Secondary | ICD-10-CM | POA: Diagnosis present

## 2019-04-27 DIAGNOSIS — E11621 Type 2 diabetes mellitus with foot ulcer: Secondary | ICD-10-CM | POA: Diagnosis present

## 2019-04-27 DIAGNOSIS — E1151 Type 2 diabetes mellitus with diabetic peripheral angiopathy without gangrene: Secondary | ICD-10-CM | POA: Diagnosis present

## 2019-04-27 DIAGNOSIS — Z681 Body mass index (BMI) 19 or less, adult: Secondary | ICD-10-CM

## 2019-04-27 DIAGNOSIS — L89620 Pressure ulcer of left heel, unstageable: Secondary | ICD-10-CM

## 2019-04-27 DIAGNOSIS — R918 Other nonspecific abnormal finding of lung field: Secondary | ICD-10-CM

## 2019-04-27 DIAGNOSIS — K56609 Unspecified intestinal obstruction, unspecified as to partial versus complete obstruction: Secondary | ICD-10-CM | POA: Diagnosis present

## 2019-04-27 DIAGNOSIS — L97429 Non-pressure chronic ulcer of left heel and midfoot with unspecified severity: Secondary | ICD-10-CM | POA: Diagnosis present

## 2019-04-27 DIAGNOSIS — I160 Hypertensive urgency: Secondary | ICD-10-CM | POA: Diagnosis present

## 2019-04-27 DIAGNOSIS — F0390 Unspecified dementia without behavioral disturbance: Secondary | ICD-10-CM | POA: Diagnosis present

## 2019-04-27 DIAGNOSIS — R778 Other specified abnormalities of plasma proteins: Secondary | ICD-10-CM | POA: Diagnosis not present

## 2019-04-27 DIAGNOSIS — Z66 Do not resuscitate: Secondary | ICD-10-CM | POA: Diagnosis present

## 2019-04-27 DIAGNOSIS — J189 Pneumonia, unspecified organism: Secondary | ICD-10-CM | POA: Diagnosis present

## 2019-04-27 DIAGNOSIS — Z951 Presence of aortocoronary bypass graft: Secondary | ICD-10-CM

## 2019-04-27 DIAGNOSIS — K5730 Diverticulosis of large intestine without perforation or abscess without bleeding: Secondary | ICD-10-CM | POA: Diagnosis present

## 2019-04-27 DIAGNOSIS — M810 Age-related osteoporosis without current pathological fracture: Secondary | ICD-10-CM | POA: Diagnosis present

## 2019-04-27 DIAGNOSIS — I701 Atherosclerosis of renal artery: Secondary | ICD-10-CM | POA: Diagnosis present

## 2019-04-27 DIAGNOSIS — I480 Paroxysmal atrial fibrillation: Secondary | ICD-10-CM | POA: Diagnosis present

## 2019-04-27 DIAGNOSIS — Z8673 Personal history of transient ischemic attack (TIA), and cerebral infarction without residual deficits: Secondary | ICD-10-CM

## 2019-04-27 DIAGNOSIS — R188 Other ascites: Secondary | ICD-10-CM | POA: Diagnosis present

## 2019-04-27 DIAGNOSIS — I6529 Occlusion and stenosis of unspecified carotid artery: Secondary | ICD-10-CM | POA: Diagnosis present

## 2019-04-27 DIAGNOSIS — R0902 Hypoxemia: Secondary | ICD-10-CM | POA: Diagnosis present

## 2019-04-27 DIAGNOSIS — I5022 Chronic systolic (congestive) heart failure: Secondary | ICD-10-CM | POA: Diagnosis present

## 2019-04-27 DIAGNOSIS — I700 Atherosclerosis of aorta: Secondary | ICD-10-CM | POA: Diagnosis present

## 2019-04-27 DIAGNOSIS — M79652 Pain in left thigh: Secondary | ICD-10-CM | POA: Diagnosis present

## 2019-04-27 DIAGNOSIS — I2510 Atherosclerotic heart disease of native coronary artery without angina pectoris: Secondary | ICD-10-CM | POA: Diagnosis present

## 2019-04-27 DIAGNOSIS — R636 Underweight: Secondary | ICD-10-CM | POA: Diagnosis present

## 2019-04-27 DIAGNOSIS — R9431 Abnormal electrocardiogram [ECG] [EKG]: Secondary | ICD-10-CM | POA: Diagnosis not present

## 2019-04-27 DIAGNOSIS — Z79899 Other long term (current) drug therapy: Secondary | ICD-10-CM

## 2019-04-27 DIAGNOSIS — K219 Gastro-esophageal reflux disease without esophagitis: Secondary | ICD-10-CM | POA: Diagnosis present

## 2019-04-27 DIAGNOSIS — D649 Anemia, unspecified: Secondary | ICD-10-CM | POA: Diagnosis present

## 2019-04-27 DIAGNOSIS — Z90710 Acquired absence of both cervix and uterus: Secondary | ICD-10-CM

## 2019-04-27 DIAGNOSIS — Z7902 Long term (current) use of antithrombotics/antiplatelets: Secondary | ICD-10-CM

## 2019-04-27 DIAGNOSIS — Z8542 Personal history of malignant neoplasm of other parts of uterus: Secondary | ICD-10-CM

## 2019-04-27 DIAGNOSIS — I110 Hypertensive heart disease with heart failure: Secondary | ICD-10-CM | POA: Diagnosis present

## 2019-04-27 DIAGNOSIS — I248 Other forms of acute ischemic heart disease: Secondary | ICD-10-CM | POA: Diagnosis present

## 2019-04-27 DIAGNOSIS — I4891 Unspecified atrial fibrillation: Secondary | ICD-10-CM | POA: Diagnosis not present

## 2019-04-27 DIAGNOSIS — I252 Old myocardial infarction: Secondary | ICD-10-CM

## 2019-04-27 DIAGNOSIS — Z872 Personal history of diseases of the skin and subcutaneous tissue: Secondary | ICD-10-CM | POA: Diagnosis not present

## 2019-04-27 HISTORY — PX: LAPAROTOMY: SHX154

## 2019-04-27 LAB — TROPONIN I (HIGH SENSITIVITY)
Troponin I (High Sensitivity): 80 ng/L — ABNORMAL HIGH (ref <18)
Troponin I (High Sensitivity): 76 ng/L — ABNORMAL HIGH (ref <18)

## 2019-04-27 LAB — C-REACTIVE PROTEIN: CRP: <0.8 mg/dL (ref <1.0)

## 2019-04-27 LAB — LACTIC ACID, PLASMA
Lactic Acid, Venous: 1.2 mmol/L (ref 0.5–1.9)
Lactic Acid, Venous: 1.2 mmol/L (ref 0.5–1.9)

## 2019-04-27 LAB — TYPE AND SCREEN
ABO/RH(D): A POS
Antibody Screen: NEGATIVE

## 2019-04-27 LAB — CBC
HCT: 33.5 % — ABNORMAL LOW (ref 36.0–46.0)
Hemoglobin: 11.2 g/dL — ABNORMAL LOW (ref 12.0–15.0)
MCH: 28.6 pg (ref 26.0–34.0)
MCHC: 33.4 g/dL (ref 30.0–36.0)
MCV: 85.7 fL (ref 80.0–100.0)
Platelets: 203 10*3/uL (ref 150–400)
RBC: 3.91 MIL/uL (ref 3.87–5.11)
RDW: 14.6 % (ref 11.5–15.5)
WBC: 9.3 10*3/uL (ref 4.0–10.5)
nRBC: 0 % (ref 0.0–0.2)

## 2019-04-27 LAB — STREP PNEUMONIAE URINARY ANTIGEN: Strep Pneumo Urinary Antigen: NEGATIVE

## 2019-04-27 LAB — BASIC METABOLIC PANEL
Anion gap: 10 (ref 5–15)
BUN: 13 mg/dL (ref 8–23)
CO2: 27 mmol/L (ref 22–32)
Calcium: 9.3 mg/dL (ref 8.9–10.3)
Chloride: 102 mmol/L (ref 98–111)
Creatinine, Ser: 0.67 mg/dL (ref 0.44–1.00)
GFR calc Af Amer: >60 mL/min (ref >60)
GFR calc non Af Amer: >60 mL/min (ref >60)
Glucose, Bld: 183 mg/dL — ABNORMAL HIGH (ref 70–99)
Potassium: 4.1 mmol/L (ref 3.5–5.1)
Sodium: 139 mmol/L (ref 135–145)

## 2019-04-27 LAB — FERRITIN: Ferritin: 40 ng/mL (ref 11–307)

## 2019-04-27 LAB — LACTATE DEHYDROGENASE: LDH: 167 U/L (ref 98–192)

## 2019-04-27 LAB — INFLUENZA PANEL BY PCR (TYPE A & B)
Influenza A By PCR: NEGATIVE
Influenza B By PCR: NEGATIVE

## 2019-04-27 LAB — GLUCOSE, CAPILLARY: Glucose-Capillary: 204 mg/dL — ABNORMAL HIGH (ref 70–99)

## 2019-04-27 LAB — HIV ANTIBODY (ROUTINE TESTING W REFLEX): HIV Screen 4th Generation wRfx: NONREACTIVE

## 2019-04-27 LAB — FIBRIN DERIVATIVES D-DIMER (ARMC ONLY): Fibrin derivatives D-dimer (ARMC): 2187.83 ng/mL (FEU) — ABNORMAL HIGH (ref 0.00–499.00)

## 2019-04-27 LAB — SARS CORONAVIRUS 2 (TAT 6-24 HRS): SARS Coronavirus 2: NEGATIVE

## 2019-04-27 SURGERY — LAPAROTOMY, EXPLORATORY
Anesthesia: General

## 2019-04-27 MED ORDER — 0.9 % SODIUM CHLORIDE (POUR BTL) OPTIME
TOPICAL | Status: DC | PRN
  Administered 2019-04-27: 1000 mL

## 2019-04-27 MED ORDER — DEXAMETHASONE SODIUM PHOSPHATE 10 MG/ML IJ SOLN
INTRAMUSCULAR | Status: AC
  Filled 2019-04-27: qty 1

## 2019-04-27 MED ORDER — SUCCINYLCHOLINE CHLORIDE 20 MG/ML IJ SOLN
INTRAMUSCULAR | Status: DC | PRN
  Administered 2019-04-27: 60 mg via INTRAVENOUS

## 2019-04-27 MED ORDER — ONDANSETRON HCL 4 MG/2ML IJ SOLN: 4.0000 mg | Freq: Once | INTRAMUSCULAR | Status: DC | PRN

## 2019-04-27 MED ORDER — EPHEDRINE SULFATE 50 MG/ML IJ SOLN
INTRAMUSCULAR | Status: AC
  Filled 2019-04-27: qty 1

## 2019-04-27 MED ORDER — LIDOCAINE HCL (PF) 2 % IJ SOLN
INTRAMUSCULAR | Status: AC
  Filled 2019-04-27: qty 10

## 2019-04-27 MED ORDER — PROPOFOL 10 MG/ML IV BOLUS
INTRAVENOUS | Status: DC | PRN
  Administered 2019-04-27: 50 mg via INTRAVENOUS
  Administered 2019-04-27: 20 mg via INTRAVENOUS

## 2019-04-27 MED ORDER — VANCOMYCIN HCL 500 MG IV SOLR
500.0000 mg | INTRAVENOUS | Status: AC
  Administered 2019-04-27: 500 mg via INTRAVENOUS
  Filled 2019-04-27 (×2): qty 500

## 2019-04-27 MED ORDER — SODIUM CHLORIDE 0.9 % IV SOLN
INTRAVENOUS | Status: DC
  Administered 2019-04-28 – 2019-05-01 (×6): via INTRAVENOUS

## 2019-04-27 MED ORDER — DEXAMETHASONE SODIUM PHOSPHATE 10 MG/ML IJ SOLN
INTRAMUSCULAR | Status: DC | PRN
  Administered 2019-04-27: 3 mg via INTRAVENOUS

## 2019-04-27 MED ORDER — ONDANSETRON HCL 4 MG/2ML IJ SOLN
INTRAMUSCULAR | Status: DC | PRN
  Administered 2019-04-27: 3 mg via INTRAVENOUS

## 2019-04-27 MED ORDER — LACTATED RINGERS IV SOLN
INTRAVENOUS | Status: DC | PRN
  Administered 2019-04-27: 22:00:00 via INTRAVENOUS

## 2019-04-27 MED ORDER — SEVOFLURANE IN SOLN
RESPIRATORY_TRACT | Status: AC
  Filled 2019-04-27: qty 250

## 2019-04-27 MED ORDER — ROCURONIUM BROMIDE 50 MG/5ML IV SOSY
PREFILLED_SYRINGE | INTRAVENOUS | Status: DC | PRN
  Administered 2019-04-27: 10 mg via INTRAVENOUS
  Administered 2019-04-27: 30 mg via INTRAVENOUS

## 2019-04-27 MED ORDER — ETOMIDATE 2 MG/ML IV SOLN
INTRAVENOUS | Status: AC
  Filled 2019-04-27: qty 10

## 2019-04-27 MED ORDER — ROCURONIUM BROMIDE 50 MG/5ML IV SOLN
INTRAVENOUS | Status: AC
  Filled 2019-04-27: qty 1

## 2019-04-27 MED ORDER — ONDANSETRON HCL 4 MG/2ML IJ SOLN
INTRAMUSCULAR | Status: AC
  Filled 2019-04-27: qty 2

## 2019-04-27 MED ORDER — PHENYLEPHRINE HCL-NACL 10-0.9 MG/250ML-% IV SOLN
INTRAVENOUS | Status: DC | PRN
  Administered 2019-04-27: 25 ug/min via INTRAVENOUS

## 2019-04-27 MED ORDER — FENTANYL CITRATE (PF) 250 MCG/5ML IJ SOLN
INTRAMUSCULAR | Status: AC
  Filled 2019-04-27: qty 5

## 2019-04-27 MED ORDER — FENTANYL CITRATE (PF) 100 MCG/2ML IJ SOLN
INTRAMUSCULAR | Status: DC | PRN
  Administered 2019-04-27 (×2): 25 ug via INTRAVENOUS
  Administered 2019-04-27: 50 ug via INTRAVENOUS

## 2019-04-27 MED ORDER — HYDRALAZINE HCL 20 MG/ML IJ SOLN
5.0000 mg | INTRAMUSCULAR | Status: DC
  Administered 2019-04-27 – 2019-04-28 (×2): 5 mg via INTRAVENOUS

## 2019-04-27 MED ORDER — SUGAMMADEX SODIUM 200 MG/2ML IV SOLN
INTRAVENOUS | Status: DC | PRN
  Administered 2019-04-27: 60 mg via INTRAVENOUS

## 2019-04-27 MED ORDER — FENTANYL CITRATE (PF) 100 MCG/2ML IJ SOLN
INTRAMUSCULAR | Status: AC
  Administered 2019-04-27: 25 ug via INTRAVENOUS
  Filled 2019-04-27: qty 2

## 2019-04-27 MED ORDER — FENTANYL CITRATE (PF) 100 MCG/2ML IJ SOLN
25.0000 ug | INTRAMUSCULAR | Status: DC | PRN
  Administered 2019-04-27 – 2019-04-28 (×2): 25 ug via INTRAVENOUS

## 2019-04-27 MED ORDER — HYDRALAZINE HCL 20 MG/ML IJ SOLN
INTRAMUSCULAR | Status: AC
  Administered 2019-04-27: 5 mg via INTRAVENOUS
  Filled 2019-04-27: qty 1

## 2019-04-27 MED ORDER — LIDOCAINE 2% (20 MG/ML) 5 ML SYRINGE
INTRAMUSCULAR | Status: DC | PRN
  Administered 2019-04-27: 30 mg via INTRAVENOUS

## 2019-04-27 MED ORDER — SUCCINYLCHOLINE CHLORIDE 20 MG/ML IJ SOLN
INTRAMUSCULAR | Status: AC
  Filled 2019-04-27: qty 1

## 2019-04-27 SURGICAL SUPPLY — 41 items
APL PRP STRL LF DISP 70% ISPRP (MISCELLANEOUS) ×1
CANISTER SUCT 1200ML W/VALVE (MISCELLANEOUS) ×3 IMPLANT
CHLORAPREP W/TINT 26 (MISCELLANEOUS) ×3 IMPLANT
COVER WAND RF STERILE (DRAPES) ×3 IMPLANT
DRAPE LAPAROTOMY 100X77 ABD (DRAPES) ×3 IMPLANT
DRAPE UTILITY 15X26 TOWEL STRL (DRAPES) ×3 IMPLANT
DRSG TELFA 4X14 ISLAND NADH (GAUZE/BANDAGES/DRESSINGS) ×3 IMPLANT
DRSG TELFA 4X8 ISLAND PHMB (GAUZE/BANDAGES/DRESSINGS) ×3 IMPLANT
ELECT CAUTERY BLADE TIP 2.5 (TIP) ×2
ELECT EZSTD 165MM 6.5IN (MISCELLANEOUS) ×2
ELECT REM PT RETURN 9FT ADLT (ELECTROSURGICAL) ×2
ELECTRODE CAUTERY BLDE TIP 2.5 (TIP) ×2 IMPLANT
ELECTRODE EZSTD 165MM 6.5IN (MISCELLANEOUS) ×2 IMPLANT
ELECTRODE REM PT RTRN 9FT ADLT (ELECTROSURGICAL) ×2 IMPLANT
GLOVE BIO SURGEON STRL SZ 6.5 (GLOVE) ×3 IMPLANT
GLOVE BIOGEL PI IND STRL 7.0 (GLOVE) ×2 IMPLANT
GLOVE BIOGEL PI INDICATOR 7.0 (GLOVE) ×1
GLOVE INDICATOR 7.0 STRL GRN (GLOVE) ×3 IMPLANT
GOWN STRL REUS W/ TWL LRG LVL3 (GOWN DISPOSABLE) ×4 IMPLANT
GOWN STRL REUS W/TWL LRG LVL3 (GOWN DISPOSABLE) ×4
KIT TURNOVER KIT A (KITS) ×3 IMPLANT
LABEL OR SOLS (LABEL) ×3 IMPLANT
NEEDLE HYPO 22GX1.5 SAFETY (NEEDLE) ×3 IMPLANT
NS IRRIG 1000ML POUR BTL (IV SOLUTION) ×3 IMPLANT
PACK BASIN MAJOR ARMC (MISCELLANEOUS) ×3 IMPLANT
PACK COLON CLEAN CLOSURE (MISCELLANEOUS) ×3 IMPLANT
RELOAD PROXIMATE 75MM BLUE (ENDOMECHANICALS) IMPLANT
RELOAD STAPLE 75 3.8 BLU REG (ENDOMECHANICALS) IMPLANT
SPONGE LAP 18X18 RF (DISPOSABLE) ×3 IMPLANT
STAPLER PROXIMATE 75MM BLUE (STAPLE) IMPLANT
STAPLER SKIN PROX 35W (STAPLE) ×3 IMPLANT
SUT PDS AB 1 TP1 96 (SUTURE) ×6 IMPLANT
SUT SILK 2 0 (SUTURE) ×2
SUT SILK 2-0 18XBRD TIE 12 (SUTURE) ×2 IMPLANT
SUT SILK 3 0 (SUTURE) ×2
SUT SILK 3-0 18XBRD TIE 12 (SUTURE) ×2 IMPLANT
SUT VIC AB 3-0 SH 27 (SUTURE)
SUT VIC AB 3-0 SH 27X BRD (SUTURE) IMPLANT
SYR 20ML LL LF (SYRINGE) IMPLANT
SYR 30ML LL (SYRINGE) IMPLANT
TRAY FOLEY MTR SLVR 16FR STAT (SET/KITS/TRAYS/PACK) ×3 IMPLANT

## 2019-04-27 SURGICAL SUPPLY — 31 items
APL PRP STRL LF DISP 70% ISPRP (MISCELLANEOUS) ×1
BLADE SURG 10 STRL SS (BLADE) ×2 IMPLANT
CANISTER SUCT 3000ML PPV (MISCELLANEOUS) ×3 IMPLANT
CHLORAPREP W/TINT 26 (MISCELLANEOUS) ×3 IMPLANT
COVER SURGICAL LIGHT HANDLE (MISCELLANEOUS) ×3 IMPLANT
DRAPE LAPAROSCOPIC ABDOMINAL (DRAPES) ×3 IMPLANT
DRSG OPSITE POSTOP 4X8 (GAUZE/BANDAGES/DRESSINGS) ×2 IMPLANT
ELECT REM PT RETURN 9FT ADLT (ELECTROSURGICAL) ×3
ELECTRODE REM PT RTRN 9FT ADLT (ELECTROSURGICAL) ×1 IMPLANT
GLOVE BIO SURGEON STRL SZ 6 (GLOVE) ×3 IMPLANT
GLOVE INDICATOR 6.5 STRL GRN (GLOVE) ×3 IMPLANT
GOWN STRL REUS W/ TWL LRG LVL3 (GOWN DISPOSABLE) ×2 IMPLANT
GOWN STRL REUS W/TWL LRG LVL3 (GOWN DISPOSABLE) ×12
HANDLE SUCTION POOLE (INSTRUMENTS) ×1 IMPLANT
KIT BASIN OR (CUSTOM PROCEDURE TRAY) ×3 IMPLANT
KIT TURNOVER KIT B (KITS) ×3 IMPLANT
LIGASURE IMPACT 36 18CM CVD LR (INSTRUMENTS) IMPLANT
NS IRRIG 1000ML POUR BTL (IV SOLUTION) ×6 IMPLANT
PACK GENERAL/GYN (CUSTOM PROCEDURE TRAY) ×3 IMPLANT
PAD ARMBOARD 7.5X6 YLW CONV (MISCELLANEOUS) ×3 IMPLANT
PENCIL SMOKE EVACUATOR (MISCELLANEOUS) ×3 IMPLANT
STAPLER VISISTAT 35W (STAPLE) ×3 IMPLANT
SUCTION POOLE HANDLE (INSTRUMENTS) ×3
SUT PDS AB 1 TP1 96 (SUTURE) ×2 IMPLANT
SUT SILK 2 0 SH CR/8 (SUTURE) ×3 IMPLANT
SUT SILK 2 0 TIES 10X30 (SUTURE) ×3 IMPLANT
SUT SILK 3 0 SH CR/8 (SUTURE) ×3 IMPLANT
SUT SILK 3 0 TIES 10X30 (SUTURE) ×3 IMPLANT
SUT VIC AB 3-0 SH 18 (SUTURE) ×2 IMPLANT
TOWEL GREEN STERILE (TOWEL DISPOSABLE) ×3 IMPLANT
TRAY FOLEY W/BAG SLVR 14FR (SET/KITS/TRAYS/PACK) ×2 IMPLANT

## 2019-04-27 NOTE — Progress Notes (Signed)
Report given to [facility]. Flu Swab pending. Nikki aware.

## 2019-04-27 NOTE — Anesthesia Preprocedure Evaluation (Deleted)
Anesthesia Evaluation  Patient identified by MRN, date of birth, ID band Patient confused    Reviewed: Allergy & Precautions, H&P , NPO status , Patient's Chart, lab work & pertinent test results  History of Anesthesia Complications (+) PONV and history of anesthetic complications  Airway Mallampati: III  TM Distance: >3 FB Neck ROM: limited    Dental  (+) Chipped   Pulmonary pneumonia, unresolved, former smoker,           Cardiovascular hypertension, + CAD, + Past MI, + Peripheral Vascular Disease and +CHF  + dysrhythmias Atrial Fibrillation      Neuro/Psych PSYCHIATRIC DISORDERS TIACVA, Residual Symptoms    GI/Hepatic Neg liver ROS, GERD  ,  Endo/Other  diabetes, Type 2  Renal/GU      Musculoskeletal   Abdominal   Peds  Hematology negative hematology ROS (+)   Anesthesia Other Findings Past Medical History: No date: Carotid artery stenosis No date: Carotid artery stenosis No date: Collagen vascular disease (HCC) No date: Coronary artery disease No date: Diabetes mellitus without complication (HCC)     Comment:  diet control No date: GERD (gastroesophageal reflux disease) No date: Hypertension No date: Myocardial infarction ([place]) No date: Osteoporosis No date: Peripheral vascular disease (HCC) No date: Pneumonia No date: Renal artery stenosis ([place]) 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 No date: Stroke [facility]) No date: Uterine cancer ([place])  Past Surgical History: No date: ABDOMINAL HYSTERECTOMY No date: CAROTID ENDARTERECTOMY 09/28/2015: CATARACT EXTRACTION W/PHACO; Right     Comment:  Procedure: CATARACT EXTRACTION PHACO AND INTRAOCULAR               LENS PLACEMENT (IOC);  Surgeon: Estill Cotta, MD;                Location: [facility] ORS;  Service: Ophthalmology;  Laterality:              Right;  [place] 041:18 AP% 28.6 CDE 114.94 fluid pack lot #              [id number] No date: CORONARY  ARTERY BYPASS GRAFT 11/29/2018: HIP ARTHROPLASTY; Left     Comment:  Procedure: ARTHROPLASTY BIPOLAR HIP (HEMIARTHROPLASTY);               Surgeon: Corky Mull, MD;  Location: [facility] ORS;                Service: Orthopedics;  Laterality: Left; 01/22/2015: PERIPHERAL VASCULAR CATHETERIZATION; Right     Comment:  Procedure: Carotid PTA/Stent Intervention;  Surgeon:               Katha Cabal, MD;  Location: [place] [facility];                Service: Cardiovascular;  Laterality: Right; No date: uterine cancer surgery  BMI    Body Mass Index: 15.27 kg/m      Reproductive/Obstetrics negative OB ROS                             Anesthesia Physical Anesthesia Plan  ASA: IV  Anesthesia Plan: General ETT, Rapid Sequence and Cricoid Pressure   Post-op Pain Management:    Induction: Intravenous  PONV Risk Score and Plan: Ondansetron, Dexamethasone, Midazolam and Treatment may vary due to age or medical condition  Airway Management Planned: Oral ETT  Additional Equipment: Arterial line and CVP  Intra-op Plan:   Post-operative Plan: Extubation in OR  and Post-operative intubation/ventilation  Informed Consent: I have reviewed the patients History and Physical, chart, labs and discussed the procedure including the risks, benefits and alternatives for the proposed anesthesia with the patient or authorized representative who has indicated his/her understanding and acceptance.   Patient has DNR.  Suspend DNR.   Dental Advisory Given  Plan Discussed with: Anesthesiologist, CRNA and Surgeon  Anesthesia Plan Comments: (I have been told by nursing staff that there are no ICU or stepdown beds available at this time.  With patient co morbidities she may require mechanical ventilation or ICU level care post op.  Discussion with Dr. Celine Ahr about this issue and she is trying to transfer the patient to a hospital with ICU and or stepdown capacity.  History and  phone consent from daughter Geroge Baseman at [phone].   She also suspended the DNR  Daughter informed that patient is higher risk for complications from anesthesia during this procedure due to their medical history  Including but not limited to cardiac arrest and death.  Daughter voiced understanding.  Daughter consented for risks of anesthesia including but not limited to:  - adverse reactions to medications - damage to teeth, lips or other oral mucosa - sore throat or hoarseness - Damage to heart, brain, lungs or loss of life  Daughter voiced understanding.)     Anesthesia Quick Evaluation

## 2019-04-27 NOTE — Anesthesia Procedure Notes (Signed)
Arterial Line Insertion Start/End11/14/2020 9:28 PM, 04/27/2019 9:33 PM Performed by: Audry Pili, MD, anesthesiologist  Patient location: Pre-op. Preanesthetic checklist: patient identified, IV checked, risks and benefits discussed, surgical consent, monitors and equipment checked, pre-op evaluation, timeout performed and anesthesia consent Lidocaine 1% used for infiltration and patient sedated Right, radial was placed Catheter size: 20 G Hand hygiene performed   Attempts: 2 (Previous attempts by CRNAs unsuccessful) Procedure performed using ultrasound guided technique. Ultrasound Notes:anatomy identified, needle tip was noted to be adjacent to the nerve/plexus identified, no ultrasound evidence of intravascular and/or intraneural injection and image(s) printed for medical record Following insertion, dressing applied and Biopatch. Post procedure assessment: unchanged and normal  Patient tolerated the procedure well with no immediate complications.

## 2019-04-27 NOTE — Anesthesia Preprocedure Evaluation (Addendum)
Anesthesia Evaluation  Patient identified by MRN, date of birth, ID band Patient confused    Reviewed: Allergy & Precautions, H&P , NPO status , Patient's Chart, lab work & pertinent test results  History of Anesthesia Complications (+) PONV and history of anesthetic complications  Airway Mallampati: III   Neck ROM: Limited    Dental  (+) Dental Advisory Given, Chipped, Missing   Pulmonary pneumonia, unresolved, former smoker,   '20 CT Chest - Multifocal pneumonia with trace bilateral pleural effusions     breath sounds clear to auscultation       Cardiovascular hypertension, Pt. on medications and Pt. on home beta blockers + CAD, + Past MI, + CABG, + Peripheral Vascular Disease and +CHF  + dysrhythmias Atrial Fibrillation + Valvular Problems/Murmurs MR  Rhythm:Regular Rate:Normal + Systolic murmurs  '20 TTE - EF 20-25%. LV cavity size was moderately dilated. There is moderately increased left ventricular wall thickness. Left ventricular diastolic Doppler parameters are consistent with pseudonormalization. LA and RA were mildly dilated. Moderate MR.  '19 Carotid Duplex - 60-79% right ICAS, 1-39% left ICAS    Neuro/Psych PSYCHIATRIC DISORDERS Dementia CVA, Residual Symptoms    GI/Hepatic Neg liver ROS, GERD  , Obturator hernia    Endo/Other  diabetes, Type 2  Renal/GU  Renal a. stenosis      Musculoskeletal   Abdominal   Peds  Hematology  (+) anemia ,   Anesthesia Other Findings   Reproductive/Obstetrics                            Anesthesia Physical  Anesthesia Plan  ASA: IV and emergent  Anesthesia Plan: General   Post-op Pain Management:    Induction: Intravenous, Rapid sequence and Cricoid pressure planned  PONV Risk Score and Plan: 4 or greater and Ondansetron, Treatment may vary due to age or medical condition and Dexamethasone  Airway Management Planned: Oral  ETT  Additional Equipment: Arterial line and Ultrasound Guidance Line Placement  Intra-op Plan:   Post-operative Plan: Possible Post-op intubation/ventilation  Informed Consent: I have reviewed the patients History and Physical, chart, labs and discussed the procedure including the risks, benefits and alternatives for the proposed anesthesia with the patient or authorized representative who has indicated his/her understanding and acceptance.   Patient has DNR.  Suspend DNR.   Consent reviewed with POA  Plan Discussed with: Anesthesiologist and CRNA  Anesthesia Plan Comments: (History and phone consent obtained by Dr. Amie Critchley from daughter Geroge Baseman at [phone], who also suspended the DNR "Daughter informed that patient is higher risk for complications from anesthesia during this procedure due to their medical history  Including but not limited to cardiac arrest and death.  Daughter voiced understanding. Daughter consented for risks of anesthesia including but not limited to:  - adverse reactions to medications - damage to teeth, lips or other oral mucosa - sore throat or hoarseness - Damage to heart, brain, lungs or loss of life Daughter voiced understanding.")     Anesthesia Quick Evaluation

## 2019-04-27 NOTE — Consult Note (Signed)
Reason for Consult: Obturator hernia, possibly causing bowel obstruction Referring Physician: Eugenie Norrie, MD (hospital medicine)  Yolanda Turner is an 85 y.o. female.  Due to dementia and confusion, the history is obtained primarily from the electronic medical record. HPI: Yolanda Turner  is a 85 y.o. Caucasian female with a known history of multiple problems that are mentioned below, including coronary disease, type 2 diabetes mellitus, peripheral vascular disease, GERD, hypertension and CVA, who presented to the emergency room with acute onset of left leg pain that started today and was noted to be hypoxic with a pulse oximetry of 78% on room air.  It has been going up to 96 and later 100% on O2 by nasal cannula.  She has been having mild cough but denies any significant dyspnea orthopnea or paroxysmal nocturnal dyspnea or wheezing.  She has not been able to expectorate with her cough.  She denied any fever or chills.  No nausea vomiting or diarrhea or abdominal pain.  She has not recognize any recent loss of taste or smell.  No recent exposure to COVID-19.  Her left leg pain is not related to ambulation and is without cramps.  Upon presentation to the emergency room, blood pressure was 169/82 with a temperature of 96.1, respirate of 24 and pulse ox 97 8% on room air as mentioned above.  Blood pressure is gone up to 189/84.  Labs revealed unremarkable CMP.  BNP was 17 and high-sensitivity troponin I was 32.  Lactic acid was 1.4 CBC showed anemia but better hemoglobin and hematocrits then back in June of this year.  COVID-19 test is currently pending.  She had a portable chest x-ray that showed finding suspicious for atypical pneumonia versus asymmetric pulmonary edema.  It showed cardiomegaly and previous sternotomy as well as COPD changes.  She had left leg x-ray that showed her left hip arthroplasty without acute abnormality and her hip x-ray showed her left hip arthroplasty with no signs of fracture or  dislocation.  It showed a focal bowel dilatation in the right lower quadrant with recommendation for plain abdominal x-ray to rule out ileus or early small bowel obstruction.  There was signs of calcified atherosclerosis in the pelvis.  An abdominal pelvic CT scan revealed small bowel obstruction likely secondary to an obturator hernia through the left obturator foramen, sigmoid diverticulosis without evidence of diverticulitis and aortic atherosclerosis and chest CT showed multifocal pneumonia there is grade test in the left upper lobe with trace bilateral pleural effusions and moderately distended urinary bladder.  The patient was given 650 mg p.o. Tylenol and 4 mg of IV Zofran twice.  She will be admitted to medical monitored bed for further evaluation and management.  General surgery is consulted in this context by hospital medicine for evaluation and management of obturator hernia.  Past Medical History:  Diagnosis Date  . Carotid artery stenosis   . Carotid artery stenosis   . Collagen vascular disease ([place])   . Coronary artery disease   . Diabetes mellitus without complication (HCC)    diet control  . GERD (gastroesophageal reflux disease)   . Hypertension   . Myocardial infarction ([place])   . Osteoporosis   . Peripheral vascular disease ([place])   . Pneumonia   . Renal artery stenosis ([place])   . Status post double vessel coronary artery bypass 2005  . Stroke ([place])   . Uterine cancer [facility])     Past Surgical History:  Procedure Laterality Date  . ABDOMINAL HYSTERECTOMY    .  CAROTID ENDARTERECTOMY    . CATARACT EXTRACTION W/PHACO Right 09/28/2015   Procedure: CATARACT EXTRACTION PHACO AND INTRAOCULAR LENS PLACEMENT (IOC);  Surgeon: Estill Cotta, MD;  Location: [facility] ORS;  Service: Ophthalmology;  Laterality: Right;  [place] 041:18 AP% 28.6 CDE 114.94 fluid pack lot # [id number]  . CORONARY ARTERY BYPASS GRAFT    . HIP ARTHROPLASTY Left 11/29/2018   Procedure: ARTHROPLASTY BIPOLAR HIP  (HEMIARTHROPLASTY);  Surgeon: Corky Mull, MD;  Location: [facility] ORS;  Service: Orthopedics;  Laterality: Left;  . PERIPHERAL VASCULAR CATHETERIZATION Right 01/22/2015   Procedure: Carotid PTA/Stent Intervention;  Surgeon: Katha Cabal, MD;  Location: [facility];  Service: Cardiovascular;  Laterality: Right;  . uterine cancer surgery      Family History  Problem Relation Age of Onset  . Lung cancer Daughter     Social History:  reports that she has quit smoking. She has never used smokeless tobacco. She reports that she does not drink alcohol or use drugs.  Allergies:  Allergies  Allergen Reactions  . Penicillins Rash    Has patient had a PCN reaction causing immediate rash, facial/tongue/throat swelling, SOB or lightheadedness with hypotension: No Has patient had a PCN reaction causing severe rash involving mucus membranes or skin necrosis: No Has patient had a PCN reaction that required hospitalization: No Has patient had a PCN reaction occurring within the last 10 years: No If all of the above answers are "NO", then may proceed with Cephalosporin use.    Medications: I have reviewed the patient's current medications.  Results for orders placed or performed during the hospital encounter of 04/26/19 (from the past 48 hour(s))  CBC with Differential     Status: Abnormal   Collection Time: 04/26/19  7:05 PM  Result Value Ref Range   WBC 8.9 4.0 - 10.5 K/uL   RBC 4.07 3.87 - 5.11 MIL/uL   Hemoglobin 11.5 (L) 12.0 - 15.0 g/dL   HCT 36.6 36.0 - 46.0 %   MCV 89.9 80.0 - 100.0 fL   MCH 28.3 26.0 - 34.0 pg   MCHC 31.4 30.0 - 36.0 g/dL   RDW 14.8 11.5 - 15.5 %   Platelets 219 150 - 400 K/uL   nRBC 0.0 0.0 - 0.2 %   Neutrophils Relative % 86 %   Neutro Abs 7.7 1.7 - 7.7 K/uL   Lymphocytes Relative 8 %   Lymphs Abs 0.7 0.7 - 4.0 K/uL   Monocytes Relative 4 %   Monocytes Absolute 0.4 0.1 - 1.0 K/uL   Eosinophils Relative 0 %   Eosinophils Absolute 0.0 0.0 - 0.5 K/uL    Basophils Relative 1 %   Basophils Absolute 0.1 0.0 - 0.1 K/uL   Immature Granulocytes 1 %   Abs Immature Granulocytes 0.06 0.00 - 0.07 K/uL    Comment: Performed at [facility], [address] [place], [place] [address]  Comprehensive metabolic panel     Status: Abnormal   Collection Time: 04/26/19  7:05 PM  Result Value Ref Range   Sodium 141 135 - 145 mmol/L   Potassium 3.8 3.5 - 5.1 mmol/L   Chloride 104 98 - 111 mmol/L   CO2 25 22 - 32 mmol/L   Glucose, Bld 188 (H) 70 - 99 mg/dL   BUN 14 8 - 23 mg/dL   Creatinine, Ser 0.78 0.44 - 1.00 mg/dL   Calcium 9.5 8.9 - 10.3 mg/dL   Total Protein 6.5 6.5 - 8.1 g/dL   Albumin 3.8  3.5 - 5.0 g/dL   AST 21 15 - 41 U/L   ALT 13 0 - 44 U/L   Alkaline Phosphatase 61 38 - 126 U/L   Total Bilirubin 0.6 0.3 - 1.2 mg/dL   GFR calc non Af Amer >60 >60 mL/min   GFR calc Af Amer >60 >60 mL/min   Anion gap 12 5 - 15    Comment: Performed at [facility], [address] [place], [place] [address]  Brain natriuretic peptide     Status: None   Collection Time: 04/26/19  7:05 PM  Result Value Ref Range   B Natriuretic Peptide 17.0 0.0 - 100.0 pg/mL    Comment: Performed at [facility], [place], [place] [address]  Troponin I (High Sensitivity)     Status: Abnormal   Collection Time: 04/26/19  7:05 PM  Result Value Ref Range   Troponin I (High Sensitivity) 32 (H) <18 ng/L    Comment: (NOTE) Elevated high sensitivity troponin I (hsTnI) values and significant  changes across serial measurements may suggest ACS but many other  chronic and acute conditions are known to elevate hsTnI results.  Refer to the "Links" section for chest pain algorithms and additional  guidance. Performed at [facility], [address] [place], [place] [address]   Lactic acid, plasma     Status: None   Collection Time: 04/26/19  7:05 PM  Result Value Ref Range   Lactic Acid, Venous 1.4 0.5 - 1.9 mmol/L     Comment: Performed at [facility], [place], [place] [address]  SARS CORONAVIRUS 2 (TAT 6-24 HRS) Nasopharyngeal Nasopharyngeal Swab     Status: None   Collection Time: 04/26/19  7:05 PM   Specimen: Nasopharyngeal Swab  Result Value Ref Range   SARS Coronavirus 2 NEGATIVE NEGATIVE    Comment: (NOTE) SARS-CoV-2 target nucleic acids are NOT DETECTED. The SARS-CoV-2 RNA is generally detectable in upper and lower respiratory specimens during the acute phase of infection. Negative results do not preclude SARS-CoV-2 infection, do not rule out co-infections with other pathogens, and should not be used as the sole basis for treatment or other patient management decisions. Negative results must be combined with clinical observations, patient history, and epidemiological information. The expected result is Negative. Fact Sheet for Patients: SugarRoll.be Fact Sheet for Healthcare Providers: https://www.woods-mathews.com/ This test is not yet approved or cleared by the [place] FDA and  has been authorized for detection and/or diagnosis of SARS-CoV-2 by FDA under an Emergency Use Authorization (EUA). This EUA will remain  in effect (meaning this test can be used) for the duration of the COVID-19 declaration under Section 56 4(b)(1) of the Act, 21 U.S.C. section 360bbb-3(b)(1), unless the authorization is terminated or revoked sooner. Performed at [facility], [place] [address] [place], [place] [address]   Lactic acid, plasma     Status: None   Collection Time: 04/27/19  4:12 AM  Result Value Ref Range   Lactic Acid, Venous 1.2 0.5 - 1.9 mmol/L    Comment: Performed at [facility], [address] [place], [place] [address]  Troponin I (High Sensitivity)     Status: Abnormal   Collection Time: 04/27/19  4:12 AM  Result Value Ref Range   Troponin I (High Sensitivity) 80 (H) <18 ng/L    Comment: READ BACK  AND VERIFIED WITH NIKKI SOLOMON ON 04/27/19 AT 0530 [place] (NOTE) Elevated high sensitivity troponin I (hsTnI)  values and significant  changes across serial measurements may suggest ACS but many other  chronic and acute conditions are known to elevate hsTnI results.  Refer to the "Links" section for chest pain algorithms and additional  guidance. Performed at [facility], [address] [place], [place] [address]   C-reactive protein     Status: None   Collection Time: 04/27/19  4:12 AM  Result Value Ref Range   CRP <0.8 <1.0 mg/dL    Comment: Performed at [facility], [place] [address] [place], [place] [address]  Ferritin     Status: None   Collection Time: 04/27/19  4:12 AM  Result Value Ref Range   Ferritin 40 11 - 307 ng/mL    Comment: Performed at [facility], [address] [place], [place] [address]  Lactate dehydrogenase     Status: None   Collection Time: 04/27/19  4:12 AM  Result Value Ref Range   LDH 167 98 - 192 U/L    Comment: Performed at [facility], [address] [place], [place] [address]  Fibrin derivatives D-Dimer [facility] only)     Status: Abnormal   Collection Time: 04/27/19  4:12 AM  Result Value Ref Range   Fibrin derivatives D-dimer ([facility]) 2,187.83 (H) 0.00 - 499.00 ng/mL (FEU)    Comment: (NOTE) <> Exclusion of Venous Thromboembolism (VTE) - OUTPATIENT ONLY   (Emergency Department or [place])   0-499 ng/ml (FEU): With a low to intermediate pretest probability                      for VTE this test result excludes the diagnosis                      of VTE.   >499 ng/ml (FEU) : VTE not excluded; additional work up for VTE is                      required. <> Testing on Inpatients and Evaluation of Disseminated Intravascular   Coagulation (DIC) Reference Range:   0-499 ng/ml (FEU) Performed at [facility], [address] [place], [place] [id number]   Basic metabolic panel     Status: Abnormal   Collection  Time: 04/27/19  4:12 AM  Result Value Ref Range   Sodium 139 135 - 145 mmol/L   Potassium 4.1 3.5 - 5.1 mmol/L   Chloride 102 98 - 111 mmol/L   CO2 27 22 - 32 mmol/L   Glucose, Bld 183 (H) 70 - 99 mg/dL   BUN 13 8 - 23 mg/dL   Creatinine, Ser 0.67 0.44 - 1.00 mg/dL   Calcium 9.3 8.9 - 10.3 mg/dL   GFR calc non Af Amer >60 >60 mL/min   GFR calc Af Amer >60 >60 mL/min   Anion gap 10 5 - 15    Comment: Performed at [facility], [address] [place], [place] [address]  CBC     Status: Abnormal   Collection Time: 04/27/19  4:12 AM  Result Value Ref Range   WBC 9.3 4.0 - 10.5 K/uL   RBC 3.91 3.87 - 5.11 MIL/uL   Hemoglobin 11.2 (L) 12.0 - 15.0 g/dL   HCT 33.5 (L) 36.0 - 46.0 %   MCV 85.7 80.0 - 100.0 fL   MCH 28.6 26.0 - 34.0 pg   MCHC 33.4 30.0 - 36.0 g/dL   RDW 14.6 11.5 - 15.5 %  Platelets 203 150 - 400 K/uL   nRBC 0.0 0.0 - 0.2 %    Comment: Performed at [facility], [address] [place], [place] [address]  Culture, blood (routine x 2) Call MD if unable to obtain prior to antibiotics being given     Status: None (Preliminary result)   Collection Time: 04/27/19  4:12 AM   Specimen: BLOOD  Result Value Ref Range   Specimen Description BLOOD RIGHT ANTECUBITAL    Special Requests      BOTTLES DRAWN AEROBIC AND ANAEROBIC Blood Culture results may not be optimal due to an excessive volume of blood received in culture bottles   Culture      NO GROWTH <12 HOURS Performed at [facility], [address] [place], [place] [address]    Report Status PENDING   Culture, blood (routine x 2) Call MD if unable to obtain prior to antibiotics being given     Status: None (Preliminary result)   Collection Time: 04/27/19  4:12 AM   Specimen: BLOOD  Result Value Ref Range   Specimen Description BLOOD RIGHT HAND    Special Requests      BOTTLES DRAWN AEROBIC AND ANAEROBIC Blood Culture results may not be optimal due to an excessive volume of blood received  in culture bottles   Culture      NO GROWTH < 12 HOURS Performed at [facility], [address], [place], [place] [address]    Report Status PENDING   Influenza panel by PCR (type A & B)     Status: None   Collection Time: 04/27/19  4:12 AM  Result Value Ref Range   Influenza A By PCR NEGATIVE NEGATIVE   Influenza B By PCR NEGATIVE NEGATIVE    Comment: (NOTE) The Xpert Xpress Flu assay is intended as an aid in the diagnosis of  influenza and should not be used as a sole basis for treatment.  This  assay is FDA approved for nasopharyngeal swab specimens only. Nasal  washings and aspirates are unacceptable for Xpert Xpress Flu testing. Performed at [facility] [place], [place] [address]   Troponin I (High Sensitivity)     Status: Abnormal   Collection Time: 04/27/19  9:58 AM  Result Value Ref Range   Troponin I (High Sensitivity) 76 (H) <18 ng/L    Comment: (NOTE) Elevated high sensitivity troponin I (hsTnI) values and significant  changes across serial measurements may suggest ACS but many other  chronic and acute conditions are known to elevate hsTnI results.  Refer to the "Links" section for chest pain algorithms and additional  guidance. Performed at [facility], [address] [place], [place] [address]     Dg Chest 1 View  Result Date: 04/26/2019 CLINICAL DATA:  Left hip pain and leg pain. EXAM: CHEST  1 VIEW COMPARISON:  Chest x-ray 12/04/2018 FINDINGS: Heart size remains enlarged following median sternotomy with signs of aortic atherosclerosis. Increased interstitial markings bilaterally with more pronounced opacity in the left chest, left upper lobe. No signs of pleural effusion. Lungs are less hyperinflated than before. No acute bone finding. IMPRESSION: Findings that are suspicious for atypical pneumonia versus asymmetric pulmonary edema. Cardiomegaly and changes of sternotomy as before. COPD Electronically Signed   By:  Zetta Bills M.D.   On: 04/26/2019 19:34   Ct Chest W Contrast  Result Date: 04/26/2019 CLINICAL DATA:  Interstitial lung disease. New infiltrates on chest x-ray. Abdominal distension with  concern for bowel obstruction. EXAM: CT CHEST, ABDOMEN, AND PELVIS WITH CONTRAST TECHNIQUE: Multidetector CT imaging of the chest, abdomen and pelvis was performed following the standard protocol during bolus administration of intravenous contrast. CONTRAST:  59mL OMNIPAQUE IOHEXOL 300 MG/ML  SOLN COMPARISON:  CT chest dated December 04, 2018. CT of the pelvis dated 05/24/2013 FINDINGS: CT CHEST FINDINGS Cardiovascular: There is no evidence for large centrally located pulmonary embolism. Detection of smaller segmental and subsegmental pulmonary emboli is limited by technique and contrast bolus timing. Advanced aortic calcifications are noted. The main pulmonary artery is dilated. The main right and left pulmonary arteries are dilated. The heart is not significantly enlarged. There is no pericardial effusion. Mediastinum/Nodes: --No mediastinal or hilar lymphadenopathy. --No axillary lymphadenopathy. --No supraclavicular lymphadenopathy. --Normal thyroid gland. --The esophagus is unremarkable Lungs/Pleura: There are multifocal areas of consolidation, greatest within the left upper and bilateral lower lobes. There is scarring versus atelectasis at the lung bases. There is no pneumothorax. There are trace bilateral pleural effusions. Musculoskeletal: No chest wall abnormality. No acute or significant osseous findings. CT ABDOMEN PELVIS FINDINGS Hepatobiliary: The liver is normal. Normal gallbladder.There is no biliary ductal dilation. Pancreas: Normal contours without ductal dilatation. No peripancreatic fluid collection. Spleen: No splenic laceration or hematoma. Adrenals/Urinary Tract: --Adrenal glands: No adrenal hemorrhage. --Right kidney/ureter: No hydronephrosis or perinephric hematoma. --Left kidney/ureter: No  hydronephrosis or perinephric hematoma. --Urinary bladder: The urinary bladder is moderately distended. Stomach/Bowel: --Stomach/Duodenum: The stomach is distended with an air-fluid level. --Small bowel: There are dilated loops of small bowel in the low midline abdomen and pelvis measuring up to approximately 3.8 cm in diameter. This appears to be secondary to an obturator hernia through the left obturator foramen. --Colon: Rectosigmoid diverticulosis without acute inflammation. --Appendix: Not visualized. No right lower quadrant inflammation or free fluid. Vascular/Lymphatic: Atherosclerotic calcification is present within the non-aneurysmal abdominal aorta, without hemodynamically significant stenosis. --No retroperitoneal lymphadenopathy. --No mesenteric lymphadenopathy. --No pelvic or inguinal lymphadenopathy. Reproductive: Status post hysterectomy. No adnexal mass. Other: There is a small amount of free fluid in the patient's abdomen and pelvis. There is no significant free air. Musculoskeletal. No acute displaced fractures. IMPRESSION: 1. Small-bowel obstruction likely secondary to an obturator hernia through the left obturator foramen. 2. Multifocal pneumonia (viral or bacterial), greatest within the left upper lobe. 3. Trace bilateral pleural effusions. 4. Moderately distended urinary bladder. 5. Sigmoid diverticulosis without CT evidence for diverticulitis. 6. Aortic atherosclerosis. Aortic Atherosclerosis (ICD10-I70.0). Electronically Signed   By: Constance Holster M.D.   On: 04/26/2019 23:34   Ct Abdomen Pelvis W Contrast  Result Date: 04/26/2019 CLINICAL DATA:  Interstitial lung disease. New infiltrates on chest x-ray. Abdominal distension with concern for bowel obstruction. EXAM: CT CHEST, ABDOMEN, AND PELVIS WITH CONTRAST TECHNIQUE: Multidetector CT imaging of the chest, abdomen and pelvis was performed following the standard protocol during bolus administration of intravenous contrast. CONTRAST:   27mL OMNIPAQUE IOHEXOL 300 MG/ML  SOLN COMPARISON:  CT chest dated December 04, 2018. CT of the pelvis dated 05/24/2013 FINDINGS: CT CHEST FINDINGS Cardiovascular: There is no evidence for large centrally located pulmonary embolism. Detection of smaller segmental and subsegmental pulmonary emboli is limited by technique and contrast bolus timing. Advanced aortic calcifications are noted. The main pulmonary artery is dilated. The main right and left pulmonary arteries are dilated. The heart is not significantly enlarged. There is no pericardial effusion. Mediastinum/Nodes: --No mediastinal or hilar lymphadenopathy. --No axillary lymphadenopathy. --No supraclavicular lymphadenopathy. --Normal thyroid gland. --The esophagus is unremarkable Lungs/Pleura: There  are multifocal areas of consolidation, greatest within the left upper and bilateral lower lobes. There is scarring versus atelectasis at the lung bases. There is no pneumothorax. There are trace bilateral pleural effusions. Musculoskeletal: No chest wall abnormality. No acute or significant osseous findings. CT ABDOMEN PELVIS FINDINGS Hepatobiliary: The liver is normal. Normal gallbladder.There is no biliary ductal dilation. Pancreas: Normal contours without ductal dilatation. No peripancreatic fluid collection. Spleen: No splenic laceration or hematoma. Adrenals/Urinary Tract: --Adrenal glands: No adrenal hemorrhage. --Right kidney/ureter: No hydronephrosis or perinephric hematoma. --Left kidney/ureter: No hydronephrosis or perinephric hematoma. --Urinary bladder: The urinary bladder is moderately distended. Stomach/Bowel: --Stomach/Duodenum: The stomach is distended with an air-fluid level. --Small bowel: There are dilated loops of small bowel in the low midline abdomen and pelvis measuring up to approximately 3.8 cm in diameter. This appears to be secondary to an obturator hernia through the left obturator foramen. --Colon: Rectosigmoid diverticulosis without  acute inflammation. --Appendix: Not visualized. No right lower quadrant inflammation or free fluid. Vascular/Lymphatic: Atherosclerotic calcification is present within the non-aneurysmal abdominal aorta, without hemodynamically significant stenosis. --No retroperitoneal lymphadenopathy. --No mesenteric lymphadenopathy. --No pelvic or inguinal lymphadenopathy. Reproductive: Status post hysterectomy. No adnexal mass. Other: There is a small amount of free fluid in the patient's abdomen and pelvis. There is no significant free air. Musculoskeletal. No acute displaced fractures. IMPRESSION: 1. Small-bowel obstruction likely secondary to an obturator hernia through the left obturator foramen. 2. Multifocal pneumonia (viral or bacterial), greatest within the left upper lobe. 3. Trace bilateral pleural effusions. 4. Moderately distended urinary bladder. 5. Sigmoid diverticulosis without CT evidence for diverticulitis. 6. Aortic atherosclerosis. Aortic Atherosclerosis (ICD10-I70.0). Electronically Signed   By: Constance Holster M.D.   On: 04/26/2019 23:34   US Venous Img Lower Unilateral Left (dvt)  Result Date: 04/27/2019 CLINICAL DATA:  Left lower extremity pain EXAM: LEFT LOWER EXTREMITY VENOUS DOPPLER ULTRASOUND TECHNIQUE: Gray-scale sonography with graded compression, as well as color Doppler and duplex ultrasound were performed to evaluate the lower extremity deep venous systems from the level of the common femoral vein and including the common femoral, femoral, profunda femoral, popliteal and calf veins including the posterior tibial, peroneal and gastrocnemius veins when visible. The superficial great saphenous vein was also interrogated. Spectral Doppler was utilized to evaluate flow at rest and with distal augmentation maneuvers in the common femoral, femoral and popliteal veins. COMPARISON:  None. FINDINGS: Contralateral Common Femoral Vein: Not assessed. Common Femoral Vein: No evidence of thrombus. Normal  compressibility, respiratory phasicity and response to augmentation. Saphenofemoral Junction: No evidence of thrombus. Normal compressibility and flow on color Doppler imaging. Profunda Femoral Vein: No evidence of thrombus. Normal compressibility and flow on color Doppler imaging. Femoral Vein: No evidence of thrombus. Normal compressibility, respiratory phasicity and response to augmentation. Popliteal Vein: No evidence of thrombus. Normal compressibility, respiratory phasicity and response to augmentation. Calf Veins: No evidence of thrombus. Normal compressibility and flow on color Doppler imaging. Superficial Great Saphenous Vein: No evidence of thrombus. Normal compressibility. Venous Reflux:  None. Other Findings:  None. IMPRESSION: No evidence of deep venous thrombosis. Electronically Signed   By: Rolm Baptise M.D.   On: 04/27/2019 02:52   US Arterial Lower Extremity Duplex Left (non-abi)  Result Date: 04/27/2019 CLINICAL DATA:  Left lower extremity pain, peripheral vascular disease, hypertension, diabetes and coronary artery disease. EXAM: LEFT LOWER EXTREMITY ARTERIAL DUPLEX SCAN TECHNIQUE: Gray-scale sonography as well as color Doppler and duplex ultrasound was performed to evaluate the lower extremity arteries including the common, superficial  and profunda femoral arteries, popliteal artery and calf arteries. COMPARISON:  None. FINDINGS: Left lower Extremity Inflow: The common femoral artery demonstrates moderate eccentric calcified plaque without visible significant luminal stenosis. Arterial waveform is triphasic. Outflow: Normal velocities. SFA waveform is biphasic. Profunda femoral waveform is triphasic. Calcified plaque is seen throughout the SFA. Popliteal artery is patent with biphasic waveform. Mild calcified plaque is present in the popliteal artery. Runoff: Limited tibial artery evaluation with anterior and posterior tibial arteries demonstrating normal velocities, biphasic waveforms and  scattered plaque. posterior and anterior tibial arterial waveforms and velocities. Vessels are patent to the ankle. IMPRESSION: Diffuse atherosclerosis of left lower extremity arteries without focal occlusion or abnormal velocities detected by duplex ultrasound. Electronically Signed   By: Aletta Edouard M.D.   On: 04/27/2019 09:03   Dg Abd Portable 2 Views  Result Date: 04/26/2019 CLINICAL DATA:  Dilated bowel for further workup EXAM: PORTABLE ABDOMEN - 2 VIEW COMPARISON:  Hip radiographs 04/26/2019 FINDINGS: Again we demonstrate a dilated loop of small bowel in the right lower quadrant, measuring about 4.1 cm in diameter. There is formed stool in the colon. The upright abdomen radiograph excludes the pelvis, the dilated loop is seen but month certain if there are associated air-fluid levels because the pelvis is excluded. Aortic and splenic artery atherosclerosis. Small vessel stent in the abdomen, possibly of the left renal artery. Left hip hemiarthroplasty. Bony demineralization. The upright view includes some of the chest and demonstrates indistinct left perihilar airspace opacity as well as bilateral interstitial opacity in the lungs. Prior CABG noted. IMPRESSION: 1. Abnormally dilated loop of small bowel in the right lower quadrant, similar to prior appearance the. The appearance is abnormal but nonspecific and could represent local ileus or early obstruction. However, there is formed stool in the colon. 2. Airspace opacity in the left perihilar region with bilateral interstitial accentuation, possibilities include asymmetric edema or pneumonia. 3. Mild enlargement of the cardiopericardial silhouette 4. Atherosclerosis. 5. Bony demineralization. Electronically Signed   By: Van Clines M.D.   On: 04/26/2019 20:13   Dg Hip Unilat W Or Wo Pelvis 2-3 Views Left  Result Date: 04/26/2019 CLINICAL DATA:  Left hip and leg pain EXAM: DG HIP (WITH OR WITHOUT PELVIS) 2-3V LEFT COMPARISON:  Femoral exam  of the same date, postoperative exam of 11/29/2018 FINDINGS: Signs of left hip arthroplasty. No signs of periprosthetic fracture. Osteopenia with minimal degenerative changes in the right hip. No signs of pelvic fracture with evidence of calcified atherosclerotic changes overlying the pelvis. Focal bowel dilation in the right lower quadrant, small bowel loops up to 4 cm are incidentally noted. There is evidence of some stool and gas in the rectum. IMPRESSION: 1. Left hip arthroplasty without signs of acute fracture or dislocation. 2. Focal bowel dilation in the right lower quadrant, small bowel loops up to 4 cm are incidentally noted, consider dedicated abdominal plain film to exclude ileus versus early small bowel obstruction. 3. Signs of calcified atherosclerotic changes in the pelvis. Electronically Signed   By: Zetta Bills M.D.   On: 04/26/2019 19:37   Dg Femur Min 2 Views Left  Result Date: 04/26/2019 CLINICAL DATA:  Leg pain, left leg. EXAM: LEFT FEMUR 2 VIEWS COMPARISON:  11/29/2018 FINDINGS: Post left hip arthroplasty without signs of periprosthetic fracture. Signs of vascular calcification along superficial femoral artery course. Osteopenia. IMPRESSION: Post left hip arthroplasty. No acute abnormality. Electronically Signed   By: Zetta Bills M.D.   On: 04/26/2019 19:30  Review of Systems  Unable to perform ROS: Dementia   Blood pressure (!) 145/67, pulse 92, temperature 97.9 F (36.6 C), temperature source Oral, resp. rate 17, height 4\' 9"  (1.448 m), weight 32 kg, SpO2 98 %. Physical Exam  Constitutional:  She is cachectic and frail-appearing.  HENT:  Head: Atraumatic.  Eyes: Right eye exhibits no discharge. Left eye exhibits no discharge. No scleral icterus.  Neck: No tracheal deviation present.  Cardiovascular: Normal rate and regular rhythm.  Respiratory: Effort normal. No stridor. No respiratory distress.  GI: Soft. She exhibits no distension. There is no abdominal  tenderness.  Genitourinary:    Genitourinary Comments: Deferred   Musculoskeletal:        General: No edema.     Comments: Howship-Romberg sign is positive.  Neurological: She is alert.  Skin: Skin is warm and dry.  Psychiatric: Her behavior is normal.    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  These are extremely rare hernias, found primarily in elderly females who are exceptionally thin.  This patient does fit that picture.  When I saw her this morning, she had been having emesis of pale yellow-green fluid.  I recommended nasogastric tube placement.  The patient has limited decision-making capacity.  I have discussed her case with her granddaughter, [name], who apparently has decision-making power on the patient's behalf.  I have recommended that we proceed to the operating room for reduction of the hernia and repair.  I discussed the risks of the operation extensively.  These include, but are not limited to, bleeding, infection, need for bowel resection, hernia recurrence, need for ICU admission and potentially prolonged intubation, stroke, heart attack, and death.  Nonetheless, they would like to proceed.  I will communicate with the operating room and plan on surgical intervention as anesthesia and OR time permits.  Fredirick Maudlin 04/27/2019, 11:35 AM

## 2019-04-27 NOTE — Progress Notes (Signed)
Pts BP 161/64, MD made aware. No orders received.

## 2019-04-27 NOTE — TOC Initial Note (Signed)
Transition of Care [facility]) - Initial/Assessment Note    Patient Details  Name: Yolanda Turner MRN: [id number] Date of Birth: Apr 03, 1926  Transition of Care [facility]) CM/SW Contact:    Katrina Stack, RN Phone Number: 04/27/2019, 3:39 PM  Clinical Narrative:                Unable to complete assessment in it's entirety due to patient feeling very weak.  Informed by [facility] agency is providing nursing.  Patient currently requiring supplemental oxygen which is acute. Her daughter and granddaughter are involved in patient's care. Left hip arthroplasty in June 2020.  Presented with pain left leg and found to be hypoxic.  Patient does not have underlying chronic cardiopulmonary diagnosis documented . Communicated to team that patient needs to be weaned from oxygen when it is medically stable to do so.          Patient Goals and CMS Choice        Expected Discharge Plan and Services         Living arrangements for the past 2 months: [place]: RN [place] Agency: [place] ([place]) Date [place] Contacted: 04/27/19      Prior Living Arrangements/Services Living arrangements for the past 2 months: [place] Lives with:: Adult Children, Relatives              Current home services: Home RN(With [facility])    Activities of Daily Living   ADL Screening (condition at time of admission) Patient's cognitive ability adequate to safely complete daily activities?: No Is the patient deaf or have difficulty hearing?: No Does the patient have difficulty seeing, even when wearing glasses/contacts?: No Does the patient have difficulty concentrating, remembering, or making decisions?: Yes Patient able to express need for assistance with ADLs?: No Does the patient have difficulty dressing or bathing?: Yes Independently performs ADLs?: No Does the patient have difficulty walking or climbing stairs?: Yes Weakness of Legs:  Both Weakness of Arms/Hands: Both  Permission Sought/Granted                  Emotional Assessment              Admission diagnosis:  Pain [R52] Hypoxia [R09.02] Left leg pain [M79.605] Lung infiltrate [R91.8] Patient Active Problem List   Diagnosis Date Noted  . Pressure injury of skin 04/27/2019  . Hypoxia 04/26/2019  . Respiratory failure, acute ([place]) 12/04/2018  . Hip fracture ([place]) 11/28/2018  . Goals of care, counseling/discussion   . Palliative care by specialist   . GERD (gastroesophageal reflux disease) 10/16/2018  . CAD (coronary artery disease) 10/16/2018  . Atrial fibrillation with RVR ([place]) 10/16/2018  . CVA (cerebral vascular accident) ([place]) 05/07/2018  . Hypertension 02/02/2018  . Diabetes mellitus without complication ([place]) [id number]  . Carotid artery stenosis 02/02/2018  . Acute CVA (cerebrovascular accident) ([place]) 04/04/2017  . Speech problem 04/02/2017  . Confusion 04/02/2017  . TIA (transient ischemic attack) 04/02/2017   PCP:  Albina Billet, MD Pharmacy:   [facility], [address] [place] [address] Phone: [phone] Fax: [phone]     Social Determinants of Health (SDOH) Interventions    Readmission Risk Interventions No flowsheet data found.

## 2019-04-27 NOTE — Progress Notes (Signed)
Pt transported to [place] by [facility] ?

## 2019-04-27 NOTE — Progress Notes (Signed)
Granddaughter notified [facility] here to transport pt.

## 2019-04-27 NOTE — Op Note (Addendum)
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  Foley catheter inserted.  The abdomen was prepped and draped in usual sterile fashion.  A low vertical midline incision was created sharply and then cautery used to dissect the soft tissues and fascia in the midline.  The peritoneal cavity was entered bluntly and the incision completed.  There are omental adhesions to the previous midline which are taken down with cautery.  There is a small amount of murky ascites and the proximal small bowel is noted to be dilated and edematous.  The obturator hernia with incarcerated bowel disease identified.  Gentle traction was employed and the incarcerated small bowel was able to be reduced intact.  The incarcerated segment consisted of an approximately 5 cm length of somewhat proximal jejunum and this appeared viable though somewhat inflamed.  This was reduced into the upper abdomen for a few moments while we address the obturator hernia.  The defect is approximately 1.5 cm in diameter, just large enough to accommodate my index finger.  The sac is left in situ and the peritoneum  overlying the defect is primarily closed with a pursestring suture of 2-0 silk.  This was then imbricated with a second pursestring 2-0 silk to obliterate the hernia defect.  Care was taken in this process that only peritoneum was included in the pursestring suture and there was no injury to the adjacent structures.  We then returned to the abdomen and inspected the bowel.  The stomach appears normal and the NG tube is palpated in the body of the stomach.  The transverse colon and omentum are lifted and the ligament of Treitz identified.  The small bowel was run from ligament of Treitz to the ileocecal valve and no other obstructing lesion was appreciated.  We then ran it a second time from the ileocecal valve proximally.  Inspissated contents in the proximal jejunum were gently milked forward and were easily able to traverse the area of jejunum which had previously been incarcerated.  The previous incarcerated segment looked progressively less inflamed throughout the case and remained completely viable.  The ascending colon and descending colon as well as sigmoid were inspected and were without abnormality, the patient is status post appendectomy.  There are diverticuli and firm stool in the sigmoid colon.  At this juncture the bowel was all returned to the abdomen ensuring no twisting in the mesentery.  The omentum was brought back down to cover the bowel.  The midline incision was closed with running #1 single strand PDS starting at either end and tying centrally with buried knots.  The skin was then reapproximated with staples and a sterile dressing was applied.  The patient was awakened, extubated, and taken to PACU in stable condition.    All counts were correct at the completion of the  case.    I updated her Doran Durand Margarito Courser [phone] at the end of the case.  I also update Dr. Marcello Moores (admitting) of [facility] as to the patient's status and operative findings, recommend at least  progressive bed.

## 2019-04-27 NOTE — ED Notes (Signed)
Admitting MD at bedside.

## 2019-04-27 NOTE — Telephone Encounter (Signed)
Attempted to reach daughter to discuss patient and possible surgery. No answer. LM on VM.

## 2019-04-27 NOTE — Transfer of Care (Signed)
Immediate Anesthesia Transfer of Care Note  Patient: Yolanda Turner  Procedure(s) Performed: EXPLORATORY LAPAROTOMY, REDUCTION AND REPAIR OBTURATOR HERNIA (N/A )  Patient Location: PACU  Anesthesia Type:General  Level of Consciousness: drowsy  Airway & Oxygen Therapy: Patient Spontanous Breathing and Patient connected to nasal cannula oxygen  Post-op Assessment: Report given to RN, Post -op Vital signs reviewed and stable and Patient moving all extremities X 4  Post vital signs: Reviewed and stable  Last Vitals:  Vitals Value Taken Time  BP 168/78 04/27/19 2318  Temp    Pulse 97 04/27/19 2324  Resp 32 04/27/19 2324  SpO2 99 % 04/27/19 2324  Vitals shown include unvalidated device data.  Last Pain: There were no vitals filed for this visit.       Complications: No apparent anesthesia complications

## 2019-04-27 NOTE — Anesthesia Procedure Notes (Signed)
Procedure Name: Intubation Date/Time: 04/27/2019 10:01 PM Performed by: Suzy Bouchard, CRNA Pre-anesthesia Checklist: Patient identified, Emergency Drugs available, Suction available, Patient being monitored and Timeout performed Patient Re-evaluated:Patient Re-evaluated prior to induction Oxygen Delivery Method: Circle system utilized Preoxygenation: Pre-oxygenation with 100% oxygen Induction Type: IV induction, Rapid sequence and Cricoid Pressure applied Laryngoscope Size: Miller and 2 Grade View: Grade I Tube type: Oral Tube size: 6.5 mm Number of attempts: 1 Airway Equipment and Method: Stylet Placement Confirmation: positive ETCO2,  ETT inserted through vocal cords under direct vision and breath sounds checked- equal and bilateral Secured at: 20 cm Tube secured with: Tape Dental Injury: Teeth and Oropharynx as per pre-operative assessment

## 2019-04-27 NOTE — Discharge Summary (Signed)
Physician Discharge Summary  Yolanda Turner [id number] DOB: [birth year] DOA: 04/26/2019  PCP: Albina Billet, MD  Admit date: 04/26/2019 Discharge date: 04/27/2019  Admitted From: Home Disposition: Transferred to [facility]  Recommendations for Outpatient Follow-up:  1. Patient to be transferred to [facility]  Discharge Condition: stable CODE STATUS: DNR Diet recommendation: NPO  HPI: Per admitting MD, Yolanda Turner  is a 85 y.o. Caucasian female with a known history of multiple problems that are mentioned below, including coronary disease, type 2 diabetes mellitus, peripheral vascular disease, GERD, hypertension and CVA, who presented to the emergency room with acute onset of left leg pain that started today and was noted to be hypoxic with a pulse oximetry of 78% on room air.  It has been going up to 96 and later 100% on O2 by nasal cannula.  She has been having mild cough but denies any significant dyspnea orthopnea or paroxysmal nocturnal dyspnea or wheezing.  She has not been able to expectorate with her cough.  She denied any fever or chills.  No nausea vomiting or diarrhea or abdominal pain.  She has not recognize any recent loss of taste or smell.  No recent exposure to COVID-19.  Her left leg pain is not related to ambulation and is without cramps. Upon presentation to the emergency room, blood pressure was 169/82 with a temperature of 96.1, respirate of 24 and pulse ox 97 8% on room air as mentioned above.  Blood pressure is gone up to 189/84.  Labs revealed unremarkable CMP.  BNP was 17 and high-sensitivity troponin I was 32.  Lactic acid was 1.4 CBC showed anemia but better hemoglobin and hematocrits then back in June of this year.  COVID-19 test is currently pending.  She had a portable chest x-ray that showed finding suspicious for atypical pneumonia versus asymmetric pulmonary edema.  It showed cardiomegaly and previous sternotomy as well as COPD changes.  She  had left leg x-ray that showed her left hip arthroplasty without acute abnormality and her hip x-ray showed her left hip arthroplasty with no signs of fracture or dislocation.  It showed a focal bowel dilatation in the right lower quadrant with recommendation for plain abdominal x-ray to rule out ileus or early small bowel obstruction.  There was signs of calcified atherosclerosis in the pelvis.  An abdominal pelvic CT scan revealed small bowel obstruction likely secondary to an obturator hernia through the left obturator foramen, sigmoid diverticulosis without evidence of diverticulitis and aortic atherosclerosis and chest CT showed multifocal pneumonia there is grade test in the left upper lobe with trace bilateral pleural effusions and moderately distended urinary bladder. The patient was given 650 mg p.o. Tylenol and 4 mg of IV Zofran twice.  She will be admitted to medical monitored bed for further evaluation and management.  Hospital Course / Discharge diagnoses:  Principal Problem Small bowel obstruction due to obstructed left obturator hernia -General surgery was consulted and evaluated patient. Dr. Celine Ahr with surgery discussed in detail with patient's family/POA and elected to pursue surgery.  There were concerns however that there are no ICU beds here at [facility] and indicates that patient may need ICU following the surgery would be difficult to place and she would prefer patient have surgery in a different institution.  I have discussed case with Dr. Windle Guard with general surgery at [facility] who will see patient in consultation on arrival. Multifocal pneumonia with acute hypoxic respiratory failure -minimal hypoxia, satting 100% on  2 L, started on ceftriaxone and azithromycin in the ED, continue.  COVID-19 was negative on 11/13.  CT scan suggested mainly located in the left upper lobe. Hypertensive urgency -Blood pressure improved, continue IV as needed's Chronic systolic CHF -Most recent 2D echo  was done in May 2020 showed severely reduced EF 20-35%.  Currently appears euvolemic. History of stroke and carotid artery stenosis, coronary artery disease -Hold of Plavix for now pending surgery evaluation  Elevated troponin -Likely demand ischemia in the setting of pneumonia, downtrending Hypertension -Continue home medications Left heel ulcer -Does not actively appear infected. Vascular studies with arterial Doppler shows diffuse atherosclerosis of the left lower extremity without focal occlusion or abnormal velocities. Lower extremity venous Doppler without evidence of DVT. Local wound care Mild dementia-daughter [name] have decision-making power on patient's behalf.  Discharge Instructions   Allergies as of 04/27/2019      Reactions   Penicillins Rash   Has patient had a PCN reaction causing immediate rash, facial/tongue/throat swelling, SOB or lightheadedness with hypotension: No Has patient had a PCN reaction causing severe rash involving mucus membranes or skin necrosis: No Has patient had a PCN reaction that required hospitalization: No Has patient had a PCN reaction occurring within the last 10 years: No If all of the above answers are "NO", then may proceed with Cephalosporin use.      Medication List    TAKE these medications   clopidogrel 75 MG tablet Commonly known as: PLAVIX Take 75 mg by mouth daily.   feeding supplement (ENSURE ENLIVE) Liqd Take 237 mLs by mouth 2 (two) times daily between meals.   furosemide 20 MG tablet Commonly known as: LASIX Take 20 mg by mouth daily.   gabapentin 300 MG capsule Commonly known as: NEURONTIN Take 600 mg by mouth at bedtime.   lisinopril 5 MG tablet Commonly known as: ZESTRIL Take 5 mg by mouth daily.   metoprolol succinate 25 MG 24 hr tablet Commonly known as: TOPROL-XL Take 0.5 tablets (12.5 mg total) by mouth daily. What changed: when to take this   simvastatin 40 MG tablet Commonly known  as: ZOCOR Take 40 mg by mouth daily.        Consultations:  General surgery Dr. Fredirick Maudlin  Procedures/Studies:  Dg Chest 1 View  Result Date: 04/26/2019 CLINICAL DATA:  Left hip pain and leg pain. EXAM: CHEST  1 VIEW COMPARISON:  Chest x-ray 12/04/2018 FINDINGS: Heart size remains enlarged following median sternotomy with signs of aortic atherosclerosis. Increased interstitial markings bilaterally with more pronounced opacity in the left chest, left upper lobe. No signs of pleural effusion. Lungs are less hyperinflated than before. No acute bone finding. IMPRESSION: Findings that are suspicious for atypical pneumonia versus asymmetric pulmonary edema. Cardiomegaly and changes of sternotomy as before. COPD Electronically Signed   By: Zetta Bills M.D.   On: 04/26/2019 19:34   Ct Chest W Contrast  Result Date: 04/26/2019 CLINICAL DATA:  Interstitial lung disease. New infiltrates on chest x-ray. Abdominal distension with concern for bowel obstruction. EXAM: CT CHEST, ABDOMEN, AND PELVIS WITH CONTRAST TECHNIQUE: Multidetector CT imaging of the chest, abdomen and pelvis was performed following the standard protocol during bolus administration of intravenous contrast. CONTRAST:  68mL OMNIPAQUE IOHEXOL 300 MG/ML  SOLN COMPARISON:  CT chest dated December 04, 2018. CT of the pelvis dated 05/24/2013 FINDINGS: CT CHEST FINDINGS Cardiovascular: There is no evidence for large centrally located pulmonary embolism. Detection of smaller segmental and subsegmental pulmonary emboli is  limited by technique and contrast bolus timing. Advanced aortic calcifications are noted. The main pulmonary artery is dilated. The main right and left pulmonary arteries are dilated. The heart is not significantly enlarged. There is no pericardial effusion. Mediastinum/Nodes: --No mediastinal or hilar lymphadenopathy. --No axillary lymphadenopathy. --No supraclavicular lymphadenopathy. --Normal thyroid gland. --The esophagus  is unremarkable Lungs/Pleura: There are multifocal areas of consolidation, greatest within the left upper and bilateral lower lobes. There is scarring versus atelectasis at the lung bases. There is no pneumothorax. There are trace bilateral pleural effusions. Musculoskeletal: No chest wall abnormality. No acute or significant osseous findings. CT ABDOMEN PELVIS FINDINGS Hepatobiliary: The liver is normal. Normal gallbladder.There is no biliary ductal dilation. Pancreas: Normal contours without ductal dilatation. No peripancreatic fluid collection. Spleen: No splenic laceration or hematoma. Adrenals/Urinary Tract: --Adrenal glands: No adrenal hemorrhage. --Right kidney/ureter: No hydronephrosis or perinephric hematoma. --Left kidney/ureter: No hydronephrosis or perinephric hematoma. --Urinary bladder: The urinary bladder is moderately distended. Stomach/Bowel: --Stomach/Duodenum: The stomach is distended with an air-fluid level. --Small bowel: There are dilated loops of small bowel in the low midline abdomen and pelvis measuring up to approximately 3.8 cm in diameter. This appears to be secondary to an obturator hernia through the left obturator foramen. --Colon: Rectosigmoid diverticulosis without acute inflammation. --Appendix: Not visualized. No right lower quadrant inflammation or free fluid. Vascular/Lymphatic: Atherosclerotic calcification is present within the non-aneurysmal abdominal aorta, without hemodynamically significant stenosis. --No retroperitoneal lymphadenopathy. --No mesenteric lymphadenopathy. --No pelvic or inguinal lymphadenopathy. Reproductive: Status post hysterectomy. No adnexal mass. Other: There is a small amount of free fluid in the patient's abdomen and pelvis. There is no significant free air. Musculoskeletal. No acute displaced fractures. IMPRESSION: 1. Small-bowel obstruction likely secondary to an obturator hernia through the left obturator foramen. 2. Multifocal pneumonia (viral or  bacterial), greatest within the left upper lobe. 3. Trace bilateral pleural effusions. 4. Moderately distended urinary bladder. 5. Sigmoid diverticulosis without CT evidence for diverticulitis. 6. Aortic atherosclerosis. Aortic Atherosclerosis (ICD10-I70.0). Electronically Signed   By: Constance Holster M.D.   On: 04/26/2019 23:34   Ct Abdomen Pelvis W Contrast  Result Date: 04/26/2019 CLINICAL DATA:  Interstitial lung disease. New infiltrates on chest x-ray. Abdominal distension with concern for bowel obstruction. EXAM: CT CHEST, ABDOMEN, AND PELVIS WITH CONTRAST TECHNIQUE: Multidetector CT imaging of the chest, abdomen and pelvis was performed following the standard protocol during bolus administration of intravenous contrast. CONTRAST:  76mL OMNIPAQUE IOHEXOL 300 MG/ML  SOLN COMPARISON:  CT chest dated December 04, 2018. CT of the pelvis dated 05/24/2013 FINDINGS: CT CHEST FINDINGS Cardiovascular: There is no evidence for large centrally located pulmonary embolism. Detection of smaller segmental and subsegmental pulmonary emboli is limited by technique and contrast bolus timing. Advanced aortic calcifications are noted. The main pulmonary artery is dilated. The main right and left pulmonary arteries are dilated. The heart is not significantly enlarged. There is no pericardial effusion. Mediastinum/Nodes: --No mediastinal or hilar lymphadenopathy. --No axillary lymphadenopathy. --No supraclavicular lymphadenopathy. --Normal thyroid gland. --The esophagus is unremarkable Lungs/Pleura: There are multifocal areas of consolidation, greatest within the left upper and bilateral lower lobes. There is scarring versus atelectasis at the lung bases. There is no pneumothorax. There are trace bilateral pleural effusions. Musculoskeletal: No chest wall abnormality. No acute or significant osseous findings. CT ABDOMEN PELVIS FINDINGS Hepatobiliary: The liver is normal. Normal gallbladder.There is no biliary ductal dilation.  Pancreas: Normal contours without ductal dilatation. No peripancreatic fluid collection. Spleen: No splenic laceration or hematoma. Adrenals/Urinary Tract: --Adrenal glands:  No adrenal hemorrhage. --Right kidney/ureter: No hydronephrosis or perinephric hematoma. --Left kidney/ureter: No hydronephrosis or perinephric hematoma. --Urinary bladder: The urinary bladder is moderately distended. Stomach/Bowel: --Stomach/Duodenum: The stomach is distended with an air-fluid level. --Small bowel: There are dilated loops of small bowel in the low midline abdomen and pelvis measuring up to approximately 3.8 cm in diameter. This appears to be secondary to an obturator hernia through the left obturator foramen. --Colon: Rectosigmoid diverticulosis without acute inflammation. --Appendix: Not visualized. No right lower quadrant inflammation or free fluid. Vascular/Lymphatic: Atherosclerotic calcification is present within the non-aneurysmal abdominal aorta, without hemodynamically significant stenosis. --No retroperitoneal lymphadenopathy. --No mesenteric lymphadenopathy. --No pelvic or inguinal lymphadenopathy. Reproductive: Status post hysterectomy. No adnexal mass. Other: There is a small amount of free fluid in the patient's abdomen and pelvis. There is no significant free air. Musculoskeletal. No acute displaced fractures. IMPRESSION: 1. Small-bowel obstruction likely secondary to an obturator hernia through the left obturator foramen. 2. Multifocal pneumonia (viral or bacterial), greatest within the left upper lobe. 3. Trace bilateral pleural effusions. 4. Moderately distended urinary bladder. 5. Sigmoid diverticulosis without CT evidence for diverticulitis. 6. Aortic atherosclerosis. Aortic Atherosclerosis (ICD10-I70.0). Electronically Signed   By: Constance Holster M.D.   On: 04/26/2019 23:34   US Venous Img Lower Unilateral Left (dvt)  Result Date: 04/27/2019 CLINICAL DATA:  Left lower extremity pain EXAM: LEFT LOWER  EXTREMITY VENOUS DOPPLER ULTRASOUND TECHNIQUE: Gray-scale sonography with graded compression, as well as color Doppler and duplex ultrasound were performed to evaluate the lower extremity deep venous systems from the level of the common femoral vein and including the common femoral, femoral, profunda femoral, popliteal and calf veins including the posterior tibial, peroneal and gastrocnemius veins when visible. The superficial great saphenous vein was also interrogated. Spectral Doppler was utilized to evaluate flow at rest and with distal augmentation maneuvers in the common femoral, femoral and popliteal veins. COMPARISON:  None. FINDINGS: Contralateral Common Femoral Vein: Not assessed. Common Femoral Vein: No evidence of thrombus. Normal compressibility, respiratory phasicity and response to augmentation. Saphenofemoral Junction: No evidence of thrombus. Normal compressibility and flow on color Doppler imaging. Profunda Femoral Vein: No evidence of thrombus. Normal compressibility and flow on color Doppler imaging. Femoral Vein: No evidence of thrombus. Normal compressibility, respiratory phasicity and response to augmentation. Popliteal Vein: No evidence of thrombus. Normal compressibility, respiratory phasicity and response to augmentation. Calf Veins: No evidence of thrombus. Normal compressibility and flow on color Doppler imaging. Superficial Great Saphenous Vein: No evidence of thrombus. Normal compressibility. Venous Reflux:  None. Other Findings:  None. IMPRESSION: No evidence of deep venous thrombosis. Electronically Signed   By: Rolm Baptise M.D.   On: 04/27/2019 02:52   US Arterial Lower Extremity Duplex Left (non-abi)  Result Date: 04/27/2019 CLINICAL DATA:  Left lower extremity pain, peripheral vascular disease, hypertension, diabetes and coronary artery disease. EXAM: LEFT LOWER EXTREMITY ARTERIAL DUPLEX SCAN TECHNIQUE: Gray-scale sonography as well as color Doppler and duplex ultrasound was  performed to evaluate the lower extremity arteries including the common, superficial and profunda femoral arteries, popliteal artery and calf arteries. COMPARISON:  None. FINDINGS: Left lower Extremity Inflow: The common femoral artery demonstrates moderate eccentric calcified plaque without visible significant luminal stenosis. Arterial waveform is triphasic. Outflow: Normal velocities. SFA waveform is biphasic. Profunda femoral waveform is triphasic. Calcified plaque is seen throughout the SFA. Popliteal artery is patent with biphasic waveform. Mild calcified plaque is present in the popliteal artery. Runoff: Limited tibial artery evaluation with anterior and posterior tibial  arteries demonstrating normal velocities, biphasic waveforms and scattered plaque. posterior and anterior tibial arterial waveforms and velocities. Vessels are patent to the ankle. IMPRESSION: Diffuse atherosclerosis of left lower extremity arteries without focal occlusion or abnormal velocities detected by duplex ultrasound. Electronically Signed   By: Aletta Edouard M.D.   On: 04/27/2019 09:03   Dg Abd Portable 2 Views  Result Date: 04/26/2019 CLINICAL DATA:  Dilated bowel for further workup EXAM: PORTABLE ABDOMEN - 2 VIEW COMPARISON:  Hip radiographs 04/26/2019 FINDINGS: Again we demonstrate a dilated loop of small bowel in the right lower quadrant, measuring about 4.1 cm in diameter. There is formed stool in the colon. The upright abdomen radiograph excludes the pelvis, the dilated loop is seen but month certain if there are associated air-fluid levels because the pelvis is excluded. Aortic and splenic artery atherosclerosis. Small vessel stent in the abdomen, possibly of the left renal artery. Left hip hemiarthroplasty. Bony demineralization. The upright view includes some of the chest and demonstrates indistinct left perihilar airspace opacity as well as bilateral interstitial opacity in the lungs. Prior CABG noted. IMPRESSION: 1.  Abnormally dilated loop of small bowel in the right lower quadrant, similar to prior appearance the. The appearance is abnormal but nonspecific and could represent local ileus or early obstruction. However, there is formed stool in the colon. 2. Airspace opacity in the left perihilar region with bilateral interstitial accentuation, possibilities include asymmetric edema or pneumonia. 3. Mild enlargement of the cardiopericardial silhouette 4. Atherosclerosis. 5. Bony demineralization. Electronically Signed   By: Van Clines M.D.   On: 04/26/2019 20:13   Dg Hip Unilat W Or Wo Pelvis 2-3 Views Left  Result Date: 04/26/2019 CLINICAL DATA:  Left hip and leg pain EXAM: DG HIP (WITH OR WITHOUT PELVIS) 2-3V LEFT COMPARISON:  Femoral exam of the same date, postoperative exam of 11/29/2018 FINDINGS: Signs of left hip arthroplasty. No signs of periprosthetic fracture. Osteopenia with minimal degenerative changes in the right hip. No signs of pelvic fracture with evidence of calcified atherosclerotic changes overlying the pelvis. Focal bowel dilation in the right lower quadrant, small bowel loops up to 4 cm are incidentally noted. There is evidence of some stool and gas in the rectum. IMPRESSION: 1. Left hip arthroplasty without signs of acute fracture or dislocation. 2. Focal bowel dilation in the right lower quadrant, small bowel loops up to 4 cm are incidentally noted, consider dedicated abdominal plain film to exclude ileus versus early small bowel obstruction. 3. Signs of calcified atherosclerotic changes in the pelvis. Electronically Signed   By: Zetta Bills M.D.   On: 04/26/2019 19:37   Dg Femur Min 2 Views Left  Result Date: 04/26/2019 CLINICAL DATA:  Leg pain, left leg. EXAM: LEFT FEMUR 2 VIEWS COMPARISON:  11/29/2018 FINDINGS: Post left hip arthroplasty without signs of periprosthetic fracture. Signs of vascular calcification along superficial femoral artery course. Osteopenia. IMPRESSION: Post  left hip arthroplasty. No acute abnormality. Electronically Signed   By: Zetta Bills M.D.   On: 04/26/2019 19:30     Subjective: - no complaints    Discharge Exam: BP (!) 180/83 (BP Location: Right Arm)    Pulse 98    Temp 97.9 F (36.6 C) (Oral)    Resp 17    Ht 4\' 9"  (1.448 m)    Wt 32 kg    SpO2 100%    BMI 15.27 kg/m   General: Pt is alert, awake, not in acute distress Cardiovascular: RRR, S1/S2 +, no rubs, no  gallops Respiratory: CTA bilaterally, no wheezing, no rhonchi Abdominal: Soft, mild tenderness to palpation in the lower quadrants, diminished bowel sounds   The results of significant diagnostics from this hospitalization (including imaging, microbiology, ancillary and laboratory) are listed below for reference.     Microbiology: Recent Results (from the past 240 hour(s))  SARS CORONAVIRUS 2 (TAT 6-24 HRS) Nasopharyngeal Nasopharyngeal Swab     Status: None   Collection Time: 04/26/19  7:05 PM   Specimen: Nasopharyngeal Swab  Result Value Ref Range Status   SARS Coronavirus 2 NEGATIVE NEGATIVE Final    Comment: (NOTE) SARS-CoV-2 target nucleic acids are NOT DETECTED. The SARS-CoV-2 RNA is generally detectable in upper and lower respiratory specimens during the acute phase of infection. Negative results do not preclude SARS-CoV-2 infection, do not rule out co-infections with other pathogens, and should not be used as the sole basis for treatment or other patient management decisions. Negative results must be combined with clinical observations, patient history, and epidemiological information. The expected result is Negative. Fact Sheet for Patients: SugarRoll.be Fact Sheet for Healthcare Providers: https://www.woods-mathews.com/ This test is not yet approved or cleared by the [place] FDA and  has been authorized for detection and/or diagnosis of SARS-CoV-2 by FDA under an Emergency Use Authorization (EUA). This  EUA will remain  in effect (meaning this test can be used) for the duration of the COVID-19 declaration under Section 56 4(b)(1) of the Act, 21 U.S.C. section 360bbb-3(b)(1), unless the authorization is terminated or revoked sooner. Performed at [facility], [place] [address] [place], [place] [address]   Culture, blood (routine x 2) Call MD if unable to obtain prior to antibiotics being given     Status: None (Preliminary result)   Collection Time: 04/27/19  4:12 AM   Specimen: BLOOD  Result Value Ref Range Status   Specimen Description BLOOD RIGHT ANTECUBITAL  Final   Special Requests   Final    BOTTLES DRAWN AEROBIC AND ANAEROBIC Blood Culture results may not be optimal due to an excessive volume of blood received in culture bottles   Culture   Final    NO GROWTH <12 HOURS Performed at [facility], [address] [place], [place] [address]    Report Status PENDING  Incomplete  Culture, blood (routine x 2) Call MD if unable to obtain prior to antibiotics being given     Status: None (Preliminary result)   Collection Time: 04/27/19  4:12 AM   Specimen: BLOOD  Result Value Ref Range Status   Specimen Description BLOOD RIGHT HAND  Final   Special Requests   Final    BOTTLES DRAWN AEROBIC AND ANAEROBIC Blood Culture results may not be optimal due to an excessive volume of blood received in culture bottles   Culture   Final    NO GROWTH < 12 HOURS Performed at [facility], [address] [place],  [address]    Report Status PENDING  Incomplete     Labs: Basic Metabolic Panel: Recent Labs  Lab 04/26/19 1905 04/27/19 0412  NA 141 139  K 3.8 4.1  CL 104 102  CO2 25 27  GLUCOSE 188* 183*  BUN 14 13  CREATININE 0.78 0.67  CALCIUM 9.5 9.3   Liver Function Tests: Recent Labs  Lab 04/26/19 1905  AST 21  ALT 13  ALKPHOS 61  BILITOT 0.6  PROT 6.5  ALBUMIN 3.8   CBC: Recent Labs  Lab 04/26/19 1905 04/27/19 0412  WBC  8.9 9.3    NEUTROABS 7.7  --   HGB 11.5* 11.2*  HCT 36.6 33.5*  MCV 89.9 85.7  PLT 219 203   CBG: No results for input(s): GLUCAP in the last 168 hours. Hgb A1c No results for input(s): HGBA1C in the last 72 hours. Lipid Profile No results for input(s): CHOL, HDL, LDLCALC, TRIG, CHOLHDL, LDLDIRECT in the last 72 hours. Thyroid function studies No results for input(s): TSH, T4TOTAL, T3FREE, THYROIDAB in the last 72 hours.  Invalid input(s): FREET3 Urinalysis    Component Value Date/Time   COLORURINE YELLOW (A) 10/16/2018 2027   APPEARANCEUR CLEAR (A) 10/16/2018 2027   LABSPEC 1.019 10/16/2018 2027   PHURINE 5.0 10/16/2018 2027   GLUCOSEU NEGATIVE 10/16/2018 2027   HGBUR NEGATIVE 10/16/2018 2027   BILIRUBINUR NEGATIVE 10/16/2018 2027   KETONESUR 5 (A) 10/16/2018 2027   PROTEINUR NEGATIVE 10/16/2018 2027   NITRITE NEGATIVE 10/16/2018 2027   LEUKOCYTESUR NEGATIVE 10/16/2018 2027    FURTHER DISCHARGE INSTRUCTIONS:   Get Medicines reviewed and adjusted: Please take all your medications with you for your next visit with your Primary MD   Laboratory/radiological data: Please request your Primary MD to go over all hospital tests and procedure/radiological results at the follow up, please ask your Primary MD to get all Hospital records sent to his/her office.   In some cases, they will be blood work, cultures and biopsy results pending at the time of your discharge. Please request that your primary care M.D. goes through all the records of your hospital data and follows up on these results.   Also Note the following: If you experience worsening of your admission symptoms, develop shortness of breath, life threatening emergency, suicidal or homicidal thoughts you must seek medical attention immediately by calling 911 or calling your MD immediately  if symptoms less severe.   You must read complete instructions/literature along with all the possible adverse reactions/side effects for all the  Medicines you take and that have been prescribed to you. Take any new Medicines after you have completely understood and accpet all the possible adverse reactions/side effects.    Do not drive when taking Pain medications or sleeping medications (Benzodaizepines)   Do not take more than prescribed Pain, Sleep and Anxiety Medications. It is not advisable to combine anxiety,sleep and pain medications without talking with your primary care practitioner   Special Instructions: If you have smoked or chewed Tobacco  in the last 2 yrs please stop smoking, stop any regular Alcohol  and or any Recreational drug use.   Wear Seat belts while driving.   Please note: You were cared for by a hospitalist during your hospital stay. Once you are discharged, your primary care physician will handle any further medical issues. Please note that NO REFILLS for any discharge medications will be authorized once you are discharged, as it is imperative that you return to your primary care physician (or establish a relationship with a primary care physician if you do not have one) for your post hospital discharge needs so that they can reassess your need for medications and monitor your lab values.  Time coordinating discharge: 90 minutes  SIGNED:  Marzetta Board, MD, PhD 04/27/2019, 5:01 PM

## 2019-04-27 NOTE — H&P (Signed)
History and Physical    Yolanda Turner [id number] DOB: [birth year] DOA: 04/27/2019  PCP: Albina Billet, MD  Patient coming from: [facility]  I have personally briefly reviewed patient's old medical records in [place]  Chief Complaint: Incarcerated obturator hernia  HPI: Yolanda Turner is a 85 y.o. female with medical history significant of CAD status post MI, type 2 diabetes peripheral vascular disease GERD hypertension CVA, who presented to who presented to [facility] with complaints of left leg pain on 04/26/2019.  Patient patient on further work-up was found to have a small bowel obstruction due to an incarcerated obturator hernia.  Patient was also found to have pneumonia with acute episode of hypoxemia documented in ED.  Patient was admitted to hospitalist service and treated for pneumonia and supportively treated for the small bowel obstruction and surgery was consulted.  Due to multiple comorbidities and current acute medical problems and need for surgical intervention it was recommended that patient be transferred to [facility] for further care due to possible need for critical care bed status post surgery.  Patient is now status post uneventful surgery where incarcerated hernia was reduced.  Patient describes had no surgical complications.  Her respiratory status also throughout surgery had remained stable.  Patient is placed in PCU for further postop care and monitoring.  Currently patient is post surgery status post anesthesia is somewhat lethargic but does follow commands history is mostly taken from chart due to patient current lethargic status.  Review of systems not able to be obtained.   ED Course: As above  Review of Systems: Unable to obtain due to patient current mental status status post surgery/anesthesia  Past Medical History:  Diagnosis Date   Carotid artery stenosis    Carotid artery stenosis    Collagen vascular disease (HCC)    Coronary artery disease     Diabetes mellitus without complication (HCC)    diet control   GERD (gastroesophageal reflux disease)    Hypertension    Myocardial infarction ([place])    Osteoporosis    Peripheral vascular disease (HCC)    Pneumonia    Renal artery stenosis (HCC)    Status post double vessel coronary artery bypass 2005   Stroke [facility])    Uterine cancer [facility])     Past Surgical History:  Procedure Laterality Date   ABDOMINAL HYSTERECTOMY     CAROTID ENDARTERECTOMY     CATARACT EXTRACTION W/PHACO Right 09/28/2015   Procedure: CATARACT EXTRACTION PHACO AND INTRAOCULAR LENS PLACEMENT ([place]);  Surgeon: Estill Cotta, MD;  Location: [facility] ORS;  Service: Ophthalmology;  Laterality: Right;  [place] 041:18 AP% 28.6 CDE 114.94 fluid pack lot # [id number]   CORONARY ARTERY BYPASS GRAFT     HIP ARTHROPLASTY Left 11/29/2018   Procedure: ARTHROPLASTY BIPOLAR HIP (HEMIARTHROPLASTY);  Surgeon: Corky Mull, MD;  Location: [facility] ORS;  Service: Orthopedics;  Laterality: Left;   PERIPHERAL VASCULAR CATHETERIZATION Right 01/22/2015   Procedure: Carotid PTA/Stent Intervention;  Surgeon: Katha Cabal, MD;  Location: [place] CV LAB;  Service: Cardiovascular;  Laterality: Right;   uterine cancer surgery       reports that she has quit smoking. She has never used smokeless tobacco. She reports that she does not drink alcohol or use drugs.  Allergies  Allergen Reactions   Penicillins Rash    Has patient had a PCN reaction causing immediate rash, facial/tongue/throat swelling, SOB or lightheadedness with hypotension: No Has patient had a PCN reaction causing severe rash involving  mucus membranes or skin necrosis: No Has patient had a PCN reaction that required hospitalization: No Has patient had a PCN reaction occurring within the last 10 years: No If all of the above answers are "NO", then may proceed with Cephalosporin use.    Family History  Problem Relation Age of Onset   Lung cancer Daughter      Prior to Admission medications   Medication Sig Start Date End Date Taking? Authorizing Provider  clopidogrel (PLAVIX) 75 MG tablet Take 75 mg by mouth daily. 04/15/19   [provider]  feeding supplement, ENSURE ENLIVE, (ENSURE ENLIVE) LIQD Take 237 mLs by mouth 2 (two) times daily between meals. 12/06/18   Bettey Costa, MD  furosemide (LASIX) 20 MG tablet Take 20 mg by mouth daily. 04/20/19   [provider]  gabapentin (NEURONTIN) 300 MG capsule Take 600 mg by mouth at bedtime.     [provider]  lisinopril (ZESTRIL) 5 MG tablet Take 5 mg by mouth daily.    [provider]  metoprolol succinate (TOPROL-XL) 25 MG 24 hr tablet Take 0.5 tablets (12.5 mg total) by mouth daily. Patient taking differently: Take 12.5 mg by mouth every evening.  10/19/18 04/26/19  Henreitta Leber, MD  simvastatin (ZOCOR) 40 MG tablet Take 40 mg by mouth daily.     [provider]    Physical Exam: Vitals:   04/27/19 2319 04/27/19 2333 04/27/19 2338 04/27/19 2355  BP:  (!) 185/70  (!) 220/74  Pulse: 94 91 90   Resp: (!) 37 (!) 22 18   SpO2: 100% 96% 96%     Constitutional: NAD, calm, comfortable Vitals:   04/27/19 2319 04/27/19 2333 04/27/19 2338 04/27/19 2355  BP:  (!) 185/70  (!) 220/74  Pulse: 94 91 90   Resp: (!) 37 (!) 22 18   SpO2: 100% 96% 96%    Eyes: PERRL, lids and conjunctivae normal ENMT: Mucous membranes are dry. Posterior pharynx clear of any exudate or lesions.  Neck: normal, supple, no masses, no thyromegaly Respiratory: clear to auscultation anteriorly, no wheezing, no crackles. Normal respiratory effort. No accessory muscle use.  Cardiovascular: Regular rate and rhythm, no murmurs / rubs / gallops. No extremity edema. 2+ pedal pulses. No carotid bruits.  Abdomen: Appropriate surgical tenderness tenderness, no masses palpated. No hepatosplenomegaly. Bowel sounds hypoactive.  Surgical incision clean dry and intact Musculoskeletal: no  clubbing / cyanosis. No joint deformity upper and lower extremities. Good ROM, no contractures. Normal muscle tone.  Skin: no rashes, lesions, ulcers. No induration Neurologic: CN 2-12 grossly intact. Moving all 4.  Psychiatric: Unable to assess, patient is somnolent  Labs on Admission: I have personally reviewed following labs and imaging studies  CBC: Recent Labs  Lab 04/26/19 1905 04/27/19 0412  WBC 8.9 9.3  NEUTROABS 7.7  --   HGB 11.5* 11.2*  HCT 36.6 33.5*  MCV 89.9 85.7  PLT 219 [id number]   Basic Metabolic Panel: Recent Labs  Lab 04/26/19 1905 04/27/19 0412  NA 141 139  K 3.8 4.1  CL 104 102  CO2 25 27  GLUCOSE 188* 183*  BUN 14 13  CREATININE 0.78 0.67  CALCIUM 9.5 9.3   GFR: Estimated Creatinine Clearance: 23.1 mL/min (by C-G formula based on SCr of 0.67 mg/dL). Liver Function Tests: Recent Labs  Lab 04/26/19 1905  AST 21  ALT 13  ALKPHOS 61  BILITOT 0.6  PROT 6.5  ALBUMIN 3.8   No results for input(s): LIPASE, AMYLASE  in the last 168 hours. No results for input(s): AMMONIA in the last 168 hours. Coagulation Profile: No results for input(s): INR, PROTIME in the last 168 hours. Cardiac Enzymes: No results for input(s): CKTOTAL, CKMB, CKMBINDEX, TROPONINI in the last 168 hours. BNP (last 3 results) No results for input(s): PROBNP in the last 8760 hours. HbA1C: No results for input(s): HGBA1C in the last 72 hours. CBG: Recent Labs  Lab 04/27/19 2323  GLUCAP 204*   Lipid Profile: No results for input(s): CHOL, HDL, LDLCALC, TRIG, CHOLHDL, LDLDIRECT in the last 72 hours. Thyroid Function Tests: No results for input(s): TSH, T4TOTAL, FREET4, T3FREE, THYROIDAB in the last 72 hours. Anemia Panel: Recent Labs    04/27/19 0412  FERRITIN 40   Urine analysis:    Component Value Date/Time   COLORURINE YELLOW (A) 10/16/2018 2027   APPEARANCEUR CLEAR (A) 10/16/2018 2027   LABSPEC 1.019 10/16/2018 2027   PHURINE 5.0 10/16/2018 2027   GLUCOSEU NEGATIVE  10/16/2018 2027   HGBUR NEGATIVE 10/16/2018 2027   BILIRUBINUR NEGATIVE 10/16/2018 2027   KETONESUR 5 (A) 10/16/2018 2027   PROTEINUR NEGATIVE 10/16/2018 2027   NITRITE NEGATIVE 10/16/2018 2027   LEUKOCYTESUR NEGATIVE 10/16/2018 2027    Radiological Exams on Admission: Dg Chest 1 View  Result Date: 04/26/2019 CLINICAL DATA:  Left hip pain and leg pain. EXAM: CHEST  1 VIEW COMPARISON:  Chest x-ray 12/04/2018 FINDINGS: Heart size remains enlarged following median sternotomy with signs of aortic atherosclerosis. Increased interstitial markings bilaterally with more pronounced opacity in the left chest, left upper lobe. No signs of pleural effusion. Lungs are less hyperinflated than before. No acute bone finding. IMPRESSION: Findings that are suspicious for atypical pneumonia versus asymmetric pulmonary edema. Cardiomegaly and changes of sternotomy as before. COPD Electronically Signed   By: Zetta Bills M.D.   On: 04/26/2019 19:34   Ct Chest W Contrast  Result Date: 04/26/2019 CLINICAL DATA:  Interstitial lung disease. New infiltrates on chest x-ray. Abdominal distension with concern for bowel obstruction. EXAM: CT CHEST, ABDOMEN, AND PELVIS WITH CONTRAST TECHNIQUE: Multidetector CT imaging of the chest, abdomen and pelvis was performed following the standard protocol during bolus administration of intravenous contrast. CONTRAST:  60mL OMNIPAQUE IOHEXOL 300 MG/ML  SOLN COMPARISON:  CT chest dated December 04, 2018. CT of the pelvis dated 05/24/2013 FINDINGS: CT CHEST FINDINGS Cardiovascular: There is no evidence for large centrally located pulmonary embolism. Detection of smaller segmental and subsegmental pulmonary emboli is limited by technique and contrast bolus timing. Advanced aortic calcifications are noted. The main pulmonary artery is dilated. The main right and left pulmonary arteries are dilated. The heart is not significantly enlarged. There is no pericardial effusion. Mediastinum/Nodes: --No  mediastinal or hilar lymphadenopathy. --No axillary lymphadenopathy. --No supraclavicular lymphadenopathy. --Normal thyroid gland. --The esophagus is unremarkable Lungs/Pleura: There are multifocal areas of consolidation, greatest within the left upper and bilateral lower lobes. There is scarring versus atelectasis at the lung bases. There is no pneumothorax. There are trace bilateral pleural effusions. Musculoskeletal: No chest wall abnormality. No acute or significant osseous findings. CT ABDOMEN PELVIS FINDINGS Hepatobiliary: The liver is normal. Normal gallbladder.There is no biliary ductal dilation. Pancreas: Normal contours without ductal dilatation. No peripancreatic fluid collection. Spleen: No splenic laceration or hematoma. Adrenals/Urinary Tract: --Adrenal glands: No adrenal hemorrhage. --Right kidney/ureter: No hydronephrosis or perinephric hematoma. --Left kidney/ureter: No hydronephrosis or perinephric hematoma. --Urinary bladder: The urinary bladder is moderately distended. Stomach/Bowel: --Stomach/Duodenum: The stomach is distended with an air-fluid level. --Small bowel: There  are dilated loops of small bowel in the low midline abdomen and pelvis measuring up to approximately 3.8 cm in diameter. This appears to be secondary to an obturator hernia through the left obturator foramen. --Colon: Rectosigmoid diverticulosis without acute inflammation. --Appendix: Not visualized. No right lower quadrant inflammation or free fluid. Vascular/Lymphatic: Atherosclerotic calcification is present within the non-aneurysmal abdominal aorta, without hemodynamically significant stenosis. --No retroperitoneal lymphadenopathy. --No mesenteric lymphadenopathy. --No pelvic or inguinal lymphadenopathy. Reproductive: Status post hysterectomy. No adnexal mass. Other: There is a small amount of free fluid in the patient's abdomen and pelvis. There is no significant free air. Musculoskeletal. No acute displaced fractures.  IMPRESSION: 1. Small-bowel obstruction likely secondary to an obturator hernia through the left obturator foramen. 2. Multifocal pneumonia (viral or bacterial), greatest within the left upper lobe. 3. Trace bilateral pleural effusions. 4. Moderately distended urinary bladder. 5. Sigmoid diverticulosis without CT evidence for diverticulitis. 6. Aortic atherosclerosis. Aortic Atherosclerosis (ICD10-I70.0). Electronically Signed   By: Constance Holster M.D.   On: 04/26/2019 23:34   Ct Abdomen Pelvis W Contrast  Result Date: 04/26/2019 CLINICAL DATA:  Interstitial lung disease. New infiltrates on chest x-ray. Abdominal distension with concern for bowel obstruction. EXAM: CT CHEST, ABDOMEN, AND PELVIS WITH CONTRAST TECHNIQUE: Multidetector CT imaging of the chest, abdomen and pelvis was performed following the standard protocol during bolus administration of intravenous contrast. CONTRAST:  54mL OMNIPAQUE IOHEXOL 300 MG/ML  SOLN COMPARISON:  CT chest dated December 04, 2018. CT of the pelvis dated 05/24/2013 FINDINGS: CT CHEST FINDINGS Cardiovascular: There is no evidence for large centrally located pulmonary embolism. Detection of smaller segmental and subsegmental pulmonary emboli is limited by technique and contrast bolus timing. Advanced aortic calcifications are noted. The main pulmonary artery is dilated. The main right and left pulmonary arteries are dilated. The heart is not significantly enlarged. There is no pericardial effusion. Mediastinum/Nodes: --No mediastinal or hilar lymphadenopathy. --No axillary lymphadenopathy. --No supraclavicular lymphadenopathy. --Normal thyroid gland. --The esophagus is unremarkable Lungs/Pleura: There are multifocal areas of consolidation, greatest within the left upper and bilateral lower lobes. There is scarring versus atelectasis at the lung bases. There is no pneumothorax. There are trace bilateral pleural effusions. Musculoskeletal: No chest wall abnormality. No acute or  significant osseous findings. CT ABDOMEN PELVIS FINDINGS Hepatobiliary: The liver is normal. Normal gallbladder.There is no biliary ductal dilation. Pancreas: Normal contours without ductal dilatation. No peripancreatic fluid collection. Spleen: No splenic laceration or hematoma. Adrenals/Urinary Tract: --Adrenal glands: No adrenal hemorrhage. --Right kidney/ureter: No hydronephrosis or perinephric hematoma. --Left kidney/ureter: No hydronephrosis or perinephric hematoma. --Urinary bladder: The urinary bladder is moderately distended. Stomach/Bowel: --Stomach/Duodenum: The stomach is distended with an air-fluid level. --Small bowel: There are dilated loops of small bowel in the low midline abdomen and pelvis measuring up to approximately 3.8 cm in diameter. This appears to be secondary to an obturator hernia through the left obturator foramen. --Colon: Rectosigmoid diverticulosis without acute inflammation. --Appendix: Not visualized. No right lower quadrant inflammation or free fluid. Vascular/Lymphatic: Atherosclerotic calcification is present within the non-aneurysmal abdominal aorta, without hemodynamically significant stenosis. --No retroperitoneal lymphadenopathy. --No mesenteric lymphadenopathy. --No pelvic or inguinal lymphadenopathy. Reproductive: Status post hysterectomy. No adnexal mass. Other: There is a small amount of free fluid in the patient's abdomen and pelvis. There is no significant free air. Musculoskeletal. No acute displaced fractures. IMPRESSION: 1. Small-bowel obstruction likely secondary to an obturator hernia through the left obturator foramen. 2. Multifocal pneumonia (viral or bacterial), greatest within the left upper lobe. 3. Trace bilateral  pleural effusions. 4. Moderately distended urinary bladder. 5. Sigmoid diverticulosis without CT evidence for diverticulitis. 6. Aortic atherosclerosis. Aortic Atherosclerosis (ICD10-I70.0). Electronically Signed   By: Constance Holster M.D.   On:  04/26/2019 23:34   US Venous Img Lower Unilateral Left (dvt)  Result Date: 04/27/2019 CLINICAL DATA:  Left lower extremity pain EXAM: LEFT LOWER EXTREMITY VENOUS DOPPLER ULTRASOUND TECHNIQUE: Gray-scale sonography with graded compression, as well as color Doppler and duplex ultrasound were performed to evaluate the lower extremity deep venous systems from the level of the common femoral vein and including the common femoral, femoral, profunda femoral, popliteal and calf veins including the posterior tibial, peroneal and gastrocnemius veins when visible. The superficial great saphenous vein was also interrogated. Spectral Doppler was utilized to evaluate flow at rest and with distal augmentation maneuvers in the common femoral, femoral and popliteal veins. COMPARISON:  None. FINDINGS: Contralateral Common Femoral Vein: Not assessed. Common Femoral Vein: No evidence of thrombus. Normal compressibility, respiratory phasicity and response to augmentation. Saphenofemoral Junction: No evidence of thrombus. Normal compressibility and flow on color Doppler imaging. Profunda Femoral Vein: No evidence of thrombus. Normal compressibility and flow on color Doppler imaging. Femoral Vein: No evidence of thrombus. Normal compressibility, respiratory phasicity and response to augmentation. Popliteal Vein: No evidence of thrombus. Normal compressibility, respiratory phasicity and response to augmentation. Calf Veins: No evidence of thrombus. Normal compressibility and flow on color Doppler imaging. Superficial Great Saphenous Vein: No evidence of thrombus. Normal compressibility. Venous Reflux:  None. Other Findings:  None. IMPRESSION: No evidence of deep venous thrombosis. Electronically Signed   By: Rolm Baptise M.D.   On: 04/27/2019 02:52   US Arterial Lower Extremity Duplex Left (non-abi)  Result Date: 04/27/2019 CLINICAL DATA:  Left lower extremity pain, peripheral vascular disease, hypertension, diabetes and coronary  artery disease. EXAM: LEFT LOWER EXTREMITY ARTERIAL DUPLEX SCAN TECHNIQUE: Gray-scale sonography as well as color Doppler and duplex ultrasound was performed to evaluate the lower extremity arteries including the common, superficial and profunda femoral arteries, popliteal artery and calf arteries. COMPARISON:  None. FINDINGS: Left lower Extremity Inflow: The common femoral artery demonstrates moderate eccentric calcified plaque without visible significant luminal stenosis. Arterial waveform is triphasic. Outflow: Normal velocities. SFA waveform is biphasic. Profunda femoral waveform is triphasic. Calcified plaque is seen throughout the SFA. Popliteal artery is patent with biphasic waveform. Mild calcified plaque is present in the popliteal artery. Runoff: Limited tibial artery evaluation with anterior and posterior tibial arteries demonstrating normal velocities, biphasic waveforms and scattered plaque. posterior and anterior tibial arterial waveforms and velocities. Vessels are patent to the ankle. IMPRESSION: Diffuse atherosclerosis of left lower extremity arteries without focal occlusion or abnormal velocities detected by duplex ultrasound. Electronically Signed   By: Aletta Edouard M.D.   On: 04/27/2019 09:03   Dg Abd Portable 2 Views  Result Date: 04/26/2019 CLINICAL DATA:  Dilated bowel for further workup EXAM: PORTABLE ABDOMEN - 2 VIEW COMPARISON:  Hip radiographs 04/26/2019 FINDINGS: Again we demonstrate a dilated loop of small bowel in the right lower quadrant, measuring about 4.1 cm in diameter. There is formed stool in the colon. The upright abdomen radiograph excludes the pelvis, the dilated loop is seen but month certain if there are associated air-fluid levels because the pelvis is excluded. Aortic and splenic artery atherosclerosis. Small vessel stent in the abdomen, possibly of the left renal artery. Left hip hemiarthroplasty. Bony demineralization. The upright view includes some of the chest  and demonstrates indistinct left perihilar airspace opacity as  well as bilateral interstitial opacity in the lungs. Prior CABG noted. IMPRESSION: 1. Abnormally dilated loop of small bowel in the right lower quadrant, similar to prior appearance the. The appearance is abnormal but nonspecific and could represent local ileus or early obstruction. However, there is formed stool in the colon. 2. Airspace opacity in the left perihilar region with bilateral interstitial accentuation, possibilities include asymmetric edema or pneumonia. 3. Mild enlargement of the cardiopericardial silhouette 4. Atherosclerosis. 5. Bony demineralization. Electronically Signed   By: Van Clines M.D.   On: 04/26/2019 20:13   Dg Hip Unilat W Or Wo Pelvis 2-3 Views Left  Result Date: 04/26/2019 CLINICAL DATA:  Left hip and leg pain EXAM: DG HIP (WITH OR WITHOUT PELVIS) 2-3V LEFT COMPARISON:  Femoral exam of the same date, postoperative exam of 11/29/2018 FINDINGS: Signs of left hip arthroplasty. No signs of periprosthetic fracture. Osteopenia with minimal degenerative changes in the right hip. No signs of pelvic fracture with evidence of calcified atherosclerotic changes overlying the pelvis. Focal bowel dilation in the right lower quadrant, small bowel loops up to 4 cm are incidentally noted. There is evidence of some stool and gas in the rectum. IMPRESSION: 1. Left hip arthroplasty without signs of acute fracture or dislocation. 2. Focal bowel dilation in the right lower quadrant, small bowel loops up to 4 cm are incidentally noted, consider dedicated abdominal plain film to exclude ileus versus early small bowel obstruction. 3. Signs of calcified atherosclerotic changes in the pelvis. Electronically Signed   By: Zetta Bills M.D.   On: 04/26/2019 19:37   Dg Femur Min 2 Views Left  Result Date: 04/26/2019 CLINICAL DATA:  Leg pain, left leg. EXAM: LEFT FEMUR 2 VIEWS COMPARISON:  11/29/2018 FINDINGS: Post left hip  arthroplasty without signs of periprosthetic fracture. Signs of vascular calcification along superficial femoral artery course. Osteopenia. IMPRESSION: Post left hip arthroplasty. No acute abnormality. Electronically Signed   By: Zetta Bills M.D.   On: 04/26/2019 19:30    EKG: Independently reviewed.  Pending  Assessment/Plan Obturator hernia incarcerated with bowel obstruction -Status post surgical reduction -Postsurgical care per surgery -They have recommended continuation of NG tube -Pain medications as written by surgery  Community-acquired pneumonia with hypoxemia -Continue on broad-spectrum antibiotics -Follow-up culture -Supportive care with supplemental O2 titrate off as able  Hypertensive urgency -Resolved prior to transfer from [facility] -Supportive care with as needed -Per surgery okay to resume p.o. meds -Will be seen p.o. hypertensive meds -Supportive care with as needed IV as needed  History of chronic systolic heart failure EF of around 25% -No acute exacerbation at this time -Continue to monitor strict I's and O's -No chronic diuretic at this time due to noted GI losses and n.p.o. state -Agree with gentle IVF at 50 cc/hr  Elevated troponin -Due to demand ischemia -We will cycle troponins -Echocardiogram in a.m.  Left heel ulcer -Wound care to see -Schedule days completed prior to transfer no focal occlusion, stable stable perfusion  Mild dementia -Per prior hospitalist note [name] daughter/granddaughter [name] have decision-making power on patient's behalf.   DVT prophylaxis: Per surgery team code Status: DNR  family Communication: Not applicable  disposition Plan; patient is expected to be admitted for more than 2 midnights consults called: Surgery consult/Dr. Romana Juniper Admission status: Inpatient  Clance Boll MD [facility] Pager [phone]  If 7PM-7AM, please contact night-coverage [email or web address] Password [facility]  04/27/2019,  11:57 PM

## 2019-04-27 NOTE — Progress Notes (Deleted)
PROGRESS NOTE  Yolanda Turner [id number] DOB: January 13, 1926 DOA: 04/26/2019 PCP: Albina Billet, MD   LOS: 1 day   Brief Narrative / Interim history: 85 year old female with CAD, type 2 diabetes mellitus, PVD, GERD, hypertension, prior CVA who came into the hospital with complaints of left leg pain.  She was also noted to be hypoxic to 78% on room air requiring nasal cannula.  She appears to have underlying dementia and her story has not been consistent.  Portable chest x-ray in the ED showed findings suspicious for atypical pneumonia.  CT abdomen pelvis showed small bowel obstruction secondary to an obturator hernia through the left obturator foramen as well as confirmed multifocal pneumonia.  She also appears to have a left heel ulcer but no pain at that site per report  Subjective / 24h Interval events: Appears confused, not able to coherently answer my questions but is alert  Assessment & Plan: Principal Problem Multifocal pneumonia with acute hypoxic respiratory failure -She was started on ceftriaxone, azithromycin, COVID-19 was negative.  Continue antibiotics for total of 5 days -Supportive care with incentive spirometry, sitting up in bed at 30 degrees, aspiration precautions -Once you are able to tolerate p.o. consult speech  Active Problems Small bowel obstruction due to obstructed left obturator hernia -General surgery consulted, appreciate input.  For now continue n.p.o.   -NG tube to be placed, and patient will be taken to the operating room today for surgical notes  Hypertensive urgency -Blood pressure improved, continue IV as needed's  Chronic systolic CHF -Most recent 2D echo was done in May 2020 showed severely reduced EF 20-35%.  Currently closely monitor, she has no p.o. intake due to small bowel obstruction and is on very minimal IV fluids.  Low threshold to DC fluids and give Lasix at any signs of shortness of breath.  Overall difficult balance  History of stroke  and carotid artery stenosis, coronary artery disease -Hold of Plavix for now pending surgery evaluation  Elevated troponin -Likely demand ischemia in the setting of pneumonia, downtrending  Hypertension -Continue home medications  Left heel ulcer -Does not actively appear infected, however she is getting antibiotics for her pneumonia  -Vascular studies with arterial Doppler shows diffuse atherosclerosis of the left lower extremity without focal occlusion or abnormal velocities -Lower extremity venous Doppler without evidence of DVT -Local wound care   Scheduled Meds:  acetaminophen  650 mg Oral Once   clopidogrel  75 mg Oral Daily   enoxaparin (LOVENOX) injection  30 mg Subcutaneous Q24H   feeding supplement (ENSURE ENLIVE)  237 mL Oral BID BM   gabapentin  600 mg Oral QHS   lisinopril  5 mg Oral Daily   metoprolol succinate  12.5 mg Oral QPM   simvastatin  40 mg Oral q1800   Continuous Infusions:  sodium chloride 25 mL/hr at 04/27/19 0905   azithromycin 500 mg (04/27/19 0849)   cefTRIAXone (ROCEPHIN)  IV 2 g (04/27/19 1009)   PRN Meds:.acetaminophen **OR** acetaminophen, labetalol, magnesium hydroxide, ondansetron **OR** ondansetron (ZOFRAN) IV, traZODone  DVT prophylaxis: lovenox Code Status: DNR Family Communication: No family at bedside Disposition Plan: TBD  Consultants:  General surgery   Procedures:  None   Microbiology  SARS-CoV-2 - 04/26/2019 Blood cultures 11/14 2020-no growth  Antimicrobials: Ceftriaxone 11/13 >> Azithromycin 11/13 >>   Objective: Vitals:   04/27/19 0118 04/27/19 0442 04/27/19 0535 04/27/19 0953  BP: (!) 144/77 (!) 168/72 (!) 150/64 (!) 145/67  Pulse: 88 91 87 92  Resp:  16 16 17    Temp: (!) 97.4 F (36.3 C)  98.7 F (37.1 C) 97.9 F (36.6 C)  TempSrc: Axillary  Oral Oral  SpO2: 96% 98% 100% 98%  Weight:   32 kg   Height:   4\' 9"  (1.448 m)     Intake/Output Summary (Last 24 hours) at 04/27/2019 1113 Last data  filed at 04/27/2019 0959 Gross per 24 hour  Intake 328.37 ml  Output 200 ml  Net 128.37 ml   Filed Weights   04/26/19 1814 04/27/19 0535  Weight: 31.8 kg 32 kg    Examination:  Constitutional: Cachectic appearing Caucasian female Eyes: lids and conjunctivae normal, no scleral icterus ENMT: Mucous membranes are dry.  Neck: normal, supple Respiratory: clear to auscultation bilaterally, no wheezing, no crackles. Normal respiratory effort. No accessory muscle use.  Cardiovascular: Regular rate and rhythm, no murmurs / rubs / gallops. No LE edema.  Abdomen: Mild discomfort in the lower quadrants but no guarding or rebound Musculoskeletal: no clubbing / cyanosis.  Skin: no rashes Neurologic: Overall weak but moves all 4 extremities independently.  Does not always follow commands   Data Reviewed: I have independently reviewed following labs and imaging studies   CBC: Recent Labs  Lab 04/26/19 1905 04/27/19 0412  WBC 8.9 9.3  NEUTROABS 7.7  --   HGB 11.5* 11.2*  HCT 36.6 33.5*  MCV 89.9 85.7  PLT 219 [id number]   Basic Metabolic Panel: Recent Labs  Lab 04/26/19 1905 04/27/19 0412  NA 141 139  K 3.8 4.1  CL 104 102  CO2 25 27  GLUCOSE 188* 183*  BUN 14 13  CREATININE 0.78 0.67  CALCIUM 9.5 9.3   Liver Function Tests: Recent Labs  Lab 04/26/19 1905  AST 21  ALT 13  ALKPHOS 61  BILITOT 0.6  PROT 6.5  ALBUMIN 3.8   Coagulation Profile: No results for input(s): INR, PROTIME in the last 168 hours. HbA1C: No results for input(s): HGBA1C in the last 72 hours. CBG: No results for input(s): GLUCAP in the last 168 hours.  Recent Results (from the past 240 hour(s))  SARS CORONAVIRUS 2 (TAT 6-24 HRS) Nasopharyngeal Nasopharyngeal Swab     Status: None   Collection Time: 04/26/19  7:05 PM   Specimen: Nasopharyngeal Swab  Result Value Ref Range Status   SARS Coronavirus 2 NEGATIVE NEGATIVE Final    Comment: (NOTE) SARS-CoV-2 target nucleic acids are NOT DETECTED. The  SARS-CoV-2 RNA is generally detectable in upper and lower respiratory specimens during the acute phase of infection. Negative results do not preclude SARS-CoV-2 infection, do not rule out co-infections with other pathogens, and should not be used as the sole basis for treatment or other patient management decisions. Negative results must be combined with clinical observations, patient history, and epidemiological information. The expected result is Negative. Fact Sheet for Patients: SugarRoll.be Fact Sheet for Healthcare Providers: https://www.woods-mathews.com/ This test is not yet approved or cleared by the [place] FDA and  has been authorized for detection and/or diagnosis of SARS-CoV-2 by FDA under an Emergency Use Authorization (EUA). This EUA will remain  in effect (meaning this test can be used) for the duration of the COVID-19 declaration under Section 56 4(b)(1) of the Act, 21 U.S.C. section 360bbb-3(b)(1), unless the authorization is terminated or revoked sooner. Performed at [facility], [place] [address] [place], [place] [address]   Culture, blood (routine x 2) Call MD if unable to obtain prior to antibiotics being given  Status: None (Preliminary result)   Collection Time: 04/27/19  4:12 AM   Specimen: BLOOD  Result Value Ref Range Status   Specimen Description BLOOD RIGHT ANTECUBITAL  Final   Special Requests   Final    BOTTLES DRAWN AEROBIC AND ANAEROBIC Blood Culture results may not be optimal due to an excessive volume of blood received in culture bottles   Culture   Final    NO GROWTH <12 HOURS Performed at [facility], [address], [place], [place] [address]    Report Status PENDING  Incomplete  Culture, blood (routine x 2) Call MD if unable to obtain prior to antibiotics being given     Status: None (Preliminary result)   Collection Time: 04/27/19  4:12 AM   Specimen: BLOOD  Result Value Ref  Range Status   Specimen Description BLOOD RIGHT HAND  Final   Special Requests   Final    BOTTLES DRAWN AEROBIC AND ANAEROBIC Blood Culture results may not be optimal due to an excessive volume of blood received in culture bottles   Culture   Final    NO GROWTH < 12 HOURS Performed at [facility], [address], [place], [place] [address]    Report Status PENDING  Incomplete     Radiology Studies: Dg Chest 1 View  Result Date: 04/26/2019 CLINICAL DATA:  Left hip pain and leg pain. EXAM: CHEST  1 VIEW COMPARISON:  Chest x-ray 12/04/2018 FINDINGS: Heart size remains enlarged following median sternotomy with signs of aortic atherosclerosis. Increased interstitial markings bilaterally with more pronounced opacity in the left chest, left upper lobe. No signs of pleural effusion. Lungs are less hyperinflated than before. No acute bone finding. IMPRESSION: Findings that are suspicious for atypical pneumonia versus asymmetric pulmonary edema. Cardiomegaly and changes of sternotomy as before. COPD Electronically Signed   By: Zetta Bills M.D.   On: 04/26/2019 19:34   Ct Chest W Contrast  Result Date: 04/26/2019 CLINICAL DATA:  Interstitial lung disease. New infiltrates on chest x-ray. Abdominal distension with concern for bowel obstruction. EXAM: CT CHEST, ABDOMEN, AND PELVIS WITH CONTRAST TECHNIQUE: Multidetector CT imaging of the chest, abdomen and pelvis was performed following the standard protocol during bolus administration of intravenous contrast. CONTRAST:  51mL OMNIPAQUE IOHEXOL 300 MG/ML  SOLN COMPARISON:  CT chest dated December 04, 2018. CT of the pelvis dated 05/24/2013 FINDINGS: CT CHEST FINDINGS Cardiovascular: There is no evidence for large centrally located pulmonary embolism. Detection of smaller segmental and subsegmental pulmonary emboli is limited by technique and contrast bolus timing. Advanced aortic calcifications are noted. The main pulmonary artery is dilated. The main  right and left pulmonary arteries are dilated. The heart is not significantly enlarged. There is no pericardial effusion. Mediastinum/Nodes: --No mediastinal or hilar lymphadenopathy. --No axillary lymphadenopathy. --No supraclavicular lymphadenopathy. --Normal thyroid gland. --The esophagus is unremarkable Lungs/Pleura: There are multifocal areas of consolidation, greatest within the left upper and bilateral lower lobes. There is scarring versus atelectasis at the lung bases. There is no pneumothorax. There are trace bilateral pleural effusions. Musculoskeletal: No chest wall abnormality. No acute or significant osseous findings. CT ABDOMEN PELVIS FINDINGS Hepatobiliary: The liver is normal. Normal gallbladder.There is no biliary ductal dilation. Pancreas: Normal contours without ductal dilatation. No peripancreatic fluid collection. Spleen: No splenic laceration or hematoma. Adrenals/Urinary Tract: --Adrenal glands: No adrenal hemorrhage. --Right kidney/ureter: No hydronephrosis or perinephric hematoma. --Left kidney/ureter: No hydronephrosis or perinephric hematoma. --Urinary bladder: The urinary bladder is moderately distended. Stomach/Bowel: --Stomach/Duodenum: The  stomach is distended with an air-fluid level. --Small bowel: There are dilated loops of small bowel in the low midline abdomen and pelvis measuring up to approximately 3.8 cm in diameter. This appears to be secondary to an obturator hernia through the left obturator foramen. --Colon: Rectosigmoid diverticulosis without acute inflammation. --Appendix: Not visualized. No right lower quadrant inflammation or free fluid. Vascular/Lymphatic: Atherosclerotic calcification is present within the non-aneurysmal abdominal aorta, without hemodynamically significant stenosis. --No retroperitoneal lymphadenopathy. --No mesenteric lymphadenopathy. --No pelvic or inguinal lymphadenopathy. Reproductive: Status post hysterectomy. No adnexal mass. Other: There is a  small amount of free fluid in the patient's abdomen and pelvis. There is no significant free air. Musculoskeletal. No acute displaced fractures. IMPRESSION: 1. Small-bowel obstruction likely secondary to an obturator hernia through the left obturator foramen. 2. Multifocal pneumonia (viral or bacterial), greatest within the left upper lobe. 3. Trace bilateral pleural effusions. 4. Moderately distended urinary bladder. 5. Sigmoid diverticulosis without CT evidence for diverticulitis. 6. Aortic atherosclerosis. Aortic Atherosclerosis (ICD10-I70.0). Electronically Signed   By: Constance Holster M.D.   On: 04/26/2019 23:34   Ct Abdomen Pelvis W Contrast  Result Date: 04/26/2019 CLINICAL DATA:  Interstitial lung disease. New infiltrates on chest x-ray. Abdominal distension with concern for bowel obstruction. EXAM: CT CHEST, ABDOMEN, AND PELVIS WITH CONTRAST TECHNIQUE: Multidetector CT imaging of the chest, abdomen and pelvis was performed following the standard protocol during bolus administration of intravenous contrast. CONTRAST:  5mL OMNIPAQUE IOHEXOL 300 MG/ML  SOLN COMPARISON:  CT chest dated December 04, 2018. CT of the pelvis dated 05/24/2013 FINDINGS: CT CHEST FINDINGS Cardiovascular: There is no evidence for large centrally located pulmonary embolism. Detection of smaller segmental and subsegmental pulmonary emboli is limited by technique and contrast bolus timing. Advanced aortic calcifications are noted. The main pulmonary artery is dilated. The main right and left pulmonary arteries are dilated. The heart is not significantly enlarged. There is no pericardial effusion. Mediastinum/Nodes: --No mediastinal or hilar lymphadenopathy. --No axillary lymphadenopathy. --No supraclavicular lymphadenopathy. --Normal thyroid gland. --The esophagus is unremarkable Lungs/Pleura: There are multifocal areas of consolidation, greatest within the left upper and bilateral lower lobes. There is scarring versus atelectasis at  the lung bases. There is no pneumothorax. There are trace bilateral pleural effusions. Musculoskeletal: No chest wall abnormality. No acute or significant osseous findings. CT ABDOMEN PELVIS FINDINGS Hepatobiliary: The liver is normal. Normal gallbladder.There is no biliary ductal dilation. Pancreas: Normal contours without ductal dilatation. No peripancreatic fluid collection. Spleen: No splenic laceration or hematoma. Adrenals/Urinary Tract: --Adrenal glands: No adrenal hemorrhage. --Right kidney/ureter: No hydronephrosis or perinephric hematoma. --Left kidney/ureter: No hydronephrosis or perinephric hematoma. --Urinary bladder: The urinary bladder is moderately distended. Stomach/Bowel: --Stomach/Duodenum: The stomach is distended with an air-fluid level. --Small bowel: There are dilated loops of small bowel in the low midline abdomen and pelvis measuring up to approximately 3.8 cm in diameter. This appears to be secondary to an obturator hernia through the left obturator foramen. --Colon: Rectosigmoid diverticulosis without acute inflammation. --Appendix: Not visualized. No right lower quadrant inflammation or free fluid. Vascular/Lymphatic: Atherosclerotic calcification is present within the non-aneurysmal abdominal aorta, without hemodynamically significant stenosis. --No retroperitoneal lymphadenopathy. --No mesenteric lymphadenopathy. --No pelvic or inguinal lymphadenopathy. Reproductive: Status post hysterectomy. No adnexal mass. Other: There is a small amount of free fluid in the patient's abdomen and pelvis. There is no significant free air. Musculoskeletal. No acute displaced fractures. IMPRESSION: 1. Small-bowel obstruction likely secondary to an obturator hernia through the left obturator foramen. 2. Multifocal pneumonia (viral or  bacterial), greatest within the left upper lobe. 3. Trace bilateral pleural effusions. 4. Moderately distended urinary bladder. 5. Sigmoid diverticulosis without CT evidence  for diverticulitis. 6. Aortic atherosclerosis. Aortic Atherosclerosis (ICD10-I70.0). Electronically Signed   By: Constance Holster M.D.   On: 04/26/2019 23:34   US Venous Img Lower Unilateral Left (dvt)  Result Date: 04/27/2019 CLINICAL DATA:  Left lower extremity pain EXAM: LEFT LOWER EXTREMITY VENOUS DOPPLER ULTRASOUND TECHNIQUE: Gray-scale sonography with graded compression, as well as color Doppler and duplex ultrasound were performed to evaluate the lower extremity deep venous systems from the level of the common femoral vein and including the common femoral, femoral, profunda femoral, popliteal and calf veins including the posterior tibial, peroneal and gastrocnemius veins when visible. The superficial great saphenous vein was also interrogated. Spectral Doppler was utilized to evaluate flow at rest and with distal augmentation maneuvers in the common femoral, femoral and popliteal veins. COMPARISON:  None. FINDINGS: Contralateral Common Femoral Vein: Not assessed. Common Femoral Vein: No evidence of thrombus. Normal compressibility, respiratory phasicity and response to augmentation. Saphenofemoral Junction: No evidence of thrombus. Normal compressibility and flow on color Doppler imaging. Profunda Femoral Vein: No evidence of thrombus. Normal compressibility and flow on color Doppler imaging. Femoral Vein: No evidence of thrombus. Normal compressibility, respiratory phasicity and response to augmentation. Popliteal Vein: No evidence of thrombus. Normal compressibility, respiratory phasicity and response to augmentation. Calf Veins: No evidence of thrombus. Normal compressibility and flow on color Doppler imaging. Superficial Great Saphenous Vein: No evidence of thrombus. Normal compressibility. Venous Reflux:  None. Other Findings:  None. IMPRESSION: No evidence of deep venous thrombosis. Electronically Signed   By: Rolm Baptise M.D.   On: 04/27/2019 02:52   US Arterial Lower Extremity Duplex Left  (non-abi)  Result Date: 04/27/2019 CLINICAL DATA:  Left lower extremity pain, peripheral vascular disease, hypertension, diabetes and coronary artery disease. EXAM: LEFT LOWER EXTREMITY ARTERIAL DUPLEX SCAN TECHNIQUE: Gray-scale sonography as well as color Doppler and duplex ultrasound was performed to evaluate the lower extremity arteries including the common, superficial and profunda femoral arteries, popliteal artery and calf arteries. COMPARISON:  None. FINDINGS: Left lower Extremity Inflow: The common femoral artery demonstrates moderate eccentric calcified plaque without visible significant luminal stenosis. Arterial waveform is triphasic. Outflow: Normal velocities. SFA waveform is biphasic. Profunda femoral waveform is triphasic. Calcified plaque is seen throughout the SFA. Popliteal artery is patent with biphasic waveform. Mild calcified plaque is present in the popliteal artery. Runoff: Limited tibial artery evaluation with anterior and posterior tibial arteries demonstrating normal velocities, biphasic waveforms and scattered plaque. posterior and anterior tibial arterial waveforms and velocities. Vessels are patent to the ankle. IMPRESSION: Diffuse atherosclerosis of left lower extremity arteries without focal occlusion or abnormal velocities detected by duplex ultrasound. Electronically Signed   By: Aletta Edouard M.D.   On: 04/27/2019 09:03   Dg Abd Portable 2 Views  Result Date: 04/26/2019 CLINICAL DATA:  Dilated bowel for further workup EXAM: PORTABLE ABDOMEN - 2 VIEW COMPARISON:  Hip radiographs 04/26/2019 FINDINGS: Again we demonstrate a dilated loop of small bowel in the right lower quadrant, measuring about 4.1 cm in diameter. There is formed stool in the colon. The upright abdomen radiograph excludes the pelvis, the dilated loop is seen but month certain if there are associated air-fluid levels because the pelvis is excluded. Aortic and splenic artery atherosclerosis. Small vessel stent  in the abdomen, possibly of the left renal artery. Left hip hemiarthroplasty. Bony demineralization. The upright view includes some of  the chest and demonstrates indistinct left perihilar airspace opacity as well as bilateral interstitial opacity in the lungs. Prior CABG noted. IMPRESSION: 1. Abnormally dilated loop of small bowel in the right lower quadrant, similar to prior appearance the. The appearance is abnormal but nonspecific and could represent local ileus or early obstruction. However, there is formed stool in the colon. 2. Airspace opacity in the left perihilar region with bilateral interstitial accentuation, possibilities include asymmetric edema or pneumonia. 3. Mild enlargement of the cardiopericardial silhouette 4. Atherosclerosis. 5. Bony demineralization. Electronically Signed   By: Van Clines M.D.   On: 04/26/2019 20:13   Dg Hip Unilat W Or Wo Pelvis 2-3 Views Left  Result Date: 04/26/2019 CLINICAL DATA:  Left hip and leg pain EXAM: DG HIP (WITH OR WITHOUT PELVIS) 2-3V LEFT COMPARISON:  Femoral exam of the same date, postoperative exam of 11/29/2018 FINDINGS: Signs of left hip arthroplasty. No signs of periprosthetic fracture. Osteopenia with minimal degenerative changes in the right hip. No signs of pelvic fracture with evidence of calcified atherosclerotic changes overlying the pelvis. Focal bowel dilation in the right lower quadrant, small bowel loops up to 4 cm are incidentally noted. There is evidence of some stool and gas in the rectum. IMPRESSION: 1. Left hip arthroplasty without signs of acute fracture or dislocation. 2. Focal bowel dilation in the right lower quadrant, small bowel loops up to 4 cm are incidentally noted, consider dedicated abdominal plain film to exclude ileus versus early small bowel obstruction. 3. Signs of calcified atherosclerotic changes in the pelvis. Electronically Signed   By: Zetta Bills M.D.   On: 04/26/2019 19:37   Dg Femur Min 2 Views  Left  Result Date: 04/26/2019 CLINICAL DATA:  Leg pain, left leg. EXAM: LEFT FEMUR 2 VIEWS COMPARISON:  11/29/2018 FINDINGS: Post left hip arthroplasty without signs of periprosthetic fracture. Signs of vascular calcification along superficial femoral artery course. Osteopenia. IMPRESSION: Post left hip arthroplasty. No acute abnormality. Electronically Signed   By: Zetta Bills M.D.   On: 04/26/2019 19:30   Marzetta Board, MD, PhD [facility]  Between 7 am - 7 pm I am available Contact me via Amion or Securechat  Between 7 pm - 7 am I am not available Contact night coverage MD/APP via [employer]

## 2019-04-27 NOTE — Consult Note (Signed)
Surgical Consultation   CC: Marzetta Board MD  HPI: This is a 85 year old woman with multiple significant medical problems as listed below who presented to [facility] ER on 11/13 around 6:30pm with LLE pain.  She was noted to be hypoxic with a pulse ox of 78% on room air which improved to 100% with nasal cannula, she did endorse mild cough but no significant dyspnea or wheezing.  Denies any fever, chills, nausea vomiting or abdominal pain.  CT completed around 11:30pm yesterday demonstrated multifocal pneumonia and SBO secondary to an incarcerated left obturator hernia.  It appears as though surgery was consulted at that time, and the patient was evaluated in the late morning today and plans were made to proceed with laparotomy for reduction and repair of the incarcerated obturator hernia.  I was contacted by Dr. Cruzita Lederer at 4:45pm requesting transfer of the patient's care to [facility] hospitalist team with surgical consultation, as there were well-founded concerns the patient would require ICU postoperative care and this is not available at [facility].  While arrangements for a bed here at [facility] had been made and plans in place to expedite transfer, she departed from [place] just after 8 PM and has just arrived to the preop holding area at [facility]  Currently, the patient endorses left thigh pain.  This is aggravated by movement.  Not really having much abdominal pain.  In addition to multifocal pneumonia, she has a history of coronary artery disease, myocardial infarction status post CABG, chronic systolic congestive heart failure with an ejection fraction of 20 to 25% on echo in May 2020 and has a mildly elevated troponin on this admission.  History of hypertension and presented with hypertensive urgency.  She has a history of stroke, carotid artery stenosis and is on Plavix, additionally with peripheral vascular disease and renal artery stenosis, significant aortic atherosclerosis  History  of diabetes  History of dementia, lives at home with adult children and grandchildren who help her.  She walks with the support of a walker.  Osteoporosis and is severely underweight with a BMI of 15.2   Abdominal surgical history includes hysterectomy for uterine cancer.   Allergies  Allergen Reactions   Penicillins Rash    Has patient had a PCN reaction causing immediate rash, facial/tongue/throat swelling, SOB or lightheadedness with hypotension: No Has patient had a PCN reaction causing severe rash involving mucus membranes or skin necrosis: No Has patient had a PCN reaction that required hospitalization: No Has patient had a PCN reaction occurring within the last 10 years: No If all of the above answers are "NO", then may proceed with Cephalosporin use.    Past Medical History:  Diagnosis Date   Carotid artery stenosis    Carotid artery stenosis    Collagen vascular disease (HCC)    Coronary artery disease    Diabetes mellitus without complication (HCC)    diet control   GERD (gastroesophageal reflux disease)    Hypertension    Myocardial infarction ([place])    Osteoporosis    Peripheral vascular disease (HCC)    Pneumonia    Renal artery stenosis (HCC)    Status post double vessel coronary artery bypass 2005   Stroke [facility])    Uterine cancer [facility])     Past Surgical History:  Procedure Laterality Date   ABDOMINAL HYSTERECTOMY     CAROTID ENDARTERECTOMY     CATARACT EXTRACTION W/PHACO Right 09/28/2015   Procedure: CATARACT EXTRACTION PHACO AND INTRAOCULAR LENS PLACEMENT ([place]);  Surgeon:  Estill Cotta, MD;  Location: [facility] ORS;  Service: Ophthalmology;  Laterality: Right;  [place] 041:18 AP% 28.6 CDE 114.94 fluid pack lot # [id number]   CORONARY ARTERY BYPASS GRAFT     HIP ARTHROPLASTY Left 11/29/2018   Procedure: ARTHROPLASTY BIPOLAR HIP (HEMIARTHROPLASTY);  Surgeon: Corky Mull, MD;  Location: [facility] ORS;  Service: Orthopedics;  Laterality: Left;    PERIPHERAL VASCULAR CATHETERIZATION Right 01/22/2015   Procedure: Carotid PTA/Stent Intervention;  Surgeon: Katha Cabal, MD;  Location: [place] CV LAB;  Service: Cardiovascular;  Laterality: Right;   uterine cancer surgery      Family History  Problem Relation Age of Onset   Lung cancer Daughter     Social History   Socioeconomic History   Marital status: Widowed    Spouse name: Not on file   Number of children: Not on file   Years of education: Not on file   Highest education level: Not on file  Occupational History   Not on file  Social Needs   Financial resource strain: Not hard at all   Food insecurity    Worry: Never true    Inability: Never true   Transportation needs    Medical: No    Non-medical: No  Tobacco Use   Smoking status: Former Smoker   Smokeless tobacco: Never Used  Substance and Sexual Activity   Alcohol use: No   Drug use: No   Sexual activity: Not on file  Lifestyle   Physical activity    Days per week: 0 days    Minutes per session: 0 min   Stress: Not at all  Relationships   Social connections    Talks on phone: More than three times a week    Gets together: More than three times a week    Attends religious service: Not on file    Active member of club or organization: Not on file    Attends meetings of clubs or organizations: Not on file    Relationship status: Widowed  Other Topics Concern   Not on file  Social History Narrative   Not on file    No current facility-administered medications on file prior to encounter.    Current Outpatient Medications on File Prior to Encounter  Medication Sig Dispense Refill   clopidogrel (PLAVIX) 75 MG tablet Take 75 mg by mouth daily.     feeding supplement, ENSURE ENLIVE, (ENSURE ENLIVE) LIQD Take 237 mLs by mouth 2 (two) times daily between meals. 237 mL 12   furosemide (LASIX) 20 MG tablet Take 20 mg by mouth daily.     gabapentin (NEURONTIN) 300 MG capsule  Take 600 mg by mouth at bedtime.      lisinopril (ZESTRIL) 5 MG tablet Take 5 mg by mouth daily.     metoprolol succinate (TOPROL-XL) 25 MG 24 hr tablet Take 0.5 tablets (12.5 mg total) by mouth daily. (Patient taking differently: Take 12.5 mg by mouth every evening. ) 15 tablet 1   simvastatin (ZOCOR) 40 MG tablet Take 40 mg by mouth daily.   5    Review of Systems: a complete, 10pt review of systems was completed with pertinent positives and negatives as documented in the HPI  Physical Exam: There were no vitals filed for this visit. Gen: She is alert, cooperative, not in acute distress.  Very frail. Head: normocephalic, atraumatic Eyes: extraocular motions intact, anicteric.  Neck: supple without mass or thyromegaly Chest: unlabored respirations, symmetrical air entry Cardiovascular: RRR  Abdomen:  soft, mildly distended, minimally tender in the lower fields.  Positive howship romberg sign. No mass or organomegaly.  Well-healed low midline vertical scar. Extremities: warm, without edema, no deformities Neuro: Moves all extremities, follows commands, somewhat confused Psych: appropriate mood and affect, decreased insight Skin: warm and dry.    CBC Latest Ref Rng & Units 04/27/2019 04/26/2019 12/05/2018  WBC 4.0 - 10.5 K/uL 9.3 8.9 6.7  Hemoglobin 12.0 - 15.0 g/dL 11.2(L) 11.5(L) 10.4(L)  Hematocrit 36.0 - 46.0 % 33.5(L) 36.6 32.5(L)  Platelets 150 - 400 K/uL 203 219 245    CMP Latest Ref Rng & Units 04/27/2019 04/26/2019 12/05/2018  Glucose 70 - 99 mg/dL 183(H) 188(H) 133(H)  BUN 8 - 23 mg/dL 13 14 10   Creatinine 0.44 - 1.00 mg/dL 0.67 0.78 0.52  Sodium 135 - 145 mmol/L 139 141 138  Potassium 3.5 - 5.1 mmol/L 4.1 3.8 4.5  Chloride 98 - 111 mmol/L 102 104 98  CO2 22 - 32 mmol/L 27 25 32  Calcium 8.9 - 10.3 mg/dL 9.3 9.5 9.2  Total Protein 6.5 - 8.1 g/dL - 6.5 -  Total Bilirubin 0.3 - 1.2 mg/dL - 0.6 -  Alkaline Phos 38 - 126 U/L - 61 -  AST 15 - 41 U/L - 21 -  ALT 0 - 44  U/L - 13 -    Lab Results  Component Value Date   INR 1.1 12/05/2018   INR 1.1 10/17/2018   INR 1.03 05/07/2018    Imaging: Dg Chest 1 View  Result Date: 04/26/2019 CLINICAL DATA:  Left hip pain and leg pain. EXAM: CHEST  1 VIEW COMPARISON:  Chest x-ray 12/04/2018 FINDINGS: Heart size remains enlarged following median sternotomy with signs of aortic atherosclerosis. Increased interstitial markings bilaterally with more pronounced opacity in the left chest, left upper lobe. No signs of pleural effusion. Lungs are less hyperinflated than before. No acute bone finding. IMPRESSION: Findings that are suspicious for atypical pneumonia versus asymmetric pulmonary edema. Cardiomegaly and changes of sternotomy as before. COPD Electronically Signed   By: Zetta Bills M.D.   On: 04/26/2019 19:34   Ct Chest W Contrast  Result Date: 04/26/2019 CLINICAL DATA:  Interstitial lung disease. New infiltrates on chest x-ray. Abdominal distension with concern for bowel obstruction. EXAM: CT CHEST, ABDOMEN, AND PELVIS WITH CONTRAST TECHNIQUE: Multidetector CT imaging of the chest, abdomen and pelvis was performed following the standard protocol during bolus administration of intravenous contrast. CONTRAST:  83mL OMNIPAQUE IOHEXOL 300 MG/ML  SOLN COMPARISON:  CT chest dated December 04, 2018. CT of the pelvis dated 05/24/2013 FINDINGS: CT CHEST FINDINGS Cardiovascular: There is no evidence for large centrally located pulmonary embolism. Detection of smaller segmental and subsegmental pulmonary emboli is limited by technique and contrast bolus timing. Advanced aortic calcifications are noted. The main pulmonary artery is dilated. The main right and left pulmonary arteries are dilated. The heart is not significantly enlarged. There is no pericardial effusion. Mediastinum/Nodes: --No mediastinal or hilar lymphadenopathy. --No axillary lymphadenopathy. --No supraclavicular lymphadenopathy. --Normal thyroid gland. --The  esophagus is unremarkable Lungs/Pleura: There are multifocal areas of consolidation, greatest within the left upper and bilateral lower lobes. There is scarring versus atelectasis at the lung bases. There is no pneumothorax. There are trace bilateral pleural effusions. Musculoskeletal: No chest wall abnormality. No acute or significant osseous findings. CT ABDOMEN PELVIS FINDINGS Hepatobiliary: The liver is normal. Normal gallbladder.There is no biliary ductal dilation. Pancreas: Normal contours without ductal dilatation. No peripancreatic fluid collection. Spleen: No splenic  laceration or hematoma. Adrenals/Urinary Tract: --Adrenal glands: No adrenal hemorrhage. --Right kidney/ureter: No hydronephrosis or perinephric hematoma. --Left kidney/ureter: No hydronephrosis or perinephric hematoma. --Urinary bladder: The urinary bladder is moderately distended. Stomach/Bowel: --Stomach/Duodenum: The stomach is distended with an air-fluid level. --Small bowel: There are dilated loops of small bowel in the low midline abdomen and pelvis measuring up to approximately 3.8 cm in diameter. This appears to be secondary to an obturator hernia through the left obturator foramen. --Colon: Rectosigmoid diverticulosis without acute inflammation. --Appendix: Not visualized. No right lower quadrant inflammation or free fluid. Vascular/Lymphatic: Atherosclerotic calcification is present within the non-aneurysmal abdominal aorta, without hemodynamically significant stenosis. --No retroperitoneal lymphadenopathy. --No mesenteric lymphadenopathy. --No pelvic or inguinal lymphadenopathy. Reproductive: Status post hysterectomy. No adnexal mass. Other: There is a small amount of free fluid in the patient's abdomen and pelvis. There is no significant free air. Musculoskeletal. No acute displaced fractures. IMPRESSION: 1. Small-bowel obstruction likely secondary to an obturator hernia through the left obturator foramen. 2. Multifocal pneumonia  (viral or bacterial), greatest within the left upper lobe. 3. Trace bilateral pleural effusions. 4. Moderately distended urinary bladder. 5. Sigmoid diverticulosis without CT evidence for diverticulitis. 6. Aortic atherosclerosis. Aortic Atherosclerosis (ICD10-I70.0). Electronically Signed   By: Constance Holster M.D.   On: 04/26/2019 23:34   Ct Abdomen Pelvis W Contrast  Result Date: 04/26/2019 CLINICAL DATA:  Interstitial lung disease. New infiltrates on chest x-ray. Abdominal distension with concern for bowel obstruction. EXAM: CT CHEST, ABDOMEN, AND PELVIS WITH CONTRAST TECHNIQUE: Multidetector CT imaging of the chest, abdomen and pelvis was performed following the standard protocol during bolus administration of intravenous contrast. CONTRAST:  76mL OMNIPAQUE IOHEXOL 300 MG/ML  SOLN COMPARISON:  CT chest dated December 04, 2018. CT of the pelvis dated 05/24/2013 FINDINGS: CT CHEST FINDINGS Cardiovascular: There is no evidence for large centrally located pulmonary embolism. Detection of smaller segmental and subsegmental pulmonary emboli is limited by technique and contrast bolus timing. Advanced aortic calcifications are noted. The main pulmonary artery is dilated. The main right and left pulmonary arteries are dilated. The heart is not significantly enlarged. There is no pericardial effusion. Mediastinum/Nodes: --No mediastinal or hilar lymphadenopathy. --No axillary lymphadenopathy. --No supraclavicular lymphadenopathy. --Normal thyroid gland. --The esophagus is unremarkable Lungs/Pleura: There are multifocal areas of consolidation, greatest within the left upper and bilateral lower lobes. There is scarring versus atelectasis at the lung bases. There is no pneumothorax. There are trace bilateral pleural effusions. Musculoskeletal: No chest wall abnormality. No acute or significant osseous findings. CT ABDOMEN PELVIS FINDINGS Hepatobiliary: The liver is normal. Normal gallbladder.There is no biliary ductal  dilation. Pancreas: Normal contours without ductal dilatation. No peripancreatic fluid collection. Spleen: No splenic laceration or hematoma. Adrenals/Urinary Tract: --Adrenal glands: No adrenal hemorrhage. --Right kidney/ureter: No hydronephrosis or perinephric hematoma. --Left kidney/ureter: No hydronephrosis or perinephric hematoma. --Urinary bladder: The urinary bladder is moderately distended. Stomach/Bowel: --Stomach/Duodenum: The stomach is distended with an air-fluid level. --Small bowel: There are dilated loops of small bowel in the low midline abdomen and pelvis measuring up to approximately 3.8 cm in diameter. This appears to be secondary to an obturator hernia through the left obturator foramen. --Colon: Rectosigmoid diverticulosis without acute inflammation. --Appendix: Not visualized. No right lower quadrant inflammation or free fluid. Vascular/Lymphatic: Atherosclerotic calcification is present within the non-aneurysmal abdominal aorta, without hemodynamically significant stenosis. --No retroperitoneal lymphadenopathy. --No mesenteric lymphadenopathy. --No pelvic or inguinal lymphadenopathy. Reproductive: Status post hysterectomy. No adnexal mass. Other: There is a small amount of free fluid in the  patient's abdomen and pelvis. There is no significant free air. Musculoskeletal. No acute displaced fractures. IMPRESSION: 1. Small-bowel obstruction likely secondary to an obturator hernia through the left obturator foramen. 2. Multifocal pneumonia (viral or bacterial), greatest within the left upper lobe. 3. Trace bilateral pleural effusions. 4. Moderately distended urinary bladder. 5. Sigmoid diverticulosis without CT evidence for diverticulitis. 6. Aortic atherosclerosis. Aortic Atherosclerosis (ICD10-I70.0). Electronically Signed   By: Constance Holster M.D.   On: 04/26/2019 23:34   US Venous Img Lower Unilateral Left (dvt)  Result Date: 04/27/2019 CLINICAL DATA:  Left lower extremity pain EXAM:  LEFT LOWER EXTREMITY VENOUS DOPPLER ULTRASOUND TECHNIQUE: Gray-scale sonography with graded compression, as well as color Doppler and duplex ultrasound were performed to evaluate the lower extremity deep venous systems from the level of the common femoral vein and including the common femoral, femoral, profunda femoral, popliteal and calf veins including the posterior tibial, peroneal and gastrocnemius veins when visible. The superficial great saphenous vein was also interrogated. Spectral Doppler was utilized to evaluate flow at rest and with distal augmentation maneuvers in the common femoral, femoral and popliteal veins. COMPARISON:  None. FINDINGS: Contralateral Common Femoral Vein: Not assessed. Common Femoral Vein: No evidence of thrombus. Normal compressibility, respiratory phasicity and response to augmentation. Saphenofemoral Junction: No evidence of thrombus. Normal compressibility and flow on color Doppler imaging. Profunda Femoral Vein: No evidence of thrombus. Normal compressibility and flow on color Doppler imaging. Femoral Vein: No evidence of thrombus. Normal compressibility, respiratory phasicity and response to augmentation. Popliteal Vein: No evidence of thrombus. Normal compressibility, respiratory phasicity and response to augmentation. Calf Veins: No evidence of thrombus. Normal compressibility and flow on color Doppler imaging. Superficial Great Saphenous Vein: No evidence of thrombus. Normal compressibility. Venous Reflux:  None. Other Findings:  None. IMPRESSION: No evidence of deep venous thrombosis. Electronically Signed   By: Rolm Baptise M.D.   On: 04/27/2019 02:52   US Arterial Lower Extremity Duplex Left (non-abi)  Result Date: 04/27/2019 CLINICAL DATA:  Left lower extremity pain, peripheral vascular disease, hypertension, diabetes and coronary artery disease. EXAM: LEFT LOWER EXTREMITY ARTERIAL DUPLEX SCAN TECHNIQUE: Gray-scale sonography as well as color Doppler and duplex  ultrasound was performed to evaluate the lower extremity arteries including the common, superficial and profunda femoral arteries, popliteal artery and calf arteries. COMPARISON:  None. FINDINGS: Left lower Extremity Inflow: The common femoral artery demonstrates moderate eccentric calcified plaque without visible significant luminal stenosis. Arterial waveform is triphasic. Outflow: Normal velocities. SFA waveform is biphasic. Profunda femoral waveform is triphasic. Calcified plaque is seen throughout the SFA. Popliteal artery is patent with biphasic waveform. Mild calcified plaque is present in the popliteal artery. Runoff: Limited tibial artery evaluation with anterior and posterior tibial arteries demonstrating normal velocities, biphasic waveforms and scattered plaque. posterior and anterior tibial arterial waveforms and velocities. Vessels are patent to the ankle. IMPRESSION: Diffuse atherosclerosis of left lower extremity arteries without focal occlusion or abnormal velocities detected by duplex ultrasound. Electronically Signed   By: Aletta Edouard M.D.   On: 04/27/2019 09:03   Dg Abd Portable 2 Views  Result Date: 04/26/2019 CLINICAL DATA:  Dilated bowel for further workup EXAM: PORTABLE ABDOMEN - 2 VIEW COMPARISON:  Hip radiographs 04/26/2019 FINDINGS: Again we demonstrate a dilated loop of small bowel in the right lower quadrant, measuring about 4.1 cm in diameter. There is formed stool in the colon. The upright abdomen radiograph excludes the pelvis, the dilated loop is seen but month certain if there are associated air-fluid  levels because the pelvis is excluded. Aortic and splenic artery atherosclerosis. Small vessel stent in the abdomen, possibly of the left renal artery. Left hip hemiarthroplasty. Bony demineralization. The upright view includes some of the chest and demonstrates indistinct left perihilar airspace opacity as well as bilateral interstitial opacity in the lungs. Prior CABG noted.  IMPRESSION: 1. Abnormally dilated loop of small bowel in the right lower quadrant, similar to prior appearance the. The appearance is abnormal but nonspecific and could represent local ileus or early obstruction. However, there is formed stool in the colon. 2. Airspace opacity in the left perihilar region with bilateral interstitial accentuation, possibilities include asymmetric edema or pneumonia. 3. Mild enlargement of the cardiopericardial silhouette 4. Atherosclerosis. 5. Bony demineralization. Electronically Signed   By: Van Clines M.D.   On: 04/26/2019 20:13   Dg Hip Unilat W Or Wo Pelvis 2-3 Views Left  Result Date: 04/26/2019 CLINICAL DATA:  Left hip and leg pain EXAM: DG HIP (WITH OR WITHOUT PELVIS) 2-3V LEFT COMPARISON:  Femoral exam of the same date, postoperative exam of 11/29/2018 FINDINGS: Signs of left hip arthroplasty. No signs of periprosthetic fracture. Osteopenia with minimal degenerative changes in the right hip. No signs of pelvic fracture with evidence of calcified atherosclerotic changes overlying the pelvis. Focal bowel dilation in the right lower quadrant, small bowel loops up to 4 cm are incidentally noted. There is evidence of some stool and gas in the rectum. IMPRESSION: 1. Left hip arthroplasty without signs of acute fracture or dislocation. 2. Focal bowel dilation in the right lower quadrant, small bowel loops up to 4 cm are incidentally noted, consider dedicated abdominal plain film to exclude ileus versus early small bowel obstruction. 3. Signs of calcified atherosclerotic changes in the pelvis. Electronically Signed   By: Zetta Bills M.D.   On: 04/26/2019 19:37   Dg Femur Min 2 Views Left  Result Date: 04/26/2019 CLINICAL DATA:  Leg pain, left leg. EXAM: LEFT FEMUR 2 VIEWS COMPARISON:  11/29/2018 FINDINGS: Post left hip arthroplasty without signs of periprosthetic fracture. Signs of vascular calcification along superficial femoral artery course. Osteopenia.  IMPRESSION: Post left hip arthroplasty. No acute abnormality. Electronically Signed   By: Zetta Bills M.D.   On: 04/26/2019 19:30     A/P: Frail elderly female with multiple medical problems presents with left lower extremity pain likely referred pain from incarcerated obturator hernia noted on CT which is also contributing to SBO.  I discussed with the patient and additionally with her granddaughter by phone the findings, as had already been discussed with them by Dr. Celine Ahr.  I discussed with them that although there is no nonoperative treatment for this, the patient is exceptionally high risk for complications and I quoted them about a 50% chance of any complication or perioperative mortality, and very high likelihood of decreased postoperative function and need for discharge to nursing facility, using the ACS NSQIP risk calculator for reference as indicated below.  The alternative of no surgery and ongoing observation carries risk of bowel strangulation, sepsis and death.  Questions were welcomed and answered to their satisfaction.  We will proceed to the operating room shortly.       Patient Active Problem List   Diagnosis Date Noted   Pressure injury of skin 04/27/2019   Hypoxia 04/26/2019   Respiratory failure, acute ([place]) 12/04/2018   Hip fracture ([place]) 11/28/2018   Goals of care, counseling/discussion    Palliative care by specialist    GERD (gastroesophageal reflux disease)  10/16/2018   CAD (coronary artery disease) 10/16/2018   Atrial fibrillation with RVR ([place]) 10/16/2018   CVA (cerebral vascular accident) ([place]) 05/07/2018   Hypertension 02/02/2018   Diabetes mellitus without complication ([place]) [id number]   Carotid artery stenosis 02/02/2018   Acute CVA (cerebrovascular accident) ([place]) 04/04/2017   Speech problem 04/02/2017   Confusion 04/02/2017   TIA (transient ischemic attack) 04/02/2017       Romana Juniper, MD [facility]  See  AMION to contact on-call provider

## 2019-04-27 NOTE — Telephone Encounter (Signed)
Error

## 2019-04-27 NOTE — Progress Notes (Signed)
Received patient to room ED 32.

## 2019-04-27 NOTE — Progress Notes (Signed)
Report called to [place] @ [place]. Family updated

## 2019-04-28 ENCOUNTER — Inpatient Hospital Stay (HOSPITAL_COMMUNITY): Payer: Medicare Other

## 2019-04-28 ENCOUNTER — Encounter (HOSPITAL_COMMUNITY): Payer: Self-pay | Admitting: Surgery

## 2019-04-28 DIAGNOSIS — I4891 Unspecified atrial fibrillation: Secondary | ICD-10-CM

## 2019-04-28 DIAGNOSIS — R9431 Abnormal electrocardiogram [ECG] [EKG]: Secondary | ICD-10-CM

## 2019-04-28 DIAGNOSIS — R778 Other specified abnormalities of plasma proteins: Secondary | ICD-10-CM

## 2019-04-28 DIAGNOSIS — I160 Hypertensive urgency: Secondary | ICD-10-CM

## 2019-04-28 DIAGNOSIS — I5022 Chronic systolic (congestive) heart failure: Secondary | ICD-10-CM

## 2019-04-28 DIAGNOSIS — Z872 Personal history of diseases of the skin and subcutaneous tissue: Secondary | ICD-10-CM

## 2019-04-28 LAB — CBC
HCT: 30.8 % — ABNORMAL LOW (ref 36.0–46.0)
Hemoglobin: 10.3 g/dL — ABNORMAL LOW (ref 12.0–15.0)
MCH: 29.2 pg (ref 26.0–34.0)
MCHC: 33.4 g/dL (ref 30.0–36.0)
MCV: 87.3 fL (ref 80.0–100.0)
Platelets: 230 10*3/uL (ref 150–400)
RBC: 3.53 MIL/uL — ABNORMAL LOW (ref 3.87–5.11)
RDW: 14.6 % (ref 11.5–15.5)
WBC: 20.2 10*3/uL — ABNORMAL HIGH (ref 4.0–10.5)
nRBC: 0 % (ref 0.0–0.2)

## 2019-04-28 LAB — GLUCOSE, CAPILLARY
Glucose-Capillary: 114 mg/dL — ABNORMAL HIGH (ref 70–99)
Glucose-Capillary: 148 mg/dL — ABNORMAL HIGH (ref 70–99)
Glucose-Capillary: 153 mg/dL — ABNORMAL HIGH (ref 70–99)
Glucose-Capillary: 184 mg/dL — ABNORMAL HIGH (ref 70–99)
Glucose-Capillary: 212 mg/dL — ABNORMAL HIGH (ref 70–99)
Glucose-Capillary: 218 mg/dL — ABNORMAL HIGH (ref 70–99)
Glucose-Capillary: 89 mg/dL (ref 70–99)

## 2019-04-28 LAB — BASIC METABOLIC PANEL
Anion gap: 12 (ref 5–15)
BUN: 17 mg/dL (ref 8–23)
CO2: 26 mmol/L (ref 22–32)
Calcium: 9.5 mg/dL (ref 8.9–10.3)
Chloride: 100 mmol/L (ref 98–111)
Creatinine, Ser: 0.73 mg/dL (ref 0.44–1.00)
GFR calc Af Amer: >60 mL/min (ref >60)
GFR calc non Af Amer: >60 mL/min (ref >60)
Glucose, Bld: 192 mg/dL — ABNORMAL HIGH (ref 70–99)
Potassium: 3.5 mmol/L (ref 3.5–5.1)
Sodium: 138 mmol/L (ref 135–145)

## 2019-04-28 LAB — PREPARE PLATELET PHERESIS
Unit division: 0
Unit division: 0

## 2019-04-28 LAB — TYPE AND SCREEN
ABO/RH(D): A POS
Antibody Screen: NEGATIVE

## 2019-04-28 LAB — MRSA PCR SCREENING: MRSA by PCR: NEGATIVE

## 2019-04-28 LAB — BPAM PLATELET PHERESIS
Blood Product Expiration Date: 202011142359
Blood Product Expiration Date: 202011142359
Unit Type and Rh: 6200
Unit Type and Rh: 6200

## 2019-04-28 LAB — TROPONIN I (HIGH SENSITIVITY)
Troponin I (High Sensitivity): 67 ng/L — ABNORMAL HIGH (ref <18)
Troponin I (High Sensitivity): 70 ng/L — ABNORMAL HIGH (ref <18)

## 2019-04-28 LAB — ABO/RH
ABO/RH(D): A POS
ABO/RH(D): A POS

## 2019-04-28 LAB — ECHOCARDIOGRAM LIMITED
Height: 57 in
Weight: 1178.14 oz

## 2019-04-28 MED ORDER — DILTIAZEM LOAD VIA INFUSION
10.0000 mg | Freq: Once | INTRAVENOUS | Status: AC
  Administered 2019-04-28: 10 mg via INTRAVENOUS
  Filled 2019-04-28: qty 10

## 2019-04-28 MED ORDER — ACETAMINOPHEN 500 MG PO TABS
1000.0000 mg | ORAL_TABLET | Freq: Four times a day (QID) | ORAL | Status: DC
  Administered 2019-04-28 – 2019-04-29 (×3): 1000 mg via ORAL
  Filled 2019-04-28 (×3): qty 2

## 2019-04-28 MED ORDER — DILTIAZEM HCL ER 90 MG PO CP12
90.0000 mg | ORAL_CAPSULE | Freq: Two times a day (BID) | ORAL | Status: DC
  Administered 2019-04-28 – 2019-04-30 (×5): 90 mg via ORAL
  Filled 2019-04-28 (×7): qty 1

## 2019-04-28 MED ORDER — ONDANSETRON HCL 4 MG/2ML IJ SOLN: 4.0000 mg | Freq: Four times a day (QID) | INTRAMUSCULAR | Status: DC | PRN

## 2019-04-28 MED ORDER — TRAMADOL HCL 50 MG PO TABS: 50.0000 mg | ORAL_TABLET | Freq: Four times a day (QID) | ORAL | Status: DC | PRN

## 2019-04-28 MED ORDER — CHLORHEXIDINE GLUCONATE 0.12 % MT SOLN
15.0000 mL | Freq: Two times a day (BID) | OROMUCOSAL | Status: DC
  Administered 2019-04-28 – 2019-05-02 (×8): 15 mL via OROMUCOSAL
  Filled 2019-04-28 (×8): qty 15

## 2019-04-28 MED ORDER — METOPROLOL SUCCINATE ER 25 MG PO TB24
12.5000 mg | ORAL_TABLET | Freq: Every evening | ORAL | Status: DC
  Administered 2019-04-28 – 2019-05-01 (×4): 12.5 mg via ORAL
  Filled 2019-04-28 (×5): qty 1

## 2019-04-28 MED ORDER — HYDRALAZINE HCL 20 MG/ML IJ SOLN
10.0000 mg | INTRAMUSCULAR | Status: DC | PRN
  Administered 2019-04-28: 10 mg via INTRAVENOUS
  Filled 2019-04-28: qty 1

## 2019-04-28 MED ORDER — ONDANSETRON 4 MG PO TBDP
4.0000 mg | ORAL_TABLET | Freq: Four times a day (QID) | ORAL | Status: DC | PRN
  Filled 2019-04-28: qty 1

## 2019-04-28 MED ORDER — INSULIN ASPART 100 UNIT/ML ~~LOC~~ SOLN
0.0000 [IU] | SUBCUTANEOUS | Status: DC
  Administered 2019-04-28 (×3): 3 [IU] via SUBCUTANEOUS
  Administered 2019-04-28: 1 [IU] via SUBCUTANEOUS
  Administered 2019-04-28: 2 [IU] via SUBCUTANEOUS
  Administered 2019-04-29 – 2019-04-30 (×2): 1 [IU] via SUBCUTANEOUS
  Administered 2019-04-30: 2 [IU] via SUBCUTANEOUS
  Administered 2019-05-01: 17:00:00 5 [IU] via SUBCUTANEOUS
  Administered 2019-05-01 (×2): 2 [IU] via SUBCUTANEOUS

## 2019-04-28 MED ORDER — DOCUSATE SODIUM 100 MG PO CAPS
100.0000 mg | ORAL_CAPSULE | Freq: Two times a day (BID) | ORAL | Status: DC
  Administered 2019-04-28 – 2019-05-02 (×8): 100 mg via ORAL
  Filled 2019-04-28 (×8): qty 1

## 2019-04-28 MED ORDER — BISACODYL 10 MG RE SUPP: 10.0000 mg | Freq: Every day | RECTAL | Status: DC | PRN

## 2019-04-28 MED ORDER — FENTANYL CITRATE (PF) 100 MCG/2ML IJ SOLN: 12.5000 ug | INTRAMUSCULAR | Status: DC | PRN

## 2019-04-28 MED ORDER — LEVOFLOXACIN IN D5W 750 MG/150ML IV SOLN
750.0000 mg | INTRAVENOUS | Status: DC
  Administered 2019-04-28: 750 mg via INTRAVENOUS
  Filled 2019-04-28 (×2): qty 150

## 2019-04-28 MED ORDER — GABAPENTIN 300 MG PO CAPS
300.0000 mg | ORAL_CAPSULE | Freq: Two times a day (BID) | ORAL | Status: DC
  Administered 2019-04-28 – 2019-05-02 (×8): 300 mg via ORAL
  Filled 2019-04-28 (×11): qty 1

## 2019-04-28 MED ORDER — DILTIAZEM HCL-DEXTROSE 125-5 MG/125ML-% IV SOLN (PREMIX)
5.0000 mg/h | INTRAVENOUS | Status: DC
  Administered 2019-04-28: 5 mg/h via INTRAVENOUS
  Filled 2019-04-28: qty 125

## 2019-04-28 MED ORDER — CHLORHEXIDINE GLUCONATE CLOTH 2 % EX PADS
6.0000 | MEDICATED_PAD | Freq: Every day | CUTANEOUS | Status: DC
  Administered 2019-04-28 – 2019-05-02 (×4): 6 via TOPICAL

## 2019-04-28 MED ORDER — HEPARIN SODIUM (PORCINE) 5000 UNIT/ML IJ SOLN
5000.0000 [IU] | Freq: Two times a day (BID) | INTRAMUSCULAR | Status: DC
  Administered 2019-04-28 – 2019-05-02 (×9): 5000 [IU] via SUBCUTANEOUS
  Filled 2019-04-28 (×9): qty 1

## 2019-04-28 MED ORDER — IBUPROFEN 600 MG PO TABS: 600.0000 mg | ORAL_TABLET | Freq: Four times a day (QID) | ORAL | Status: DC | PRN

## 2019-04-28 MED ORDER — METOPROLOL TARTRATE 5 MG/5ML IV SOLN
5.0000 mg | Freq: Four times a day (QID) | INTRAVENOUS | Status: DC | PRN
  Administered 2019-04-28: 03:00:00 5 mg via INTRAVENOUS
  Filled 2019-04-28: qty 5

## 2019-04-28 NOTE — Progress Notes (Signed)
Summary- pt rec'd from PACU via bed at [facility]. Pt a;ert to self only. Pt unable to indicate understanding of pain scale, but denied pain. Pt rec'd prn for elevated BP X 2, effective. Pt noted to be in afib, rvr at approx 0505. 12 lead EKG obtained per protocol, hospitalist informed of pt cardiac status and decreased UOP. Information acknowledged, orders rec'd to increase IVF and initiate a cardizem gtt.

## 2019-04-28 NOTE — Evaluation (Signed)
Occupational Therapy Evaluation Patient Details Name: Yolanda Turner MRN: [id number] DOB: 1925/10/18 Today's Date: 04/28/2019    History of Present Illness Pt is a 85 y/o female with PMH of CAD post MI, type 2 DM, PVD, HTN, CVA, presented to [facility] with L Leg pain on 04/26/19. Further work up revealed SBO due to incarcerated obturator hernia, and pneumonia. S/p reduction of obturator hernia 11/14.    Clinical Impression   Patient admitted for above and limited by problem list below, including generalized pain, decreased activity tolerance, generalized weakness and impaired balance.  PTA, per granddaughters report, patient was using RW for mobility with supervision given intermittent assistance as needed for transfers, completing ADLs independently and limited IADLs.  Known history of expressive aphasia from CVA, but able to answer yes/no questions well.  She currently requires max assist for transfers, max-total assist for self care tasks at this time.  Granddaughter reports she is able to provide 24/7, physical assist as needed.  Based on performance today, patient will benefit from continued OT services while admitted and after dc at [facility] level in order to optimize independence, decrease burden of care, and return to PLOF with ADLs/mobility.      Follow Up Recommendations  Home health OT;Supervision/Assistance - 24 hour    Equipment Recommendations  3 in 1 bedside commode    Recommendations for Other Services       Precautions / Restrictions Precautions Precautions: Fall Restrictions Weight Bearing Restrictions: No      Mobility Bed Mobility Overal bed mobility: Needs Assistance Bed Mobility: Rolling;Sidelying to Sit Rolling: Max assist Sidelying to sit: Max assist;+2 for physical assistance;+2 for safety/equipment       General bed mobility comments: patient requires increased time and effort, mulitmodal cueing to initate and sequence steps with physical support for LB, trunk  and scooting to EOB  Transfers Overall transfer level: Needs assistance   Transfers: Sit to/from Stand;Stand Pivot Transfers Sit to Stand: Max assist;+2 safety/equipment Stand pivot transfers: Max assist;+2 safety/equipment       General transfer comment: max assist to power up and bear weight through LEs, pivoting to recliner with max assist     Balance Overall balance assessment: Needs assistance Sitting-balance support: Feet supported;No upper extremity supported Sitting balance-Leahy Scale: Poor Sitting balance - Comments: R lateral lean with unsupported sitting  Postural control: Right lateral lean Standing balance support: No upper extremity supported;During functional activity Standing balance-Leahy Scale: Poor Standing balance comment: relaint on external support                           ADL either performed or assessed with clinical judgement   ADL Overall ADL's : Needs assistance/impaired                                       General ADL Comments: requires max-total assist for all self care at this time, limited by weakness, decreased activity tolerance, pain and impaired balance      Vision   Additional Comments: further assessment required     Perception     Praxis      Pertinent Vitals/Pain Pain Assessment: Faces Faces Pain Scale: Hurts little more Pain Location: generalized  Pain Descriptors / Indicators: Discomfort Pain Intervention(s): Monitored during session;Repositioned     Hand Dominance     Extremity/Trunk Assessment Upper Extremity Assessment Upper Extremity Assessment:  Generalized weakness   Lower Extremity Assessment Lower Extremity Assessment: Defer to PT evaluation       Communication Communication Communication: Expressive difficulties   Cognition Arousal/Alertness: Lethargic Behavior During Therapy: Flat affect Overall Cognitive Status: Difficult to assess Area of Impairment: Following commands                        Following Commands: Follows one step commands with increased time;Follows one step commands inconsistently       General Comments: patient with limited verbalizations during session, hx of expressive aphasia from CVA   General Comments  spoke to granddaughter, [name], who provided PLOF and home setup    Exercises     Shoulder Instructions      Home Living Family/patient expects to be discharged to:: Private residence Living Arrangements: Other (Comment)(granddaughter, [name]) Available Help at Discharge: Family;Available 24 hours/day Type of Home: House Home Access: Ramped entrance     Home Layout: One level     Bathroom Shower/Tub: Occupational psychologist: Handicapped height Bathroom Accessibility: Yes   Home Equipment: Wheelchair - Insurance claims handler - 2 wheels;Bedside commode   Additional Comments: does not wear O2 at home      Prior Functioning/Environment Level of Independence: Needs assistance  Gait / Transfers Assistance Needed: supervision for mobility using RW, at times assists with transfers  ADL's / Homemaking Assistance Needed: independent with ADL, still performing housework ad lib Communication / Swallowing Assistance Needed: expressive aphasia due to prior CVA Comments: history obtained from granddaughter, [name]        OT Problem List: Decreased strength;Decreased activity tolerance;Impaired balance (sitting and/or standing);Impaired vision/perception;Decreased coordination;Decreased cognition;Decreased safety awareness;Decreased knowledge of use of DME or AE;Decreased knowledge of precautions;Pain      OT Treatment/Interventions: Self-care/ADL training;Therapeutic exercise;DME and/or AE instruction;Therapeutic activities;Patient/family education;Balance training;Cognitive remediation/compensation    OT Goals(Current goals can be found in the care plan section) Acute Rehab OT Goals Patient Stated Goal:  to get back home  OT Goal Formulation: With family Time For Goal Achievement: 05/12/19 Potential to Achieve Goals: Fair  OT Frequency: Min 3X/week   Barriers to D/C:            Co-evaluation PT/OT/SLP Co-Evaluation/Treatment: Yes Reason for Co-Treatment: For patient/therapist safety;To address functional/ADL transfers   OT goals addressed during session: ADL's and self-care      AM-PAC OT "6 Clicks" Daily Activity     Outcome Measure Help from another person eating meals?: Total Help from another person taking care of personal grooming?: A Lot Help from another person toileting, which includes using toliet, bedpan, or urinal?: Total Help from another person bathing (including washing, rinsing, drying)?: Total Help from another person to put on and taking off regular upper body clothing?: Total Help from another person to put on and taking off regular lower body clothing?: Total 6 Click Score: 7   End of Session Equipment Utilized During Treatment: Oxygen(2L) Nurse Communication: Mobility status;Precautions  Activity Tolerance: Patient limited by pain;Patient limited by lethargy Patient left: in chair;with call bell/phone within reach  OT Visit Diagnosis: Other abnormalities of gait and mobility (R26.89);Muscle weakness (generalized) (M62.81);Pain;Cognitive communication deficit (R41.841) Symptoms and signs involving cognitive functions: Cerebral infarction(old) Pain - part of body: (genearlized)                Time: [id number] OT Time Calculation (min): 20 min Charges:  OT General Charges $OT Visit: 1 Visit OT Evaluation $OT Eval Moderate Complexity: 1 Mod  Delight Stare, OT Acute Rehabilitation Services Pager [phone] Office [phone]   Delight Stare 04/28/2019, 10:10 AM

## 2019-04-28 NOTE — Progress Notes (Signed)
[facility]/Trauma Progress Note  1 Day Post-Op   Assessment/Plan Active Problems:   Obturator hernia   Small bowel obstruction (HCC)  CVA H/o CABA PVD MI GERD DM CAD  Obturator hernia - S/P Exploratory laparotomy, reduction of incarcerated small bowel from obturator hernia, primary closure of peritoneum to obliterate the obturator hernia, Dr. Kae Heller, 11/14 - DC NGT, CLD - continue foley in setting of low urine output  FEN: CLD VTE: SCD's, heparin ID: Levaquin 11/14>> Foley: conitnue Follow up: Dr. Kae Heller  DISPO: IS, DC NGT, CLD, PT/OT    LOS: 1 day    Subjective/CC:  She is cold. She is not complaining of pain. Nurse states no issues overnight.   Objective: Vital signs in last 24 hours: Temp:  [97.5 F (36.4 C)-98.9 F (37.2 C)] 97.8 F (36.6 C) (11/15 0344) Pulse Rate:  [71-129] 129 (11/15 0600) Resp:  [16-37] 22 (11/15 0600) BP: (91-220)/(40-83) 91/62 (11/15 0600) SpO2:  [94 %-100 %] 95 % (11/15 0600) Arterial Line BP: (129-218)/(41-75) 132/49 (11/15 0600) Weight:  [33.4 kg] 33.4 kg (11/15 0130) Last BM Date: 04/27/19  Intake/Output from previous day: 11/14 0701 - 11/15 0700 In: 632.3 [I.V.:550.9; NG/GT:45; IV Piggyback:36.4] Out: 360 [Urine:290; Emesis/NG output:50; Blood:20] Intake/Output this shift: No intake/output data recorded.  PE:  Gen:  Alert, NAD, pleasant HEENT: NGT in place with scant output, non bilious  Pulm:  Rate and effort normal Abd: Soft, NT/ND, +BS, incisions C/D/I Skin: no rashes noted, warm and dry   Anti-infectives: Anti-infectives (From admission, onward)   Start     Dose/Rate Route Frequency Ordered Stop   04/28/19 0500  levofloxacin (LEVAQUIN) IVPB 750 mg     750 mg 100 mL/hr over 90 Minutes Intravenous Every 48 hours 04/28/19 0424     04/27/19 2115  vancomycin (VANCOCIN) 500 mg in sodium chloride 0.9 % 100 mL IVPB     500 mg 100 mL/hr over 60 Minutes Intravenous To Surgery 04/27/19 2104 04/27/19  2342      Lab Results:  Recent Labs    04/27/19 0412 04/28/19 0426  WBC 9.3 20.2*  HGB 11.2* 10.3*  HCT 33.5* 30.8*  PLT 203 230   BMET Recent Labs    04/27/19 0412 04/28/19 0426  NA 139 138  K 4.1 3.5  CL 102 100  CO2 27 26  GLUCOSE 183* 192*  BUN 13 17  CREATININE 0.67 0.73  CALCIUM 9.3 9.5   PT/INR No results for input(s): LABPROT, INR in the last 72 hours. CMP     Component Value Date/Time   NA 138 04/28/2019 0426   NA 139 05/23/2013 1851   K 3.5 04/28/2019 0426   K 4.7 05/23/2013 1851   CL 100 04/28/2019 0426   CL 106 05/23/2013 1851   CO2 26 04/28/2019 0426   CO2 30 05/23/2013 1851   GLUCOSE 192 (H) 04/28/2019 0426   GLUCOSE 246 (H) 05/23/2013 1851   BUN 17 04/28/2019 0426   BUN 22 (H) 05/23/2013 1851   CREATININE 0.73 04/28/2019 0426   CREATININE 0.88 05/23/2013 1851   CALCIUM 9.5 04/28/2019 0426   CALCIUM 9.3 05/23/2013 1851   PROT 6.5 04/26/2019 1905   PROT 6.5 05/23/2013 1851   ALBUMIN 3.8 04/26/2019 1905   ALBUMIN 3.4 05/23/2013 1851   AST 21 04/26/2019 1905   AST 29 05/23/2013 1851   ALT 13 04/26/2019 1905   ALT 21 05/23/2013 1851   ALKPHOS 61 04/26/2019 1905   ALKPHOS 61 05/23/2013 1851  BILITOT 0.6 04/26/2019 1905   BILITOT 0.2 05/23/2013 1851   GFRNONAA >60 04/28/2019 0426   GFRNONAA 60 (L) 05/23/2013 1851   GFRAA >60 04/28/2019 0426   GFRAA >60 05/23/2013 1851   Lipase     Component Value Date/Time   LIPASE 31 05/02/2017 0305    Studies/Results: Dg Chest 1 View  Result Date: 04/26/2019 CLINICAL DATA:  Left hip pain and leg pain. EXAM: CHEST  1 VIEW COMPARISON:  Chest x-ray 12/04/2018 FINDINGS: Heart size remains enlarged following median sternotomy with signs of aortic atherosclerosis. Increased interstitial markings bilaterally with more pronounced opacity in the left chest, left upper lobe. No signs of pleural effusion. Lungs are less hyperinflated than before. No acute bone finding. IMPRESSION: Findings that are  suspicious for atypical pneumonia versus asymmetric pulmonary edema. Cardiomegaly and changes of sternotomy as before. COPD Electronically Signed   By: Zetta Bills M.D.   On: 04/26/2019 19:34   Ct Chest W Contrast  Result Date: 04/26/2019 CLINICAL DATA:  Interstitial lung disease. New infiltrates on chest x-ray. Abdominal distension with concern for bowel obstruction. EXAM: CT CHEST, ABDOMEN, AND PELVIS WITH CONTRAST TECHNIQUE: Multidetector CT imaging of the chest, abdomen and pelvis was performed following the standard protocol during bolus administration of intravenous contrast. CONTRAST:  35mL OMNIPAQUE IOHEXOL 300 MG/ML  SOLN COMPARISON:  CT chest dated December 04, 2018. CT of the pelvis dated 05/24/2013 FINDINGS: CT CHEST FINDINGS Cardiovascular: There is no evidence for large centrally located pulmonary embolism. Detection of smaller segmental and subsegmental pulmonary emboli is limited by technique and contrast bolus timing. Advanced aortic calcifications are noted. The main pulmonary artery is dilated. The main right and left pulmonary arteries are dilated. The heart is not significantly enlarged. There is no pericardial effusion. Mediastinum/Nodes: --No mediastinal or hilar lymphadenopathy. --No axillary lymphadenopathy. --No supraclavicular lymphadenopathy. --Normal thyroid gland. --The esophagus is unremarkable Lungs/Pleura: There are multifocal areas of consolidation, greatest within the left upper and bilateral lower lobes. There is scarring versus atelectasis at the lung bases. There is no pneumothorax. There are trace bilateral pleural effusions. Musculoskeletal: No chest wall abnormality. No acute or significant osseous findings. CT ABDOMEN PELVIS FINDINGS Hepatobiliary: The liver is normal. Normal gallbladder.There is no biliary ductal dilation. Pancreas: Normal contours without ductal dilatation. No peripancreatic fluid collection. Spleen: No splenic laceration or hematoma. Adrenals/Urinary  Tract: --Adrenal glands: No adrenal hemorrhage. --Right kidney/ureter: No hydronephrosis or perinephric hematoma. --Left kidney/ureter: No hydronephrosis or perinephric hematoma. --Urinary bladder: The urinary bladder is moderately distended. Stomach/Bowel: --Stomach/Duodenum: The stomach is distended with an air-fluid level. --Small bowel: There are dilated loops of small bowel in the low midline abdomen and pelvis measuring up to approximately 3.8 cm in diameter. This appears to be secondary to an obturator hernia through the left obturator foramen. --Colon: Rectosigmoid diverticulosis without acute inflammation. --Appendix: Not visualized. No right lower quadrant inflammation or free fluid. Vascular/Lymphatic: Atherosclerotic calcification is present within the non-aneurysmal abdominal aorta, without hemodynamically significant stenosis. --No retroperitoneal lymphadenopathy. --No mesenteric lymphadenopathy. --No pelvic or inguinal lymphadenopathy. Reproductive: Status post hysterectomy. No adnexal mass. Other: There is a small amount of free fluid in the patient's abdomen and pelvis. There is no significant free air. Musculoskeletal. No acute displaced fractures. IMPRESSION: 1. Small-bowel obstruction likely secondary to an obturator hernia through the left obturator foramen. 2. Multifocal pneumonia (viral or bacterial), greatest within the left upper lobe. 3. Trace bilateral pleural effusions. 4. Moderately distended urinary bladder. 5. Sigmoid diverticulosis without CT evidence for diverticulitis. 6. Aortic  atherosclerosis. Aortic Atherosclerosis (ICD10-I70.0). Electronically Signed   By: Constance Holster M.D.   On: 04/26/2019 23:34   Ct Abdomen Pelvis W Contrast  Result Date: 04/26/2019 CLINICAL DATA:  Interstitial lung disease. New infiltrates on chest x-ray. Abdominal distension with concern for bowel obstruction. EXAM: CT CHEST, ABDOMEN, AND PELVIS WITH CONTRAST TECHNIQUE: Multidetector CT imaging of  the chest, abdomen and pelvis was performed following the standard protocol during bolus administration of intravenous contrast. CONTRAST:  48mL OMNIPAQUE IOHEXOL 300 MG/ML  SOLN COMPARISON:  CT chest dated December 04, 2018. CT of the pelvis dated 05/24/2013 FINDINGS: CT CHEST FINDINGS Cardiovascular: There is no evidence for large centrally located pulmonary embolism. Detection of smaller segmental and subsegmental pulmonary emboli is limited by technique and contrast bolus timing. Advanced aortic calcifications are noted. The main pulmonary artery is dilated. The main right and left pulmonary arteries are dilated. The heart is not significantly enlarged. There is no pericardial effusion. Mediastinum/Nodes: --No mediastinal or hilar lymphadenopathy. --No axillary lymphadenopathy. --No supraclavicular lymphadenopathy. --Normal thyroid gland. --The esophagus is unremarkable Lungs/Pleura: There are multifocal areas of consolidation, greatest within the left upper and bilateral lower lobes. There is scarring versus atelectasis at the lung bases. There is no pneumothorax. There are trace bilateral pleural effusions. Musculoskeletal: No chest wall abnormality. No acute or significant osseous findings. CT ABDOMEN PELVIS FINDINGS Hepatobiliary: The liver is normal. Normal gallbladder.There is no biliary ductal dilation. Pancreas: Normal contours without ductal dilatation. No peripancreatic fluid collection. Spleen: No splenic laceration or hematoma. Adrenals/Urinary Tract: --Adrenal glands: No adrenal hemorrhage. --Right kidney/ureter: No hydronephrosis or perinephric hematoma. --Left kidney/ureter: No hydronephrosis or perinephric hematoma. --Urinary bladder: The urinary bladder is moderately distended. Stomach/Bowel: --Stomach/Duodenum: The stomach is distended with an air-fluid level. --Small bowel: There are dilated loops of small bowel in the low midline abdomen and pelvis measuring up to approximately 3.8 cm in  diameter. This appears to be secondary to an obturator hernia through the left obturator foramen. --Colon: Rectosigmoid diverticulosis without acute inflammation. --Appendix: Not visualized. No right lower quadrant inflammation or free fluid. Vascular/Lymphatic: Atherosclerotic calcification is present within the non-aneurysmal abdominal aorta, without hemodynamically significant stenosis. --No retroperitoneal lymphadenopathy. --No mesenteric lymphadenopathy. --No pelvic or inguinal lymphadenopathy. Reproductive: Status post hysterectomy. No adnexal mass. Other: There is a small amount of free fluid in the patient's abdomen and pelvis. There is no significant free air. Musculoskeletal. No acute displaced fractures. IMPRESSION: 1. Small-bowel obstruction likely secondary to an obturator hernia through the left obturator foramen. 2. Multifocal pneumonia (viral or bacterial), greatest within the left upper lobe. 3. Trace bilateral pleural effusions. 4. Moderately distended urinary bladder. 5. Sigmoid diverticulosis without CT evidence for diverticulitis. 6. Aortic atherosclerosis. Aortic Atherosclerosis (ICD10-I70.0). Electronically Signed   By: Constance Holster M.D.   On: 04/26/2019 23:34   US Venous Img Lower Unilateral Left (dvt)  Result Date: 04/27/2019 CLINICAL DATA:  Left lower extremity pain EXAM: LEFT LOWER EXTREMITY VENOUS DOPPLER ULTRASOUND TECHNIQUE: Gray-scale sonography with graded compression, as well as color Doppler and duplex ultrasound were performed to evaluate the lower extremity deep venous systems from the level of the common femoral vein and including the common femoral, femoral, profunda femoral, popliteal and calf veins including the posterior tibial, peroneal and gastrocnemius veins when visible. The superficial great saphenous vein was also interrogated. Spectral Doppler was utilized to evaluate flow at rest and with distal augmentation maneuvers in the common femoral, femoral and  popliteal veins. COMPARISON:  None. FINDINGS: Contralateral Common Femoral Vein: Not assessed.  Common Femoral Vein: No evidence of thrombus. Normal compressibility, respiratory phasicity and response to augmentation. Saphenofemoral Junction: No evidence of thrombus. Normal compressibility and flow on color Doppler imaging. Profunda Femoral Vein: No evidence of thrombus. Normal compressibility and flow on color Doppler imaging. Femoral Vein: No evidence of thrombus. Normal compressibility, respiratory phasicity and response to augmentation. Popliteal Vein: No evidence of thrombus. Normal compressibility, respiratory phasicity and response to augmentation. Calf Veins: No evidence of thrombus. Normal compressibility and flow on color Doppler imaging. Superficial Great Saphenous Vein: No evidence of thrombus. Normal compressibility. Venous Reflux:  None. Other Findings:  None. IMPRESSION: No evidence of deep venous thrombosis. Electronically Signed   By: Rolm Baptise M.D.   On: 04/27/2019 02:52   US Arterial Lower Extremity Duplex Left (non-abi)  Result Date: 04/27/2019 CLINICAL DATA:  Left lower extremity pain, peripheral vascular disease, hypertension, diabetes and coronary artery disease. EXAM: LEFT LOWER EXTREMITY ARTERIAL DUPLEX SCAN TECHNIQUE: Gray-scale sonography as well as color Doppler and duplex ultrasound was performed to evaluate the lower extremity arteries including the common, superficial and profunda femoral arteries, popliteal artery and calf arteries. COMPARISON:  None. FINDINGS: Left lower Extremity Inflow: The common femoral artery demonstrates moderate eccentric calcified plaque without visible significant luminal stenosis. Arterial waveform is triphasic. Outflow: Normal velocities. SFA waveform is biphasic. Profunda femoral waveform is triphasic. Calcified plaque is seen throughout the SFA. Popliteal artery is patent with biphasic waveform. Mild calcified plaque is present in the popliteal  artery. Runoff: Limited tibial artery evaluation with anterior and posterior tibial arteries demonstrating normal velocities, biphasic waveforms and scattered plaque. posterior and anterior tibial arterial waveforms and velocities. Vessels are patent to the ankle. IMPRESSION: Diffuse atherosclerosis of left lower extremity arteries without focal occlusion or abnormal velocities detected by duplex ultrasound. Electronically Signed   By: Aletta Edouard M.D.   On: 04/27/2019 09:03   Dg Abd Portable 2 Views  Result Date: 04/26/2019 CLINICAL DATA:  Dilated bowel for further workup EXAM: PORTABLE ABDOMEN - 2 VIEW COMPARISON:  Hip radiographs 04/26/2019 FINDINGS: Again we demonstrate a dilated loop of small bowel in the right lower quadrant, measuring about 4.1 cm in diameter. There is formed stool in the colon. The upright abdomen radiograph excludes the pelvis, the dilated loop is seen but month certain if there are associated air-fluid levels because the pelvis is excluded. Aortic and splenic artery atherosclerosis. Small vessel stent in the abdomen, possibly of the left renal artery. Left hip hemiarthroplasty. Bony demineralization. The upright view includes some of the chest and demonstrates indistinct left perihilar airspace opacity as well as bilateral interstitial opacity in the lungs. Prior CABG noted. IMPRESSION: 1. Abnormally dilated loop of small bowel in the right lower quadrant, similar to prior appearance the. The appearance is abnormal but nonspecific and could represent local ileus or early obstruction. However, there is formed stool in the colon. 2. Airspace opacity in the left perihilar region with bilateral interstitial accentuation, possibilities include asymmetric edema or pneumonia. 3. Mild enlargement of the cardiopericardial silhouette 4. Atherosclerosis. 5. Bony demineralization. Electronically Signed   By: Van Clines M.D.   On: 04/26/2019 20:13   Dg Hip Unilat W Or Wo Pelvis 2-3  Views Left  Result Date: 04/26/2019 CLINICAL DATA:  Left hip and leg pain EXAM: DG HIP (WITH OR WITHOUT PELVIS) 2-3V LEFT COMPARISON:  Femoral exam of the same date, postoperative exam of 11/29/2018 FINDINGS: Signs of left hip arthroplasty. No signs of periprosthetic fracture. Osteopenia with minimal degenerative changes in  the right hip. No signs of pelvic fracture with evidence of calcified atherosclerotic changes overlying the pelvis. Focal bowel dilation in the right lower quadrant, small bowel loops up to 4 cm are incidentally noted. There is evidence of some stool and gas in the rectum. IMPRESSION: 1. Left hip arthroplasty without signs of acute fracture or dislocation. 2. Focal bowel dilation in the right lower quadrant, small bowel loops up to 4 cm are incidentally noted, consider dedicated abdominal plain film to exclude ileus versus early small bowel obstruction. 3. Signs of calcified atherosclerotic changes in the pelvis. Electronically Signed   By: Zetta Bills M.D.   On: 04/26/2019 19:37   Dg Femur Min 2 Views Left  Result Date: 04/26/2019 CLINICAL DATA:  Leg pain, left leg. EXAM: LEFT FEMUR 2 VIEWS COMPARISON:  11/29/2018 FINDINGS: Post left hip arthroplasty without signs of periprosthetic fracture. Signs of vascular calcification along superficial femoral artery course. Osteopenia. IMPRESSION: Post left hip arthroplasty. No acute abnormality. Electronically Signed   By: Zetta Bills M.D.   On: 04/26/2019 19:30     Kalman Drape, [facility] Surgery Please see amion for pager for the following: Cristine Polio, & Friday 7:00am - 4:30pm Thursdays 7:00am -11:30am

## 2019-04-28 NOTE — Progress Notes (Signed)
  Echocardiogram 2D Echocardiogram has been performed.  Yolanda Turner 04/28/2019, 1:52 PM

## 2019-04-28 NOTE — Progress Notes (Signed)
PHARMACY NOTE:  ANTIMICROBIAL RENAL DOSAGE ADJUSTMENT  Current antimicrobial regimen includes a mismatch between antimicrobial dosage and estimated renal function.  As per policy approved by the Pharmacy & Therapeutics and Medical Executive Committees, the antimicrobial dosage will be adjusted accordingly.  Current antimicrobial dosage:  Levaquin 750mg  IV Q24H  Indication: CAP  Renal Function: Estimated Creatinine Clearance: 24.2 mL/min (by C-G formula based on SCr of 0.67 mg/dL).     Antimicrobial dosage has been changed to:  Levaquin 750mg  IV Q48H.    Thank you for allowing pharmacy to be a part of this patient's care.  Wynona Neat, PharmD, BCPS  04/28/2019 4:29 AM

## 2019-04-28 NOTE — Anesthesia Postprocedure Evaluation (Signed)
Anesthesia Post Note  Patient: Yolanda Turner  Procedure(s) Performed: EXPLORATORY LAPAROTOMY, REDUCTION AND REPAIR OBTURATOR HERNIA (N/A )     Patient location during evaluation: PACU Anesthesia Type: General Level of consciousness: awake and confused (At baseline) Pain management: pain level controlled Vital Signs Assessment: post-procedure vital signs reviewed and stable Respiratory status: spontaneous breathing, nonlabored ventilation, respiratory function stable and patient connected to nasal cannula oxygen Cardiovascular status: blood pressure returned to baseline and stable Postop Assessment: no apparent nausea or vomiting Anesthetic complications: no    Last Vitals:  Vitals:   04/28/19 0045 04/28/19 0127  BP:    Pulse: 99   Resp: 20   Temp: 36.8 C (!) 36.4 C  SpO2: 98%     Last Pain:  Vitals:   04/28/19 0127  TempSrc: Oral                 Audry Pili

## 2019-04-28 NOTE — Progress Notes (Signed)
Attempt to call report - nurse unable to take report at this time will call back in approx 15 min

## 2019-04-28 NOTE — Evaluation (Signed)
Physical Therapy Evaluation Patient Details Name: Yolanda Turner MRN: [id number] DOB: June 20, 1925 Today's Date: 04/28/2019   History of Present Illness  Pt is a 85 y/o female with PMH of CAD post MI, type 2 DM, PVD, HTN, CVA, presented to [facility] with L Leg pain on 04/26/19. Further work up revealed SBO due to incarcerated obturator hernia, and pneumonia. S/p reduction of obturator hernia 11/14.   Clinical Impression  Patient presents with decreased mobility due to weakness, pain, limited activity tolerance and decreased balance.  Currently max A +1-2 for OOB to chair and previously was ambulatory with RW and some intermittent assist of family.  She still liked to participate in light chores.  Family wanting home with in home care.  Recommend skilled PT during acute stay to address deficits and follow up HHPT/HHaide at d/c.     Follow Up Recommendations Home health PT;Supervision/Assistance - 24 hour(HHaide)    Equipment Recommendations  None recommended by PT    Recommendations for Other Services       Precautions / Restrictions Precautions Precautions: Fall Restrictions Weight Bearing Restrictions: No      Mobility  Bed Mobility Overal bed mobility: Needs Assistance Bed Mobility: Rolling;Sidelying to Sit Rolling: Max assist Sidelying to sit: Max assist;+2 for physical assistance;+2 for safety/equipment       General bed mobility comments: patient requires increased time and effort, mulitmodal cueing to initate and sequence steps with physical support for LB, trunk and scooting to EOB  Transfers Overall transfer level: Needs assistance   Transfers: Sit to/from Stand;Stand Pivot Transfers Sit to Stand: Max assist;+2 safety/equipment Stand pivot transfers: Max assist;+2 safety/equipment       General transfer comment: lifting assist from EOB, pt able to weight bear for pivot to recliner with max A  Ambulation/Gait                Stairs            Wheelchair  Mobility    Modified Rankin (Stroke Patients Only)       Balance Overall balance assessment: Needs assistance Sitting-balance support: Feet supported;No upper extremity supported Sitting balance-Leahy Scale: Poor Sitting balance - Comments: R lateral lean with unsupported sitting  Postural control: Right lateral lean Standing balance support: No upper extremity supported;During functional activity Standing balance-Leahy Scale: Poor Standing balance comment: relaint on external support                             Pertinent Vitals/Pain Pain Assessment: Faces Faces Pain Scale: Hurts little more Pain Location: generalized  Pain Descriptors / Indicators: Discomfort;Grimacing;Moaning Pain Intervention(s): Monitored during session;Repositioned;Limited activity within patient's tolerance    Home Living Family/patient expects to be discharged to:: Private residence Living Arrangements: Other (Comment)(grandaughter, Cabin crew) Available Help at Discharge: Family;Available 24 hours/day Type of Home: House Home Access: Ramped entrance     Home Layout: One level Home Equipment: Wheelchair - Insurance claims handler - 2 wheels;Bedside commode Additional Comments: does not wear O2 at home    Prior Function Level of Independence: Needs assistance   Gait / Transfers Assistance Needed: supervision for mobility using RW, at times assists with transfers   ADL's / Homemaking Assistance Needed: independent with ADL, still performing housework ad lib  Comments: history obtained from granddaughter, Vashti Hey Dominance        Extremity/Trunk Assessment   Upper Extremity Assessment Upper Extremity Assessment: Defer to OT evaluation  Lower Extremity Assessment Lower Extremity Assessment: LLE deficits/detail;RLE deficits/detail RLE Deficits / Details: AAROM ankle DF limited with feet held in plantarflexion at rest, R heel with wounds and dressings; knees stiff, but  able to flex sitting EOB at least to 90, generalized weakness LLE Deficits / Details: AAROM ankle DF limited with feet held in plantarflexion at rest, R heel with wounds and dressings; knees stiff, but able to flex sitting EOB at least to 90, generalized weakness    Cervical / Trunk Assessment Cervical / Trunk Assessment: Kyphotic  Communication   Communication: Expressive difficulties;HOH  Cognition Arousal/Alertness: Lethargic Behavior During Therapy: Flat affect Overall Cognitive Status: Difficult to assess Area of Impairment: Following commands                       Following Commands: Follows one step commands with increased time;Follows one step commands inconsistently       General Comments: patient with limited verbalizations during session, hx of expressive aphasia from CVA      General Comments General comments (skin integrity, edema, etc.): VSS seated EOB, pt with discomfort, RN made aware    Exercises     Assessment/Plan    PT Assessment Patient needs continued PT services  PT Problem List Decreased strength;Decreased activity tolerance;Decreased mobility;Decreased range of motion;Decreased balance;Decreased knowledge of use of DME;Pain;Decreased safety awareness       PT Treatment Interventions DME instruction;Therapeutic activities;Balance training;Patient/family education;Therapeutic exercise;Functional mobility training;Gait training    PT Goals (Current goals can be found in the Care Plan section)  Acute Rehab PT Goals Patient Stated Goal: to get back home  PT Goal Formulation: With patient/family Time For Goal Achievement: 05/12/19 Potential to Achieve Goals: Fair    Frequency Min 3X/week   Barriers to discharge        Co-evaluation PT/OT/SLP Co-Evaluation/Treatment: Yes Reason for Co-Treatment: For patient/therapist safety;To address functional/ADL transfers PT goals addressed during session: Mobility/safety with  mobility;Strengthening/ROM OT goals addressed during session: ADL's and self-care       AM-PAC PT "6 Clicks" Mobility  Outcome Measure Help needed turning from your back to your side while in a flat bed without using bedrails?: Total Help needed moving from lying on your back to sitting on the side of a flat bed without using bedrails?: Total Help needed moving to and from a bed to a chair (including a wheelchair)?: Total Help needed standing up from a chair using your arms (e.g., wheelchair or bedside chair)?: Total Help needed to walk in hospital room?: Total Help needed climbing 3-5 steps with a railing? : Total 6 Click Score: 6    End of Session Equipment Utilized During Treatment: Gait belt;Oxygen Activity Tolerance: Patient limited by fatigue;Patient limited by pain Patient left: in chair;with call bell/phone within reach Nurse Communication: Mobility status PT Visit Diagnosis: Muscle weakness (generalized) (M62.81);Difficulty in walking, not elsewhere classified (R26.2)    Time: [id number] PT Time Calculation (min) (ACUTE ONLY): 20 min   Charges:   PT Evaluation $PT Eval Moderate Complexity: [place], [place] Acute Rehabilitation Services [phone] 04/28/2019   Reginia Naas 04/28/2019, 11:16 AM

## 2019-04-28 NOTE — Progress Notes (Addendum)
Yolanda Turner Kitchen  PROGRESS NOTE    Yolanda Turner  [id number] DOB: Oct 09, 1925 DOA: 04/27/2019 PCP: Albina Billet, MD   Brief Narrative:   Yolanda Turner is a 85 y.o. female with medical history significant of CAD status post MI, type 2 diabetes peripheral vascular disease GERD hypertension CVA, who presented to who presented to [facility] with complaints of left leg pain on 04/26/2019.  Patient patient on further work-up was found to have a small bowel obstruction due to an incarcerated obturator hernia.  Patient was also found to have pneumonia with acute episode of hypoxemia documented in ED.  Patient was admitted to hospitalist service and treated for pneumonia and supportively treated for the small bowel obstruction and surgery was consulted.  Due to multiple comorbidities and current acute medical problems and need for surgical intervention it was recommended that patient be transferred to [facility] for further care due to possible need for critical care bed status post surgery.  Patient is now status post uneventful surgery where incarcerated hernia was reduced.  Patient describes had no surgical complications.  Her respiratory status also throughout surgery had remained stable.  Patient is placed in PCU for further postop care and monitoring  11/15: somewhat groggy this am. Says stomach is sore.   Assessment & Plan:   Active Problems:   Obturator hernia   Small bowel obstruction (HCC)  Obturator hernia incarcerated with bowel obstruction     - Status post surgical reduction     - Postsurgical care per surgery     - pain meds per surgery     - started on CLD  Community-acquired pneumonia with hypoxemia     - CXR noted     - currently on levaquin, continue     - Bld Cx neg to date     - Supportive care with supplemental O2 titrate off as able  Hypertensive urgency     - resume metoprolol  History of chronic systolic heart failure EF of around 25%     - No acute exacerbation at this time    - watch I&O, getting gently IVF     - BNP 17; somewhat dry  Elevated troponin     - likely due to demand ischemia     - echo pending     - trp stable  A fib RVR     - ON went into a fib     - started on dilt gtt; titrate and convert to PO; consider cards consultl  Left heel ulcer     - Wound care to see  Mild dementia     - Per prior hospitalist note Yolanda Turner daughter/granddaughter Yolanda Turner have decision-making power on patient's behalf.  Normocytic anemia     - no evidence of bleed  Hyperglycemia     - check A1c  DVT prophylaxis: heparin Code Status: FULL   Disposition Plan: TBD  Consultants:   General Surgery  Antimicrobials:   levaquin   ROS:  Reports ab pain. Denies CP, dyspnea, N, V . Remainder 10-pt ROS is negative for all not previously mentioned.  Subjective: "It's sore."  Objective: Vitals:   04/28/19 0800 04/28/19 0900 04/28/19 1000 04/28/19 1134  BP: (!) 101/43 (!) 117/48 (!) 108/39   Pulse: 76 80 79   Resp: 17 18 16    Temp:    (!) 97.4 F (36.3 C)  TempSrc:    Axillary  SpO2: 99% 100% 100%   Weight:  Intake/Output Summary (Last 24 hours) at 04/28/2019 1452 Last data filed at 04/28/2019 1000 Gross per 24 hour  Intake 987.77 ml  Output 460 ml  Net 527.77 ml   Filed Weights   04/28/19 0130  Weight: 33.4 kg    Examination:  General: 85 y.o. female resting in chair in NAD Cardiovascular: RRR, +S1, S2, no m/g/r, equal pulses throughout Respiratory: clear anteriorly, shallow, no w/r/r, normal WOB GI: BS+, non-distended, LUQ/LLQ TTP, soft MSK: No e/c/c Skin: No rashes, bruises, ulcerations noted Neuro: alert to name, follows commands Psyc: calm/cooperative   Data Reviewed: I have personally reviewed following labs and imaging studies.  CBC: Recent Labs  Lab 04/26/19 1905 04/27/19 0412 04/28/19 0426  WBC 8.9 9.3 20.2*  NEUTROABS 7.7  --   --   HGB 11.5* 11.2* 10.3*  HCT 36.6 33.5* 30.8*  MCV 89.9 85.7 87.3  PLT 219  203 [id number]   Basic Metabolic Panel: Recent Labs  Lab 04/26/19 1905 04/27/19 0412 04/28/19 0426  NA 141 139 138  K 3.8 4.1 3.5  CL 104 102 100  CO2 25 27 26   GLUCOSE 188* 183* 192*  BUN 14 13 17   CREATININE 0.78 0.67 0.73  CALCIUM 9.5 9.3 9.5   GFR: Estimated Creatinine Clearance: 24.2 mL/min (by C-G formula based on SCr of 0.73 mg/dL). Liver Function Tests: Recent Labs  Lab 04/26/19 1905  AST 21  ALT 13  ALKPHOS 61  BILITOT 0.6  PROT 6.5  ALBUMIN 3.8   No results for input(s): LIPASE, AMYLASE in the last 168 hours. No results for input(s): AMMONIA in the last 168 hours. Coagulation Profile: No results for input(s): INR, PROTIME in the last 168 hours. Cardiac Enzymes: No results for input(s): CKTOTAL, CKMB, CKMBINDEX, TROPONINI in the last 168 hours. BNP (last 3 results) No results for input(s): PROBNP in the last 8760 hours. HbA1C: No results for input(s): HGBA1C in the last 72 hours. CBG: Recent Labs  Lab 04/27/19 2323 04/28/19 0104 04/28/19 0341 04/28/19 0737 04/28/19 1133  GLUCAP 204* 218* 184* 212* 148*   Lipid Profile: No results for input(s): CHOL, HDL, LDLCALC, TRIG, CHOLHDL, LDLDIRECT in the last 72 hours. Thyroid Function Tests: No results for input(s): TSH, T4TOTAL, FREET4, T3FREE, THYROIDAB in the last 72 hours. Anemia Panel: Recent Labs    04/27/19 0412  FERRITIN 40   Sepsis Labs: Recent Labs  Lab 04/26/19 1905 04/27/19 0412 04/27/19 1458  LATICACIDVEN 1.4 1.2 1.2    Recent Results (from the past 240 hour(s))  SARS CORONAVIRUS 2 (TAT 6-24 HRS) Nasopharyngeal Nasopharyngeal Swab     Status: None   Collection Time: 04/26/19  7:05 PM   Specimen: Nasopharyngeal Swab  Result Value Ref Range Status   SARS Coronavirus 2 NEGATIVE NEGATIVE Final    Comment: (NOTE) SARS-CoV-2 target nucleic acids are NOT DETECTED. The SARS-CoV-2 RNA is generally detectable in upper and lower respiratory specimens during the acute phase of infection.  Negative results do not preclude SARS-CoV-2 infection, do not rule out co-infections with other pathogens, and should not be used as the sole basis for treatment or other patient management decisions. Negative results must be combined with clinical observations, patient history, and epidemiological information. The expected result is Negative. Fact Sheet for Patients: SugarRoll.be Fact Sheet for Healthcare Providers: https://www.woods-mathews.com/ This test is not yet approved or cleared by the [place] FDA and  has been authorized for detection and/or diagnosis of SARS-CoV-2 by FDA under an Emergency Use Authorization (EUA). This EUA will remain  in effect (meaning this test can be used) for the duration of the COVID-19 declaration under Section 56 4(b)(1) of the Act, 21 U.S.C. section 360bbb-3(b)(1), unless the authorization is terminated or revoked sooner. Performed at [facility], [place] [address] [place], [place] [address]   Culture, blood (routine x 2) Call MD if unable to obtain prior to antibiotics being given     Status: None (Preliminary result)   Collection Time: 04/27/19  4:12 AM   Specimen: BLOOD  Result Value Ref Range Status   Specimen Description BLOOD RIGHT ANTECUBITAL  Final   Special Requests   Final    BOTTLES DRAWN AEROBIC AND ANAEROBIC Blood Culture results may not be optimal due to an excessive volume of blood received in culture bottles   Culture   Final    NO GROWTH <12 HOURS Performed at [facility], [address], [place], [place] [address]    Report Status PENDING  Incomplete  Culture, blood (routine x 2) Call MD if unable to obtain prior to antibiotics being given     Status: None (Preliminary result)   Collection Time: 04/27/19  4:12 AM   Specimen: BLOOD  Result Value Ref Range Status   Specimen Description BLOOD RIGHT HAND  Final   Special Requests   Final    BOTTLES DRAWN AEROBIC AND  ANAEROBIC Blood Culture results may not be optimal due to an excessive volume of blood received in culture bottles   Culture   Final    NO GROWTH < 12 HOURS Performed at [facility], [address] [place], [place] [address]    Report Status PENDING  Incomplete  MRSA PCR Screening     Status: None   Collection Time: 04/28/19  1:14 AM   Specimen: Nasal Mucosa; Nasopharyngeal  Result Value Ref Range Status   MRSA by PCR NEGATIVE NEGATIVE Final    Comment:        The GeneXpert MRSA Assay (FDA approved for NASAL specimens only), is one component of a comprehensive MRSA colonization surveillance program. It is not intended to diagnose MRSA infection nor to guide or monitor treatment for MRSA infections. Performed at [facility], [place] [address], [place], [place] [address]       Radiology Studies: Dg Chest 1 View  Result Date: 04/26/2019 CLINICAL DATA:  Left hip pain and leg pain. EXAM: CHEST  1 VIEW COMPARISON:  Chest x-ray 12/04/2018 FINDINGS: Heart size remains enlarged following median sternotomy with signs of aortic atherosclerosis. Increased interstitial markings bilaterally with more pronounced opacity in the left chest, left upper lobe. No signs of pleural effusion. Lungs are less hyperinflated than before. No acute bone finding. IMPRESSION: Findings that are suspicious for atypical pneumonia versus asymmetric pulmonary edema. Cardiomegaly and changes of sternotomy as before. COPD Electronically Signed   By: Zetta Bills M.D.   On: 04/26/2019 19:34   Ct Chest W Contrast  Result Date: 04/26/2019 CLINICAL DATA:  Interstitial lung disease. New infiltrates on chest x-ray. Abdominal distension with concern for bowel obstruction. EXAM: CT CHEST, ABDOMEN, AND PELVIS WITH CONTRAST TECHNIQUE: Multidetector CT imaging of the chest, abdomen and pelvis was performed following the standard protocol during bolus administration of intravenous contrast. CONTRAST:  47mL  OMNIPAQUE IOHEXOL 300 MG/ML  SOLN COMPARISON:  CT chest dated December 04, 2018. CT of the pelvis dated 05/24/2013 FINDINGS: CT CHEST FINDINGS Cardiovascular: There is no evidence for large centrally located pulmonary embolism. Detection of smaller segmental and subsegmental  pulmonary emboli is limited by technique and contrast bolus timing. Advanced aortic calcifications are noted. The main pulmonary artery is dilated. The main right and left pulmonary arteries are dilated. The heart is not significantly enlarged. There is no pericardial effusion. Mediastinum/Nodes: --No mediastinal or hilar lymphadenopathy. --No axillary lymphadenopathy. --No supraclavicular lymphadenopathy. --Normal thyroid gland. --The esophagus is unremarkable Lungs/Pleura: There are multifocal areas of consolidation, greatest within the left upper and bilateral lower lobes. There is scarring versus atelectasis at the lung bases. There is no pneumothorax. There are trace bilateral pleural effusions. Musculoskeletal: No chest wall abnormality. No acute or significant osseous findings. CT ABDOMEN PELVIS FINDINGS Hepatobiliary: The liver is normal. Normal gallbladder.There is no biliary ductal dilation. Pancreas: Normal contours without ductal dilatation. No peripancreatic fluid collection. Spleen: No splenic laceration or hematoma. Adrenals/Urinary Tract: --Adrenal glands: No adrenal hemorrhage. --Right kidney/ureter: No hydronephrosis or perinephric hematoma. --Left kidney/ureter: No hydronephrosis or perinephric hematoma. --Urinary bladder: The urinary bladder is moderately distended. Stomach/Bowel: --Stomach/Duodenum: The stomach is distended with an air-fluid level. --Small bowel: There are dilated loops of small bowel in the low midline abdomen and pelvis measuring up to approximately 3.8 cm in diameter. This appears to be secondary to an obturator hernia through the left obturator foramen. --Colon: Rectosigmoid diverticulosis without acute  inflammation. --Appendix: Not visualized. No right lower quadrant inflammation or free fluid. Vascular/Lymphatic: Atherosclerotic calcification is present within the non-aneurysmal abdominal aorta, without hemodynamically significant stenosis. --No retroperitoneal lymphadenopathy. --No mesenteric lymphadenopathy. --No pelvic or inguinal lymphadenopathy. Reproductive: Status post hysterectomy. No adnexal mass. Other: There is a small amount of free fluid in the patient's abdomen and pelvis. There is no significant free air. Musculoskeletal. No acute displaced fractures. IMPRESSION: 1. Small-bowel obstruction likely secondary to an obturator hernia through the left obturator foramen. 2. Multifocal pneumonia (viral or bacterial), greatest within the left upper lobe. 3. Trace bilateral pleural effusions. 4. Moderately distended urinary bladder. 5. Sigmoid diverticulosis without CT evidence for diverticulitis. 6. Aortic atherosclerosis. Aortic Atherosclerosis (ICD10-I70.0). Electronically Signed   By: Constance Holster M.D.   On: 04/26/2019 23:34   Ct Abdomen Pelvis W Contrast  Result Date: 04/26/2019 CLINICAL DATA:  Interstitial lung disease. New infiltrates on chest x-ray. Abdominal distension with concern for bowel obstruction. EXAM: CT CHEST, ABDOMEN, AND PELVIS WITH CONTRAST TECHNIQUE: Multidetector CT imaging of the chest, abdomen and pelvis was performed following the standard protocol during bolus administration of intravenous contrast. CONTRAST:  53mL OMNIPAQUE IOHEXOL 300 MG/ML  SOLN COMPARISON:  CT chest dated December 04, 2018. CT of the pelvis dated 05/24/2013 FINDINGS: CT CHEST FINDINGS Cardiovascular: There is no evidence for large centrally located pulmonary embolism. Detection of smaller segmental and subsegmental pulmonary emboli is limited by technique and contrast bolus timing. Advanced aortic calcifications are noted. The main pulmonary artery is dilated. The main right and left pulmonary arteries  are dilated. The heart is not significantly enlarged. There is no pericardial effusion. Mediastinum/Nodes: --No mediastinal or hilar lymphadenopathy. --No axillary lymphadenopathy. --No supraclavicular lymphadenopathy. --Normal thyroid gland. --The esophagus is unremarkable Lungs/Pleura: There are multifocal areas of consolidation, greatest within the left upper and bilateral lower lobes. There is scarring versus atelectasis at the lung bases. There is no pneumothorax. There are trace bilateral pleural effusions. Musculoskeletal: No chest wall abnormality. No acute or significant osseous findings. CT ABDOMEN PELVIS FINDINGS Hepatobiliary: The liver is normal. Normal gallbladder.There is no biliary ductal dilation. Pancreas: Normal contours without ductal dilatation. No peripancreatic fluid collection. Spleen: No splenic laceration or hematoma. Adrenals/Urinary Tract: --  Adrenal glands: No adrenal hemorrhage. --Right kidney/ureter: No hydronephrosis or perinephric hematoma. --Left kidney/ureter: No hydronephrosis or perinephric hematoma. --Urinary bladder: The urinary bladder is moderately distended. Stomach/Bowel: --Stomach/Duodenum: The stomach is distended with an air-fluid level. --Small bowel: There are dilated loops of small bowel in the low midline abdomen and pelvis measuring up to approximately 3.8 cm in diameter. This appears to be secondary to an obturator hernia through the left obturator foramen. --Colon: Rectosigmoid diverticulosis without acute inflammation. --Appendix: Not visualized. No right lower quadrant inflammation or free fluid. Vascular/Lymphatic: Atherosclerotic calcification is present within the non-aneurysmal abdominal aorta, without hemodynamically significant stenosis. --No retroperitoneal lymphadenopathy. --No mesenteric lymphadenopathy. --No pelvic or inguinal lymphadenopathy. Reproductive: Status post hysterectomy. No adnexal mass. Other: There is a small amount of free fluid in the  patient's abdomen and pelvis. There is no significant free air. Musculoskeletal. No acute displaced fractures. IMPRESSION: 1. Small-bowel obstruction likely secondary to an obturator hernia through the left obturator foramen. 2. Multifocal pneumonia (viral or bacterial), greatest within the left upper lobe. 3. Trace bilateral pleural effusions. 4. Moderately distended urinary bladder. 5. Sigmoid diverticulosis without CT evidence for diverticulitis. 6. Aortic atherosclerosis. Aortic Atherosclerosis (ICD10-I70.0). Electronically Signed   By: Constance Holster M.D.   On: 04/26/2019 23:34   US Venous Img Lower Unilateral Left (dvt)  Result Date: 04/27/2019 CLINICAL DATA:  Left lower extremity pain EXAM: LEFT LOWER EXTREMITY VENOUS DOPPLER ULTRASOUND TECHNIQUE: Gray-scale sonography with graded compression, as well as color Doppler and duplex ultrasound were performed to evaluate the lower extremity deep venous systems from the level of the common femoral vein and including the common femoral, femoral, profunda femoral, popliteal and calf veins including the posterior tibial, peroneal and gastrocnemius veins when visible. The superficial great saphenous vein was also interrogated. Spectral Doppler was utilized to evaluate flow at rest and with distal augmentation maneuvers in the common femoral, femoral and popliteal veins. COMPARISON:  None. FINDINGS: Contralateral Common Femoral Vein: Not assessed. Common Femoral Vein: No evidence of thrombus. Normal compressibility, respiratory phasicity and response to augmentation. Saphenofemoral Junction: No evidence of thrombus. Normal compressibility and flow on color Doppler imaging. Profunda Femoral Vein: No evidence of thrombus. Normal compressibility and flow on color Doppler imaging. Femoral Vein: No evidence of thrombus. Normal compressibility, respiratory phasicity and response to augmentation. Popliteal Vein: No evidence of thrombus. Normal compressibility,  respiratory phasicity and response to augmentation. Calf Veins: No evidence of thrombus. Normal compressibility and flow on color Doppler imaging. Superficial Great Saphenous Vein: No evidence of thrombus. Normal compressibility. Venous Reflux:  None. Other Findings:  None. IMPRESSION: No evidence of deep venous thrombosis. Electronically Signed   By: Rolm Baptise M.D.   On: 04/27/2019 02:52   US Arterial Lower Extremity Duplex Left (non-abi)  Result Date: 04/27/2019 CLINICAL DATA:  Left lower extremity pain, peripheral vascular disease, hypertension, diabetes and coronary artery disease. EXAM: LEFT LOWER EXTREMITY ARTERIAL DUPLEX SCAN TECHNIQUE: Gray-scale sonography as well as color Doppler and duplex ultrasound was performed to evaluate the lower extremity arteries including the common, superficial and profunda femoral arteries, popliteal artery and calf arteries. COMPARISON:  None. FINDINGS: Left lower Extremity Inflow: The common femoral artery demonstrates moderate eccentric calcified plaque without visible significant luminal stenosis. Arterial waveform is triphasic. Outflow: Normal velocities. SFA waveform is biphasic. Profunda femoral waveform is triphasic. Calcified plaque is seen throughout the SFA. Popliteal artery is patent with biphasic waveform. Mild calcified plaque is present in the popliteal artery. Runoff: Limited tibial artery evaluation with anterior and  posterior tibial arteries demonstrating normal velocities, biphasic waveforms and scattered plaque. posterior and anterior tibial arterial waveforms and velocities. Vessels are patent to the ankle. IMPRESSION: Diffuse atherosclerosis of left lower extremity arteries without focal occlusion or abnormal velocities detected by duplex ultrasound. Electronically Signed   By: Aletta Edouard M.D.   On: 04/27/2019 09:03   Dg Abd Portable 2 Views  Result Date: 04/26/2019 CLINICAL DATA:  Dilated bowel for further workup EXAM: PORTABLE ABDOMEN -  2 VIEW COMPARISON:  Hip radiographs 04/26/2019 FINDINGS: Again we demonstrate a dilated loop of small bowel in the right lower quadrant, measuring about 4.1 cm in diameter. There is formed stool in the colon. The upright abdomen radiograph excludes the pelvis, the dilated loop is seen but month certain if there are associated air-fluid levels because the pelvis is excluded. Aortic and splenic artery atherosclerosis. Small vessel stent in the abdomen, possibly of the left renal artery. Left hip hemiarthroplasty. Bony demineralization. The upright view includes some of the chest and demonstrates indistinct left perihilar airspace opacity as well as bilateral interstitial opacity in the lungs. Prior CABG noted. IMPRESSION: 1. Abnormally dilated loop of small bowel in the right lower quadrant, similar to prior appearance the. The appearance is abnormal but nonspecific and could represent local ileus or early obstruction. However, there is formed stool in the colon. 2. Airspace opacity in the left perihilar region with bilateral interstitial accentuation, possibilities include asymmetric edema or pneumonia. 3. Mild enlargement of the cardiopericardial silhouette 4. Atherosclerosis. 5. Bony demineralization. Electronically Signed   By: Van Clines M.D.   On: 04/26/2019 20:13   Dg Hip Unilat W Or Wo Pelvis 2-3 Views Left  Result Date: 04/26/2019 CLINICAL DATA:  Left hip and leg pain EXAM: DG HIP (WITH OR WITHOUT PELVIS) 2-3V LEFT COMPARISON:  Femoral exam of the same date, postoperative exam of 11/29/2018 FINDINGS: Signs of left hip arthroplasty. No signs of periprosthetic fracture. Osteopenia with minimal degenerative changes in the right hip. No signs of pelvic fracture with evidence of calcified atherosclerotic changes overlying the pelvis. Focal bowel dilation in the right lower quadrant, small bowel loops up to 4 cm are incidentally noted. There is evidence of some stool and gas in the rectum. IMPRESSION:  1. Left hip arthroplasty without signs of acute fracture or dislocation. 2. Focal bowel dilation in the right lower quadrant, small bowel loops up to 4 cm are incidentally noted, consider dedicated abdominal plain film to exclude ileus versus early small bowel obstruction. 3. Signs of calcified atherosclerotic changes in the pelvis. Electronically Signed   By: Zetta Bills M.D.   On: 04/26/2019 19:37   Dg Femur Min 2 Views Left  Result Date: 04/26/2019 CLINICAL DATA:  Leg pain, left leg. EXAM: LEFT FEMUR 2 VIEWS COMPARISON:  11/29/2018 FINDINGS: Post left hip arthroplasty without signs of periprosthetic fracture. Signs of vascular calcification along superficial femoral artery course. Osteopenia. IMPRESSION: Post left hip arthroplasty. No acute abnormality. Electronically Signed   By: Zetta Bills M.D.   On: 04/26/2019 19:30     Scheduled Meds:  acetaminophen  1,000 mg Oral Q6H   chlorhexidine  15 mL Mouth Rinse BID   Chlorhexidine Gluconate Cloth  6 each Topical Daily   docusate sodium  100 mg Oral BID   gabapentin  300 mg Oral BID   heparin  5,000 Units Subcutaneous Q12H   insulin aspart  0-9 Units Subcutaneous Q4H   metoprolol succinate  12.5 mg Oral QPM   Continuous Infusions:  sodium chloride 75 mL/hr at 04/28/19 1000   diltiazem (CARDIZEM) infusion 5 mg/hr (04/28/19 1000)   levofloxacin (LEVAQUIN) IV Stopped (04/28/19 0708)     LOS: 1 day    Time spent: 35 minutes spent in the coordination of care today.    Jonnie Finner, DO [facility] Pager [phone]  If 7PM-7AM, please contact night-coverage [email or web address] Password [email or web address] 04/28/2019, 2:52 PM

## 2019-04-29 LAB — COMPREHENSIVE METABOLIC PANEL
ALT: 12 U/L (ref 0–44)
AST: 17 U/L (ref 15–41)
Albumin: 2.5 g/dL — ABNORMAL LOW (ref 3.5–5.0)
Alkaline Phosphatase: 33 U/L — ABNORMAL LOW (ref 38–126)
Anion gap: 7 (ref 5–15)
BUN: 20 mg/dL (ref 8–23)
CO2: 26 mmol/L (ref 22–32)
Calcium: 9.1 mg/dL (ref 8.9–10.3)
Chloride: 107 mmol/L (ref 98–111)
Creatinine, Ser: 0.67 mg/dL (ref 0.44–1.00)
GFR calc Af Amer: >60 mL/min (ref >60)
GFR calc non Af Amer: >60 mL/min (ref >60)
Glucose, Bld: 99 mg/dL (ref 70–99)
Potassium: 3.6 mmol/L (ref 3.5–5.1)
Sodium: 140 mmol/L (ref 135–145)
Total Bilirubin: 0.5 mg/dL (ref 0.3–1.2)
Total Protein: 4.7 g/dL — ABNORMAL LOW (ref 6.5–8.1)

## 2019-04-29 LAB — CBC
HCT: 26.8 % — ABNORMAL LOW (ref 36.0–46.0)
Hemoglobin: 8.6 g/dL — ABNORMAL LOW (ref 12.0–15.0)
MCH: 29 pg (ref 26.0–34.0)
MCHC: 32.1 g/dL (ref 30.0–36.0)
MCV: 90.2 fL (ref 80.0–100.0)
Platelets: 152 10*3/uL (ref 150–400)
RBC: 2.97 MIL/uL — ABNORMAL LOW (ref 3.87–5.11)
RDW: 15.2 % (ref 11.5–15.5)
WBC: 10.4 10*3/uL (ref 4.0–10.5)
nRBC: 0 % (ref 0.0–0.2)

## 2019-04-29 LAB — GLUCOSE, CAPILLARY
Glucose-Capillary: 106 mg/dL — ABNORMAL HIGH (ref 70–99)
Glucose-Capillary: 95 mg/dL (ref 70–99)
Glucose-Capillary: 124 mg/dL — ABNORMAL HIGH (ref 70–99)
Glucose-Capillary: 93 mg/dL (ref 70–99)
Glucose-Capillary: 96 mg/dL (ref 70–99)

## 2019-04-29 LAB — HEMOGLOBIN A1C
Hgb A1c MFr Bld: 6.4 % — ABNORMAL HIGH (ref 4.8–5.6)
Mean Plasma Glucose: 136.98 mg/dL

## 2019-04-29 LAB — MAGNESIUM: Magnesium: 1.8 mg/dL (ref 1.7–2.4)

## 2019-04-29 LAB — PHOSPHORUS: Phosphorus: 2.5 mg/dL (ref 2.5–4.6)

## 2019-04-29 MED ORDER — DOXYCYCLINE HYCLATE 100 MG PO TABS
100.0000 mg | ORAL_TABLET | Freq: Two times a day (BID) | ORAL | Status: DC
  Administered 2019-04-30 – 2019-05-02 (×4): 100 mg via ORAL
  Filled 2019-04-29 (×4): qty 1

## 2019-04-29 MED ORDER — ACETAMINOPHEN 325 MG PO TABS
650.0000 mg | ORAL_TABLET | Freq: Four times a day (QID) | ORAL | Status: DC
  Administered 2019-04-29 – 2019-05-02 (×12): 650 mg via ORAL
  Filled 2019-04-29 (×11): qty 2

## 2019-04-29 NOTE — Progress Notes (Signed)
Report called to [facility] nurse and patient taken to floor tolerated well

## 2019-04-29 NOTE — Progress Notes (Signed)
Physical Therapy Treatment Patient Details Name: Yolanda Turner MRN: [id number] DOB: June 17, 1925 Today's Date: 04/29/2019    History of Present Illness Pt is a 85 y/o female with PMH of CAD post MI, type 2 DM, PVD, HTN, CVA, presented to [facility] with L Leg pain on 04/26/19. Further work up revealed SBO due to incarcerated obturator hernia, and pneumonia. S/p reduction of obturator hernia 11/14.     PT Comments    Pt agreeable to get up with therapy. Pt's granddaughter, with whom she lives, in room and pt looks to her to answer questions. Pt limited in safe mobility by L LE pain, in presence of decreased strength, balance and endurance. Pt is maxAx2 for bed mobility, modAx2 for transfers and modAx2 for ambulation of 16 feet with 2 person HHA. D/c plans remain appropriate at this time. PT will continue to follow acutely.   Follow Up Recommendations  Home health PT;Supervision/Assistance - 24 hour(HHaide)     Equipment Recommendations  None recommended by PT       Precautions / Restrictions Precautions Precautions: Fall Restrictions Weight Bearing Restrictions: No    Mobility  Bed Mobility Overal bed mobility: Needs Assistance Bed Mobility: Rolling;Sidelying to Sit Rolling: Max assist Sidelying to sit: Max assist;+2 for physical assistance;+2 for safety/equipment       General bed mobility comments: MaxAx2 for scooting to R side of bed and then for coming into sidelying, working LE off bed and bringing trunk to upright  Transfers Overall transfer level: Needs assistance Equipment used: 2 person hand held assist Transfers: Sit to/from [facility] Sit to Stand: +2 safety/equipment;Mod assist         General transfer comment: modAx2 for power up and steadying in standing.   Ambulation/Gait Ambulation/Gait assistance: Mod assist;+2 physical assistance Gait Distance (Feet): 16 Feet Assistive device: 2 person hand held assist Gait Pattern/deviations:  Step-through pattern;Decreased step length - right;Decreased step length - left;Narrow base of support;Shuffle;Antalgic;Decreased weight shift to left Gait velocity: slowed Gait velocity interpretation: <1.31 ft/sec, indicative of household ambulator General Gait Details: modAx2 for HHA and support across back with use of gait belt. Pt with increased support on L due to decreased weightshift to L LE and increased R lateral lean, vc for increased base of support.          Balance Overall balance assessment: Needs assistance Sitting-balance support: Feet supported;No upper extremity supported Sitting balance-Leahy Scale: Poor Sitting balance - Comments: R lateral lean with unsupported sitting  Postural control: Right lateral lean Standing balance support: Bilateral upper extremity supported Standing balance-Leahy Scale: Poor                              Cognition Arousal/Alertness: Lethargic Behavior During Therapy: Flat affect Overall Cognitive Status: Difficult to assess Area of Impairment: Following commands                       Following Commands: Follows one step commands with increased time;Follows one step commands inconsistently       General Comments: patient with limited verbalizations during session, will answer yes/no questions and looks to grand daughter to speak for herhx of expressive aphasia from CVA         General Comments General comments (skin integrity, edema, etc.): Pt on 2L O2 via [place] on entry with SaO2 97%O2,  removed  [place] to move pt and SaO2 dropped to 86%O2, 2L O2 via  [place] replaced and pt able to ambulate and maintain O2 93-97%O2      Pertinent Vitals/Pain Pain Assessment: Faces Faces Pain Scale: Hurts little more Pain Location: L LE movement  Pain Descriptors / Indicators: Discomfort;Grimacing;Moaning Pain Intervention(s): Limited activity within patient's tolerance;Monitored during session;Repositioned           PT Goals  (current goals can now be found in the care plan section) Acute Rehab PT Goals Patient Stated Goal: to get back home  PT Goal Formulation: With patient/family Time For Goal Achievement: 05/12/19 Potential to Achieve Goals: Fair Progress towards PT goals: Progressing toward goals    Frequency    Min 3X/week      PT Plan Current plan remains appropriate    Co-evaluation PT/OT/SLP Co-Evaluation/Treatment: Yes Reason for Co-Treatment: For patient/therapist safety PT goals addressed during session: Mobility/safety with mobility OT goals addressed during session: Strengthening/ROM;ADL's and self-care      AM-PAC PT "6 Clicks" Mobility   Outcome Measure  Help needed turning from your back to your side while in a flat bed without using bedrails?: Total Help needed moving from lying on your back to sitting on the side of a flat bed without using bedrails?: Total Help needed moving to and from a bed to a chair (including a wheelchair)?: Total Help needed standing up from a chair using your arms (e.g., wheelchair or bedside chair)?: Total Help needed to walk in hospital room?: Total Help needed climbing 3-5 steps with a railing? : Total 6 Click Score: 6    End of Session Equipment Utilized During Treatment: Gait belt;Oxygen Activity Tolerance: Patient limited by fatigue;Patient limited by pain Patient left: in chair;with call bell/phone within reach;with family/visitor present Nurse Communication: Mobility status PT Visit Diagnosis: Muscle weakness (generalized) (M62.81);Difficulty in walking, not elsewhere classified (R26.2)     Time: [id number] PT Time Calculation (min) (ACUTE ONLY): 23 min  Charges:  $Gait Training: 8-22 mins                     Yolanda Turner B. Migdalia Dk PT, DPT Acute Rehabilitation Services Pager [phone] Office [phone]    Yolanda Turner 04/29/2019, 4:26 PM

## 2019-04-29 NOTE — Progress Notes (Signed)
Occupational Therapy Treatment Patient Details Name: Yolanda Turner MRN: [id number] DOB: 1925/07/24 Today's Date: 04/29/2019    History of present illness Pt is a 85 y/o female with PMH of CAD post MI, type 2 DM, PVD, HTN, CVA, presented to [facility] with L Leg pain on 04/26/19. Further work up revealed SBO due to incarcerated obturator hernia, and pneumonia. S/p reduction of obturator hernia 11/14.    OT comments  This 85 yo female admitted with above presents to acute OT with making progress with bed mobility, able to ambulate short distance today with Bil HHA, attempting to work on LBD. She will continue to benefit from acute OT with follow up [place].  Follow Up Recommendations  Home health OT;Supervision/Assistance - 24 hour    Equipment Recommendations  3 in 1 bedside commode       Precautions / Restrictions Precautions Precautions: Fall Restrictions Weight Bearing Restrictions: No       Mobility Bed Mobility Overal bed mobility: Needs Assistance Bed Mobility: Rolling;Sidelying to Sit Rolling: Max assist Sidelying to sit: Max assist;+2 for physical assistance;+2 for safety/equipment       General bed mobility comments: MaxAx2 for scooting to R side of bed and then for coming into sidelying, working LE off bed and bringing trunk to upright  Transfers Overall transfer level: Needs assistance Equipment used: 2 person hand held assist Transfers: Sit to/from Stand Sit to Stand: +2 safety/equipment;Mod assist         General transfer comment: modAx2 for power up and steadying in standing. Feet tend to want to slid forward with sit>stand    Balance Overall balance assessment: Needs assistance Sitting-balance support: Feet supported;No upper extremity supported Sitting balance-Leahy Scale: Poor Sitting balance - Comments: R lateral lean with unsupported sitting  Postural control: Right lateral lean Standing balance support: Bilateral upper extremity supported Standing  balance-Leahy Scale: Poor Standing balance comment: relaint on external support                           ADL either performed or assessed with clinical judgement   ADL Overall ADL's : Needs assistance/impaired                         Toilet Transfer: Moderate assistance;+2 for physical assistance Toilet Transfer Details (indicate cue type and reason): Bil HHA, bed>out into unit>chair behind her Toileting- Water quality scientist and Hygiene: Total assistance Toileting - Clothing Manipulation Details (indicate cue type and reason): Mod A +2 sit<>stand             Vision Patient Visual Report: No change from baseline            Cognition Arousal/Alertness: Awake/alert Behavior During Therapy: Flat affect Overall Cognitive Status: Difficult to assess Area of Impairment: Following commands                       Following Commands: Follows one step commands with increased time;Follows one step commands inconsistently       General Comments: patient with limited verbalizations during session, will answer yes/no questions and looks to grand daughter to speak for her hx of expressive aphasia from CVA              General Comments Pt on 2L O2 via [place] on entry with SaO2 97%O2,  removed  [place] to move pt and SaO2 dropped to 86%O2, 2L O2 via [place] replaced and pt  able to ambulate and maintain O2 93-97%O2    Pertinent Vitals/ Pain       Pain Assessment: Faces Faces Pain Scale: Hurts little more Pain Location: L LE movement  Pain Descriptors / Indicators: Discomfort;Grimacing;Moaning Pain Intervention(s): Limited activity within patient's tolerance;Monitored during session;Repositioned         Frequency  Min 3X/week        Progress Toward Goals  OT Goals(current goals can now be found in the care plan section)  Progress towards OT goals: Progressing toward goals  Acute Rehab OT Goals Patient Stated Goal: to get back home   Plan Discharge  plan remains appropriate    Co-evaluation    PT/OT/SLP Co-Evaluation/Treatment: Yes Reason for Co-Treatment: For patient/therapist safety PT goals addressed during session: Mobility/safety with mobility OT goals addressed during session: ADL's and self-care;Strengthening/ROM      AM-PAC OT "6 Clicks" Daily Activity     Outcome Measure   Help from another person eating meals?: A Lot Help from another person taking care of personal grooming?: A Lot Help from another person toileting, which includes using toliet, bedpan, or urinal?: Total Help from another person bathing (including washing, rinsing, drying)?: Total Help from another person to put on and taking off regular upper body clothing?: Total Help from another person to put on and taking off regular lower body clothing?: Total 6 Click Score: 8    End of Session Equipment Utilized During Treatment: Oxygen  OT Visit Diagnosis: Other abnormalities of gait and mobility (R26.89);Muscle weakness (generalized) (M62.81);Pain;Cognitive communication deficit (R41.841) Symptoms and signs involving cognitive functions: Cerebral infarction(old)   Activity Tolerance Patient limited by pain;Patient limited by lethargy   Patient Left in chair;with call bell/phone within reach   Nurse Communication Mobility status        Time: 1228-1258 OT Time Calculation (min): 30 min  Charges: OT General Charges $OT Visit: 1 Visit OT Treatments $Self Care/Home Management : 8-22 mins  [place], OTR/L Acute [employer] Pager [phone] Office [phone]      Almon Register 04/29/2019, 4:53 PM

## 2019-04-29 NOTE — Progress Notes (Signed)
[facility]/Trauma Progress Note  2 Days Post-Op   Assessment/Plan CVA H/o CABA PVD MI GERD DM CAD  Obturator hernia - S/P Exploratory laparotomy, reduction of incarcerated small bowel from obturator hernia, primary closure of peritoneum to obliterate the obturator hernia, Dr. Kae Heller, 11/14 - tolerating CLD - continue foley in setting of low urine output  FEN: CLD until having flatus VTE: SCD's, heparin ID: Levaquin 11/14>> Foley: conitnue Follow up: Dr. Kae Heller  DISPO: IS, ambulate, breeze, CLD until flatus, ok from our standpoint to move out of ICU but will defer to primary   LOS: 2 days    Subjective: CC: mild abdominal pain  Drinking her CLD tray. No flatus per patient. No nausea or vomiting. Minimal abdominal pain.   Objective: Vital signs in last 24 hours: Temp:  [97.4 F (36.3 C)-98.4 F (36.9 C)] 98.1 F (36.7 C) (11/16 0752) Pulse Rate:  [69-93] 80 (11/16 0800) Resp:  [16-32] 24 (11/16 0800) BP: (108-146)/(39-68) 135/57 (11/16 0800) SpO2:  [97 %-100 %] 98 % (11/16 0800) Arterial Line BP: (145)/(41) 145/41 (11/15 0900) Last BM Date: 04/27/19  Intake/Output from previous day: 11/15 0701 - 11/16 0700 In: 1931.3 [I.V.:1817.7; IV Piggyback:113.6] Out: 350 [Urine:350] Intake/Output this shift: No intake/output data recorded.  PE:  Gen:  Alert, NAD, pleasant Pulm:  Rate and effort normal Abd: Soft, ND, +BS, incisions C/D/I, very minimal TTP Skin: no rashes noted, warm and dry  Anti-infectives: Anti-infectives (From admission, onward)   Start     Dose/Rate Route Frequency Ordered Stop   04/28/19 0500  levofloxacin (LEVAQUIN) IVPB 750 mg     750 mg 100 mL/hr over 90 Minutes Intravenous Every 48 hours 04/28/19 0424     04/27/19 2115  vancomycin (VANCOCIN) 500 mg in sodium chloride 0.9 % 100 mL IVPB     500 mg 100 mL/hr over 60 Minutes Intravenous To Surgery 04/27/19 2104 04/27/19 2342      Lab Results:  Recent Labs     04/28/19 0426 04/29/19 0727  WBC 20.2* 10.4  HGB 10.3* 8.6*  HCT 30.8* 26.8*  PLT 230 152   BMET Recent Labs    04/28/19 0426 04/29/19 0727  NA 138 140  K 3.5 3.6  CL 100 107  CO2 26 26  GLUCOSE 192* 99  BUN 17 20  CREATININE 0.73 0.67  CALCIUM 9.5 9.1   PT/INR No results for input(s): LABPROT, INR in the last 72 hours. CMP     Component Value Date/Time   NA 140 04/29/2019 0727   NA 139 05/23/2013 1851   K 3.6 04/29/2019 0727   K 4.7 05/23/2013 1851   CL 107 04/29/2019 0727   CL 106 05/23/2013 1851   CO2 26 04/29/2019 0727   CO2 30 05/23/2013 1851   GLUCOSE 99 04/29/2019 0727   GLUCOSE 246 (H) 05/23/2013 1851   BUN 20 04/29/2019 0727   BUN 22 (H) 05/23/2013 1851   CREATININE 0.67 04/29/2019 0727   CREATININE 0.88 05/23/2013 1851   CALCIUM 9.1 04/29/2019 0727   CALCIUM 9.3 05/23/2013 1851   PROT 4.7 (L) 04/29/2019 0727   PROT 6.5 05/23/2013 1851   ALBUMIN 2.5 (L) 04/29/2019 0727   ALBUMIN 3.4 05/23/2013 1851   AST 17 04/29/2019 0727   AST 29 05/23/2013 1851   ALT 12 04/29/2019 0727   ALT 21 05/23/2013 1851   ALKPHOS 33 (L) 04/29/2019 0727   ALKPHOS 61 05/23/2013 1851   BILITOT 0.5 04/29/2019 0727   BILITOT 0.2 05/23/2013 1851  GFRNONAA >60 04/29/2019 0727   GFRNONAA 60 (L) 05/23/2013 1851   GFRAA >60 04/29/2019 0727   GFRAA >60 05/23/2013 1851   Lipase     Component Value Date/Time   LIPASE 31 05/02/2017 0305    Studies/Results: No results found.   Kalman Drape, PA-C [facility] Please see amion for pager for the following: Myna Hidalgo, W, & Friday 7:00am - 4:30pm Thursdays 7:00am -11:30am

## 2019-04-29 NOTE — Progress Notes (Signed)
Marland Kitchen  PROGRESS NOTE    Yolanda Turner  [id number] DOB: [birth year] DOA: 04/27/2019 PCP: Albina Billet, MD   Brief Narrative:   Yolanda Turner a 85 y.o.femalewith medical history significant ofCAD status post MI,type 2 diabetes peripheral vascular disease GERD hypertension CVA,who presented to who presented to [facility] with complaints of left leg pain on 04/26/2019. Patient patient on further work-up was found to have a small bowel obstruction due to an incarcerated obturator hernia. Patient was also found to have pneumonia with acute episode of hypoxemia documented in ED. Patient was admitted to hospitalist service and treated for pneumonia and supportively treated for the small bowel obstruction and surgery was consulted. Due to multiple comorbidities and current acute medical problems and need for surgical intervention it was recommended that patient be transferred to [facility] for further care due to possible need for critical care bed status post surgery. Patient is now status post uneventful surgery where incarcerated hernia was reduced. Patient describes had no surgical complications. Her respiratory status also throughout surgery had remained stable. Patient is placed in PCU for further postop care and monitoring  11/15: somewhat groggy this am. Says stomach is sore. 11/16: more interactive this AM. She says she is feeling better.   Assessment & Plan:   Active Problems:   Obturator hernia   Small bowel obstruction (HCC)  Obturator hernia incarcerated with bowel obstruction     - Status post surgical reduction     - Postsurgical care per surgery     - pain meds per surgery     - started on CLD     - quiet bowels today; still not passing gas  Community-acquired pneumonia with hypoxemia     - CXR noted     - currently on levaquin, continue     - Bld Cx neg to date     - Supportive care with supplemental O2 titrate off as able     - change to PO lvq in the AM   Hypertensive urgency     - continue metoprolol; BP is acceptable.  History of chronic systolic heart failure EF of around 25%     - No acute exacerbation at this time     - BNP 17; somewhat dry     - continue IVF today in supplementation of PO feeding; watch fluid status  Elevated troponin     - likely due to demand ischemia     - echo pending     - trp stable  A fib RVR     - ON went into a fib     - started on dilt gtt; titrate and convert to PO; consider cards consult     - now on PO dilt; she is controlled; monitor  Left heel ulcer     - WOCN consulted  Mild dementia     - Per prior hospitalist note Yolanda Turner daughter/granddaughter Yolanda Turner have decision-making power on patient's behalf.  Normocytic anemia     - down to 8.6 this AM, but no evidence of bleed; continue to monitor     - if she continues to trend down, hold heparin and switch to SCDs; then would check CT ab/pelvix  Hyperglycemia     - A1c: 6.4; she is pre-diabetic; rec follow up w/ PCP; will continue SSI for now  DVT prophylaxis: heparin Code Status: FULL Family Communication: Spoke with dtr by phone   Disposition Plan: TBD  Consultants:   General Surgery  Antimicrobials:  . Lvq   ROS:  Denies CP, N, V. Reports ab pain. Remainder 10-pt ROS is negative for all not previously mentioned.  Subjective: "Ok. Thank you."  Objective: Vitals:   04/29/19 1000 04/29/19 1100 04/29/19 1105 04/29/19 1200  BP: (!) 142/55 (!) 139/46  (!) 136/48  Pulse: 71 75  75  Resp: 20 (!) 28  (!) 23  Temp:   98.8 F (37.1 C)   TempSrc:   Oral   SpO2: 100% 99%  99%  Weight:        Intake/Output Summary (Last 24 hours) at 04/29/2019 1336 Last data filed at 04/29/2019 0800 Gross per 24 hour  Intake 1411.77 ml  Output 200 ml  Net 1211.77 ml   Filed Weights   04/28/19 0130  Weight: 33.4 kg    Examination:  General: 85 y.o. female resting in bed in NAD Cardiovascular: RRR, +S1, S2, no m/g/r Respiratory:  clear anteriorly, no w/r/r, normal WOB GI: BS hypoactive, ND, LLQ TTP MSK: No e/c/c Neuro: alert to name, follows commands   Data Reviewed: I have personally reviewed following labs and imaging studies.  CBC: Recent Labs  Lab 04/26/19 1905 04/27/19 0412 04/28/19 0426 04/29/19 0727  WBC 8.9 9.3 20.2* 10.4  NEUTROABS 7.7  --   --   --   HGB 11.5* 11.2* 10.3* 8.6*  HCT 36.6 33.5* 30.8* 26.8*  MCV 89.9 85.7 87.3 90.2  PLT 219 203 230 0000000   Basic Metabolic Panel: Recent Labs  Lab 04/26/19 1905 04/27/19 0412 04/28/19 0426 04/29/19 0727  NA 141 139 138 140  K 3.8 4.1 3.5 3.6  CL 104 102 100 107  CO2 25 27 26 26   GLUCOSE 188* 183* 192* 99  BUN 14 13 17 20   CREATININE 0.78 0.67 0.73 0.67  CALCIUM 9.5 9.3 9.5 9.1  MG  --   --   --  1.8  PHOS  --   --   --  2.5   GFR: Estimated Creatinine Clearance: 24.2 mL/min (by C-G formula based on SCr of 0.67 mg/dL). Liver Function Tests: Recent Labs  Lab 04/26/19 1905 04/29/19 0727  AST 21 17  ALT 13 12  ALKPHOS 61 33*  BILITOT 0.6 0.5  PROT 6.5 4.7*  ALBUMIN 3.8 2.5*   No results for input(s): LIPASE, AMYLASE in the last 168 hours. No results for input(s): AMMONIA in the last 168 hours. Coagulation Profile: No results for input(s): INR, PROTIME in the last 168 hours. Cardiac Enzymes: No results for input(s): CKTOTAL, CKMB, CKMBINDEX, TROPONINI in the last 168 hours. BNP (last 3 results) No results for input(s): PROBNP in the last 8760 hours. HbA1C: Recent Labs    04/29/19 0727  HGBA1C 6.4*   CBG: Recent Labs  Lab 04/28/19 1530 04/28/19 1947 04/28/19 2356 04/29/19 0407 04/29/19 0749  GLUCAP 153* 89 114* 106* 95   Lipid Profile: No results for input(s): CHOL, HDL, LDLCALC, TRIG, CHOLHDL, LDLDIRECT in the last 72 hours. Thyroid Function Tests: No results for input(s): TSH, T4TOTAL, FREET4, T3FREE, THYROIDAB in the last 72 hours. Anemia Panel: Recent Labs    04/27/19 0412  FERRITIN 40   Sepsis Labs:  Recent Labs  Lab 04/26/19 1905 04/27/19 0412 04/27/19 1458  LATICACIDVEN 1.4 1.2 1.2    Recent Results (from the past 240 hour(s))  SARS CORONAVIRUS 2 (TAT 6-24 HRS) Nasopharyngeal Nasopharyngeal Swab     Status: None   Collection Time: 04/26/19  7:05 PM   Specimen: Nasopharyngeal Swab  Result  Value Ref Range Status   SARS Coronavirus 2 NEGATIVE NEGATIVE Final    Comment: (NOTE) SARS-CoV-2 target nucleic acids are NOT DETECTED. The SARS-CoV-2 RNA is generally detectable in upper and lower respiratory specimens during the acute phase of infection. Negative results do not preclude SARS-CoV-2 infection, do not rule out co-infections with other pathogens, and should not be used as the sole basis for treatment or other patient management decisions. Negative results must be combined with clinical observations, patient history, and epidemiological information. The expected result is Negative. Fact Sheet for Patients: SugarRoll.be Fact Sheet for Healthcare Providers: https://www.woods-mathews.com/ This test is not yet approved or cleared by the [place] FDA and  has been authorized for detection and/or diagnosis of SARS-CoV-2 by FDA under an Emergency Use Authorization (EUA). This EUA will remain  in effect (meaning this test can be used) for the duration of the COVID-19 declaration under Section 56 4(b)(1) of the Act, 21 U.S.C. section 360bbb-3(b)(1), unless the authorization is terminated or revoked sooner. Performed at [facility], [place] [address], [place], [place] [address]   Culture, blood (routine x 2) Call MD if unable to obtain prior to antibiotics being given     Status: None (Preliminary result)   Collection Time: 04/27/19  4:12 AM   Specimen: BLOOD  Result Value Ref Range Status   Specimen Description BLOOD RIGHT ANTECUBITAL  Final   Special Requests   Final    BOTTLES DRAWN AEROBIC AND ANAEROBIC Blood Culture results  may not be optimal due to an excessive volume of blood received in culture bottles   Culture   Final    NO GROWTH 2 DAYS Performed at [facility], [address], [place], [place] [address]    Report Status PENDING  Incomplete  Culture, blood (routine x 2) Call MD if unable to obtain prior to antibiotics being given     Status: None (Preliminary result)   Collection Time: 04/27/19  4:12 AM   Specimen: BLOOD  Result Value Ref Range Status   Specimen Description BLOOD RIGHT HAND  Final   Special Requests   Final    BOTTLES DRAWN AEROBIC AND ANAEROBIC Blood Culture results may not be optimal due to an excessive volume of blood received in culture bottles   Culture   Final    NO GROWTH 2 DAYS Performed at [facility], [address] [place], [place] [address]    Report Status PENDING  Incomplete  MRSA PCR Screening     Status: None   Collection Time: 04/28/19  1:14 AM   Specimen: Nasal Mucosa; Nasopharyngeal  Result Value Ref Range Status   MRSA by PCR NEGATIVE NEGATIVE Final    Comment:        The GeneXpert MRSA Assay (FDA approved for NASAL specimens only), is one component of a comprehensive MRSA colonization surveillance program. It is not intended to diagnose MRSA infection nor to guide or monitor treatment for MRSA infections. Performed at [facility], [place] [address], [place],  [address]       Radiology Studies: No results found.   Scheduled Meds: . acetaminophen  650 mg Oral Q6H  . chlorhexidine  15 mL Mouth Rinse BID  . Chlorhexidine Gluconate Cloth  6 each Topical Daily  . diltiazem  90 mg Oral Q12H  . docusate sodium  100 mg Oral BID  . gabapentin  300 mg Oral BID  . heparin  5,000 Units Subcutaneous Q12H  . insulin  aspart  0-9 Units Subcutaneous Q4H  . metoprolol succinate  12.5 mg Oral QPM   Continuous Infusions: . sodium chloride 75 mL/hr at 04/29/19 0800  . levofloxacin (LEVAQUIN) IV Stopped (04/28/19 0708)      LOS: 2 days    Time spent: 25 minutes spent in the coordination of care today.    Jonnie Finner, DO [facility] Pager [phone]  If 7PM-7AM, please contact night-coverage [email or web address] Password [email or web address] 04/29/2019, 1:36 PM

## 2019-04-29 NOTE — Progress Notes (Signed)
NURSING PROGRESS NOTE  Yolanda Turner [id number] Transfer Data: 04/29/2019 6:42 PM Attending Provider: Jonnie Finner, DO [id number], Leona Carry, MD Code Status: FULL   Yolanda Turner is a 85 y.o. female patient transferred from 55M  -No acute distress noted.  -No complaints of shortness of breath.  -No complaints of chest pain.   Cardiac Monitoring: Box # M04 in place. Cardiac monitor yields:normal sinus rhythm.  Last Documented Vital Signs: Blood pressure (!) 146/54, pulse 74, temperature 98.4 F (36.9 C), temperature source Oral, resp. rate 20, weight 33.4 kg, SpO2 91 %.  IV Fluids:  IV in place, occlusive dsg intact without redness, IV cath forearm right, condition patent and no redness normal saline @ 75  Allergies:  Penicillins  Past Medical History:   has a past medical history of Carotid artery stenosis, Carotid artery stenosis, Collagen vascular disease ([place]), Coronary artery disease, Diabetes mellitus without complication ([place]), GERD (gastroesophageal reflux disease), Hypertension, Myocardial infarction ([place]), Osteoporosis, Peripheral vascular disease ([place]), Pneumonia, Renal artery stenosis ([place]), Status post double vessel coronary artery bypass (2005), Stroke [facility]), and Uterine cancer ([place]).  Past Surgical History:   has a past surgical history that includes uterine cancer surgery; Coronary artery bypass graft; Abdominal hysterectomy; Cardiac catheterization (Right, 01/22/2015); Carotid endarterectomy; Cataract extraction w/PHACO (Right, 09/28/2015); Hip Arthroplasty (Left, 11/29/2018); and laparotomy (N/A, 04/27/2019).  Social History:   reports that she has quit smoking. She has never used smokeless tobacco. She reports that she does not drink alcohol or use drugs.  Skin: intact except where otherwise charted  Patient/Family orientated to room. Information packet given to patient/family. Admission inpatient armband information verified with patient/family to include name and date  of birth and placed on patient arm. Side rails up x 2, fall assessment and education completed with patient/family. Patient/family able to verbalize understanding of risk associated with falls and verbalized understanding to call for assistance before getting out of bed. Call light within reach. Patient/family able to voice and demonstrate understanding of unit orientation instructions.

## 2019-04-30 LAB — GLUCOSE, CAPILLARY
Glucose-Capillary: 100 mg/dL — ABNORMAL HIGH (ref 70–99)
Glucose-Capillary: 116 mg/dL — ABNORMAL HIGH (ref 70–99)
Glucose-Capillary: 145 mg/dL — ABNORMAL HIGH (ref 70–99)
Glucose-Capillary: 159 mg/dL — ABNORMAL HIGH (ref 70–99)
Glucose-Capillary: 85 mg/dL (ref 70–99)
Glucose-Capillary: 96 mg/dL (ref 70–99)

## 2019-04-30 LAB — LEGIONELLA PNEUMOPHILA SEROGP 1 UR AG: L. pneumophila Serogp 1 Ur Ag: NEGATIVE

## 2019-04-30 MED ORDER — METOPROLOL TARTRATE 5 MG/5ML IV SOLN
5.0000 mg | Freq: Four times a day (QID) | INTRAVENOUS | Status: DC | PRN
  Administered 2019-05-01: 05:00:00 5 mg via INTRAVENOUS
  Filled 2019-04-30: qty 5

## 2019-04-30 MED ORDER — BOOST / RESOURCE BREEZE PO LIQD CUSTOM
1.0000 | Freq: Three times a day (TID) | ORAL | Status: DC
  Administered 2019-04-30 – 2019-05-02 (×5): 1 via ORAL

## 2019-04-30 NOTE — Progress Notes (Signed)
Foley removed at 630pm.  As per MD order for voiding trial and possible D/C in next few days.

## 2019-04-30 NOTE — TOC Initial Note (Signed)
Transition of Care [facility]) - Initial/Assessment Note    Patient Details  Name: Yolanda Turner MRN: [id number] Date of Birth: 01/21/1926  [facility] of [facility] CM/SW Contact:    Sharin Mons, RN Phone Number: 04/30/2019, 4:21 PM  Clinical Narrative:   Admitted with obuturator hernia. From home with family support 24/7. PTA active with [facility] , RN. No DME needs identified. Pt already has W/C, BSC, rolling walker, chair lift.            TOC team following for needs....  Expected Discharge Plan: [place] Barriers to Discharge: Continued Medical Work up   Patient Goals and CMS Choice   CMS Medicare.gov Compare Post Acute Care list provided to:: Patient Represenative (must comment) Choice offered to / list presented to : Patient  Expected Discharge Plan and Services Expected Discharge Plan: [place]   Discharge Planning Services: CM Consult   Living arrangements for the past 2 months: Single Family Home                 DME Arranged: (owns w/c, BSC, walker , cane, lift chair)         HH Arranged: RN, PT,OT [place] Agency: [place] ([place]) Date [place]: 04/30/19 Time [place]: [place] Representative spoke with at [place]: Butch Penny  Prior Living Arrangements/Services Living arrangements for the past 2 months: [place]   Patient language and need for interpreter reviewed:: Yes Do you feel safe going back to the place where you live?: Yes      Need for Family Participation in Patient Care: Yes (Comment) Care giver support system in place?: Yes (comment) Current home services: Home PT Criminal Activity/Legal Involvement Pertinent to Current Situation/Hospitalization: No - Comment as needed  Activities of Daily Living      Permission Sought/Granted Permission sought to share information with : Case Manager, Family Supports Permission granted to share information with : Yes, Verbal  Permission Granted              Emotional Assessment Appearance:: Appears stated age Attitude/Demeanor/Rapport: Engaged Affect (typically observed): Accepting Orientation: : Oriented to Self, Oriented to Place, Oriented to Situation Alcohol / Substance Use: Not Applicable Psych Involvement: No (comment)  Admission diagnosis:  obturator hernia Patient Active Problem List   Diagnosis Date Noted  . Pressure injury of skin 04/27/2019  . Obturator hernia 04/27/2019  . Small bowel obstruction ([place]) 04/27/2019  . Hypoxia 04/26/2019  . Respiratory failure, acute ([place]) 12/04/2018  . Hip fracture ([place]) 11/28/2018  . Goals of care, counseling/discussion   . Palliative care by specialist   . GERD (gastroesophageal reflux disease) 10/16/2018  . CAD (coronary artery disease) 10/16/2018  . Atrial fibrillation with RVR ([place]) 10/16/2018  . CVA (cerebral vascular accident) ([place]) 05/07/2018  . Hypertension 02/02/2018  . Diabetes mellitus without complication ([place]) [id number]  . Carotid artery stenosis 02/02/2018  . Acute CVA (cerebrovascular accident) ([place]) 04/04/2017  . Speech problem 04/02/2017  . Confusion 04/02/2017  . TIA (transient ischemic attack) 04/02/2017   PCP:  Albina Billet, MD Pharmacy:   [facility], [address] [place] [address] Phone: [phone] Fax: [phone]     Social Determinants of Health (SDOH) Interventions    Readmission Risk Interventions No flowsheet data found.

## 2019-04-30 NOTE — Progress Notes (Addendum)
Marland Kitchen  PROGRESS NOTE    Yolanda Turner  [id number] DOB: 07/02/1925 DOA: 04/27/2019 PCP: Albina Billet, MD   Brief Narrative:   Yolanda Turner a 85 y.o.femalewith medical history significant ofCAD status post MI,type 2 diabetes peripheral vascular disease GERD hypertension CVA,who presented to who presented to [facility] with complaints of left leg pain on 04/26/2019. Patient patient on further work-up was found to have a small bowel obstruction due to an incarcerated obturator hernia. Patient was also found to have pneumonia with acute episode of hypoxemia documented in ED. Patient was admitted to hospitalist service and treated for pneumonia and supportively treated for the small bowel obstruction and surgery was consulted. Due to multiple comorbidities and current acute medical problems and need for surgical intervention it was recommended that patient be transferred to [facility] for further care due to possible need for critical care bed status post surgery. Patient is now status post uneventful surgery where incarcerated hernia was reduced. Patient describes had no surgical complications. Her respiratory status also throughout surgery had remained stable. Patient is placed in PCU for further postop care and monitoring  11/17: No BM or flatus ON. She denies complaints.    Assessment & Plan:   Active Problems:   Obturator hernia   Small bowel obstruction (HCC)  Obturator hernia incarcerated with bowel obstruction - Status post surgical reduction - Postsurgical care per surgery - pain meds per surgery - started on CLD     - 11/17: No BM, flatus ON, denies N/V  Community-acquired pneumonia with hypoxemia - CXR noted - Bld Cx neg to date - Supportive care with supplemental O2 titrate off as able     - changed from lvq IV to doxy this AM  Hypertensive urgency - continue metoprolol; BP is acceptable.  History of chronic systolic heart  failure EF of around 25% - No acute exacerbation at this time - BNP 17; somewhat dry     - continue IVF in supplementation of PO feeding; UOP picking up, monitor.  Elevated troponin - likely due to demand ischemia - echo pending - trp stable  A fib RVR - ON went into a fib - started on dilt gtt; titrate and convert to PO; consider cards consult     - now on PO dilt; she is controlled; monitor     - spoke with grnddtr about risk v benefit of anticoagulation -- she is a significant fall risk, and her afib may have just been in response to surgery; will hold on anticoag; consider event monitor at d/c     - has a history of heart pauses per family; let's hold dilt and see how she responses now that he fluid status is better.  Left heel ulcer - WOCN consulted  Mild dementia - Per prior hospitalist note [name] daughter/granddaughter [name] have decision-making power on patient's behalf.  Normocytic anemia - down to 8.6 this AM, but no evidence of bleed; continue to monitor     - if she continues to trend down, hold heparin and switch to SCDs; then would check CT ab/pelvix  Hyperglycemia - A1c: 6.4; she is pre-diabetic; rec follow up w/ PCP; will continue SSI for now  DVT prophylaxis: heparin Code Status: FULL Family Communication: spoke with grnddtr by phone   Disposition Plan: TBD  Consultants:   General Surgery  Antimicrobials:  . doxycycline   ROS:  Denies N, V, ab pain, dyspnea, CP . Remainder 10-pt ROS is negative for all not previously  mentioned.  Subjective: "That'll be fine."  Objective: Vitals:   04/30/19 0218 04/30/19 0410 04/30/19 0835 04/30/19 1243  BP: (!) 127/48 121/60 (!) 137/52 137/63  Pulse: (!) 59 (!) 58 65 68  Resp: 17 19 20 18   Temp: 97.7 F (36.5 C) 98 F (36.7 C) 97.8 F (36.6 C) 98 F (36.7 C)  TempSrc: Oral Oral Oral Oral  SpO2: 99% 98% 99% 99%  Weight:        Intake/Output Summary  (Last 24 hours) at 04/30/2019 1445 Last data filed at 04/30/2019 1119 Gross per 24 hour  Intake 1232.67 ml  Output 275 ml  Net 957.67 ml   Filed Weights   04/28/19 0130  Weight: 33.4 kg    Examination:  General: 85 y.o. female resting in bed in NAD Cardiovascular: RRR, +S1, S2, no m/g/r, equal pulses throughout Respiratory: CTABL, no w/r/r, normal WOB GI: BS hypoactive, nondistended, nontender, LLQ TTP MSK: No e/c/c Neuro: Alert to name, follows commands Psyc: Appropriate interaction and affect, calm/cooperative   Data Reviewed: I have personally reviewed following labs and imaging studies.  CBC: Recent Labs  Lab 04/26/19 1905 04/27/19 0412 04/28/19 0426 04/29/19 0727  WBC 8.9 9.3 20.2* 10.4  NEUTROABS 7.7  --   --   --   HGB 11.5* 11.2* 10.3* 8.6*  HCT 36.6 33.5* 30.8* 26.8*  MCV 89.9 85.7 87.3 90.2  PLT 219 203 230 0000000   Basic Metabolic Panel: Recent Labs  Lab 04/26/19 1905 04/27/19 0412 04/28/19 0426 04/29/19 0727  NA 141 139 138 140  K 3.8 4.1 3.5 3.6  CL 104 102 100 107  CO2 25 27 26 26   GLUCOSE 188* 183* 192* 99  BUN 14 13 17 20   CREATININE 0.78 0.67 0.73 0.67  CALCIUM 9.5 9.3 9.5 9.1  MG  --   --   --  1.8  PHOS  --   --   --  2.5   GFR: Estimated Creatinine Clearance: 24.2 mL/min (by C-G formula based on SCr of 0.67 mg/dL). Liver Function Tests: Recent Labs  Lab 04/26/19 1905 04/29/19 0727  AST 21 17  ALT 13 12  ALKPHOS 61 33*  BILITOT 0.6 0.5  PROT 6.5 4.7*  ALBUMIN 3.8 2.5*   No results for input(s): LIPASE, AMYLASE in the last 168 hours. No results for input(s): AMMONIA in the last 168 hours. Coagulation Profile: No results for input(s): INR, PROTIME in the last 168 hours. Cardiac Enzymes: No results for input(s): CKTOTAL, CKMB, CKMBINDEX, TROPONINI in the last 168 hours. BNP (last 3 results) No results for input(s): PROBNP in the last 8760 hours. HbA1C: Recent Labs    04/29/19 0727  HGBA1C 6.4*   CBG: Recent Labs  Lab  04/29/19 2020 04/30/19 0013 04/30/19 0418 04/30/19 0837 04/30/19 1240  GLUCAP 96 100* 96 85 145*   Lipid Profile: No results for input(s): CHOL, HDL, LDLCALC, TRIG, CHOLHDL, LDLDIRECT in the last 72 hours. Thyroid Function Tests: No results for input(s): TSH, T4TOTAL, FREET4, T3FREE, THYROIDAB in the last 72 hours. Anemia Panel: No results for input(s): VITAMINB12, FOLATE, FERRITIN, TIBC, IRON, RETICCTPCT in the last 72 hours. Sepsis Labs: Recent Labs  Lab 04/26/19 1905 04/27/19 0412 04/27/19 1458  LATICACIDVEN 1.4 1.2 1.2    Recent Results (from the past 240 hour(s))  SARS CORONAVIRUS 2 (TAT 6-24 HRS) Nasopharyngeal Nasopharyngeal Swab     Status: None   Collection Time: 04/26/19  7:05 PM   Specimen: Nasopharyngeal Swab  Result Value Ref Range Status  SARS Coronavirus 2 NEGATIVE NEGATIVE Final    Comment: (NOTE) SARS-CoV-2 target nucleic acids are NOT DETECTED. The SARS-CoV-2 RNA is generally detectable in upper and lower respiratory specimens during the acute phase of infection. Negative results do not preclude SARS-CoV-2 infection, do not rule out co-infections with other pathogens, and should not be used as the sole basis for treatment or other patient management decisions. Negative results must be combined with clinical observations, patient history, and epidemiological information. The expected result is Negative. Fact Sheet for Patients: SugarRoll.be Fact Sheet for Healthcare Providers: https://www.woods-mathews.com/ This test is not yet approved or cleared by the [place] FDA and  has been authorized for detection and/or diagnosis of SARS-CoV-2 by FDA under an Emergency Use Authorization (EUA). This EUA will remain  in effect (meaning this test can be used) for the duration of the COVID-19 declaration under Section 56 4(b)(1) of the Act, 21 U.S.C. section 360bbb-3(b)(1), unless the authorization is terminated or  revoked sooner. Performed at [facility], [place] [address], [place], [place] [address]   Culture, blood (routine x 2) Call MD if unable to obtain prior to antibiotics being given     Status: None (Preliminary result)   Collection Time: 04/27/19  4:12 AM   Specimen: BLOOD  Result Value Ref Range Status   Specimen Description BLOOD RIGHT ANTECUBITAL  Final   Special Requests   Final    BOTTLES DRAWN AEROBIC AND ANAEROBIC Blood Culture results may not be optimal due to an excessive volume of blood received in culture bottles   Culture   Final    NO GROWTH 3 DAYS Performed at [facility], [address] [place], [place] [address]    Report Status PENDING  Incomplete  Culture, blood (routine x 2) Call MD if unable to obtain prior to antibiotics being given     Status: None (Preliminary result)   Collection Time: 04/27/19  4:12 AM   Specimen: BLOOD  Result Value Ref Range Status   Specimen Description BLOOD RIGHT HAND  Final   Special Requests   Final    BOTTLES DRAWN AEROBIC AND ANAEROBIC Blood Culture results may not be optimal due to an excessive volume of blood received in culture bottles   Culture   Final    NO GROWTH 3 DAYS Performed at [facility], [address], [place], [place] [address]    Report Status PENDING  Incomplete  MRSA PCR Screening     Status: None   Collection Time: 04/28/19  1:14 AM   Specimen: Nasal Mucosa; Nasopharyngeal  Result Value Ref Range Status   MRSA by PCR NEGATIVE NEGATIVE Final    Comment:        The GeneXpert MRSA Assay (FDA approved for NASAL specimens only), is one component of a comprehensive MRSA colonization surveillance program. It is not intended to diagnose MRSA infection nor to guide or monitor treatment for MRSA infections. Performed at [facility], [place] [address] [place],  [address]       Radiology Studies: No results found.   Scheduled Meds: . acetaminophen  650 mg Oral Q6H  .  chlorhexidine  15 mL Mouth Rinse BID  . Chlorhexidine Gluconate Cloth  6 each Topical Daily  . diltiazem  90 mg Oral Q12H  . docusate sodium  100 mg Oral BID  . doxycycline  100 mg Oral Q12H  . gabapentin  300 mg Oral BID  . heparin  5,000 Units Subcutaneous Q12H  .  insulin aspart  0-9 Units Subcutaneous Q4H  . metoprolol succinate  12.5 mg Oral QPM   Continuous Infusions: . sodium chloride 75 mL/hr at 04/30/19 1225     LOS: 3 days    Time spent: 25 minutes spent in the coordination of care today.    Jonnie Finner, DO [facility] Pager [phone]  If 7PM-7AM, please contact night-coverage [email or web address] Password [email or web address] 04/30/2019, 2:45 PM

## 2019-04-30 NOTE — Progress Notes (Signed)
[facility] Progress Note  3 Days Post-Op  Subjective: Patient denies abdominal pain. Unsure of whether she is passing flatus, thinks she may have had a BM. I do not see any stool recorded in chart. Patient denies nausea and is tolerated CLD.   Objective: Vital signs in last 24 hours: Temp:  [97.5 F (36.4 C)-98.8 F (37.1 C)] 97.8 F (36.6 C) (11/17 0835) Pulse Rate:  [58-75] 65 (11/17 0835) Resp:  [17-28] 20 (11/17 0835) BP: (121-149)/(46-60) 137/52 (11/17 0835) SpO2:  [91 %-100 %] 99 % (11/17 0835) Last BM Date: 04/29/19  Intake/Output from previous day: 11/16 0701 - 11/17 0700 In: 1474 [P.O.:200; I.V.:1274] Out: 375 [Urine:375] Intake/Output this shift: No intake/output data recorded.  PE: Gen:  Alert, NAD, pleasant Card:  Regular rate and rhythm Pulm:  Normal effort, clear to auscultation bilaterally Abd: Soft, non-tender, mildly distended, +BS, midline incision clean with honeycomb dressing intact  Skin: warm and dry, no rashes  Psych: A&Ox3   Lab Results:  Recent Labs    04/28/19 0426 04/29/19 0727  WBC 20.2* 10.4  HGB 10.3* 8.6*  HCT 30.8* 26.8*  PLT 230 152   BMET Recent Labs    04/28/19 0426 04/29/19 0727  NA 138 140  K 3.5 3.6  CL 100 107  CO2 26 26  GLUCOSE 192* 99  BUN 17 20  CREATININE 0.73 0.67  CALCIUM 9.5 9.1   PT/INR No results for input(s): LABPROT, INR in the last 72 hours. CMP     Component Value Date/Time   NA 140 04/29/2019 0727   NA 139 05/23/2013 1851   K 3.6 04/29/2019 0727   K 4.7 05/23/2013 1851   CL 107 04/29/2019 0727   CL 106 05/23/2013 1851   CO2 26 04/29/2019 0727   CO2 30 05/23/2013 1851   GLUCOSE 99 04/29/2019 0727   GLUCOSE 246 (H) 05/23/2013 1851   BUN 20 04/29/2019 0727   BUN 22 (H) 05/23/2013 1851   CREATININE 0.67 04/29/2019 0727   CREATININE 0.88 05/23/2013 1851   CALCIUM 9.1 04/29/2019 0727   CALCIUM 9.3 05/23/2013 1851   PROT 4.7 (L) 04/29/2019 0727   PROT 6.5 05/23/2013 1851   ALBUMIN 2.5 (L) 04/29/2019 0727   ALBUMIN 3.4 05/23/2013 1851   AST 17 04/29/2019 0727   AST 29 05/23/2013 1851   ALT 12 04/29/2019 0727   ALT 21 05/23/2013 1851   ALKPHOS 33 (L) 04/29/2019 0727   ALKPHOS 61 05/23/2013 1851   BILITOT 0.5 04/29/2019 0727   BILITOT 0.2 05/23/2013 1851   GFRNONAA >60 04/29/2019 0727   GFRNONAA 60 (L) 05/23/2013 1851   GFRAA >60 04/29/2019 0727   GFRAA >60 05/23/2013 1851   Lipase     Component Value Date/Time   LIPASE 31 05/02/2017 0305       Studies/Results: No results found.  Anti-infectives: Anti-infectives (From admission, onward)   Start     Dose/Rate Route Frequency Ordered Stop   04/30/19 2000  doxycycline (VIBRA-TABS) tablet 100 mg     100 mg Oral Every 12 hours 04/29/19 1710     04/28/19 0500  levofloxacin (LEVAQUIN) IVPB 750 mg  Status:  Discontinued     750 mg 100 mL/hr over 90 Minutes Intravenous Every 48 hours 04/28/19 0424 04/29/19 1710   04/27/19 2115  vancomycin (VANCOCIN) 500 mg in sodium chloride 0.9 % 100 mL IVPB     500 mg 100 mL/hr over 60 Minutes Intravenous To Surgery 04/27/19 2104 04/27/19 2342  Assessment/Plan CVA H/o CABG PVD MI GERD DM CAD  Obturator hernia - S/PExploratory laparotomy, reduction of incarcerated small bowel from obturator hernia, primary closure of peritoneum to obliterate the obturator hernia, Dr. Kae Heller, 11/14 - tolerating CLD, normoactive BS - advance to FLD - continue foley in setting of low urine output - continue to mobilize  FEN:FLD VTE: SCD's,SQ heparin [id number] 11/14>> Foley:conitnue Follow up:Dr. Kae Heller  DISPO:FLD, continue current management; repeat labs in AM  LOS: 3 days    Brigid Re , [facility] 04/30/2019, 9:23 AM Please see Amion for pager number during day hours 7:00am-4:30pm

## 2019-04-30 NOTE — Progress Notes (Signed)
Initial Nutrition Assessment  DOCUMENTATION CODES:   Severe malnutrition in context of chronic illness, Underweight  INTERVENTION:   -Boost Breeze po TID, each supplement provides 250 kcal and 9 grams of protein. -Magic cup TID with meals, each supplement provides 290 kcal and 9 grams of protein. -MVI with minerals.  Pt is at risk for refeeding.   NUTRITION DIAGNOSIS:   Severe Malnutrition related to chronic illness(dementia) as evidenced by severe muscle depletion, severe fat depletion, energy intake < or equal to 75% for > or equal to 1 month, percent weight loss.  GOAL:   Patient will meet greater than or equal to 90% of their needs  MONITOR:   PO intake, Supplement acceptance, Diet advancement, Labs, Weight trends  REASON FOR ASSESSMENT:   Other (Comment), Malnutrition Screening Tool(low BMI)    ASSESSMENT:   85 year old pt admitted with leg pain with PMH of dementia, CAD s/p MI, type 2 DM, peripheral vascular disease, GERD, HTN and CVA. Pt found to have a small bowel obstruction, pneumonia and left heel ulcer.  Pt was awake and sitting up in bed. Her daughter was at her bedside and answered questions. Daughter said pt or pts sister does the cooking at home. The pt mainly eats vegetables and fruit. Typically has a breakfast of buttermilk and a biscuit, greens and fruit for lunch and dinner. Drinks water and buttermilk. Pt was drinking strawberry Boost and Ensure after hip surgery but stopped due to constipation. Pt also cares for 78 year old son with cerebral palsy.   Daughter reports pt always having a small frame and being unsure of a usual wt. Believes pt weighed 80# before hip surgery on 11/29/18. Last charted wt from 12/05/18 is 36.5kg. This represents a 9% wt loss in 5 months which is severe.  Pt is at risk for refeeding.  Medications reviewed: cardizem sr 90mg , colace 100mg , doxycycline 100mg , heparin 5000 units, novolog, toprol-xl 12.5mg   Labs reviewed: glucose  85, Hgb A1C 6.4, Potassium 3.6, Phosphorus 2.5, Magnesium 1.8  NUTRITION - FOCUSED PHYSICAL EXAM:    Most Recent Value  Orbital Region  Severe depletion  Upper Arm Region  Severe depletion  Thoracic and Lumbar Region  Severe depletion  Buccal Region  Severe depletion  Temple Region  Moderate depletion  Clavicle Bone Region  Severe depletion  Clavicle and Acromion Bone Region  Severe depletion  Scapular Bone Region  Severe depletion  Dorsal Hand  Severe depletion  Patellar Region  Severe depletion  Anterior Thigh Region  Severe depletion  Posterior Calf Region  Unable to assess  Edema (RD Assessment)  Unable to assess  Hair  Reviewed  Eyes  Reviewed  Mouth  Reviewed  Skin  Reviewed  Nails  Reviewed       Diet Order:   Diet Order            Diet full liquid Room service appropriate? Yes; Fluid consistency: Thin  Diet effective now              EDUCATION NEEDS:   Education needs have been addressed  Skin:  Skin Assessment: Reviewed RN Assessment  Last BM:  11/16  Height:   Ht Readings from Last 1 Encounters:  04/27/19 4\' 9"  (1.448 m)    Weight:   Wt Readings from Last 1 Encounters:  04/28/19 33.4 kg    Ideal Body Weight:  42 kg  BMI:  Body mass index is 15.93 kg/m.  Estimated Nutritional Needs:   Kcal:  1200-1400  Protein:  60-70  Fluid:  1.2-1.4L    Allen Norris Dietetic Intern Pager # [phone]

## 2019-05-01 DIAGNOSIS — I480 Paroxysmal atrial fibrillation: Secondary | ICD-10-CM

## 2019-05-01 DIAGNOSIS — F0390 Unspecified dementia without behavioral disturbance: Secondary | ICD-10-CM

## 2019-05-01 DIAGNOSIS — K458 Other specified abdominal hernia without obstruction or gangrene: Secondary | ICD-10-CM

## 2019-05-01 DIAGNOSIS — K56609 Unspecified intestinal obstruction, unspecified as to partial versus complete obstruction: Secondary | ICD-10-CM

## 2019-05-01 LAB — MAGNESIUM: Magnesium: 1.6 mg/dL — ABNORMAL LOW (ref 1.7–2.4)

## 2019-05-01 LAB — CBC WITH DIFFERENTIAL/PLATELET
Abs Immature Granulocytes: 0.02 10*3/uL (ref 0.00–0.07)
Basophils Absolute: 0 10*3/uL (ref 0.0–0.1)
Basophils Relative: 1 %
Eosinophils Absolute: 0.1 10*3/uL (ref 0.0–0.5)
Eosinophils Relative: 1 %
HCT: 29 % — ABNORMAL LOW (ref 36.0–46.0)
Hemoglobin: 9.4 g/dL — ABNORMAL LOW (ref 12.0–15.0)
Immature Granulocytes: 0 %
Lymphocytes Relative: 8 %
Lymphs Abs: 0.6 10*3/uL — ABNORMAL LOW (ref 0.7–4.0)
MCH: 29.1 pg (ref 26.0–34.0)
MCHC: 32.4 g/dL (ref 30.0–36.0)
MCV: 89.8 fL (ref 80.0–100.0)
Monocytes Absolute: 0.5 10*3/uL (ref 0.1–1.0)
Monocytes Relative: 8 %
Neutro Abs: 5.6 10*3/uL (ref 1.7–7.7)
Neutrophils Relative %: 82 %
Platelets: 192 10*3/uL (ref 150–400)
RBC: 3.23 MIL/uL — ABNORMAL LOW (ref 3.87–5.11)
RDW: 14.3 % (ref 11.5–15.5)
WBC: 6.7 10*3/uL (ref 4.0–10.5)
nRBC: 0 % (ref 0.0–0.2)

## 2019-05-01 LAB — RENAL FUNCTION PANEL
Albumin: 2.1 g/dL — ABNORMAL LOW (ref 3.5–5.0)
Anion gap: 8 (ref 5–15)
BUN: 11 mg/dL (ref 8–23)
CO2: 23 mmol/L (ref 22–32)
Calcium: 8.3 mg/dL — ABNORMAL LOW (ref 8.9–10.3)
Chloride: 109 mmol/L (ref 98–111)
Creatinine, Ser: 0.48 mg/dL (ref 0.44–1.00)
GFR calc Af Amer: >60 mL/min (ref >60)
GFR calc non Af Amer: >60 mL/min (ref >60)
Glucose, Bld: 89 mg/dL (ref 70–99)
Phosphorus: 1.6 mg/dL — ABNORMAL LOW (ref 2.5–4.6)
Potassium: 3.3 mmol/L — ABNORMAL LOW (ref 3.5–5.1)
Sodium: 140 mmol/L (ref 135–145)

## 2019-05-01 LAB — GLUCOSE, CAPILLARY
Glucose-Capillary: 103 mg/dL — ABNORMAL HIGH (ref 70–99)
Glucose-Capillary: 113 mg/dL — ABNORMAL HIGH (ref 70–99)
Glucose-Capillary: 151 mg/dL — ABNORMAL HIGH (ref 70–99)
Glucose-Capillary: 169 mg/dL — ABNORMAL HIGH (ref 70–99)
Glucose-Capillary: 286 mg/dL — ABNORMAL HIGH (ref 70–99)
Glucose-Capillary: 89 mg/dL (ref 70–99)
Glucose-Capillary: 98 mg/dL (ref 70–99)

## 2019-05-01 MED ORDER — MAGNESIUM SULFATE 2 GM/50ML IV SOLN
2.0000 g | Freq: Once | INTRAVENOUS | Status: AC
  Administered 2019-05-01: 2 g via INTRAVENOUS
  Filled 2019-05-01: qty 50

## 2019-05-01 MED ORDER — FUROSEMIDE 10 MG/ML IJ SOLN
40.0000 mg | Freq: Once | INTRAMUSCULAR | Status: AC
  Administered 2019-05-01: 40 mg via INTRAVENOUS
  Filled 2019-05-01: qty 4

## 2019-05-01 MED ORDER — POTASSIUM CHLORIDE CRYS ER 20 MEQ PO TBCR
30.0000 meq | EXTENDED_RELEASE_TABLET | Freq: Once | ORAL | Status: AC
  Administered 2019-05-01: 30 meq via ORAL
  Filled 2019-05-01: qty 1

## 2019-05-01 MED ORDER — POLYETHYLENE GLYCOL 3350 17 G PO PACK
17.0000 g | PACK | Freq: Every day | ORAL | Status: DC
  Administered 2019-05-01 – 2019-05-02 (×2): 17 g via ORAL
  Filled 2019-05-01 (×2): qty 1

## 2019-05-01 NOTE — Progress Notes (Signed)
Patient HR in the 140-150,BP 107/67. No complaints of CP or SOB. Tylene Fantasia notified and paged. Metoprolol 5mg  given. HR down to low 100. BP 136/69. Patient receiving magnesium and potassium. Will continue to monitor.

## 2019-05-01 NOTE — Progress Notes (Signed)
Patient ID: Yolanda Turner, female   DOB: 02/11/1926, 85 y.o.   MRN: [id number]  PROGRESS NOTE    Yolanda Turner  [id number] DOB: 1926-01-19 DOA: 04/27/2019 PCP: Albina Billet, MD   Brief Narrative:  85 year old female with history of CAD status post MI, diabetes mellitus type 2, peripheral vascular disease, GERD, hypertension, unspecified CVA presented to [facility] with left leg pain on 04/26/2019.  Further work-up revealed that she had small bowel obstruction due to incarcerated obturator hernia.  She was also found to have pneumonia with acute episode of hypoxemia in the ED.  She was admitted under hospitalist service and treated for pneumonia; general surgery was consulted for small bowel obstruction.  She was transferred to [facility] for the further care and possible surgical intervention need.  She underwent surgery on 04/27/2019.  Assessment & Plan:   Small bowel obstruction secondary to incarcerated operative hernia -Status post surgery on 04/27/2019.  General surgery following.  Diet advancement as per general surgery. -Continue to mobilize.    Community-acquired pneumonia with hypoxia -Blood cultures negative so far. -Still requiring 2 L oxygen via nasal cannula. -Currently on oral doxycycline.  We will switch to Ceftin.  COVID-19 testing negative initially.  Hypertensive urgency -Blood pressure improving.  Continue metoprolol.  Paroxysmal A. fib with RVR -Has had intermittent episodes of RVR.  Patient initially started on IV Cardizem and switched to oral Cardizem.  After discussion with family, Cardizem was held due to history of?  Heart pauses.  Currently on metoprolol. -Will not start on anticoagulation because of history of significant fall risk.  Might need outpatient cardiology evaluation  History of chronic systolic heart failure with EF of around 25% -Echo showed EF of 60 to 65% with grade 1 diastolic dysfunction  Left foot ulcer: Present on admission -Follow  wound care as per wound care nurse recommendations  Dementia -Fall precautions.  Delirium precautions.  Generalized deconditioning -PT recommend home with PT  Normocytic anemia -Monitor.  DVT prophylaxis: Heparin Code Status: We will change CODE STATUS to DNR as per discussion with granddaughter/Carrie on phone Family Communication: Spoke to granddaughter/Carrie on phone on 05/01/2019 Disposition Plan: Home in 1 to 2 days once cleared by general surgery Consultants: General surgery  Procedures:  Echo IMPRESSIONS    1. Left ventricular ejection fraction, by visual estimation, is 60 to 65%. The left ventricle has normal function. Severely increased left ventricular posterior wall thickness. There is severely increased left ventricular hypertrophy of the basal  septum.  2. There is akinesis of the mid inferoseptal, apical septal, apical, apical inferior, mid anteroseptal and apical anterior walls.  3. Left ventricular diastolic parameters are consistent with Grade I diastolic dysfunction (impaired relaxation).  4. Elevated left ventricular end-diastolic pressure.  5. Global right ventricle has normal systolic function.The right ventricular size is normal. No increase in right ventricular wall thickness.  6. Left atrial size was normal.  7. Right atrial size was normal.  8. The mitral valve is normal in structure. Mild mitral valve regurgitation. No evidence of mitral stenosis.  9. The tricuspid valve is normal in structure. Tricuspid valve regurgitation is mild. 10. The aortic valve is tricuspid. Aortic valve regurgitation is mild. Mild to moderate aortic valve sclerosis/calcification without any evidence of aortic stenosis. 11. The pulmonic valve was normal in structure. Pulmonic valve regurgitation is not visualized. 12. The inferior vena cava is normal in size with <50% respiratory variability, suggesting right atrial pressure of 8 mmHg. 13. TR signal  is inadequate for assessing  pulmonary artery systolic pressure.  Exploratory laparotomy, reduction of incarcerated small bowel from obturator hernia, primary closure of peritoneum to obliterate the obturator hernia on 04/27/2019  Antimicrobials:  Anti-infectives (From admission, onward)   Start     Dose/Rate Route Frequency Ordered Stop   04/30/19 2000  doxycycline (VIBRA-TABS) tablet 100 mg     100 mg Oral Every 12 hours 04/29/19 1710     04/28/19 0500  levofloxacin (LEVAQUIN) IVPB 750 mg  Status:  Discontinued     750 mg 100 mL/hr over 90 Minutes Intravenous Every 48 hours 04/28/19 0424 04/29/19 1710   04/27/19 2115  vancomycin (VANCOCIN) 500 mg in sodium chloride 0.9 % 100 mL IVPB     500 mg 100 mL/hr over 60 Minutes Intravenous To Surgery 04/27/19 2104 04/27/19 2342       Subjective: Patient seen and examined at bedside.  She is awake but confused.  No overnight fever or vomiting noted by nursing staff.  Had episodes of A. fib with RVR early this morning.  Objective: Vitals:   05/01/19 0458 05/01/19 0619 05/01/19 0817 05/01/19 0842  BP: 104/75  (!) 123/95 137/61  Pulse: (!) 148  76 72  Resp: (!) 21 17 (!) 21 18  Temp:   (!) 97.4 F (36.3 C)   TempSrc:   Oral   SpO2: 98%  100% 100%  Weight:        Intake/Output Summary (Last 24 hours) at 05/01/2019 1143 Last data filed at 05/01/2019 0540 Gross per 24 hour  Intake 1411.91 ml  Output 1100 ml  Net 311.91 ml   Filed Weights   04/28/19 0130  Weight: 33.4 kg    Examination:  General exam: Appears calm and comfortable.  Elderly female lying in bed.  Awake but confused  respiratory system: Bilateral decreased breath sounds at bases with some scattered crackles Cardiovascular system: S1 & S2 heard, currently rate controlled Gastrointestinal system: Abdomen is nondistended, soft and mildly tender, midline incision present. Normal bowel sounds heard. Extremities: No cyanosis, clubbing, edema    Data Reviewed: I have personally reviewed following  labs and imaging studies  CBC: Recent Labs  Lab 04/26/19 1905 04/27/19 0412 04/28/19 0426 04/29/19 0727 05/01/19 0239  WBC 8.9 9.3 20.2* 10.4 6.7  NEUTROABS 7.7  --   --   --  5.6  HGB 11.5* 11.2* 10.3* 8.6* 9.4*  HCT 36.6 33.5* 30.8* 26.8* 29.0*  MCV 89.9 85.7 87.3 90.2 89.8  PLT 219 203 230 152 [id number]   Basic Metabolic Panel: Recent Labs  Lab 04/26/19 1905 04/27/19 0412 04/28/19 0426 04/29/19 0727 05/01/19 0239  NA 141 139 138 140 140  K 3.8 4.1 3.5 3.6 3.3*  CL 104 102 100 107 109  CO2 25 27 26 26 23   GLUCOSE 188* 183* 192* 99 89  BUN 14 13 17 20 11   CREATININE 0.78 0.67 0.73 0.67 0.48  CALCIUM 9.5 9.3 9.5 9.1 8.3*  MG  --   --   --  1.8 1.6*  PHOS  --   --   --  2.5 1.6*   GFR: Estimated Creatinine Clearance: 24.2 mL/min (by C-G formula based on SCr of 0.48 mg/dL). Liver Function Tests: Recent Labs  Lab 04/26/19 1905 04/29/19 0727 05/01/19 0239  AST 21 17  --   ALT 13 12  --   ALKPHOS 61 33*  --   BILITOT 0.6 0.5  --   PROT 6.5 4.7*  --  ALBUMIN 3.8 2.5* 2.1*   No results for input(s): LIPASE, AMYLASE in the last 168 hours. No results for input(s): AMMONIA in the last 168 hours. Coagulation Profile: No results for input(s): INR, PROTIME in the last 168 hours. Cardiac Enzymes: No results for input(s): CKTOTAL, CKMB, CKMBINDEX, TROPONINI in the last 168 hours. BNP (last 3 results) No results for input(s): PROBNP in the last 8760 hours. HbA1C: Recent Labs    04/29/19 0727  HGBA1C 6.4*   CBG: Recent Labs  Lab 04/30/19 1650 04/30/19 2040 05/01/19 0012 05/01/19 0434 05/01/19 0817  GLUCAP 159* 116* 169* 89 98   Lipid Profile: No results for input(s): CHOL, HDL, LDLCALC, TRIG, CHOLHDL, LDLDIRECT in the last 72 hours. Thyroid Function Tests: No results for input(s): TSH, T4TOTAL, FREET4, T3FREE, THYROIDAB in the last 72 hours. Anemia Panel: No results for input(s): VITAMINB12, FOLATE, FERRITIN, TIBC, IRON, RETICCTPCT in the last 72 hours.  Sepsis Labs: Recent Labs  Lab 04/26/19 1905 04/27/19 0412 04/27/19 1458  LATICACIDVEN 1.4 1.2 1.2    Recent Results (from the past 240 hour(s))  SARS CORONAVIRUS 2 (TAT 6-24 HRS) Nasopharyngeal Nasopharyngeal Swab     Status: None   Collection Time: 04/26/19  7:05 PM   Specimen: Nasopharyngeal Swab  Result Value Ref Range Status   SARS Coronavirus 2 NEGATIVE NEGATIVE Final    Comment: (NOTE) SARS-CoV-2 target nucleic acids are NOT DETECTED. The SARS-CoV-2 RNA is generally detectable in upper and lower respiratory specimens during the acute phase of infection. Negative results do not preclude SARS-CoV-2 infection, do not rule out co-infections with other pathogens, and should not be used as the sole basis for treatment or other patient management decisions. Negative results must be combined with clinical observations, patient history, and epidemiological information. The expected result is Negative. Fact Sheet for Patients: SugarRoll.be Fact Sheet for Healthcare Providers: https://www.woods-mathews.com/ This test is not yet approved or cleared by the [place] FDA and  has been authorized for detection and/or diagnosis of SARS-CoV-2 by FDA under an Emergency Use Authorization (EUA). This EUA will remain  in effect (meaning this test can be used) for the duration of the COVID-19 declaration under Section 56 4(b)(1) of the Act, 21 U.S.C. section 360bbb-3(b)(1), unless the authorization is terminated or revoked sooner. Performed at [facility] [place] [facility], [place] [address], [place], [place] [address]   Culture, blood (routine x 2) Call MD if unable to obtain prior to antibiotics being given     Status: None (Preliminary result)   Collection Time: 04/27/19  4:12 AM   Specimen: BLOOD  Result Value Ref Range Status   Specimen Description BLOOD RIGHT ANTECUBITAL  Final   Special Requests   Final    BOTTLES DRAWN AEROBIC AND ANAEROBIC Blood  Culture results may not be optimal due to an excessive volume of blood received in culture bottles   Culture   Final    NO GROWTH 4 DAYS Performed at [facility], [address], [place],  [address]    Report Status PENDING  Incomplete  Culture, blood (routine x 2) Call MD if unable to obtain prior to antibiotics being given     Status: None (Preliminary result)   Collection Time: 04/27/19  4:12 AM   Specimen: BLOOD  Result Value Ref Range Status   Specimen Description BLOOD RIGHT HAND  Final   Special Requests   Final    BOTTLES DRAWN AEROBIC AND ANAEROBIC Blood Culture results may not be optimal due to an excessive  volume of blood received in culture bottles   Culture   Final    NO GROWTH 4 DAYS Performed at [facility], [address] [place], [place] [address]    Report Status PENDING  Incomplete  MRSA PCR Screening     Status: None   Collection Time: 04/28/19  1:14 AM   Specimen: Nasal Mucosa; Nasopharyngeal  Result Value Ref Range Status   MRSA by PCR NEGATIVE NEGATIVE Final    Comment:        The GeneXpert MRSA Assay (FDA approved for NASAL specimens only), is one component of a comprehensive MRSA colonization surveillance program. It is not intended to diagnose MRSA infection nor to guide or monitor treatment for MRSA infections. Performed at [facility], [place] [address] [place], [place] [address]          Radiology Studies: No results found.      Scheduled Meds: . acetaminophen  650 mg Oral Q6H  . chlorhexidine  15 mL Mouth Rinse BID  . Chlorhexidine Gluconate Cloth  6 each Topical Daily  . docusate sodium  100 mg Oral BID  . doxycycline  100 mg Oral Q12H  . feeding supplement  1 Container Oral TID BM  . gabapentin  300 mg Oral BID  . heparin  5,000 Units Subcutaneous Q12H  . insulin aspart  0-9 Units Subcutaneous Q4H  . metoprolol succinate  12.5 mg Oral QPM  . polyethylene glycol  17 g Oral Daily   Continuous  Infusions: . sodium chloride 75 mL/hr at 05/01/19 0111          Joshua Soulier Starla Link, MD [facility] 05/01/2019, 11:43 AM

## 2019-05-01 NOTE — Progress Notes (Addendum)
[facility]/Trauma Progress Note  4 Days Post-Op   Assessment/Plan CVA H/o CABG PVD MI GERD DM CAD  Obturator hernia - S/PExploratory laparotomy, reduction of incarcerated small bowel from obturator hernia, primary closure of peritoneum to obliterate the obturator hernia, Dr. Kae Heller, 11/14 - tolerating FLD, normoactive BS and she endorses flatus - advance to soft diet - continue to mobilize  [id number] diet VTE: SCD's,SQ heparin [id number] 11/14>> Foley:none Follow up:Dr. Kae Heller  DISPO:soft diet, ambulate, SNF?   LOS: 4 days    Subjective: CC: mild abdominal pain  Pt denies nausea or vomiting. She is hungry. She endorses flatus. No BM.   Objective: Vital signs in last 24 hours: Temp:  [97.4 F (36.3 C)-98.1 F (36.7 C)] 97.4 F (36.3 C) (11/18 0817) Pulse Rate:  [63-148] 76 (11/18 0817) Resp:  [17-21] 21 (11/18 0817) BP: (104-151)/(52-95) 123/95 (11/18 0817) SpO2:  [97 %-100 %] 100 % (11/18 0817) Last BM Date: 04/29/19  Intake/Output from previous day: 11/17 0701 - 11/18 0700 In: 1651.9 [P.O.:360; I.V.:1241.9; IV Piggyback:50] Out: 1100 [Urine:1100] Intake/Output this shift: No intake/output data recorded.  PE:  Gen: Alert, NAD, pleasant Pulm:Rate andeffort normal Abd: Soft, ND, +BS, incisions C/D/I, very minimal TTP Skin: no rashes noted, warm and dry   Anti-infectives: Anti-infectives (From admission, onward)   Start     Dose/Rate Route Frequency Ordered Stop   04/30/19 2000  doxycycline (VIBRA-TABS) tablet 100 mg     100 mg Oral Every 12 hours 04/29/19 1710     04/28/19 0500  levofloxacin (LEVAQUIN) IVPB 750 mg  Status:  Discontinued     750 mg 100 mL/hr over 90 Minutes Intravenous Every 48 hours 04/28/19 0424 04/29/19 1710   04/27/19 2115  vancomycin (VANCOCIN) 500 mg in sodium chloride 0.9 % 100 mL IVPB     500 mg 100 mL/hr over 60 Minutes Intravenous To Surgery 04/27/19 2104 04/27/19 2342      Lab Results:   Recent Labs    04/29/19 0727 05/01/19 0239  WBC 10.4 6.7  HGB 8.6* 9.4*  HCT 26.8* 29.0*  PLT 152 192   BMET Recent Labs    04/29/19 0727 05/01/19 0239  NA 140 140  K 3.6 3.3*  CL 107 109  CO2 26 23  GLUCOSE 99 89  BUN 20 11  CREATININE 0.67 0.48  CALCIUM 9.1 8.3*   PT/INR No results for input(s): LABPROT, INR in the last 72 hours. CMP     Component Value Date/Time   NA 140 05/01/2019 0239   NA 139 05/23/2013 1851   K 3.3 (L) 05/01/2019 0239   K 4.7 05/23/2013 1851   CL 109 05/01/2019 0239   CL 106 05/23/2013 1851   CO2 23 05/01/2019 0239   CO2 30 05/23/2013 1851   GLUCOSE 89 05/01/2019 0239   GLUCOSE 246 (H) 05/23/2013 1851   BUN 11 05/01/2019 0239   BUN 22 (H) 05/23/2013 1851   CREATININE 0.48 05/01/2019 0239   CREATININE 0.88 05/23/2013 1851   CALCIUM 8.3 (L) 05/01/2019 0239   CALCIUM 9.3 05/23/2013 1851   PROT 4.7 (L) 04/29/2019 0727   PROT 6.5 05/23/2013 1851   ALBUMIN 2.1 (L) 05/01/2019 0239   ALBUMIN 3.4 05/23/2013 1851   AST 17 04/29/2019 0727   AST 29 05/23/2013 1851   ALT 12 04/29/2019 0727   ALT 21 05/23/2013 1851   ALKPHOS 33 (L) 04/29/2019 0727   ALKPHOS 61 05/23/2013 1851   BILITOT 0.5 04/29/2019 0727   BILITOT 0.2 05/23/2013  [place] >60 05/01/2019 0239   GFRNONAA 60 (L) 05/23/2013 1851   GFRAA >60 05/01/2019 0239   GFRAA >60 05/23/2013 1851   Lipase     Component Value Date/Time   LIPASE 31 05/02/2017 0305    Studies/Results: No results found.   Kalman Drape, PA-C [facility] Please see amion for pager for the following: Myna Hidalgo, W, & Friday 7:00am - 4:30pm Thursdays 7:00am -11:30am

## 2019-05-01 NOTE — Care Management Important Message (Signed)
Important Message  Patient Details  Name: Yolanda Turner MRN: [id number] Date of Birth: June 23, 1925   Medicare Important Message Given:  Yes     Cormac Wint Montine Circle 05/01/2019, 11:46 AM

## 2019-05-01 NOTE — Plan of Care (Signed)

## 2019-05-01 NOTE — Progress Notes (Signed)
Occupational Therapy Treatment Patient Details Name: RICQUEL DUNGAN MRN: [id number] DOB: Oct 26, 1925 Today's Date: 05/01/2019    History of present illness Pt is a 85 y/o female with PMH of CAD post MI, type 2 DM, PVD, HTN, CVA, presented to [facility] with L Leg pain on 04/26/19. Further work up revealed SBO due to incarcerated obturator hernia, and pneumonia. S/p reduction of obturator hernia 11/14.    OT comments  Pt progressing toward established goals. Pt currently requires modA for toileting transfer and stability while standing for pt to perform pericare. Pt requires physical assistance to navigate around obstacles, bumped into objects on the R side x2. Pt's cognitive and physical limitations impacting safety and independence with ADL/IADL and functional mobility. Pt will continue to benefit from skilled OT services to maximize safety and independence with ADL/IADL and functional mobility. Will continue to follow acutely and progress as tolerated.      Follow Up Recommendations  Home health OT;Supervision/Assistance - 24 hour    Equipment Recommendations  3 in 1 bedside commode    Recommendations for Other Services      Precautions / Restrictions Precautions Precautions: Fall Restrictions Weight Bearing Restrictions: No       Mobility Bed Mobility Overal bed mobility: Needs Assistance Bed Mobility: Rolling;Sidelying to Sit Rolling: Mod assist Sidelying to sit: Mod assist       General bed mobility comments: Mod assistance to roll and advance LEs to edge of bed.  Mod assistance to elevate trunk into sitting.  Transfers Overall transfer level: Needs assistance Equipment used: Rolling walker (2 wheeled) Transfers: Sit to/from Stand Sit to Stand: +2 safety/equipment;Mod assist Stand pivot transfers: Max assist;+2 safety/equipment       General transfer comment: Pt with posterior translation.  required cues for weight shifting forward and for hand placement to and from  seated surface.    Balance Overall balance assessment: Needs assistance Sitting-balance support: Feet supported;No upper extremity supported Sitting balance-Leahy Scale: Poor Sitting balance - Comments: R lateral lean with unsupported sitting  Postural control: Right lateral lean Standing balance support: Bilateral upper extremity supported Standing balance-Leahy Scale: Poor Standing balance comment: relaint on external support                           ADL either performed or assessed with clinical judgement   ADL Overall ADL's : Needs assistance/impaired                         Toilet Transfer: Moderate assistance;+2 for physical assistance Toilet Transfer Details (indicate cue type and reason): stand pivot from EOB to [place] and Hygiene: Moderate assistance;Sit to/from stand;+2 for safety/equipment Toileting - Clothing Manipulation Details (indicate cue type and reason): modA to maintain balance in standing, pt performed pericare      Functional mobility during ADLs: Rolling walker;Minimal assistance;Moderate assistance General ADL Comments: minA for stability during mobility, modA at times for safety and safe use of DME, pt running into objects on R side     Vision   Additional Comments: further assess, pt bumping into objects on R side   Perception     Praxis      Cognition Arousal/Alertness: Awake/alert Behavior During Therapy: Flat affect Overall Cognitive Status: Impaired/Different from baseline Area of Impairment: Following commands;Safety/judgement;Problem solving;Awareness;Attention                   Current Attention Level: Sustained  Following Commands: Follows one step commands with increased time;Follows one step commands inconsistently Safety/Judgement: Decreased awareness of safety Awareness: Emergent Problem Solving: Slow processing;Decreased initiation;Difficulty sequencing;Requires verbal  cues;Requires tactile cues General Comments: decreased safety awareness;difficulty following 3step commands;urinated while on commode, reports she didn't do anything        Exercises     Shoulder Instructions       General Comments Pt on 2lnc upon arrival SpO2 99%, RA for bed mobility, SpO2 fluctuated 92-97 with pursed lip breathing    Pertinent Vitals/ Pain       Pain Assessment: Faces Faces Pain Scale: Hurts little more Pain Location: L LE movement  Pain Descriptors / Indicators: Discomfort;Grimacing;Moaning Pain Intervention(s): Limited activity within patient's tolerance;Monitored during session  Home Living                                          Prior Functioning/Environment              Frequency  Min 3X/week        Progress Toward Goals  OT Goals(current goals can now be found in the care plan section)  Progress towards OT goals: Progressing toward goals  Acute Rehab OT Goals Patient Stated Goal: to get back home  OT Goal Formulation: With family Time For Goal Achievement: 05/12/19 Potential to Achieve Goals: Fair ADL Goals Pt Will Perform Grooming: with min assist;sitting Pt Will Perform Upper Body Bathing: with supervision;sitting Pt Will Perform Lower Body Dressing: sit to/from stand;with mod assist Pt Will Transfer to Toilet: with mod assist;stand pivot transfer;bedside commode Pt Will Perform Toileting - Clothing Manipulation and hygiene: sit to/from stand;with mod assist Pt/caregiver will Perform Home Exercise Program: Increased strength;Both right and left upper extremity;With written HEP provided  Plan Discharge plan remains appropriate    Co-evaluation    PT/OT/SLP Co-Evaluation/Treatment: Yes Reason for Co-Treatment: Complexity of the patient's impairments (multi-system involvement);For patient/therapist safety;To address functional/ADL transfers PT goals addressed during session: Mobility/safety with mobility OT goals  addressed during session: ADL's and self-care      AM-PAC OT "6 Clicks" Daily Activity     Outcome Measure   Help from another person eating meals?: A Little Help from another person taking care of personal grooming?: A Lot Help from another person toileting, which includes using toliet, bedpan, or urinal?: A Lot Help from another person bathing (including washing, rinsing, drying)?: A Lot Help from another person to put on and taking off regular upper body clothing?: A Lot Help from another person to put on and taking off regular lower body clothing?: A Lot 6 Click Score: 13    End of Session Equipment Utilized During Treatment: Oxygen  OT Visit Diagnosis: Other abnormalities of gait and mobility (R26.89);Muscle weakness (generalized) (M62.81);Pain;Cognitive communication deficit (R41.841) Symptoms and signs involving cognitive functions: Cerebral infarction   Activity Tolerance Patient tolerated treatment well   Patient Left in chair;with call bell/phone within reach;with family/visitor present   Nurse Communication Mobility status        Time: [id number] OT Time Calculation (min): 23 min  Charges: OT General Charges $OT Visit: 1 Visit OT Treatments $Self Care/Home Management : 8-22 mins  Dorinda Hill OTR/L [place] Office: [place] 05/01/2019, 1:03 PM

## 2019-05-01 NOTE — Progress Notes (Signed)
Patient had change in heart rhythm, ekg showed SR with PAC, left bundle branch block. Baltazar Najjar, on-call paged and notified. Patient alert, denies Chest pain and sob. Will continue to monitor.

## 2019-05-01 NOTE — Progress Notes (Signed)
Physical Therapy Treatment Patient Details Name: Yolanda Turner MRN: [id number] DOB: 1926-05-08 Today's Date: 05/01/2019    History of Present Illness Pt is a 85 y/o female with PMH of CAD post MI, type 2 DM, PVD, HTN, CVA, presented to [facility] with L Leg pain on 04/26/19. Further work up revealed SBO due to incarcerated obturator hernia, and pneumonia. S/p reduction of obturator hernia 11/14.     PT Comments    Pt performed gt training and functional mobility with max VCs.  She is very soft spoken and somewhat HOH.  She performed transfer to commode and short distance of ambulation in halls.  Pt required x1 standing break and x1 seated break during gt trial to recover from decreased SPO2.  She ultimately required 4L [place] with activity.  Pt continues to benefit from HHPT and 24 hr assistance.  Plan next session for continued progression of functional mobility.     Follow Up Recommendations  Home health PT;Supervision/Assistance - 24 hour     Equipment Recommendations  None recommended by PT    Recommendations for Other Services       Precautions / Restrictions Precautions Precautions: Fall Restrictions Weight Bearing Restrictions: No    Mobility  Bed Mobility Overal bed mobility: Needs Assistance Bed Mobility: Rolling;Sidelying to Sit Rolling: Mod assist Sidelying to sit: Mod assist       General bed mobility comments: Mod assistance to roll and advance LEs to edge of bed.  Mod assistance to elevate trunk into sitting.  Transfers Overall transfer level: Needs assistance Equipment used: Rolling walker (2 wheeled) Transfers: Sit to/from Stand Sit to Stand: +2 safety/equipment;Mod assist         General transfer comment: Pt with posterior translation.  required cues for weight shifting forward and for hand placement to and from seated surface.  Ambulation/Gait Ambulation/Gait assistance: Mod assist;+2 physical assistance Gait Distance (Feet): 4 Feet(x2 from bed <>  commode.  + 50 ft trial with x1 standing rest break After 25 ft and seated break at 35 ft.  Close chair follow for safety.) Assistive device: Rolling walker (2 wheeled) Gait Pattern/deviations: Step-through pattern;Decreased step length - right;Decreased step length - left;Narrow base of support;Shuffle;Antalgic;Decreased weight shift to left;Staggering right;Drifts right/left Gait velocity: slowed   General Gait Details: Mod +2 for safety.Pt noted to veer to the R side.  Pt required cues for upper trunk control and pursed lip breathing.  SPO2 dropped to 78% but poor wavform noted, she required 4L [place] to maintain O2 sats with ambulation.   Stairs             Wheelchair Mobility    Modified Rankin (Stroke Patients Only)       Balance Overall balance assessment: Needs assistance Sitting-balance support: Feet supported;No upper extremity supported Sitting balance-Leahy Scale: Poor Sitting balance - Comments: R lateral lean with unsupported sitting      Standing balance-Leahy Scale: Poor                              Cognition Arousal/Alertness: Awake/alert Behavior During Therapy: Flat affect Overall Cognitive Status: Difficult to assess                                 General Comments: I'm not sure if she fully understands what we are asking her and replies alot with "yes"      Exercises  General Comments        Pertinent Vitals/Pain Pain Assessment: Faces Faces Pain Scale: Hurts little more Pain Location: L LE movement  Pain Descriptors / Indicators: Discomfort;Grimacing;Moaning Pain Intervention(s): Monitored during session;Repositioned    Home Living                      Prior Function            PT Goals (current goals can now be found in the care plan section) Acute Rehab PT Goals Patient Stated Goal: to get back home  Potential to Achieve Goals: Fair Progress towards PT goals: Progressing toward goals     Frequency    Min 3X/week      PT Plan Current plan remains appropriate    Co-evaluation PT/OT/SLP Co-Evaluation/Treatment: Yes Reason for Co-Treatment: Complexity of the patient's impairments (multi-system involvement) PT goals addressed during session: Mobility/safety with mobility OT goals addressed during session: ADL's and self-care      AM-PAC PT "6 Clicks" Mobility   Outcome Measure  Help needed turning from your back to your side while in a flat bed without using bedrails?: A Lot Help needed moving from lying on your back to sitting on the side of a flat bed without using bedrails?: A Lot Help needed moving to and from a bed to a chair (including a wheelchair)?: A Lot Help needed standing up from a chair using your arms (e.g., wheelchair or bedside chair)?: A Lot Help needed to walk in hospital room?: A Lot Help needed climbing 3-5 steps with a railing? : Total 6 Click Score: 11    End of Session Equipment Utilized During Treatment: Gait belt;Oxygen Activity Tolerance: Patient limited by fatigue;Patient limited by pain Patient left: in chair;with call bell/phone within reach;with family/visitor present Nurse Communication: Mobility status PT Visit Diagnosis: Muscle weakness (generalized) (M62.81);Difficulty in walking, not elsewhere classified (R26.2)     Time: [id number] PT Time Calculation (min) (ACUTE ONLY): 23 min  Charges:  $Gait Training: 8-22 mins                     Erasmo Leventhal , PTA Acute Rehabilitation Services Pager [phone] Office [phone] ]    Cristela Blue 05/01/2019, 12:03 PM

## 2019-05-02 DIAGNOSIS — J189 Pneumonia, unspecified organism: Secondary | ICD-10-CM

## 2019-05-02 LAB — CBC WITH DIFFERENTIAL/PLATELET
Abs Immature Granulocytes: 0.03 10*3/uL (ref 0.00–0.07)
Basophils Absolute: 0 10*3/uL (ref 0.0–0.1)
Basophils Relative: 1 %
Eosinophils Absolute: 0.1 10*3/uL (ref 0.0–0.5)
Eosinophils Relative: 3 %
HCT: 30.6 % — ABNORMAL LOW (ref 36.0–46.0)
Hemoglobin: 10 g/dL — ABNORMAL LOW (ref 12.0–15.0)
Immature Granulocytes: 1 %
Lymphocytes Relative: 17 %
Lymphs Abs: 0.8 10*3/uL (ref 0.7–4.0)
MCH: 29 pg (ref 26.0–34.0)
MCHC: 32.7 g/dL (ref 30.0–36.0)
MCV: 88.7 fL (ref 80.0–100.0)
Monocytes Absolute: 0.4 10*3/uL (ref 0.1–1.0)
Monocytes Relative: 9 %
Neutro Abs: 3.5 10*3/uL (ref 1.7–7.7)
Neutrophils Relative %: 69 %
Platelets: 222 10*3/uL (ref 150–400)
RBC: 3.45 MIL/uL — ABNORMAL LOW (ref 3.87–5.11)
RDW: 14.5 % (ref 11.5–15.5)
WBC: 4.9 10*3/uL (ref 4.0–10.5)
nRBC: 0 % (ref 0.0–0.2)

## 2019-05-02 LAB — CULTURE, BLOOD (ROUTINE X 2)
Culture: NO GROWTH
Culture: NO GROWTH

## 2019-05-02 LAB — BASIC METABOLIC PANEL
Anion gap: 6 (ref 5–15)
BUN: 10 mg/dL (ref 8–23)
CO2: 27 mmol/L (ref 22–32)
Calcium: 8.4 mg/dL — ABNORMAL LOW (ref 8.9–10.3)
Chloride: 107 mmol/L (ref 98–111)
Creatinine, Ser: 0.61 mg/dL (ref 0.44–1.00)
GFR calc Af Amer: >60 mL/min (ref >60)
GFR calc non Af Amer: >60 mL/min (ref >60)
Glucose, Bld: 101 mg/dL — ABNORMAL HIGH (ref 70–99)
Potassium: 3.7 mmol/L (ref 3.5–5.1)
Sodium: 140 mmol/L (ref 135–145)

## 2019-05-02 LAB — GLUCOSE, CAPILLARY
Glucose-Capillary: 115 mg/dL — ABNORMAL HIGH (ref 70–99)
Glucose-Capillary: 95 mg/dL (ref 70–99)

## 2019-05-02 LAB — MAGNESIUM: Magnesium: 1.8 mg/dL (ref 1.7–2.4)

## 2019-05-02 MED ORDER — DOCUSATE SODIUM 100 MG PO CAPS: 100.0000 mg | ORAL_CAPSULE | Freq: Two times a day (BID) | ORAL | 0 refills | Status: AC

## 2019-05-02 MED ORDER — BISACODYL 10 MG RE SUPP: 10.0000 mg | Freq: Every day | RECTAL | 0 refills | Status: AC | PRN

## 2019-05-02 MED ORDER — ONDANSETRON 4 MG PO TBDP: 4.0000 mg | ORAL_TABLET | Freq: Four times a day (QID) | ORAL | 0 refills | Status: AC | PRN

## 2019-05-02 MED ORDER — POLYETHYLENE GLYCOL 3350 17 G PO PACK: 17.0000 g | PACK | Freq: Every day | ORAL | 0 refills | Status: AC

## 2019-05-02 MED ORDER — TRAMADOL HCL 50 MG PO TABS: 25.0000 mg | ORAL_TABLET | Freq: Four times a day (QID) | ORAL | 0 refills | Status: AC | PRN

## 2019-05-02 MED ORDER — DOXYCYCLINE HYCLATE 100 MG PO TABS: 100.0000 mg | ORAL_TABLET | Freq: Two times a day (BID) | ORAL | 0 refills | Status: AC

## 2019-05-02 NOTE — Plan of Care (Signed)

## 2019-05-02 NOTE — Progress Notes (Signed)
Yolanda Turner discharged Home per MD order.  Discharge instructions reviewed and discussed with the patient's daughter, Yolanda Turner, all questions and concerns answered. Copy of instructions and care notes for new medications.  Allergies as of 05/02/2019      Reactions   Penicillins Rash   Has patient had a PCN reaction causing immediate rash, facial/tongue/throat swelling, SOB or lightheadedness with hypotension: No Has patient had a PCN reaction causing severe rash involving mucus membranes or skin necrosis: No Has patient had a PCN reaction that required hospitalization: No Has patient had a PCN reaction occurring within the last 10 years: No If all of the above answers are "NO", then may proceed with Cephalosporin use.      Medication List    TAKE these medications   bisacodyl 10 MG suppository Commonly known as: DULCOLAX Place 1 suppository (10 mg total) rectally daily as needed for moderate constipation.   clopidogrel 75 MG tablet Commonly known as: PLAVIX Take 75 mg by mouth daily.   docusate sodium 100 MG capsule Commonly known as: COLACE Take 1 capsule (100 mg total) by mouth 2 (two) times daily.   doxycycline 100 MG tablet Commonly known as: VIBRA-TABS Take 1 tablet (100 mg total) by mouth every 12 (twelve) hours for 3 days.   feeding supplement (ENSURE ENLIVE) Liqd Take 237 mLs by mouth 2 (two) times daily between meals.   furosemide 20 MG tablet Commonly known as: LASIX Take 20 mg by mouth daily.   gabapentin 300 MG capsule Commonly known as: NEURONTIN Take 600 mg by mouth at bedtime.   lisinopril 5 MG tablet Commonly known as: ZESTRIL Take 5 mg by mouth daily.   metoprolol succinate 25 MG 24 hr tablet Commonly known as: TOPROL-XL Take 0.5 tablets (12.5 mg total) by mouth daily. What changed: when to take this   ondansetron 4 MG disintegrating tablet Commonly known as: ZOFRAN-ODT Take 1 tablet (4 mg total) by mouth every 6 (six) hours as needed for  nausea.   polyethylene glycol 17 g packet Commonly known as: MIRALAX / GLYCOLAX Take 17 g by mouth daily. Start taking on: May 03, 2019   simvastatin 40 MG tablet Commonly known as: ZOCOR Take 40 mg by mouth daily.   traMADol 50 MG tablet Commonly known as: ULTRAM Take 0.5-1 tablets (25-50 mg total) by mouth every 6 (six) hours as needed for moderate pain or severe pain.        IV site discontinued and catheter remains intact. Site without signs and symptoms of complications. Dressing and pressure applied.  Patient escorted to car by RN in a wheelchair,  no distress noted upon discharge.  Yolanda Turner, Yolanda Turner 05/02/2019 11:27 AM

## 2019-05-02 NOTE — Progress Notes (Signed)
[facility] Progress Note  5 Days Post-Op  Subjective: CC: sore Patient reports abdomen is a little sore. Denies n/v. Tolerating soft diet. +flatus and BM.   Objective: Vital signs in last 24 hours: Temp:  [97.4 F (36.3 C)-98.8 F (37.1 C)] 97.9 F (36.6 C) (11/19 0413) Pulse Rate:  [72-92] 79 (11/19 0453) Resp:  [17-22] 17 (11/19 0453) BP: (122-151)/(61-95) 126/61 (11/19 0413) SpO2:  [92 %-100 %] 93 % (11/19 0453) Weight:  [33.2 kg] 33.2 kg (11/19 0453) Last BM Date: 04/29/19  Intake/Output from previous day: 11/18 0701 - 11/19 0700 In: 240 [P.O.:240] Out: 750 [Urine:750] Intake/Output this shift: No intake/output data recorded.  PE: Gen: Alert, NAD, pleasant Pulm:Rate andeffort normal Abd: Soft, ND, +BS, incisions C/D/I, very minimal TTP at incision Skin: no rashes noted, warm and dry  Lab Results:  Recent Labs    05/01/19 0239 05/02/19 0312  WBC 6.7 4.9  HGB 9.4* 10.0*  HCT 29.0* 30.6*  PLT 192 222   BMET Recent Labs    05/01/19 0239 05/02/19 0312  NA 140 140  K 3.3* 3.7  CL 109 107  CO2 23 27  GLUCOSE 89 101*  BUN 11 10  CREATININE 0.48 0.61  CALCIUM 8.3* 8.4*   PT/INR No results for input(s): LABPROT, INR in the last 72 hours. CMP     Component Value Date/Time   NA 140 05/02/2019 0312   NA 139 05/23/2013 1851   K 3.7 05/02/2019 0312   K 4.7 05/23/2013 1851   CL 107 05/02/2019 0312   CL 106 05/23/2013 1851   CO2 27 05/02/2019 0312   CO2 30 05/23/2013 1851   GLUCOSE 101 (H) 05/02/2019 0312   GLUCOSE 246 (H) 05/23/2013 1851   BUN 10 05/02/2019 0312   BUN 22 (H) 05/23/2013 1851   CREATININE 0.61 05/02/2019 0312   CREATININE 0.88 05/23/2013 1851   CALCIUM 8.4 (L) 05/02/2019 0312   CALCIUM 9.3 05/23/2013 1851   PROT 4.7 (L) 04/29/2019 0727   PROT 6.5 05/23/2013 1851   ALBUMIN 2.1 (L) 05/01/2019 0239   ALBUMIN 3.4 05/23/2013 1851   AST 17 04/29/2019 0727   AST 29 05/23/2013 1851   ALT 12 04/29/2019 0727   ALT 21  05/23/2013 1851   ALKPHOS 33 (L) 04/29/2019 0727   ALKPHOS 61 05/23/2013 1851   BILITOT 0.5 04/29/2019 0727   BILITOT 0.2 05/23/2013 1851   GFRNONAA >60 05/02/2019 0312   GFRNONAA 60 (L) 05/23/2013 1851   GFRAA >60 05/02/2019 0312   GFRAA >60 05/23/2013 1851   Lipase     Component Value Date/Time   LIPASE 31 05/02/2017 0305       Studies/Results: No results found.  Anti-infectives: Anti-infectives (From admission, onward)   Start     Dose/Rate Route Frequency Ordered Stop   04/30/19 2000  doxycycline (VIBRA-TABS) tablet 100 mg     100 mg Oral Every 12 hours 04/29/19 1710     04/28/19 0500  levofloxacin (LEVAQUIN) IVPB 750 mg  Status:  Discontinued     750 mg 100 mL/hr over 90 Minutes Intravenous Every 48 hours 04/28/19 0424 04/29/19 1710   04/27/19 2115  vancomycin (VANCOCIN) 500 mg in sodium chloride 0.9 % 100 mL IVPB     500 mg 100 mL/hr over 60 Minutes Intravenous To Surgery 04/27/19 2104 04/27/19 2342       Assessment/Plan CVA H/o CABG PVD MI GERD DM CAD ?dementia  Obturator hernia - S/PExploratory laparotomy, reduction of incarcerated small bowel  from obturator hernia, primary closure of peritoneum to obliterate the obturator hernia, Dr. Kae Heller, 11/14 - tolerating soft diet, normoactive BS, +flatus and BM - continue to mobilize  [id number] diet VTE: SCD's,SQheparin [id number] 11/14>11/16; PO doxy 11/17>> Foley:none Follow up:Dr. Kae Heller  DISPO:stable for discharge from a surgical perspective. Follow up in chart  LOS: 5 days    Brigid Re , [facility] 05/02/2019, 8:14 AM Please see Amion for pager number during day hours 7:00am-4:30pm

## 2019-05-02 NOTE — Discharge Summary (Addendum)
Physician Discharge Summary  Yolanda Turner [id number] DOB: [birth year] DOA: 04/27/2019  PCP: Albina Billet, MD  Admit date: 04/27/2019 Discharge date: 05/02/2019  Admitted From: Home Disposition: Home  Recommendations for Outpatient Follow-up:  1. Follow up with PCP in 1 week with repeat CBC/BMP 2. Outpatient follow-up with general surgery.  Diet and wound care as per general surgery 3. Outpatient evaluation and follow-up with palliative care for goals of care discussion. 4. Follow up in ED if symptoms worsen or new appear   Home Health: PT/OT Equipment/Devices: None  Discharge Condition: Stable CODE STATUS: DNR Diet recommendation: Heart healthy  Brief/Interim Summary: 85 year old female with history of CAD status post MI, diabetes mellitus type 2, peripheral vascular disease, GERD, hypertension, unspecified CVA presented to [facility] with left leg pain on 04/26/2019.  Further work-up revealed that she had small bowel obstruction due to incarcerated obturator hernia.  She was also found to have pneumonia with acute episode of hypoxemia in the ED.  She was admitted under hospitalist service and treated for pneumonia; general surgery was consulted for small bowel obstruction.  She was transferred to [facility] for the further care and possible surgical intervention need.  She underwent surgery on 04/27/2019.  Postoperatively, diet has been advanced as per general surgery.  General surgery has cleared the patient for discharge today.  She has been switched to oral doxycycline for her pneumonia and hypoxia.  Currently on room air.  She will be discharged on 3 more days of oral doxycycline.   Discharge Diagnoses:   Small bowel obstruction secondary to incarcerated operative hernia -Status post surgery on 04/27/2019.  General surgery following.  Diet advancement as per general surgery. -Patient apparently had bowel movement yesterday.  General surgery has cleared the patient for  discharge.  Discharge home today with outpatient follow-up with general surgery.  Wound care and diet as per general surgery recommendations.  Continue Colace and MiraLAX as per general surgery.  Community-acquired pneumonia with hypoxia -Blood cultures negative so far. -Required supplemental oxygen. -Currently on oral doxycycline.  COVID-19 testing negative initially. -Currently on home meds.  Will discharge on doxycycline for 2 more days.  Hypertensive urgency -Blood pressure improving.  Continue home regimen.  Paroxysmal A. fib with RVR -Has had intermittent episodes of RVR.  Patient initially started on IV Cardizem and switched to oral Cardizem.  After discussion with family, Cardizem was held due to history of?  Heart pauses.  Currently on metoprolol. -Will not start on anticoagulation because of history of significant fall risk.  Might need outpatient cardiology evaluation -Heart rate improved.  Continue metoprolol.  Outpatient follow-up with cardiology  History of chronic systolic heart failure with EF of around 25% -Echo showed EF of 60 to 65% with grade 1 diastolic dysfunction -Continue metoprolol and lisinopril  Left foot ulcer: Present on admission -Follow wound care as per wound care nurse recommendations  Dementia -Fall precautions.  Delirium precautions.  Generalized deconditioning -PT recommend home with PT  Normocytic anemia -Stable.  Outpatient follow-up   Discharge Instructions  Discharge Instructions    Diet - low sodium heart healthy   Complete by: As directed    Increase activity slowly   Complete by: As directed      Allergies as of 05/02/2019      Reactions   Penicillins Rash   Has patient had a PCN reaction causing immediate rash, facial/tongue/throat swelling, SOB or lightheadedness with hypotension: No Has patient had a PCN reaction causing severe rash involving  mucus membranes or skin necrosis: No Has patient had a PCN reaction that  required hospitalization: No Has patient had a PCN reaction occurring within the last 10 years: No If all of the above answers are "NO", then may proceed with Cephalosporin use.      Medication List    TAKE these medications   bisacodyl 10 MG suppository Commonly known as: DULCOLAX Place 1 suppository (10 mg total) rectally daily as needed for moderate constipation.   clopidogrel 75 MG tablet Commonly known as: PLAVIX Take 75 mg by mouth daily.   docusate sodium 100 MG capsule Commonly known as: COLACE Take 1 capsule (100 mg total) by mouth 2 (two) times daily.   doxycycline 100 MG tablet Commonly known as: VIBRA-TABS Take 1 tablet (100 mg total) by mouth every 12 (twelve) hours for 3 days.   feeding supplement (ENSURE ENLIVE) Liqd Take 237 mLs by mouth 2 (two) times daily between meals.   furosemide 20 MG tablet Commonly known as: LASIX Take 20 mg by mouth daily.   gabapentin 300 MG capsule Commonly known as: NEURONTIN Take 600 mg by mouth at bedtime.   lisinopril 5 MG tablet Commonly known as: ZESTRIL Take 5 mg by mouth daily.   metoprolol succinate 25 MG 24 hr tablet Commonly known as: TOPROL-XL Take 0.5 tablets (12.5 mg total) by mouth daily. What changed: when to take this   ondansetron 4 MG disintegrating tablet Commonly known as: ZOFRAN-ODT Take 1 tablet (4 mg total) by mouth every 6 (six) hours as needed for nausea.   polyethylene glycol 17 g packet Commonly known as: MIRALAX / GLYCOLAX Take 17 g by mouth daily. Start taking on: May 03, 2019   simvastatin 40 MG tablet Commonly known as: ZOCOR Take 40 mg by mouth daily.      Follow-up Information    Surgery, [place]. Go on 05/06/2019.   Specialty: General Surgery Why: Appointment for staple removal scheduled for 10:00 AM. Please arrive 30 min prior to appointment time for check-in. Bring photo ID and insurance information with you.  Contact information: [place] [address] [phone]        Clovis Riley, MD. Go on 05/22/2019.   Specialty: General Surgery Why: Post-op follow up with surgeon scheduled for 10:20 AM. Please arrive 15 min prior to appointment time. Bring photo ID and insurance information with you.  Contact information: [address] [place] [address] [phone]        Albina Billet, MD. Schedule an appointment as soon as possible for a visit in 1 week(s).   Specialty: Internal Medicine Why: Please repeat CBC/BMP Contact information: [address]   [place] [address] [phone]          Allergies  Allergen Reactions  . Penicillins Rash    Has patient had a PCN reaction causing immediate rash, facial/tongue/throat swelling, SOB or lightheadedness with hypotension: No Has patient had a PCN reaction causing severe rash involving mucus membranes or skin necrosis: No Has patient had a PCN reaction that required hospitalization: No Has patient had a PCN reaction occurring within the last 10 years: No If all of the above answers are "NO", then may proceed with Cephalosporin use.    Consultations:  General surgery   Procedures/Studies: Dg Chest 1 View  Result Date: 04/26/2019 CLINICAL DATA:  Left hip pain and leg pain. EXAM: CHEST  1 VIEW COMPARISON:  Chest x-ray 12/04/2018  FINDINGS: Heart size remains enlarged following median sternotomy with signs of aortic atherosclerosis. Increased interstitial markings bilaterally with more pronounced opacity in the left chest, left upper lobe. No signs of pleural effusion. Lungs are less hyperinflated than before. No acute bone finding. IMPRESSION: Findings that are suspicious for atypical pneumonia versus asymmetric pulmonary edema. Cardiomegaly and changes of sternotomy as before. COPD Electronically Signed   By: Zetta Bills M.D.   On: 04/26/2019 19:34   Ct Chest W Contrast  Result Date: 04/26/2019 CLINICAL DATA:   Interstitial lung disease. New infiltrates on chest x-ray. Abdominal distension with concern for bowel obstruction. EXAM: CT CHEST, ABDOMEN, AND PELVIS WITH CONTRAST TECHNIQUE: Multidetector CT imaging of the chest, abdomen and pelvis was performed following the standard protocol during bolus administration of intravenous contrast. CONTRAST:  80mL OMNIPAQUE IOHEXOL 300 MG/ML  SOLN COMPARISON:  CT chest dated December 04, 2018. CT of the pelvis dated 05/24/2013 FINDINGS: CT CHEST FINDINGS Cardiovascular: There is no evidence for large centrally located pulmonary embolism. Detection of smaller segmental and subsegmental pulmonary emboli is limited by technique and contrast bolus timing. Advanced aortic calcifications are noted. The main pulmonary artery is dilated. The main right and left pulmonary arteries are dilated. The heart is not significantly enlarged. There is no pericardial effusion. Mediastinum/Nodes: --No mediastinal or hilar lymphadenopathy. --No axillary lymphadenopathy. --No supraclavicular lymphadenopathy. --Normal thyroid gland. --The esophagus is unremarkable Lungs/Pleura: There are multifocal areas of consolidation, greatest within the left upper and bilateral lower lobes. There is scarring versus atelectasis at the lung bases. There is no pneumothorax. There are trace bilateral pleural effusions. Musculoskeletal: No chest wall abnormality. No acute or significant osseous findings. CT ABDOMEN PELVIS FINDINGS Hepatobiliary: The liver is normal. Normal gallbladder.There is no biliary ductal dilation. Pancreas: Normal contours without ductal dilatation. No peripancreatic fluid collection. Spleen: No splenic laceration or hematoma. Adrenals/Urinary Tract: --Adrenal glands: No adrenal hemorrhage. --Right kidney/ureter: No hydronephrosis or perinephric hematoma. --Left kidney/ureter: No hydronephrosis or perinephric hematoma. --Urinary bladder: The urinary bladder is moderately distended. Stomach/Bowel:  --Stomach/Duodenum: The stomach is distended with an air-fluid level. --Small bowel: There are dilated loops of small bowel in the low midline abdomen and pelvis measuring up to approximately 3.8 cm in diameter. This appears to be secondary to an obturator hernia through the left obturator foramen. --Colon: Rectosigmoid diverticulosis without acute inflammation. --Appendix: Not visualized. No right lower quadrant inflammation or free fluid. Vascular/Lymphatic: Atherosclerotic calcification is present within the non-aneurysmal abdominal aorta, without hemodynamically significant stenosis. --No retroperitoneal lymphadenopathy. --No mesenteric lymphadenopathy. --No pelvic or inguinal lymphadenopathy. Reproductive: Status post hysterectomy. No adnexal mass. Other: There is a small amount of free fluid in the patient's abdomen and pelvis. There is no significant free air. Musculoskeletal. No acute displaced fractures. IMPRESSION: 1. Small-bowel obstruction likely secondary to an obturator hernia through the left obturator foramen. 2. Multifocal pneumonia (viral or bacterial), greatest within the left upper lobe. 3. Trace bilateral pleural effusions. 4. Moderately distended urinary bladder. 5. Sigmoid diverticulosis without CT evidence for diverticulitis. 6. Aortic atherosclerosis. Aortic Atherosclerosis (ICD10-I70.0). Electronically Signed   By: Constance Holster M.D.   On: 04/26/2019 23:34   Ct Abdomen Pelvis W Contrast  Result Date: 04/26/2019 CLINICAL DATA:  Interstitial lung disease. New infiltrates on chest x-ray. Abdominal distension with concern for bowel obstruction. EXAM: CT CHEST, ABDOMEN, AND PELVIS WITH CONTRAST TECHNIQUE: Multidetector CT imaging of the chest, abdomen and pelvis was performed following the standard protocol during bolus administration of intravenous contrast. CONTRAST:  67mL OMNIPAQUE IOHEXOL  300 MG/ML  SOLN COMPARISON:  CT chest dated December 04, 2018. CT of the pelvis dated 05/24/2013  FINDINGS: CT CHEST FINDINGS Cardiovascular: There is no evidence for large centrally located pulmonary embolism. Detection of smaller segmental and subsegmental pulmonary emboli is limited by technique and contrast bolus timing. Advanced aortic calcifications are noted. The main pulmonary artery is dilated. The main right and left pulmonary arteries are dilated. The heart is not significantly enlarged. There is no pericardial effusion. Mediastinum/Nodes: --No mediastinal or hilar lymphadenopathy. --No axillary lymphadenopathy. --No supraclavicular lymphadenopathy. --Normal thyroid gland. --The esophagus is unremarkable Lungs/Pleura: There are multifocal areas of consolidation, greatest within the left upper and bilateral lower lobes. There is scarring versus atelectasis at the lung bases. There is no pneumothorax. There are trace bilateral pleural effusions. Musculoskeletal: No chest wall abnormality. No acute or significant osseous findings. CT ABDOMEN PELVIS FINDINGS Hepatobiliary: The liver is normal. Normal gallbladder.There is no biliary ductal dilation. Pancreas: Normal contours without ductal dilatation. No peripancreatic fluid collection. Spleen: No splenic laceration or hematoma. Adrenals/Urinary Tract: --Adrenal glands: No adrenal hemorrhage. --Right kidney/ureter: No hydronephrosis or perinephric hematoma. --Left kidney/ureter: No hydronephrosis or perinephric hematoma. --Urinary bladder: The urinary bladder is moderately distended. Stomach/Bowel: --Stomach/Duodenum: The stomach is distended with an air-fluid level. --Small bowel: There are dilated loops of small bowel in the low midline abdomen and pelvis measuring up to approximately 3.8 cm in diameter. This appears to be secondary to an obturator hernia through the left obturator foramen. --Colon: Rectosigmoid diverticulosis without acute inflammation. --Appendix: Not visualized. No right lower quadrant inflammation or free fluid. Vascular/Lymphatic:  Atherosclerotic calcification is present within the non-aneurysmal abdominal aorta, without hemodynamically significant stenosis. --No retroperitoneal lymphadenopathy. --No mesenteric lymphadenopathy. --No pelvic or inguinal lymphadenopathy. Reproductive: Status post hysterectomy. No adnexal mass. Other: There is a small amount of free fluid in the patient's abdomen and pelvis. There is no significant free air. Musculoskeletal. No acute displaced fractures. IMPRESSION: 1. Small-bowel obstruction likely secondary to an obturator hernia through the left obturator foramen. 2. Multifocal pneumonia (viral or bacterial), greatest within the left upper lobe. 3. Trace bilateral pleural effusions. 4. Moderately distended urinary bladder. 5. Sigmoid diverticulosis without CT evidence for diverticulitis. 6. Aortic atherosclerosis. Aortic Atherosclerosis (ICD10-I70.0). Electronically Signed   By: Constance Holster M.D.   On: 04/26/2019 23:34   US Venous Img Lower Unilateral Left (dvt)  Result Date: 04/27/2019 CLINICAL DATA:  Left lower extremity pain EXAM: LEFT LOWER EXTREMITY VENOUS DOPPLER ULTRASOUND TECHNIQUE: Gray-scale sonography with graded compression, as well as color Doppler and duplex ultrasound were performed to evaluate the lower extremity deep venous systems from the level of the common femoral vein and including the common femoral, femoral, profunda femoral, popliteal and calf veins including the posterior tibial, peroneal and gastrocnemius veins when visible. The superficial great saphenous vein was also interrogated. Spectral Doppler was utilized to evaluate flow at rest and with distal augmentation maneuvers in the common femoral, femoral and popliteal veins. COMPARISON:  None. FINDINGS: Contralateral Common Femoral Vein: Not assessed. Common Femoral Vein: No evidence of thrombus. Normal compressibility, respiratory phasicity and response to augmentation. Saphenofemoral Junction: No evidence of thrombus.  Normal compressibility and flow on color Doppler imaging. Profunda Femoral Vein: No evidence of thrombus. Normal compressibility and flow on color Doppler imaging. Femoral Vein: No evidence of thrombus. Normal compressibility, respiratory phasicity and response to augmentation. Popliteal Vein: No evidence of thrombus. Normal compressibility, respiratory phasicity and response to augmentation. Calf Veins: No evidence of thrombus. Normal compressibility and flow  on color Doppler imaging. Superficial Great Saphenous Vein: No evidence of thrombus. Normal compressibility. Venous Reflux:  None. Other Findings:  None. IMPRESSION: No evidence of deep venous thrombosis. Electronically Signed   By: Rolm Baptise M.D.   On: 04/27/2019 02:52   US Arterial Lower Extremity Duplex Left (non-abi)  Result Date: 04/27/2019 CLINICAL DATA:  Left lower extremity pain, peripheral vascular disease, hypertension, diabetes and coronary artery disease. EXAM: LEFT LOWER EXTREMITY ARTERIAL DUPLEX SCAN TECHNIQUE: Gray-scale sonography as well as color Doppler and duplex ultrasound was performed to evaluate the lower extremity arteries including the common, superficial and profunda femoral arteries, popliteal artery and calf arteries. COMPARISON:  None. FINDINGS: Left lower Extremity Inflow: The common femoral artery demonstrates moderate eccentric calcified plaque without visible significant luminal stenosis. Arterial waveform is triphasic. Outflow: Normal velocities. SFA waveform is biphasic. Profunda femoral waveform is triphasic. Calcified plaque is seen throughout the SFA. Popliteal artery is patent with biphasic waveform. Mild calcified plaque is present in the popliteal artery. Runoff: Limited tibial artery evaluation with anterior and posterior tibial arteries demonstrating normal velocities, biphasic waveforms and scattered plaque. posterior and anterior tibial arterial waveforms and velocities. Vessels are patent to the ankle.  IMPRESSION: Diffuse atherosclerosis of left lower extremity arteries without focal occlusion or abnormal velocities detected by duplex ultrasound. Electronically Signed   By: Aletta Edouard M.D.   On: 04/27/2019 09:03   Dg Abd Portable 2 Views  Result Date: 04/26/2019 CLINICAL DATA:  Dilated bowel for further workup EXAM: PORTABLE ABDOMEN - 2 VIEW COMPARISON:  Hip radiographs 04/26/2019 FINDINGS: Again we demonstrate a dilated loop of small bowel in the right lower quadrant, measuring about 4.1 cm in diameter. There is formed stool in the colon. The upright abdomen radiograph excludes the pelvis, the dilated loop is seen but month certain if there are associated air-fluid levels because the pelvis is excluded. Aortic and splenic artery atherosclerosis. Small vessel stent in the abdomen, possibly of the left renal artery. Left hip hemiarthroplasty. Bony demineralization. The upright view includes some of the chest and demonstrates indistinct left perihilar airspace opacity as well as bilateral interstitial opacity in the lungs. Prior CABG noted. IMPRESSION: 1. Abnormally dilated loop of small bowel in the right lower quadrant, similar to prior appearance the. The appearance is abnormal but nonspecific and could represent local ileus or early obstruction. However, there is formed stool in the colon. 2. Airspace opacity in the left perihilar region with bilateral interstitial accentuation, possibilities include asymmetric edema or pneumonia. 3. Mild enlargement of the cardiopericardial silhouette 4. Atherosclerosis. 5. Bony demineralization. Electronically Signed   By: Van Clines M.D.   On: 04/26/2019 20:13   Dg Hip Unilat W Or Wo Pelvis 2-3 Views Left  Result Date: 04/26/2019 CLINICAL DATA:  Left hip and leg pain EXAM: DG HIP (WITH OR WITHOUT PELVIS) 2-3V LEFT COMPARISON:  Femoral exam of the same date, postoperative exam of 11/29/2018 FINDINGS: Signs of left hip arthroplasty. No signs of  periprosthetic fracture. Osteopenia with minimal degenerative changes in the right hip. No signs of pelvic fracture with evidence of calcified atherosclerotic changes overlying the pelvis. Focal bowel dilation in the right lower quadrant, small bowel loops up to 4 cm are incidentally noted. There is evidence of some stool and gas in the rectum. IMPRESSION: 1. Left hip arthroplasty without signs of acute fracture or dislocation. 2. Focal bowel dilation in the right lower quadrant, small bowel loops up to 4 cm are incidentally noted, consider dedicated abdominal plain film  to exclude ileus versus early small bowel obstruction. 3. Signs of calcified atherosclerotic changes in the pelvis. Electronically Signed   By: Zetta Bills M.D.   On: 04/26/2019 19:37   Dg Femur Min 2 Views Left  Result Date: 04/26/2019 CLINICAL DATA:  Leg pain, left leg. EXAM: LEFT FEMUR 2 VIEWS COMPARISON:  11/29/2018 FINDINGS: Post left hip arthroplasty without signs of periprosthetic fracture. Signs of vascular calcification along superficial femoral artery course. Osteopenia. IMPRESSION: Post left hip arthroplasty. No acute abnormality. Electronically Signed   By: Zetta Bills M.D.   On: 04/26/2019 19:30       Subjective: Patient seen and examined at bedside.  She is a poor historian.  States that she had a small bowel movement yesterday.  No overnight fever or vomiting.  Discharge Exam: Vitals:   05/02/19 0453 05/02/19 0822  BP:  (!) 149/64  Pulse: 79 78  Resp: 17 20  Temp:  98.1 F (36.7 C)  SpO2: 93% 96%    General: Pt is awake, not in acute distress.  Elderly female.  Poor historian. Cardiovascular: rate controlled, S1/S2 + Respiratory: bilateral decreased breath sounds at bases Abdominal: Soft, NT, ND, bowel sounds + Extremities: no edema, no cyanosis    The results of significant diagnostics from this hospitalization (including imaging, microbiology, ancillary and laboratory) are listed below for  reference.     Microbiology: Recent Results (from the past 240 hour(s))  SARS CORONAVIRUS 2 (TAT 6-24 HRS) Nasopharyngeal Nasopharyngeal Swab     Status: None   Collection Time: 04/26/19  7:05 PM   Specimen: Nasopharyngeal Swab  Result Value Ref Range Status   SARS Coronavirus 2 NEGATIVE NEGATIVE Final    Comment: (NOTE) SARS-CoV-2 target nucleic acids are NOT DETECTED. The SARS-CoV-2 RNA is generally detectable in upper and lower respiratory specimens during the acute phase of infection. Negative results do not preclude SARS-CoV-2 infection, do not rule out co-infections with other pathogens, and should not be used as the sole basis for treatment or other patient management decisions. Negative results must be combined with clinical observations, patient history, and epidemiological information. The expected result is Negative. Fact Sheet for Patients: SugarRoll.be Fact Sheet for Healthcare Providers: https://www.woods-mathews.com/ This test is not yet approved or cleared by the [place] FDA and  has been authorized for detection and/or diagnosis of SARS-CoV-2 by FDA under an Emergency Use Authorization (EUA). This EUA will remain  in effect (meaning this test can be used) for the duration of the COVID-19 declaration under Section 56 4(b)(1) of the Act, 21 U.S.C. section 360bbb-3(b)(1), unless the authorization is terminated or revoked sooner. Performed at [facility], [place] [address], [place], [place] [address]   Culture, blood (routine x 2) Call MD if unable to obtain prior to antibiotics being given     Status: None   Collection Time: 04/27/19  4:12 AM   Specimen: BLOOD  Result Value Ref Range Status   Specimen Description BLOOD RIGHT ANTECUBITAL  Final   Special Requests   Final    BOTTLES DRAWN AEROBIC AND ANAEROBIC Blood Culture results may not be optimal due to an excessive volume of blood received in culture bottles    Culture   Final    NO GROWTH 5 DAYS Performed at [facility], [address], [place], [place] [address]    Report Status 05/02/2019 FINAL  Final  Culture, blood (routine x 2) Call MD if unable to obtain prior to antibiotics being given  Status: None   Collection Time: 04/27/19  4:12 AM   Specimen: BLOOD  Result Value Ref Range Status   Specimen Description BLOOD RIGHT HAND  Final   Special Requests   Final    BOTTLES DRAWN AEROBIC AND ANAEROBIC Blood Culture results may not be optimal due to an excessive volume of blood received in culture bottles   Culture   Final    NO GROWTH 5 DAYS Performed at [facility], [address] [place], [place] [address]    Report Status 05/02/2019 FINAL  Final  MRSA PCR Screening     Status: None   Collection Time: 04/28/19  1:14 AM   Specimen: Nasal Mucosa; Nasopharyngeal  Result Value Ref Range Status   MRSA by PCR NEGATIVE NEGATIVE Final    Comment:        The GeneXpert MRSA Assay (FDA approved for NASAL specimens only), is one component of a comprehensive MRSA colonization surveillance program. It is not intended to diagnose MRSA infection nor to guide or monitor treatment for MRSA infections. Performed at [facility], [place] [address] [place], [place] [address]      Labs: BNP (last 3 results) Recent Labs    12/04/18 1359 04/26/19 1905  BNP 642.0* [id number]   Basic Metabolic Panel: Recent Labs  Lab 04/27/19 0412 04/28/19 0426 04/29/19 0727 05/01/19 0239 05/02/19 0312  NA 139 138 140 140 140  K 4.1 3.5 3.6 3.3* 3.7  CL 102 100 107 109 107  CO2 27 26 26 23 27   GLUCOSE 183* 192* 99 89 101*  BUN 13 17 20 11 10   CREATININE 0.67 0.73 0.67 0.48 0.61  CALCIUM 9.3 9.5 9.1 8.3* 8.4*  MG  --   --  1.8 1.6* 1.8  PHOS  --   --  2.5 1.6*  --    Liver Function Tests: Recent Labs  Lab 04/26/19 1905 04/29/19 0727 05/01/19 0239  AST 21 17  --   ALT 13 12  --   ALKPHOS 61 33*  --   BILITOT 0.6 0.5   --   PROT 6.5 4.7*  --   ALBUMIN 3.8 2.5* 2.1*   No results for input(s): LIPASE, AMYLASE in the last 168 hours. No results for input(s): AMMONIA in the last 168 hours. CBC: Recent Labs  Lab 04/26/19 1905 04/27/19 0412 04/28/19 0426 04/29/19 0727 05/01/19 0239 05/02/19 0312  WBC 8.9 9.3 20.2* 10.4 6.7 4.9  NEUTROABS 7.7  --   --   --  5.6 3.5  HGB 11.5* 11.2* 10.3* 8.6* 9.4* 10.0*  HCT 36.6 33.5* 30.8* 26.8* 29.0* 30.6*  MCV 89.9 85.7 87.3 90.2 89.8 88.7  PLT 219 203 230 152 192 222   Cardiac Enzymes: No results for input(s): CKTOTAL, CKMB, CKMBINDEX, TROPONINI in the last 168 hours. BNP: Invalid input(s): POCBNP CBG: Recent Labs  Lab 05/01/19 1603 05/01/19 2014 05/01/19 2348 05/02/19 0444 05/02/19 0821  GLUCAP 286* 103* 113* 115* 95   D-Dimer No results for input(s): DDIMER in the last 72 hours. Hgb A1c No results for input(s): HGBA1C in the last 72 hours. Lipid Profile No results for input(s): CHOL, HDL, LDLCALC, TRIG, CHOLHDL, LDLDIRECT in the last 72 hours. Thyroid function studies No results for input(s): TSH, T4TOTAL, T3FREE, THYROIDAB in the last 72 hours.  Invalid input(s): FREET3 Anemia work up No results for input(s): VITAMINB12, FOLATE, FERRITIN, TIBC, IRON, RETICCTPCT in the last 72 hours. Urinalysis    Component Value Date/Time   COLORURINE  YELLOW (A) 10/16/2018 2027   APPEARANCEUR CLEAR (A) 10/16/2018 2027   LABSPEC 1.019 10/16/2018 2027   PHURINE 5.0 10/16/2018 2027   GLUCOSEU NEGATIVE 10/16/2018 2027   HGBUR NEGATIVE 10/16/2018 2027   BILIRUBINUR NEGATIVE 10/16/2018 2027   KETONESUR 5 (A) 10/16/2018 2027   PROTEINUR NEGATIVE 10/16/2018 2027   NITRITE NEGATIVE 10/16/2018 2027   LEUKOCYTESUR NEGATIVE 10/16/2018 2027   Sepsis Labs Invalid input(s): PROCALCITONIN,  WBC,  LACTICIDVEN Microbiology Recent Results (from the past 240 hour(s))  SARS CORONAVIRUS 2 (TAT 6-24 HRS) Nasopharyngeal Nasopharyngeal Swab     Status: None   Collection  Time: 04/26/19  7:05 PM   Specimen: Nasopharyngeal Swab  Result Value Ref Range Status   SARS Coronavirus 2 NEGATIVE NEGATIVE Final    Comment: (NOTE) SARS-CoV-2 target nucleic acids are NOT DETECTED. The SARS-CoV-2 RNA is generally detectable in upper and lower respiratory specimens during the acute phase of infection. Negative results do not preclude SARS-CoV-2 infection, do not rule out co-infections with other pathogens, and should not be used as the sole basis for treatment or other patient management decisions. Negative results must be combined with clinical observations, patient history, and epidemiological information. The expected result is Negative. Fact Sheet for Patients: SugarRoll.be Fact Sheet for Healthcare Providers: https://www.woods-mathews.com/ This test is not yet approved or cleared by the [place] FDA and  has been authorized for detection and/or diagnosis of SARS-CoV-2 by FDA under an Emergency Use Authorization (EUA). This EUA will remain  in effect (meaning this test can be used) for the duration of the COVID-19 declaration under Section 56 4(b)(1) of the Act, 21 U.S.C. section 360bbb-3(b)(1), unless the authorization is terminated or revoked sooner. Performed at [facility], [place] [address] [place], [place] [address]   Culture, blood (routine x 2) Call MD if unable to obtain prior to antibiotics being given     Status: None   Collection Time: 04/27/19  4:12 AM   Specimen: BLOOD  Result Value Ref Range Status   Specimen Description BLOOD RIGHT ANTECUBITAL  Final   Special Requests   Final    BOTTLES DRAWN AEROBIC AND ANAEROBIC Blood Culture results may not be optimal due to an excessive volume of blood received in culture bottles   Culture   Final    NO GROWTH 5 DAYS Performed at [facility], [address] [place], [place] [address]    Report Status 05/02/2019 FINAL  Final  Culture,  blood (routine x 2) Call MD if unable to obtain prior to antibiotics being given     Status: None   Collection Time: 04/27/19  4:12 AM   Specimen: BLOOD  Result Value Ref Range Status   Specimen Description BLOOD RIGHT HAND  Final   Special Requests   Final    BOTTLES DRAWN AEROBIC AND ANAEROBIC Blood Culture results may not be optimal due to an excessive volume of blood received in culture bottles   Culture   Final    NO GROWTH 5 DAYS Performed at [facility], [address], [place], [place] [address]    Report Status 05/02/2019 FINAL  Final  MRSA PCR Screening     Status: None   Collection Time: 04/28/19  1:14 AM   Specimen: Nasal Mucosa; Nasopharyngeal  Result Value Ref Range Status   MRSA by PCR NEGATIVE NEGATIVE Final    Comment:        The GeneXpert MRSA Assay (FDA approved for NASAL specimens only), is one  component of a comprehensive MRSA colonization surveillance program. It is not intended to diagnose MRSA infection nor to guide or monitor treatment for MRSA infections. Performed at [facility], [place] [address], [place], [place] [address]      Time coordinating discharge: 35 minutes  SIGNED:   Aline August, MD  [facility] 05/02/2019, 9:32 AM

## 2019-05-02 NOTE — TOC Transition Note (Addendum)
Transition of Care [facility]) - CM/SW Discharge Note   Patient Details  Name: Yolanda Turner MRN: [id number] Date of Birth: 07/14/1925  [facility] CM/SW Contact:  Benard Halsted, LCSW Phone Number: 05/02/2019, 10:31 AM   Clinical Narrative:    CSW notified [place] of patient's discharge today. CSW spoke with patient's granddaughter, Yolanda Turner, regarding palliative referral. Yolanda Turner reports that the family will think about it and will either contact CSW back or patient's PCP for referral. No other needs noted at this time.    Final next level of care: [place] Barriers to Discharge: No Barriers Identified   Patient Goals and CMS Choice Patient states their goals for this hospitalization and ongoing recovery are:: Return home CMS Medicare.gov Compare Post Acute Care list provided to:: Patient Represenative (must comment) Choice offered to / list presented to : Patient  Discharge Placement                       Discharge Plan and Services In-house Referral: NA Discharge Planning Services: CM Consult Post Acute Care Choice: Home Health          DME Arranged: (owns w/c, BSC, walker , cane, lift chair)         HH Arranged: RN, PT, OT HH Agency: [place] ([facility]) Date [place]: 04/30/19 Time [place]: [place] Representative spoke with at [place]: [place] ([place]) Interventions     Readmission Risk Interventions No flowsheet data found.

## 2019-05-02 NOTE — Discharge Instructions (Signed)
[facility]      [facility] [phone]  OPEN ABDOMINAL SURGERY: POST OP INSTRUCTIONS  Always review your discharge instruction sheet given to you by the facility where your surgery was performed.  IF YOU HAVE DISABILITY OR FAMILY LEAVE FORMS, YOU MUST BRING THEM TO THE OFFICE FOR PROCESSING.  PLEASE DO NOT GIVE THEM TO YOUR DOCTOR.  1. A prescription for pain medication may be given to you upon discharge.  Take your pain medication as prescribed, if needed.  If narcotic pain medicine is not needed, then you may take acetaminophen (Tylenol) or ibuprofen (Advil) as needed. 2. Take your usually prescribed medications unless otherwise directed. 3. If you need a refill on your pain medication, please contact your pharmacy. They will contact our office to request authorization.  Prescriptions will not be filled after 5pm or on week-ends. 4. You should follow a light diet the first few days after arrival home, such as soup and crackers, pudding, etc.unless your doctor has advised otherwise. A high-fiber, low fat diet can be resumed as tolerated.   Be sure to include lots of fluids daily. Most patients will experience some swelling and bruising on the chest and neck area.  Ice packs will help.  Swelling and bruising can take several days to resolve 5. Most patients will experience some swelling and bruising in the area of the incision. Ice pack will help. Swelling and bruising can take several days to resolve..  6. It is common to experience some constipation if taking pain medication after surgery.  Increasing fluid intake and taking a stool softener will usually help or prevent this problem from occurring.  A mild laxative (Milk of Magnesia or Miralax) should be taken according to package directions if there are no bowel movements after 48 hours. 7.  You may have steri-strips (small skin tapes) in place directly over the incision.  These strips should be left on the skin for 7-10 days.  If your  surgeon used skin glue on the incision, you may shower in 24 hours.  The glue will flake off over the next 2-3 weeks.  Any sutures or staples will be removed at the office during your follow-up visit. You may find that a light gauze bandage over your incision may keep your staples from being rubbed or pulled. You may shower and replace the bandage daily. 8. ACTIVITIES:  You may resume regular (light) daily activities beginning the next day--such as daily self-care, walking, climbing stairs--gradually increasing activities as tolerated.  You may have sexual intercourse when it is comfortable.  Refrain from any heavy lifting or straining until approved by your doctor. a. You may drive when you no longer are taking prescription pain medication, you can comfortably wear a seatbelt, and you can safely maneuver your car and apply brakes  9. You should see your doctor in the office for a follow-up appointment approximately two weeks after your surgery.  Make sure that you call for this appointment within a day or two after you arrive home to insure a convenient appointment time.  WHEN TO CALL YOUR DOCTOR: 1. Fever over 101.0 2. Inability to urinate 3. Nausea and/or vomiting 4. Extreme swelling or bruising 5. Continued bleeding from incision. 6. Increased pain, redness, or drainage from the incision. 7. Difficulty swallowing or breathing 8. Muscle cramping or spasms. 9. Numbness or tingling in hands or feet or around lips.  The clinic staff is available to answer your questions during regular business hours.  Please   don't hesitate to call and ask to speak to one of the nurses if you have concerns.  For further questions, please visit [email or web address]   

## 2019-05-02 NOTE — Progress Notes (Signed)
PMT consult received and chart reviewed. Patient awake, alert, pleasant and eating breakfast. She denies pain or discomfort. She is eager to return home. No family at bedside.   Discussed with Dr. Starla Link. Discharge orders for home today and no needs per attending. Patient appropriate for outpatient palliative referral. RN CM notified.   NO CHARGE  Ihor Dow, [place], FNP-C Palliative Medicine Team  Phone: [phone] Fax: [phone]

## 2020-07-23 IMAGING — DX DG HIP (WITH OR WITHOUT PELVIS) 2-3V LEFT
3 series · 3 of 3 positions shown · non-contrast
Comparison: 11/28/2018

CLINICAL DATA: Postop

EXAM:
DG HIP (WITH OR WITHOUT PELVIS) 2-3V LEFT

[pelvis ap]
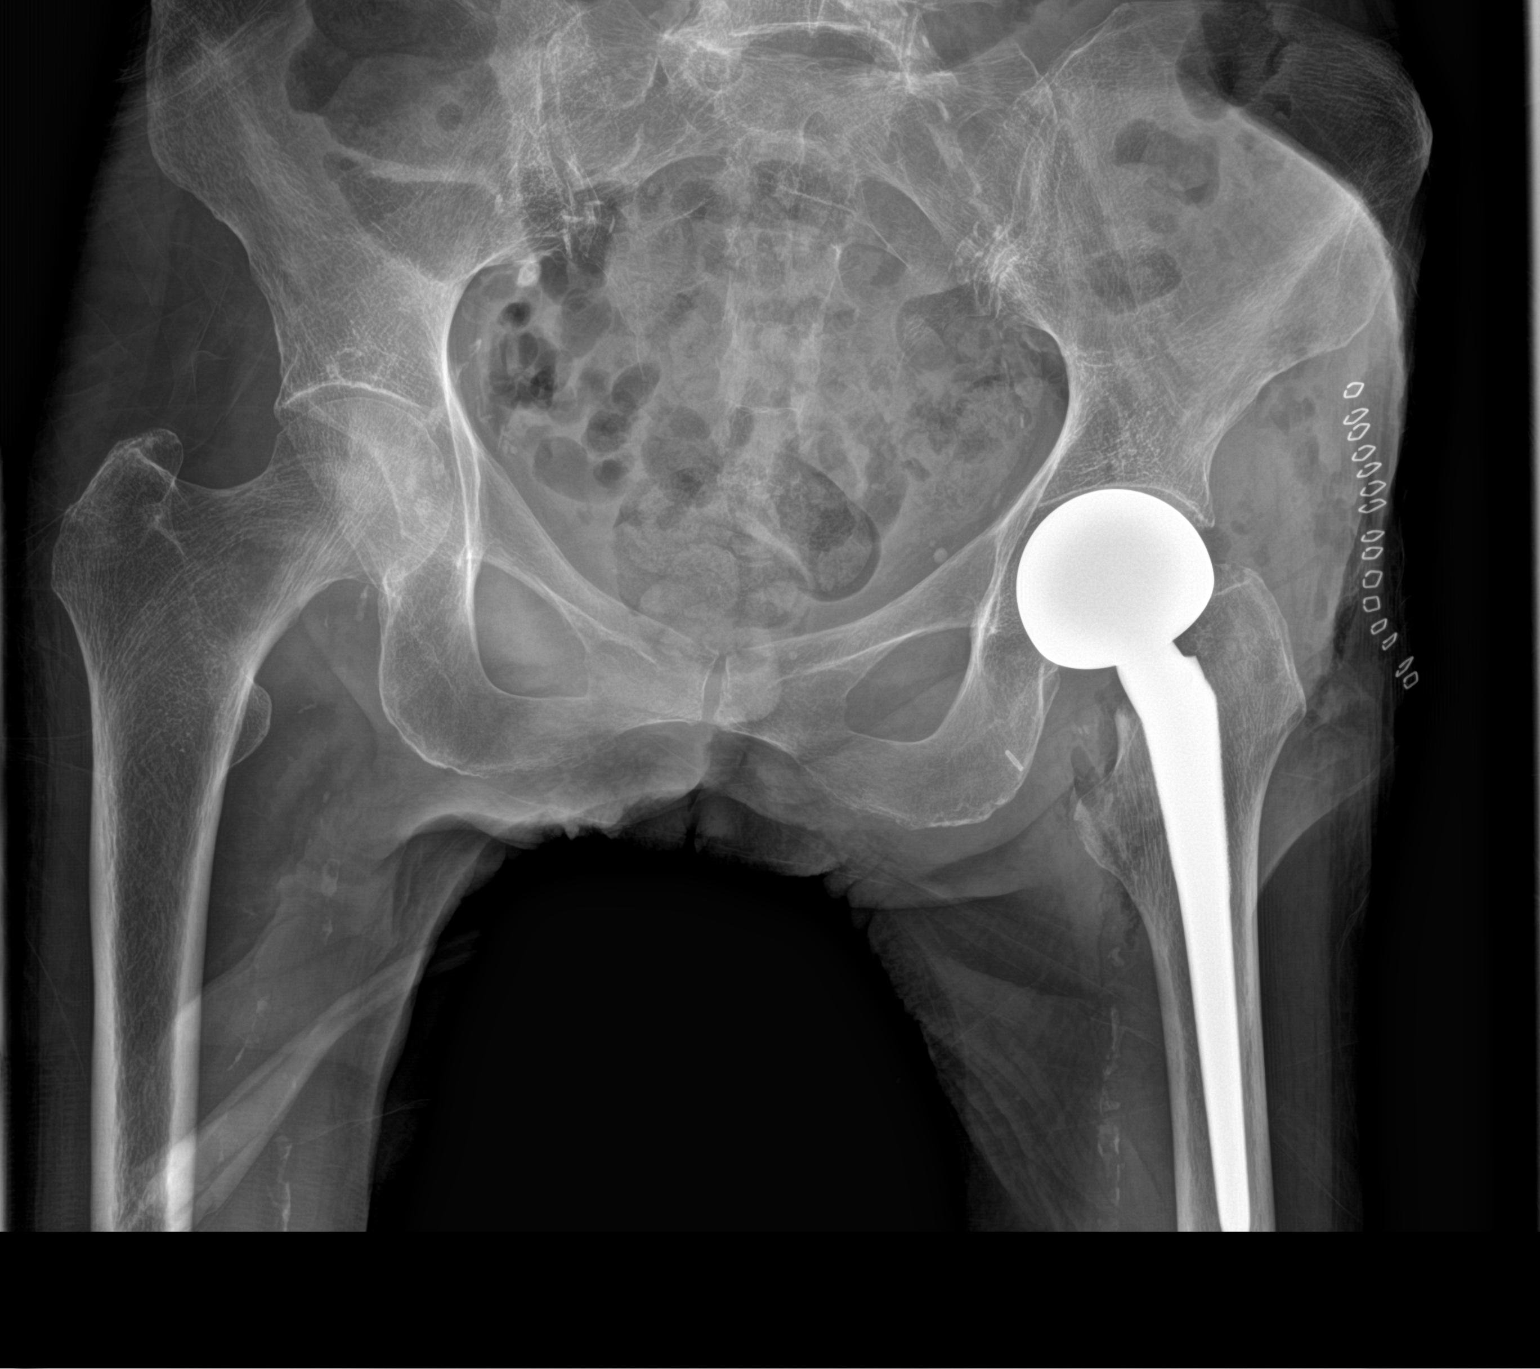

[hip lat (1 of 2)]
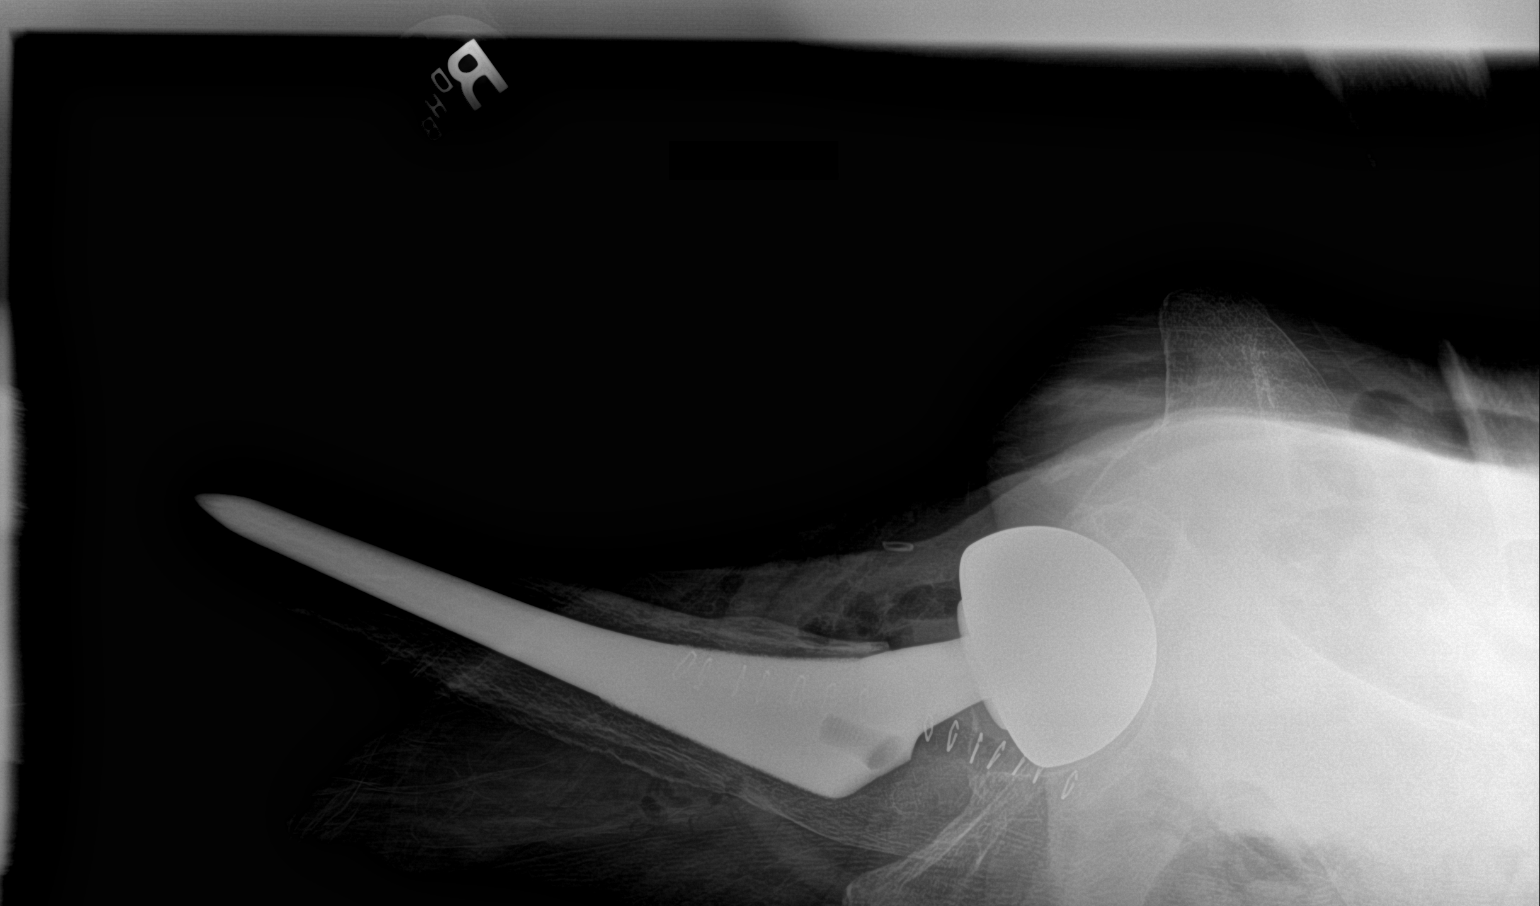

[hip lat (2 of 2)]
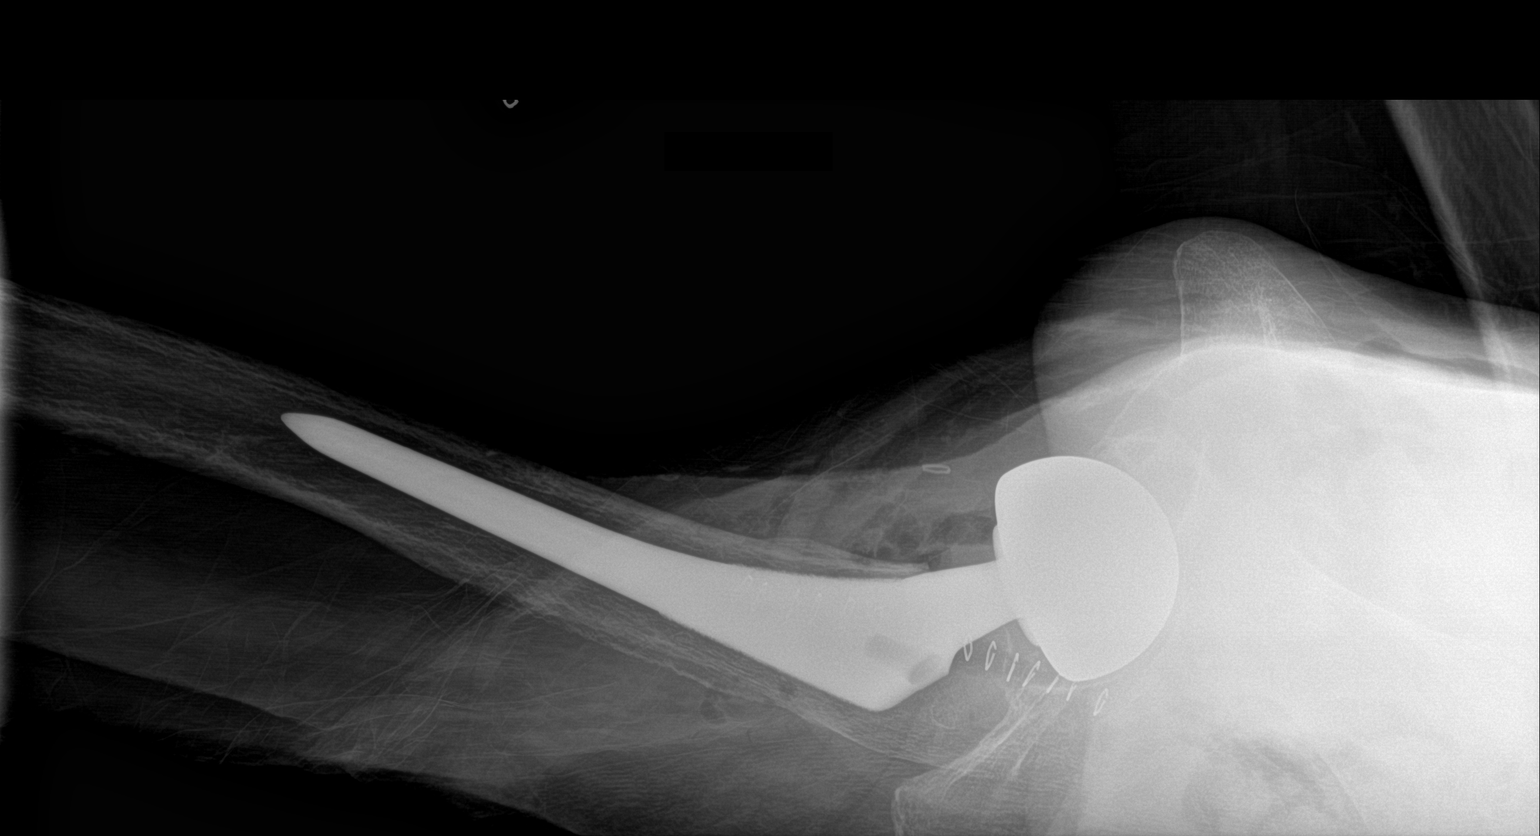

[3 of 3 positions shown; findings below may reference images not displayed]

FINDINGS: Status post left hip replacement with intact hardware and normal
alignment. Gas in the soft tissues consistent with recent surgery.
Vascular calcifications.
IMPRESSION: Interval left hip replacement with expected postsurgical changes

## 2021-03-19 ENCOUNTER — Encounter: Payer: Self-pay | Admitting: General Surgery

## 2021-07-03 ENCOUNTER — Inpatient Hospital Stay: Payer: Medicare Other

## 2021-07-03 ENCOUNTER — Emergency Department: Payer: Medicare Other

## 2021-07-03 ENCOUNTER — Inpatient Hospital Stay
Admission: EM | Admit: 2021-07-03 | Discharge: 2021-07-06 | DRG: 086 | Disposition: A | Discharge status: Hospice | Payer: Medicare Other | Attending: Student in an Organized Health Care Education/Training Program | Admitting: Student in an Organized Health Care Education/Training Program

## 2021-07-03 ENCOUNTER — Other Ambulatory Visit: Payer: Self-pay

## 2021-07-03 DIAGNOSIS — Z8673 Personal history of transient ischemic attack (TIA), and cerebral infarction without residual deficits: Secondary | ICD-10-CM

## 2021-07-03 DIAGNOSIS — Z515 Encounter for palliative care: Secondary | ICD-10-CM

## 2021-07-03 DIAGNOSIS — I252 Old myocardial infarction: Secondary | ICD-10-CM | POA: Diagnosis not present

## 2021-07-03 DIAGNOSIS — I609 Nontraumatic subarachnoid hemorrhage, unspecified: Secondary | ICD-10-CM | POA: Diagnosis present

## 2021-07-03 DIAGNOSIS — R402352 Coma scale, best motor response, localizes pain, at arrival to emergency department: Secondary | ICD-10-CM | POA: Diagnosis present

## 2021-07-03 DIAGNOSIS — I480 Paroxysmal atrial fibrillation: Secondary | ICD-10-CM | POA: Diagnosis present

## 2021-07-03 DIAGNOSIS — Z66 Do not resuscitate: Secondary | ICD-10-CM | POA: Diagnosis present

## 2021-07-03 DIAGNOSIS — I110 Hypertensive heart disease with heart failure: Secondary | ICD-10-CM | POA: Diagnosis present

## 2021-07-03 DIAGNOSIS — Y92009 Unspecified place in unspecified non-institutional (private) residence as the place of occurrence of the external cause: Secondary | ICD-10-CM

## 2021-07-03 DIAGNOSIS — R402232 Coma scale, best verbal response, inappropriate words, at arrival to emergency department: Secondary | ICD-10-CM | POA: Diagnosis present

## 2021-07-03 DIAGNOSIS — I5042 Chronic combined systolic (congestive) and diastolic (congestive) heart failure: Secondary | ICD-10-CM | POA: Diagnosis present

## 2021-07-03 DIAGNOSIS — F0390 Unspecified dementia without behavioral disturbance: Secondary | ICD-10-CM | POA: Diagnosis present

## 2021-07-03 DIAGNOSIS — Z20822 Contact with and (suspected) exposure to covid-19: Secondary | ICD-10-CM | POA: Diagnosis present

## 2021-07-03 DIAGNOSIS — W19XXXA Unspecified fall, initial encounter: Secondary | ICD-10-CM | POA: Diagnosis present

## 2021-07-03 DIAGNOSIS — R402132 Coma scale, eyes open, to sound, at arrival to emergency department: Secondary | ICD-10-CM | POA: Diagnosis present

## 2021-07-03 DIAGNOSIS — E785 Hyperlipidemia, unspecified: Secondary | ICD-10-CM | POA: Diagnosis present

## 2021-07-03 DIAGNOSIS — T1490XA Injury, unspecified, initial encounter: Secondary | ICD-10-CM | POA: Diagnosis not present

## 2021-07-03 DIAGNOSIS — R64 Cachexia: Secondary | ICD-10-CM | POA: Diagnosis present

## 2021-07-03 DIAGNOSIS — R627 Adult failure to thrive: Secondary | ICD-10-CM | POA: Diagnosis present

## 2021-07-03 DIAGNOSIS — E119 Type 2 diabetes mellitus without complications: Secondary | ICD-10-CM | POA: Diagnosis present

## 2021-07-03 DIAGNOSIS — Z880 Allergy status to penicillin: Secondary | ICD-10-CM | POA: Diagnosis not present

## 2021-07-03 DIAGNOSIS — S066X0A Traumatic subarachnoid hemorrhage without loss of consciousness, initial encounter: Principal | ICD-10-CM

## 2021-07-03 DIAGNOSIS — S42201A Unspecified fracture of upper end of right humerus, initial encounter for closed fracture: Secondary | ICD-10-CM

## 2021-07-03 DIAGNOSIS — I619 Nontraumatic intracerebral hemorrhage, unspecified: Secondary | ICD-10-CM | POA: Diagnosis not present

## 2021-07-03 DIAGNOSIS — Z681 Body mass index (BMI) 19 or less, adult: Secondary | ICD-10-CM

## 2021-07-03 DIAGNOSIS — K219 Gastro-esophageal reflux disease without esophagitis: Secondary | ICD-10-CM | POA: Diagnosis present

## 2021-07-03 DIAGNOSIS — I2510 Atherosclerotic heart disease of native coronary artery without angina pectoris: Secondary | ICD-10-CM | POA: Diagnosis present

## 2021-07-03 DIAGNOSIS — Z8542 Personal history of malignant neoplasm of other parts of uterus: Secondary | ICD-10-CM

## 2021-07-03 LAB — TYPE AND SCREEN
ABO/RH(D): A POS
Antibody Screen: NEGATIVE

## 2021-07-03 LAB — COMPREHENSIVE METABOLIC PANEL
ALT: 18 U/L (ref 0–44)
AST: 22 U/L (ref 15–41)
Albumin: 3.7 g/dL (ref 3.5–5.0)
Alkaline Phosphatase: 73 U/L (ref 38–126)
Anion gap: 8 (ref 5–15)
BUN: 25 mg/dL — ABNORMAL HIGH (ref 8–23)
CO2: 28 mmol/L (ref 22–32)
Calcium: 9.2 mg/dL (ref 8.9–10.3)
Chloride: 100 mmol/L (ref 98–111)
Creatinine, Ser: 0.62 mg/dL (ref 0.44–1.00)
GFR, Estimated: >60 mL/min (ref >60)
Glucose, Bld: 251 mg/dL — ABNORMAL HIGH (ref 70–99)
Potassium: 4.3 mmol/L (ref 3.5–5.1)
Sodium: 136 mmol/L (ref 135–145)
Total Bilirubin: 0.5 mg/dL (ref 0.3–1.2)
Total Protein: 6.1 g/dL — ABNORMAL LOW (ref 6.5–8.1)

## 2021-07-03 LAB — CBC WITH DIFFERENTIAL/PLATELET
Abs Immature Granulocytes: 0.05 10*3/uL (ref 0.00–0.07)
Basophils Absolute: 0 10*3/uL (ref 0.0–0.1)
Basophils Relative: 0 %
Eosinophils Absolute: 0 10*3/uL (ref 0.0–0.5)
Eosinophils Relative: 0 %
HCT: 43.7 % (ref 36.0–46.0)
Hemoglobin: 14.4 g/dL (ref 12.0–15.0)
Immature Granulocytes: 1 %
Lymphocytes Relative: 4 %
Lymphs Abs: 0.3 10*3/uL — ABNORMAL LOW (ref 0.7–4.0)
MCH: 29.2 pg (ref 26.0–34.0)
MCHC: 33 g/dL (ref 30.0–36.0)
MCV: 88.6 fL (ref 80.0–100.0)
Monocytes Absolute: 0.2 10*3/uL (ref 0.1–1.0)
Monocytes Relative: 3 %
Neutro Abs: 7.4 10*3/uL (ref 1.7–7.7)
Neutrophils Relative %: 92 %
Platelets: 208 10*3/uL (ref 150–400)
RBC: 4.93 MIL/uL (ref 3.87–5.11)
RDW: 13.2 % (ref 11.5–15.5)
WBC: 8 10*3/uL (ref 4.0–10.5)
nRBC: 0 % (ref 0.0–0.2)

## 2021-07-03 LAB — MAGNESIUM: Magnesium: 1.9 mg/dL (ref 1.7–2.4)

## 2021-07-03 LAB — BRAIN NATRIURETIC PEPTIDE: B Natriuretic Peptide: 1910.7 pg/mL — ABNORMAL HIGH (ref 0.0–100.0)

## 2021-07-03 LAB — RESP PANEL BY RT-PCR (FLU A&B, COVID) ARPGX2
Influenza A by PCR: NEGATIVE
Influenza B by PCR: NEGATIVE
SARS Coronavirus 2 by RT PCR: NEGATIVE

## 2021-07-03 LAB — TROPONIN I (HIGH SENSITIVITY)
Troponin I (High Sensitivity): 25 ng/L — ABNORMAL HIGH (ref <18)
Troponin I (High Sensitivity): 24 ng/L — ABNORMAL HIGH (ref <18)

## 2021-07-03 LAB — LACTIC ACID, PLASMA: Lactic Acid, Venous: 1.3 mmol/L (ref 0.5–1.9)

## 2021-07-03 MED ORDER — ONDANSETRON HCL 4 MG/2ML IJ SOLN
4.0000 mg | Freq: Once | INTRAMUSCULAR | Status: AC
  Administered 2021-07-03: 4 mg via INTRAVENOUS
  Filled 2021-07-03: qty 2

## 2021-07-03 MED ORDER — SENNOSIDES-DOCUSATE SODIUM 8.6-50 MG PO TABS
1.0000 | ORAL_TABLET | Freq: Two times a day (BID) | ORAL | Status: DC
  Filled 2021-07-03: qty 1

## 2021-07-03 MED ORDER — LABETALOL HCL 5 MG/ML IV SOLN
10.0000 mg | Freq: Once | INTRAVENOUS | Status: AC
  Administered 2021-07-03: 10 mg via INTRAVENOUS
  Filled 2021-07-03: qty 4

## 2021-07-03 MED ORDER — ACETAMINOPHEN 650 MG RE SUPP: 650.0000 mg | RECTAL | Status: DC | PRN

## 2021-07-03 MED ORDER — PANTOPRAZOLE SODIUM 40 MG IV SOLR
40.0000 mg | Freq: Every day | INTRAVENOUS | Status: DC
  Administered 2021-07-04: 40 mg via INTRAVENOUS
  Filled 2021-07-03: qty 40

## 2021-07-03 MED ORDER — STROKE: EARLY STAGES OF RECOVERY BOOK: Freq: Once | Status: AC

## 2021-07-03 MED ORDER — INSULIN ASPART 100 UNIT/ML IJ SOLN
0.0000 [IU] | INTRAMUSCULAR | Status: DC
  Administered 2021-07-04: 5 [IU] via SUBCUTANEOUS
  Administered 2021-07-04: 2 [IU] via SUBCUTANEOUS
  Administered 2021-07-04: 1 [IU] via SUBCUTANEOUS
  Administered 2021-07-04: 2 [IU] via SUBCUTANEOUS
  Filled 2021-07-03 (×4): qty 1

## 2021-07-03 MED ORDER — MORPHINE SULFATE (PF) 4 MG/ML IV SOLN
4.0000 mg | Freq: Once | INTRAVENOUS | Status: AC
  Administered 2021-07-03: 4 mg via INTRAVENOUS
  Filled 2021-07-03: qty 1

## 2021-07-03 MED ORDER — ACETAMINOPHEN 325 MG PO TABS: 650.0000 mg | ORAL_TABLET | ORAL | Status: DC | PRN

## 2021-07-03 MED ORDER — ACETAMINOPHEN 160 MG/5ML PO SOLN
650.0000 mg | ORAL | Status: DC | PRN
  Filled 2021-07-03: qty 20.3

## 2021-07-03 NOTE — H&P (Addendum)
NAME:  Yolanda Turner, MRN:  [id number], DOB:  08/14/1925, LOS: 0 ADMISSION DATE:  07/03/2021, CONSULTATION DATE:  07/03/2021 REFERRING MD:  Blake Divine MD  CHIEF COMPLAINT:  Fall    HPI  88 y.o female with significant PMH of CVA, CAD s/p MI, DM, GERD, hypertension, small bowel obstruction, paroxysmal atrial fibrillation, chronic systolic HF, left foot ulcers, uterine cancer in renal artery stenosis who presented to the ED with chief complaints of increasing confusion for the past 2 days with a witnessed fall today.  Patient was seen by PCP yesterday for altered mental status and possible UTI which was negative.  Had a witnessed fall at home with reports of hitting her head but did not lose consciousness.  He is currently not on any anticoagulation but is on antiplatelets (Plavix).  ED Course: On arrival to the ED, he was afebrile with blood pressure 167/79 mm Hg and pulse rate 72 beats/min, sats 98% on R.A.  There were no focal neurological deficits; patient was alert and oriented to self only with notable right arm guarding. CTH showed small amount of bilateral IVH blood with trace amount of SAH at the left temporal lobe.  Xray of the right elbow showed Acute displaced and mildly angulated fracture involving proximal shaft of the humerus. Pertinent Labs in Red/Diagnostics Findings: Na+/ K+: 136/4.3 Glucose:251 BUN/Cr.:25/0.62   WBC/ TMAX: 8.0/ afebrile Hgb/Hct: 14.4/43.7 Plts: 208 COVID PCR: Negative   Troponin: 25 BNP: 1910.7 The above findings were discussed with on-call neurosurgery who recommended a follow-up CT head in 6 hours with no indication for transfer to tertiary center.  X-ray of the right shoulder also discussed with on-call orthopedic surgeon who recommends right shoulder immobilizer.  Patient admitted to ICU for close monitoring.  PCCM consulted Past Medical History  CVA CAD s/p MI GERD HTN SBO DM Paroxysmal atrial fibrillation Chronic systolic heart failure Left  foot ulcers Uterine cancer Renal artery stenosis  Significant Hospital Events   1/21: Admitted to the ICU with traumatic SAH/IVH  Consults:  Neurosurgery Orthopedic  Procedures:  NONE  Significant Diagnostic Tests:  1/21: Chest Xray>No acute airspace disease. Mild cardiomegaly and apical fibrosis and scarring 1/21: RIGHT shoulder  xray>Acute displaced and mildly angulated fracture involving proximal shaft of the humerus 1/21: Noncontrast CT head>Small amount of bilateral intraventricular blood with trace amount of subarachnoid blood at the left temporal lobe. No significant mass effect or midline shift. 1/21: CT Cervical spine > No acute abnormality  Micro Data:  1/21: SARS-CoV-2 PCR> negative 1/21: Influenza PCR> negative 1/21: Blood culture x2> 1/21: Urine Culture> 1/21: MRSA PCR>>   Antimicrobials:  None  OBJECTIVE  Blood pressure (!) 149/80, pulse 64, temperature 98.2 F (36.8 C), temperature source Oral, resp. rate 18, SpO2 94 %.       No intake or output data in the 24 hours ending 07/03/21 1933 There were no vitals filed for this visit.  Physical Examination  GENERAL: 88 year-old  thin and cachetic patient lying in the bed with no acute distress.  EYES: Pupils equal, round, reactive to light and accommodation. No scleral icterus. Extraocular muscles intact.  HEENT: Head traumatic, bruising to posterior right head, normocephalic. Oropharynx and nasopharynx clear.  NECK:  Supple, no jugular venous distention. No thyroid enlargement, no tenderness.  LUNGS: Normal breath sounds bilaterally, no wheezing, rales,rhonchi or crepitation. No use of accessory muscles of respiration.  CARDIOVASCULAR: S1, S2 normal. No murmurs, rubs, or gallops.  ABDOMEN: Soft, nontender, nondistended. Bowel sounds present. No organomegaly  or mass.  EXTREMITIES: No pedal edema, cyanosis, or clubbing. Limited ROM RUE due to humerus fracture NEUROLOGIC: Cranial nerves II through XII are intact.   Muscle strength not assessed in all extremities. Sensation intact. Gait not checked.  PSYCHIATRIC: The patient is alert and oriented x 1 SKIN: Multiple bruising in lower and upper extremities.  Labs/imaging that I havepersonally reviewed  (right click and "Reselect all SmartList Selections" daily)     Labs   CBC: Recent Labs  Lab 07/03/21 1709  WBC 8.0  NEUTROABS 7.4  HGB 14.4  HCT 43.7  MCV 88.6  PLT 629    Basic Metabolic Panel: No results for input(s): NA, K, CL, CO2, GLUCOSE, BUN, CREATININE, CALCIUM, MG, PHOS in the last 168 hours. GFR: CrCl cannot be calculated (No successful lab value found.). Recent Labs  Lab 07/03/21 1709  WBC 8.0    Liver Function Tests: No results for input(s): AST, ALT, ALKPHOS, BILITOT, PROT, ALBUMIN in the last 168 hours. No results for input(s): LIPASE, AMYLASE in the last 168 hours. No results for input(s): AMMONIA in the last 168 hours.  ABG No results found for: PHART, PCO2ART, PO2ART, HCO3, TCO2, ACIDBASEDEF, O2SAT   Coagulation Profile: No results for input(s): INR, PROTIME in the last 168 hours.  Cardiac Enzymes: No results for input(s): CKTOTAL, CKMB, CKMBINDEX, TROPONINI in the last 168 hours.  HbA1C: No results found for: HGBA1C  CBG: No results for input(s): GLUCAP in the last 168 hours.  Review of Systems:   UNABLE TO OBTAIN DUE TO ALTERED MENTAL STATUS  Past Medical History  She,  has no past medical history on file.   Surgical History   History reviewed. No pertinent surgical history.   Social History      Family History   Her family history is not on file.   Allergies No Known Allergies   Home Medications  Prior to Admission medications   Not on File  Scheduled Meds: Continuous Infusions: PRN Meds:.  Assessment & Plan:  Traumatic Intracranial Hemorrhage s/p Mechanical fall CTH  shows bilateral intraventricular blood with trace amount of subarachnoid blood at the left temporal lobe - Goal SBP  130 -150 mm Hg using prn Labetolol or Nicardipine Gtt if needed - HOB >= 30 degrees with aspiration precautions - Goal Na>145 w/ follow BMP - INR goal <1.4, daily coags.  - Pain control - No HepSQ, antiplatelet or anticoagulant agents at this time - Place SCDs for DVT prevention. - NPO for now until passes swallow screen  - PT consult, OT consult, Speech consult - Aspiration precautions, Bleeding precautions - Obtain STAT head CT for any new acute headache or new neurological deficits - Neurosurgery consult, input appreciated  Acute Displaced Right Humerus Fracture - Right shoulder Immobilizer - PRN pain management - Neurovascular checks - Monitor for s/s compartment syndrome - Falls precautions - Ortho consult, recommend no surgical intervention at this time  Altered Mental Status Likely multifactorial in the setting of deconditioning, fall, age and multiple co-morbidities. No s/s infectious process -Follow cultures -Dietary consult for nutritional guidance once able to take po  Chronic HFpEF (last known LVEF 60 to 65% on 04/28/2019) Hypertension Hx: CAD status post MI, HLD BNP elevated at 1910 however does not appear volume overloaded - Continuous cardiac monitoring - Maintain MAP greater than 65 - Lasix as blood pressure and renal function permits; currently on Lasix 20 mg daily - Continue Lisinopril, metoprolol once able to tolerate po - Continue Simvastatin once able to  tolerate po - Hold Plavix in the setting of ICH - Repeat 2D Echocardiogram  Paroxysmal Atrial Fibrillation Rate controlled -Metoprolol once able to take po -Hold Anticoagulation in the setting of ICH   Diabetes mellitus - Check Hemoglobin A1c - CBGs - Sliding scale insulin - Follow ICU hyper/hypoglycemia protocol - Hold home Meds for now     Best practice:  Diet:  NPO Pain/Anxiety/Delirium protocol (if indicated): No VAP protocol (if indicated): Not indicated DVT prophylaxis:  Contraindicated GI prophylaxis: N/A Glucose control:  SSI Yes Central venous access:  N/A Arterial line:  N/A Foley:  N/A Mobility:  bed rest  PT consulted: Yes Last date of multidisciplinary goals of care discussion [1/21] Code Status:  full code Disposition: ICU   = Goals of Care = Code Status Order: FULL  Primary Emergency Contact: [place], Home Phone: [phone] Wishes to pursue full aggressive treatment and intervention options, including CPR and intubation, but goals of care will be addressed on going with family if that should become necessary.  Critical care time: 45 minutes     Rufina Falco, DNP, CCRN, FNP-C, AGACNP-BC Acute Care Nurse Practitioner  [facility] Pager: [phone] [place] [facility]  .

## 2021-07-03 NOTE — ED Provider Notes (Signed)
[facility] Provider Note    Event Date/Time   First MD Initiated Contact with Patient 07/03/21 1625     (approximate)   History   Chief Complaint Fall   HPI  Yolanda Turner is a 88 y.o. female with past medical history of hypertension and hyperlipidemia who presents to the ED for fall and altered mental status.  History is limited due to patient's confusion and majority of history is obtained from EMS.  Family had reported to EMS that patient had been increasingly weak and confused over the past couple of days.  She was reportedly seen at her PCPs office yesterday where UTI was ruled out and no clear cause of her symptoms was identified.  Patient then had a fall today at home that was witnessed by family when she reportedly struck her head but did not lose consciousness.  Family reported patient is anticoagulated on Plavix.  Patient complains of right shoulder pain but otherwise denies any complaints.     Physical Exam   Triage Vital Signs: ED Triage Vitals [07/03/21 1622]  Enc Vitals Group     BP      Pulse      Resp      Temp      Temp src      SpO2      Weight      Height      Head Circumference      Peak Flow      Pain Score 7     Pain Loc      Pain Edu?      Excl. in [place]?     Most recent vital signs: Vitals:   07/03/21 1800 07/03/21 1822  BP: (!) 167/79 (!) 149/80  Pulse: 72 64  Resp:  18  Temp:    SpO2: 98% 94%    Constitutional: Alert and oriented to person, but not place or time.  Thin and cachectic appearing. Eyes: Conjunctivae are normal.  Pupils equal, round, and reactive to light bilaterally. Head: Atraumatic. Nose: No congestion/rhinnorhea. Mouth/Throat: Mucous membranes are moist.  Cardiovascular: Normal rate, regular rhythm. Grossly normal heart sounds.  2+ radial pulses bilaterally. Respiratory: Normal respiratory effort.  No retractions. Lungs CTAB. Gastrointestinal: Soft and nontender. No  distention. Musculoskeletal: Tenderness to palpation noted at right shoulder and right elbow with ecchymosis noted at right shoulder, no obvious deformity.  No lower extremity tenderness nor edema.  Neurologic: Slurred speech noted. No gross focal neurologic deficits are appreciated.    ED Results / Procedures / Treatments   Labs (all labs ordered are listed, but only abnormal results are displayed) Labs Reviewed  CBC WITH DIFFERENTIAL/PLATELET - Abnormal; Notable for the following components:      Result Value   Lymphs Abs 0.3 (*)    All other components within normal limits  TROPONIN I (HIGH SENSITIVITY) - Abnormal; Notable for the following components:   Troponin I (High Sensitivity) 25 (*)    All other components within normal limits  RESP PANEL BY RT-PCR (FLU A&B, COVID) ARPGX2  URINALYSIS, ROUTINE W REFLEX MICROSCOPIC  COMPREHENSIVE METABOLIC PANEL  MAGNESIUM  TYPE AND SCREEN  TROPONIN I (HIGH SENSITIVITY)     EKG  ED ECG REPORT I, Blake Divine, the attending physician, personally viewed and interpreted this ECG.   Date: 07/03/2021  EKG Time: 16:32  Rate: 69  Rhythm: normal sinus rhythm  Axis: LAD  Intervals:left bundle branch block  ST&T Change: None  RADIOLOGY CT head  reviewed by me with small around of [place] with small amount of bilateral intraventricular hemorrhage.  PROCEDURES:  Critical Care performed: Yes, see critical care procedure note(s)  .Critical Care Performed by: Blake Divine, MD Authorized by: Blake Divine, MD   Critical care provider statement:    Critical care time (minutes):  45   Critical care time was exclusive of:  Separately billable procedures and treating other patients and teaching time   Critical care was necessary to treat or prevent imminent or life-threatening deterioration of the following conditions:  CNS failure or compromise   Critical care was time spent personally by me on the following activities:  Development of  treatment plan with patient or surrogate, discussions with consultants, evaluation of patient's response to treatment, examination of patient, ordering and review of laboratory studies, ordering and review of radiographic studies, ordering and performing treatments and interventions, pulse oximetry, re-evaluation of patient's condition and review of old charts   I assumed direction of critical care for this patient from another provider in my specialty: no     Care discussed with: admitting provider   .1-3 Lead EKG Interpretation Performed by: Blake Divine, MD Authorized by: Blake Divine, MD     Interpretation: normal     ECG rate:  65-80   ECG rate assessment: normal     Rhythm: sinus rhythm     Ectopy: none     Conduction: normal     MEDICATIONS ORDERED IN ED: Medications  labetalol (NORMODYNE) injection 10 mg (10 mg Intravenous Given 07/03/21 1804)  morphine 4 MG/ML injection 4 mg (4 mg Intravenous Given 07/03/21 1827)  ondansetron (ZOFRAN) injection 4 mg (4 mg Intravenous Given 07/03/21 1823)     IMPRESSION / MDM / ASSESSMENT AND PLAN / ED COURSE  I reviewed the triage vital signs and the nursing notes.                              88 y.o. female with past medical history of hypertension and hyperlipidemia who presents to the ED complaining of increasing confusion and weakness for the past couple of days with a witnessed fall at home earlier today.  Differential diagnosis includes, but is not limited to, traumatic head injury, cervical spine injury, right shoulder dislocation, proximal humerus fracture, stroke, UTI, pneumonia, electrolyte abnormality.  Patient is nontoxic-appearing and in no acute distress, does appear disoriented and only able to state her name and answer yes or no questions.  Vital signs are reassuring and patient has no focal neurologic deficits on exam, however she is noted to slur her speech, reportedly oriented x4 at baseline according to what family had  reported to EMS.  EKG shows left bundle blanch block with no ischemic changes.  We will check CT head and cervical spine, x-rays of right shoulder and elbow.  Labs and UA are pending.  CT head is concerning for small left temporal subarachnoid hemorrhage with bilateral intraventricular small volume of blood.  This appears likely related to patient's fall, CT cervical spine is negative for acute process.  Case discussed with Dr. Tamala Julian of neurosurgery, who recommends maintaining systolic blood pressure less than 180 and recommends against seizure prophylaxis.  He recommends follow-up CT in 6 hours and states there is no indication for transfer to trauma center, patient may be observed for neurochecks here in the ICU.  X-rays of right shoulder reviewed by me with proximal humerus fracture noted.  This was  discussed with Dr. Posey Pronto of orthopedics, who agrees with plan for right shoulder immobilizer.  No apparent injuries to patient's right elbow.  Labs thus far are reassuring, CBC shows no anemia or leukocytosis, troponin is mildly elevated but low suspicion for ACS.  Testing for influenza and COVID-19 is negative.  Case discussed with ICU provider for admission.  The patient is on the cardiac monitor to evaluate for evidence of arrhythmia and/or significant heart rate changes.       FINAL CLINICAL IMPRESSION(S) / ED DIAGNOSES   Final diagnoses:  Trauma  Subarachnoid hemorrhage following injury, no loss of consciousness, initial encounter ([place])  Closed fracture of proximal end of right humerus, unspecified fracture morphology, initial encounter     Rx / DC Orders   ED Discharge Orders     None        Note:  This document was prepared using Dragon voice recognition software and may include unintentional dictation errors.   Blake Divine, MD 07/03/21 1921

## 2021-07-03 NOTE — ED Notes (Signed)
No drug allergies per family's report.

## 2021-07-03 NOTE — ED Triage Notes (Signed)
Pt here via EMS after witnessed mechanical fall- pt from home w/ family. Family reports pt is usually oriented x4. Pt on blood thinner, no LOC. Pt holding right arm- per family pt hit arm on table as she fell. Pt is alert, oriented to self only.

## 2021-07-03 NOTE — Consult Note (Signed)
Consulting Department: ED  Primary Physician:  Albina Billet, MD  Chief Complaint: Traumatic subarachnoid hemorrhage  History of Present Illness: 07/04/2021 Yolanda Turner is a 88 y.o. female who presents with the chief complaint of confusion and falls.  She has had a increasing history of recent falls, as well as confusion and change in mental status.  She was seen to rule out a urinary tract infection.  Family noted that she fell and struck her head but did not lose consciousness.  She has right arm pain.  She is unable to give any detailed history.  Family is not available for further history from this provider.  Review of Systems:  A 10 point review of systems is negative, except for the pertinent positives and negatives detailed in the HPI.  Past Medical History: Prior stroke MI Diabetes GERD Paroxysmal A. Fib Foot ulcers Congestive heart failure Uterine cancer   Past Surgical History: Unknown  Allergies: Allergies as of 07/03/2021   (No Known Allergies)    Medications:  Current Facility-Administered Medications:    acetaminophen (TYLENOL) tablet 650 mg, 650 mg, Oral, Q4H PRN **OR** acetaminophen (TYLENOL) 160 MG/5ML solution 650 mg, 650 mg, Per Tube, Q4H PRN **OR** acetaminophen (TYLENOL) suppository 650 mg, 650 mg, Rectal, Q4H PRN, Ouma, Bing Neighbors, NP   gabapentin (NEURONTIN) tablet 300 mg, 300 mg, Oral, QHS, Schertz, Michele Mcalpine, MD   insulin aspart (novoLOG) injection 0-9 Units, 0-9 Units, Subcutaneous, Q4H, Ouma, Bing Neighbors, NP, 2 Units at 07/04/21 1221   labetalol (NORMODYNE) injection 5 mg, 5 mg, Intravenous, Q2H PRN, Schertz, Michele Mcalpine, MD   lisinopril (ZESTRIL) tablet 5 mg, 5 mg, Oral, Daily, Schertz, Michele Mcalpine, MD   metoprolol succinate (TOPROL-XL) 24 hr tablet 50 mg, 50 mg, Oral, Daily, Schertz, Michele Mcalpine, MD   senna-docusate (Senokot-S) tablet 1 tablet, 1 tablet, Oral, BID, Ouma, Bing Neighbors, NP   simvastatin (ZOCOR) tablet 40 mg, 40 mg,  Oral, q1800, Schertz, Michele Mcalpine, MD   Social History: Social History   Tobacco Use   Smoking status: Unknown    Family Medical History: History reviewed. No pertinent family history.  Physical Examination: Vitals:   07/04/21 1400 07/04/21 1500  BP: (!) 161/75 (!) 159/61  Pulse: 77 80  Resp: 18 17  Temp:    SpO2: 97% 98%     General: Elderly cachectic appearing 88 year old woman who eyes open easily to voice.  She is able to attend and nod yes or no to questions.  She is a dentulous and difficult to understand when asked questions.  She was able to mouth her name to me.  Technique  NEUROLOGICAL:  General: In no acute distress.   Able to mouth her name.  Shakes her head yes or no to questions.  Does not know the location or date.  Pupils equal round and reactive to light.  Full Facial tone is symmetric.  Tongue protrusion is midline.  She is able to move all 4 extremities spontaneously.  And does have expected guarding in the right upper extremity.    GCS: Motor 5, eyes 3, speech 3 -GCS of 10  Imaging:  Narrative & Impression  CLINICAL DATA:  Follow-up known subarachnoid hemorrhage, fall, anticoagulation   EXAM: CT HEAD WITHOUT CONTRAST   TECHNIQUE: Contiguous axial images were obtained from the base of the skull through the vertex without intravenous contrast.   RADIATION DOSE REDUCTION: This exam was performed according to the departmental dose-optimization program which includes automated exposure control, adjustment of the  mA and/or kV according to patient size and/or use of iterative reconstruction technique.   COMPARISON:  07/03/2021   FINDINGS: Brain: No evidence of acute infarction, hydrocephalus, extra-axial collection or mass lesion/mass effect. Unchanged, layering blood product in the dependent bilateral lateral ventricles (series 2, image 15). Extensive periventricular and deep white matter hypodensity as well as areas of encephalomalacia involving  the bilateral MCA territories (series 2, image 22, 18). Previously described left temporal subarachnoid hemorrhage is not clearly appreciated on current examination (series 4, image 32). Moderate global cerebral volume loss.   Vascular: No hyperdense vessel or unexpected calcification.   Skull: Normal. Negative for fracture or focal lesion.   Sinuses/Orbits: No acute finding.   Other: None.   IMPRESSION: 1. Unchanged, layering blood product in the dependent bilateral lateral ventricles. 2. Previously described left temporal subarachnoid hemorrhage is not clearly appreciated on current examination. 3. Advanced small-vessel white matter disease and multifocal encephalomalacia, unchanged.     Electronically Signed   By: Delanna Ahmadi M.D.   On: 07/03/2021 20:45     I have personally reviewed the images and agree with the above interpretation.  Labs: CBC Latest Ref Rng & Units 07/04/2021 07/03/2021  WBC 4.0 - 10.5 K/uL 9.5 8.0  Hemoglobin 12.0 - 15.0 g/dL 12.6 14.4  Hematocrit 36.0 - 46.0 % 39.3 43.7  Platelets 150 - 400 K/uL 201 208       Assessment and Plan: [name] is a pleasant 88 y.o. female with history of prior CVA, uterine cancer, CHF, MI, diabetes.  She was having progressive confusion and has recently been seen by her primary care physician.  She has had multiple recent falls the most recent of which she struck her head but did not lose consciousness.  On physical examination, she is cachectic appearing, awakens to voice, moves all 4 extremities spontaneously and localizes to touch.  Mouths words and simple responses.  Able to shake her head yes or no.  Formal GCS score 10 out of 15.  CT scan demonstrates small amount of intraventricular and subarachnoid hemorrhage.  Repeat head CT is stable.  She does have ventriculomegaly with evidence of severe microvascular disease and encephalomalacia.  At this time she presents with a traumatic brain injury, given her wakeful  state and stable imaging, no interventional neuro monitoring devices are indicated.  Continue with neurological examinations.  Given her age and medical history, would not pursue active reversal given the small amount of hemorrhage.  She should work with PT, OT.  She should also be evaluated for underlying reasons of progressive confusion.  Attempted to reach the granddaughter but was unable to.  Also given her age would not give antiseizure medications.   Stevan Born, MD/MSCR Dept. of Neurosurgery

## 2021-07-04 ENCOUNTER — Inpatient Hospital Stay
Admit: 2021-07-04 | Discharge: 2021-07-04 | Disposition: A | Discharge status: Home / Self Care | Payer: Medicare Other | Attending: Nurse Practitioner | Admitting: Nurse Practitioner

## 2021-07-04 DIAGNOSIS — I609 Nontraumatic subarachnoid hemorrhage, unspecified: Secondary | ICD-10-CM

## 2021-07-04 LAB — CBC WITH DIFFERENTIAL/PLATELET
Abs Immature Granulocytes: 0.04 10*3/uL (ref 0.00–0.07)
Basophils Absolute: 0 10*3/uL (ref 0.0–0.1)
Basophils Relative: 0 %
Eosinophils Absolute: 0 10*3/uL (ref 0.0–0.5)
Eosinophils Relative: 0 %
HCT: 39.3 % (ref 36.0–46.0)
Hemoglobin: 12.6 g/dL (ref 12.0–15.0)
Immature Granulocytes: 0 %
Lymphocytes Relative: 2 %
Lymphs Abs: 0.2 10*3/uL — ABNORMAL LOW (ref 0.7–4.0)
MCH: 29.2 pg (ref 26.0–34.0)
MCHC: 32.1 g/dL (ref 30.0–36.0)
MCV: 91 fL (ref 80.0–100.0)
Monocytes Absolute: 0.5 10*3/uL (ref 0.1–1.0)
Monocytes Relative: 6 %
Neutro Abs: 8.7 10*3/uL — ABNORMAL HIGH (ref 1.7–7.7)
Neutrophils Relative %: 92 %
Platelets: 201 10*3/uL (ref 150–400)
RBC: 4.32 MIL/uL (ref 3.87–5.11)
RDW: 13.3 % (ref 11.5–15.5)
WBC: 9.5 10*3/uL (ref 4.0–10.5)
nRBC: 0 % (ref 0.0–0.2)

## 2021-07-04 LAB — MRSA NEXT GEN BY PCR, NASAL: MRSA by PCR Next Gen: NOT DETECTED

## 2021-07-04 LAB — BASIC METABOLIC PANEL
Anion gap: 11 (ref 5–15)
BUN: 25 mg/dL — ABNORMAL HIGH (ref 8–23)
CO2: 28 mmol/L (ref 22–32)
Calcium: 9.6 mg/dL (ref 8.9–10.3)
Chloride: 99 mmol/L (ref 98–111)
Creatinine, Ser: 0.71 mg/dL (ref 0.44–1.00)
GFR, Estimated: >60 mL/min (ref >60)
Glucose, Bld: 178 mg/dL — ABNORMAL HIGH (ref 70–99)
Potassium: 4.4 mmol/L (ref 3.5–5.1)
Sodium: 138 mmol/L (ref 135–145)

## 2021-07-04 LAB — GLUCOSE, CAPILLARY
Glucose-Capillary: 133 mg/dL — ABNORMAL HIGH (ref 70–99)
Glucose-Capillary: 139 mg/dL — ABNORMAL HIGH (ref 70–99)
Glucose-Capillary: 152 mg/dL — ABNORMAL HIGH (ref 70–99)
Glucose-Capillary: 169 mg/dL — ABNORMAL HIGH (ref 70–99)
Glucose-Capillary: 177 mg/dL — ABNORMAL HIGH (ref 70–99)
Glucose-Capillary: 254 mg/dL — ABNORMAL HIGH (ref 70–99)

## 2021-07-04 LAB — APTT: aPTT: 29 seconds (ref 24–36)

## 2021-07-04 LAB — PROTIME-INR
INR: 1.1 (ref 0.8–1.2)
Prothrombin Time: 14.6 seconds (ref 11.4–15.2)

## 2021-07-04 LAB — MAGNESIUM: Magnesium: 2 mg/dL (ref 1.7–2.4)

## 2021-07-04 MED ORDER — SIMVASTATIN 20 MG PO TABS: 40.0000 mg | ORAL_TABLET | Freq: Every day | ORAL | Status: DC

## 2021-07-04 MED ORDER — LISINOPRIL 5 MG PO TABS: 5.0000 mg | ORAL_TABLET | Freq: Every day | ORAL | Status: DC

## 2021-07-04 MED ORDER — LABETALOL HCL 5 MG/ML IV SOLN: 5.0000 mg | INTRAVENOUS | Status: DC | PRN

## 2021-07-04 MED ORDER — GABAPENTIN 600 MG PO TABS
300.0000 mg | ORAL_TABLET | Freq: Every day | ORAL | Status: DC
  Filled 2021-07-04: qty 0.5
  Filled 2021-07-04: qty 1

## 2021-07-04 MED ORDER — METOPROLOL SUCCINATE ER 50 MG PO TB24: 50.0000 mg | ORAL_TABLET | Freq: Every day | ORAL | Status: DC

## 2021-07-04 NOTE — Progress Notes (Signed)
SLP Cancellation Note  Patient Details Name: [name] MRN: [id number] DOB: 10-10-1925   Cancelled treatment:       Reason Eval/Treat Not Completed: Medical issues which prohibited therapy.   SLP consult received and appreciated. Chart review completed. Per chart review, pt with pending Neurosurgery consult. Will defer SLP efforts pending further medical workup.  Cherrie Gauze, M.S., [facility] [phone] ([place])  Quintella Baton 07/04/2021, 10:18 AM

## 2021-07-04 NOTE — Progress Notes (Signed)
Patient arrived to unit via bed, no family present at this time.  Patient with no complaints at this time, vitals stable.  Will continue to monitor.

## 2021-07-04 NOTE — Progress Notes (Signed)
eLink Physician-Brief Progress Note Patient Name: Yolanda Turner DOB: 01/13/1926 MRN: [id number]   Date of Service  07/04/2021  HPI/Events of Note  Traumatic Intracranial Hemorrhage s/p Mechanical fall- CT c/w bilateral IV hemorrhage + SA blood at left temporal lobe.   eICU Interventions   - Goal SBP 130 -150 mm Hg  -prn Labetolol as needed  -nicardipine drip can likely come off  - HOB >= 30 degrees with aspiration precautions - Goal Na>145 w/ follow BMP - Neurosurgery consult pending         Yolanda Turner 07/04/2021, 6:16 AM

## 2021-07-04 NOTE — Progress Notes (Signed)
Attending Progress Note  History: Yolanda Turner is here for a recent history of progressive falls and confusion.  The most recent of which she struck her head resulting in a traumatic subarachnoid hemorrhage which is now layered and given intraventricular blood.  Her neuro exam has been stable since presentation with a GCS of 10 orientation x1.    Physical Exam: Vitals:   07/04/21 1400 07/04/21 1500  BP: (!) 161/75 (!) 159/61  Pulse: 77 80  Resp: 18 17  Temp:    SpO2: 97% 98%    AA Ox 1, mouthing her name.  Unable to follow complex commands.  Does localize to light touch.  Is moving all 4 extremities.  GCS 10 out of 15 this morning.  Stable from last night's examination.  Appears to have improved responsiveness to voice in comparison of last night  Data:  Recent Labs  Lab 07/03/21 1945 07/04/21 0426  NA 136 138  K 4.3 4.4  CL 100 99  CO2 28 28  BUN 25* 25*  CREATININE 0.62 0.71  GLUCOSE 251* 178*  CALCIUM 9.2 9.6   Recent Labs  Lab 07/03/21 1945  AST 22  ALT 18  ALKPHOS 73     Recent Labs  Lab 07/03/21 1709 07/04/21 0426  WBC 8.0 9.5  HGB 14.4 12.6  HCT 43.7 39.3  PLT 208 201   Recent Labs  Lab 07/04/21 0426  APTT 29  INR 1.1         Other tests/results: Repeat head CT stable with layering of intraventricular blood.  Dissolution of left-sided traumatic subarachnoid hemorrhage  Assessment/Plan:  Yolanda Turner is a 88 year old With a very complicated past medical history including MI, CHF, CVA, with progressive falls and confusion.  Her most recent fall caused her to have a head trauma resulting in traumatic subarachnoid hemorrhage.  Her exam from last night to this morning slightly improved with an increased responsiveness.  Her GCS is stable at 10 out of 15.  Attempted again to contact family but was unable to.  At this point would continue to avoid antiseizure prophylaxis.  Does not need scheduled repeat head CT.  Continue neuro exams.  Continue to look  for underlying reason of progressive confusion and falls.  At this point does not meet criteria for acute neurosurgical intervention.  Stevan Born, MD/MSCR Department of Neurosurgery

## 2021-07-04 NOTE — Consult Note (Signed)
ORTHOPAEDIC CONSULTATION  REQUESTING PHYSICIAN: Bennie Pierini, MD  Chief Complaint:   Right shoulder pain  History of Present Illness: Yolanda Turner is a 88 y.o. female With medical history significant for CVA, prior MI, DM, GERD, hypertension, paroxysmal atrial fibrillation, CHF, chronic left foot ulcers, and uterine cancer with renal artery stenosis who has had a 2-3-day history of increasing confusion with a fall prior to the the time of admission yesterday.  Radiographs in the emergency department showed a proximal humerus fracture as well as a small subarachnoid hemorrhage.  Patient was admitted to the ICU for monitoring.  She was placed in a shoulder immobilizer as well.  History reviewed. No pertinent past medical history. History reviewed. No pertinent surgical history. Social History   Socioeconomic History   Marital status: Widowed    Spouse name: Not on file   Number of children: Not on file   Years of education: Not on file   Highest education level: Not on file  Occupational History   Not on file  Tobacco Use   Smoking status: Unknown   Smokeless tobacco: Not on file  Substance and Sexual Activity   Alcohol use: Not on file   Drug use: Not on file   Sexual activity: Not on file  Other Topics Concern   Not on file  Social History Narrative   Not on file   Social Determinants of Health   Financial Resource Strain: Not on file  Food Insecurity: Not on file  Transportation Needs: Not on file  Physical Activity: Not on file  Stress: Not on file  Social Connections: Not on file   History reviewed. No pertinent family history. No Known Allergies Prior to Admission medications   Not on File   Recent Labs    07/03/21 1709 07/03/21 1945 07/04/21 0426  WBC 8.0  --  9.5  HGB 14.4  --  12.6  HCT 43.7  --  39.3  PLT 208  --  201  K  --  4.3 4.4  CL  --  100 99  CO2  --  28 28  BUN  --  25*  25*  CREATININE  --  0.62 0.71  GLUCOSE  --  251* 178*  CALCIUM  --  9.2 9.6  INR  --   --  1.1   DG Elbow Complete Right  Result Date: 07/03/2021 CLINICAL DATA:  Fall EXAM: RIGHT ELBOW - COMPLETE 3+ VIEW COMPARISON:  None. FINDINGS: There is no evidence of fracture, dislocation, or joint effusion. There is no evidence of arthropathy or other focal bone abnormality. Soft tissues are unremarkable. IMPRESSION: Negative. Electronically Signed   By: Donavan Foil M.D.   On: 07/03/2021 17:33   CT Head Wo Contrast  Result Date: 07/03/2021 CLINICAL DATA:  Follow-up known subarachnoid hemorrhage, fall, anticoagulation EXAM: CT HEAD WITHOUT CONTRAST TECHNIQUE: Contiguous axial images were obtained from the base of the skull through the vertex without intravenous contrast. RADIATION DOSE REDUCTION: This exam was performed according to the departmental dose-optimization program which includes automated exposure control, adjustment of the mA and/or kV according to patient size and/or use of iterative reconstruction technique. COMPARISON:  07/03/2021 FINDINGS: Brain: No evidence of acute infarction, hydrocephalus, extra-axial collection or mass lesion/mass effect. Unchanged, layering blood product in the dependent bilateral lateral ventricles (series 2, image 15). Extensive periventricular and deep white matter hypodensity as well as areas of encephalomalacia involving the bilateral MCA territories (series 2, image 22, 18). Previously described left temporal subarachnoid  hemorrhage is not clearly appreciated on current examination (series 4, image 32). Moderate global cerebral volume loss. Vascular: No hyperdense vessel or unexpected calcification. Skull: Normal. Negative for fracture or focal lesion. Sinuses/Orbits: No acute finding. Other: None. IMPRESSION: 1. Unchanged, layering blood product in the dependent bilateral lateral ventricles. 2. Previously described left temporal subarachnoid hemorrhage is not clearly  appreciated on current examination. 3. Advanced small-vessel white matter disease and multifocal encephalomalacia, unchanged. Electronically Signed   By: Delanna Ahmadi M.D.   On: 07/03/2021 20:45   CT Head Wo Contrast  Result Date: 07/03/2021 CLINICAL DATA:  Trauma mechanical fall EXAM: CT HEAD WITHOUT CONTRAST CT CERVICAL SPINE WITHOUT CONTRAST TECHNIQUE: Multidetector CT imaging of the head and cervical spine was performed following the standard protocol without intravenous contrast. Multiplanar CT image reconstructions of the cervical spine were also generated. RADIATION DOSE REDUCTION: This exam was performed according to the departmental dose-optimization program which includes automated exposure control, adjustment of the mA and/or kV according to patient size and/or use of iterative reconstruction technique. COMPARISON:  None. FINDINGS: CT HEAD FINDINGS Brain: No acute territorial infarction or intracranial mass is visualized. Small hematocrit levels in the posterior horns of the lateral ventricles consistent with acute small amount of intraventricular hemorrhage. No definite parenchymal hemorrhage is seen. Trace subarachnoid hemorrhage at the left temporal lobe, coronal series 4 image 36. Advanced atrophy. Extensive white matter hypodensity consistent with chronic small vessel ischemic change. Multifocal encephalomalacia involving left parietal and posterior temporal lobes. Encephalomalacia within the left occipital and right inferior frontal lobes. Small focus of encephalomalacia within the left inferior frontal lobe. Ventricular enlargement, probably due to atrophy. Suspect small chronic infarcts in the right thalamus and left cerebellum. Vascular: No hyperdense vessels.  Carotid vascular calcification Skull: No fracture Sinuses/Orbits: Partial opacification of left ethmoid air cells Other: Negative CT CERVICAL SPINE FINDINGS Alignment: 3 mm anterolisthesis C4 on C5, trace retrolisthesis C5 on C6 and  trace anterolisthesis C7 on T1. Facet alignment within normal limits Skull base and vertebrae: No acute fracture. No primary bone lesion or focal pathologic process. Soft tissues and spinal canal: No prevertebral fluid or swelling. No visible canal hematoma. Disc levels: Multilevel degenerative change with advanced disc space narrowing C5-C6 and C6-C7. Facet degenerative changes at multiple levels with foraminal stenosis Upper chest: Apical fibrosis Other: None IMPRESSION: 1. Small amount of bilateral intraventricular blood with trace amount of subarachnoid blood at the left temporal lobe. No significant mass effect or midline shift. 2. Advanced atrophy and extensive chronic small vessel ischemic changes of the white matter. Multifocal chronic bilateral infarcts. 3. Degenerative changes of the cervical spine.  No fracture is seen Critical Value/emergent results were called by telephone at the time of interpretation on 07/03/2021 at 5:06 pm to provider [facility] , who verbally acknowledged these results. Electronically Signed   By: Donavan Foil M.D.   On: 07/03/2021 17:06   CT Cervical Spine Wo Contrast  Result Date: 07/03/2021 CLINICAL DATA:  Trauma mechanical fall EXAM: CT HEAD WITHOUT CONTRAST CT CERVICAL SPINE WITHOUT CONTRAST TECHNIQUE: Multidetector CT imaging of the head and cervical spine was performed following the standard protocol without intravenous contrast. Multiplanar CT image reconstructions of the cervical spine were also generated. RADIATION DOSE REDUCTION: This exam was performed according to the departmental dose-optimization program which includes automated exposure control, adjustment of the mA and/or kV according to patient size and/or use of iterative reconstruction technique. COMPARISON:  None. FINDINGS: CT HEAD FINDINGS Brain: No acute territorial infarction or  intracranial mass is visualized. Small hematocrit levels in the posterior horns of the lateral ventricles consistent with  acute small amount of intraventricular hemorrhage. No definite parenchymal hemorrhage is seen. Trace subarachnoid hemorrhage at the left temporal lobe, coronal series 4 image 36. Advanced atrophy. Extensive white matter hypodensity consistent with chronic small vessel ischemic change. Multifocal encephalomalacia involving left parietal and posterior temporal lobes. Encephalomalacia within the left occipital and right inferior frontal lobes. Small focus of encephalomalacia within the left inferior frontal lobe. Ventricular enlargement, probably due to atrophy. Suspect small chronic infarcts in the right thalamus and left cerebellum. Vascular: No hyperdense vessels.  Carotid vascular calcification Skull: No fracture Sinuses/Orbits: Partial opacification of left ethmoid air cells Other: Negative CT CERVICAL SPINE FINDINGS Alignment: 3 mm anterolisthesis C4 on C5, trace retrolisthesis C5 on C6 and trace anterolisthesis C7 on T1. Facet alignment within normal limits Skull base and vertebrae: No acute fracture. No primary bone lesion or focal pathologic process. Soft tissues and spinal canal: No prevertebral fluid or swelling. No visible canal hematoma. Disc levels: Multilevel degenerative change with advanced disc space narrowing C5-C6 and C6-C7. Facet degenerative changes at multiple levels with foraminal stenosis Upper chest: Apical fibrosis Other: None IMPRESSION: 1. Small amount of bilateral intraventricular blood with trace amount of subarachnoid blood at the left temporal lobe. No significant mass effect or midline shift. 2. Advanced atrophy and extensive chronic small vessel ischemic changes of the white matter. Multifocal chronic bilateral infarcts. 3. Degenerative changes of the cervical spine.  No fracture is seen Critical Value/emergent results were called by telephone at the time of interpretation on 07/03/2021 at 5:06 pm to provider [facility] , who verbally acknowledged these results. Electronically  Signed   By: Donavan Foil M.D.   On: 07/03/2021 17:06   DG Chest Portable 1 View  Result Date: 07/03/2021 CLINICAL DATA:  Fall EXAM: PORTABLE CHEST 1 VIEW COMPARISON:  None. FINDINGS: Acute mildly displaced proximal humerus fracture on the right. Post sternotomy changes. Mild cardiomegaly. No acute airspace disease, pleural effusion or pneumothorax. Biapical pleuroparenchymal fibrosis. Aortic atherosclerosis. IMPRESSION: 1. No acute airspace disease. Mild cardiomegaly and apical fibrosis and scarring 2. Acute displaced right proximal humerus fracture Electronically Signed   By: Donavan Foil M.D.   On: 07/03/2021 17:34   DG Shoulder Right Port  Result Date: 07/03/2021 CLINICAL DATA:  Fall EXAM: RIGHT SHOULDER - 1 VIEW COMPARISON:  None. FINDINGS: AC joint is intact. Acute fracture involving the proximal shaft of the humerus with close to 1/2 shaft diameter midline displacement of distal fracture fragment. There is mild apex anterior angulation at the site of fracture. IMPRESSION: Acute displaced and mildly angulated fracture involving proximal shaft of the humerus Electronically Signed   By: Donavan Foil M.D.   On: 07/03/2021 17:32     Positive ROS: All other systems have been reviewed and were otherwise negative with the exception of those mentioned in the HPI and as above.  Physical Exam: BP (!) 165/62    Pulse 74    Temp (!) 97.3 F (36.3 C) (Axillary)    Resp 17    Ht 5\' 5"  (1.651 m)    Wt 30.6 kg    SpO2 92%    BMI 11.23 kg/m  General: Drowsy, but arousable.  No acute distress Psychiatric:  Patient does not appear competent for consent at this time.  Nonagitated. Cardiovascular:  No pedal edema, palpable distal pulses Respiratory:  No wheezing, non-labored breathing GI:  Abdomen is soft and non-tender  Skin:  No lesions in the area of chief complaint, no erythema Neurologic:  Sensation intact distally, CN grossly intact Lymphatic:  No axillary or cervical lymphadenopathy  Orthopedic  Exam:  RUE: Moves fingers in response to stimulation SILT grossly over hand  significant tenderness to palpation proximal humerus +rad pulse RoM not tested. Extremity appropriately shoulder in immobilizer   X-rays:  As above: 2 part proximal humerus fracture with fracture line at the metadiaphysis with moderate displacement and angulation  Assessment/Plan: 88 year old female with proximal humerus fracture admitted to the hospital for monitoring of subarachnoid hemorrhage. Recommend nonsurgical management for right proximal humerus fracture. Nonweightbearing on right upper extremity.  Maintain sling immobilization Pain control per primary team. Can follow-up with [facility] clinic orthopedics as an outpatient in approximately 1-2 weeks.  We will plan to follow peripherally while inpatient.  Please page with questions.   Leim Fabry   07/04/2021 12:05 PM

## 2021-07-04 NOTE — Progress Notes (Signed)
*  PRELIMINARY RESULTS* Echocardiogram 2D Echocardiogram has been performed.  Jonette Mate Bridgid Printz 07/04/2021, 3:14 PM

## 2021-07-05 DIAGNOSIS — S42201A Unspecified fracture of upper end of right humerus, initial encounter for closed fracture: Secondary | ICD-10-CM | POA: Diagnosis not present

## 2021-07-05 DIAGNOSIS — T1490XA Injury, unspecified, initial encounter: Secondary | ICD-10-CM | POA: Diagnosis not present

## 2021-07-05 DIAGNOSIS — I609 Nontraumatic subarachnoid hemorrhage, unspecified: Secondary | ICD-10-CM | POA: Diagnosis not present

## 2021-07-05 DIAGNOSIS — S066X0A Traumatic subarachnoid hemorrhage without loss of consciousness, initial encounter: Secondary | ICD-10-CM | POA: Diagnosis not present

## 2021-07-05 DIAGNOSIS — R627 Adult failure to thrive: Secondary | ICD-10-CM

## 2021-07-05 LAB — HEMOGLOBIN A1C
Hgb A1c MFr Bld: 7 % — ABNORMAL HIGH (ref 4.8–5.6)
Mean Plasma Glucose: 154 mg/dL

## 2021-07-05 LAB — ECHOCARDIOGRAM COMPLETE
AR max vel: 1.63 cm2
AV Peak grad: 6.3 mmHg
Ao pk vel: 1.25 m/s
Area-P 1/2: 5.79 cm2
Height: 65 in
S' Lateral: 3.2 cm
Weight: 1079.37 oz

## 2021-07-05 LAB — GLUCOSE, CAPILLARY
Glucose-Capillary: 159 mg/dL — ABNORMAL HIGH (ref 70–99)
Glucose-Capillary: 166 mg/dL — ABNORMAL HIGH (ref 70–99)
Glucose-Capillary: 172 mg/dL — ABNORMAL HIGH (ref 70–99)
Glucose-Capillary: 175 mg/dL — ABNORMAL HIGH (ref 70–99)
Glucose-Capillary: 194 mg/dL — ABNORMAL HIGH (ref 70–99)
Glucose-Capillary: 196 mg/dL — ABNORMAL HIGH (ref 70–99)

## 2021-07-05 NOTE — Evaluation (Signed)
Occupational Therapy Evaluation Patient Details Name: Yolanda Turner MRN: [id number] DOB: May 28, 1926 Today's Date: 07/05/2021   History of Present Illness 88 y.o. female With medical history significant for CVA, prior MI, DM, GERD, hypertension, paroxysmal atrial fibrillation, CHF, chronic left foot ulcers, and uterine cancer with renal artery stenosis who has had a 2-3-day history of increasing confusion with a fall prior to the the time of admission yesterday.  Radiographs in the emergency department showed a proximal humerus fracture as well as a small subarachnoid hemorrhage   Clinical Impression   Patient presenting with decreased Ind in self care, balance, functional mobility/transfers, endurance, and safety awareness. Patient's family arrives this session and reports pt is independent at baseline without use of AD. Pt is very lethargic this session but does grimace and moan with mobility. Pt standing from recliner chair with mod A and taking several steps to bed with mod A. Max A to return to supine. Total A to reposition R shoulder sling. OT educated family in the room about NWB precautions and pt's current level of care. They want to take her home and provide 24/7 supervision with [facility] therapy. Pt is otherwise non-verbal this session. Patient will benefit from acute OT to increase overall independence in the areas of ADLs, functional mobility, and safety awareness in order to safely discharge home with family.      Recommendations for follow up therapy are one component of a multi-disciplinary discharge planning process, led by the attending physician.  Recommendations may be updated based on patient status, additional functional criteria and insurance authorization.   Follow Up Recommendations  Home health OT    Assistance Recommended at Discharge Frequent or constant Supervision/Assistance  Patient can return home with the following A lot of help with walking and/or transfers;A lot of help  with bathing/dressing/bathroom;Assistance with cooking/housework;Assistance with feeding;Help with stairs or ramp for entrance;Assist for transportation;Direct supervision/assist for financial management;Direct supervision/assist for medications management    Functional Status Assessment  Patient has had a recent decline in their functional status and demonstrates the ability to make significant improvements in function in a reasonable and predictable amount of time.  Equipment Recommendations  Other (comment) (wheelchair)       Precautions / Restrictions Precautions Precautions: Fall Required Braces or Orthoses: Sling Restrictions Weight Bearing Restrictions: Yes RUE Weight Bearing: Non weight bearing Other Position/Activity Restrictions: sling at all times      Mobility Bed Mobility Overal bed mobility: Needs Assistance Bed Mobility: Sit to Supine       Sit to supine: Max assist   General bed mobility comments: assist for trunk and B LEs    Transfers Overall transfer level: Needs assistance Equipment used: 1 person hand held assist Transfers: Sit to/from Stand, Bed to chair/wheelchair/BSC Sit to Stand: Mod assist     Step pivot transfers: Mod assist            Balance Overall balance assessment: Needs assistance Sitting-balance support: Feet supported Sitting balance-Leahy Scale: Fair       Standing balance-Leahy Scale: Poor                             ADL either performed or assessed with clinical judgement   ADL  General ADL Comments: total A     Vision Patient Visual Report: No change from baseline              Pertinent Vitals/Pain Pain Assessment Pain Assessment: Faces Faces Pain Scale: Hurts even more Pain Location: R UE Pain Descriptors / Indicators: Aching, Discomfort, Grimacing, Guarding Pain Intervention(s): Limited activity within patient's tolerance, Monitored  during session, Repositioned        Extremity/Trunk Assessment Upper Extremity Assessment Upper Extremity Assessment: RUE deficits/detail;Generalized weakness RUE Deficits / Details: fx- NWB and placed in sling   Lower Extremity Assessment Lower Extremity Assessment: Defer to PT evaluation       Communication Communication Communication: No difficulties   Cognition Arousal/Alertness: Lethargic Behavior During Therapy: Flat affect Overall Cognitive Status: History of cognitive impairments - at baseline                                 General Comments: Pt was nonverbal during session. Family arrived to provide PLOF.                Home Living Family/patient expects to be discharged to:: Private residence Living Arrangements: Children Available Help at Discharge: Family;Available 24 hours/day         Home Layout: One level     Bathroom Shower/Tub: Chief Strategy Officer: Rollator (4 wheels);Cane - single point;BSC/3in1;Shower seat;Hospital bed   Additional Comments: hoyer lift available at home per family report      Prior Functioning/Environment Prior Level of Function : Independent/Modified Independent             Mobility Comments: per family report, prior to ~ 1 week, pt ambulating independently without assistance ADLs Comments: Ind prior to admission        OT Problem List: Decreased strength;Decreased activity tolerance;Impaired balance (sitting and/or standing);Decreased safety awareness;Decreased knowledge of precautions;Impaired UE functional use;Cardiopulmonary status limiting activity      OT Treatment/Interventions: Self-care/ADL training;Therapeutic exercise;DME and/or AE instruction;Manual therapy;Balance training;Patient/family education;Energy conservation;Cognitive remediation/compensation;Neuromuscular education    OT Goals(Current goals can be found in the care plan section) Acute Rehab OT Goals Patient  Stated Goal: to return home OT Goal Formulation: With patient/family Time For Goal Achievement: 07/19/21 Potential to Achieve Goals: Fair  OT Frequency: Min 2X/week       AM-PAC OT "6 Clicks" Daily Activity     Outcome Measure Help from another person eating meals?: A Lot Help from another person taking care of personal grooming?: A Lot Help from another person toileting, which includes using toliet, bedpan, or urinal?: Total Help from another person bathing (including washing, rinsing, drying)?: Total Help from another person to put on and taking off regular upper body clothing?: A Lot Help from another person to put on and taking off regular lower body clothing?: Total 6 Click Score: 9   End of Session Nurse Communication: Mobility status  Activity Tolerance: Patient limited by fatigue;Patient limited by lethargy;Patient limited by pain Patient left: in bed;with call bell/phone within reach;with nursing/sitter in room;with bed alarm set  OT Visit Diagnosis: Unsteadiness on feet (R26.81);Muscle weakness (generalized) (M62.81)                Time: 0300-9233 OT Time Calculation (min): 21 min Charges:  OT General Charges $OT Visit: 1 Visit OT Evaluation $OT Eval Moderate Complexity: 1 Mod OT Treatments $Self Care/Home Management : 8-22 mins  Darleen Crocker,  MS, OTR/L , CBIS ascom [phone]  07/05/21, 12:37 PM

## 2021-07-05 NOTE — Progress Notes (Signed)
SLP Cancellation Note  Patient Details Name: Yolanda Turner MRN: [id number] DOB: 1926-02-24   Cancelled treatment:       Reason Eval/Treat Not Completed: Patient's level of consciousness;Fatigue/lethargy limiting ability to participate   Spoke with RN. RN requesting SLP to hold efforts at this time given reduced LOA and pending Palliative Consult.   SLP to continue efforts as appropriate. RN to contact SLP if pt's LOA improves.    Cherrie Gauze, M.S., [facility] [phone] [place])  Quintella Baton 07/05/2021, 9:06 AM

## 2021-07-05 NOTE — Progress Notes (Addendum)
[facility]) Hospital Liaison Note  Received request from Transitions of Care Manager Dreama Saa, RN, for hospice services at home after discharge. Chart and patient information reviewed by [facility] physician. Hospice eligibility confirmed.  Spoke with granddaughter [name] and Carrie's husband to initiate education related to hospice philosophy, services and team approach to care. Patient/family verbalized understanding of information provided.   DME needs discussed. Patient has the following equipment in the home: rolling walker, cane, BSC. Patient/family requests the following equipment for delivery: wheelchair. Address has been verified and is correct in the chart. [name] is the family contact to arrange time of equipment delivery.   Please provide prescriptions at discharge as needed to ensure ongoing symptom management.   [facility] information and contact numbers given to family. Above information shared with [place].   Please do not hesitate to call with any hospice related questions or concerns.   Thank you for the opportunity to participate in this patient's care.   Nadene Rubins, RN, BSN [facility] Liaison [phone]

## 2021-07-05 NOTE — Evaluation (Signed)
Physical Therapy Evaluation Patient Details Name: Yolanda Turner MRN: [id number] DOB: 23-Apr-1926 Today's Date: 07/05/2021  History of Present Illness  Pt is a 88 y/o F admitted on 07/03/21 after presenting to the ED with c/c of a fall. Pt was seen by his PCP the day prior for AMS but was negative for UTI. CTH showed small amount of bilateral IVH blood with trace amount of SAH at the left temporal lobe.  Xray of the right elbow showed Acute displaced and mildly angulated fracture involving proximal shaft of the humerus. PMH: CVA, MI, DM, GERD, HTN, paroxysmal a-fib, CHF, chronic L foot ulcers, uterina CA, renal artery stenosis  Clinical Impression  Pt seen for PT evaluation with pt received in bed without any family/friends present. Pt does not verbalize her name and doesn't seem very alert. Pt does moan & grimace when PT facilitates mobilization. Pt requires total assist for bed mobility & max assist for transfer sit>stand & to recliner. Pt positioned comfortably & left with all needs in reach.        Recommendations for follow up therapy are one component of a multi-disciplinary discharge planning process, led by the attending physician.  Recommendations may be updated based on patient status, additional functional criteria and insurance authorization.  Follow Up Recommendations Skilled nursing-short term rehab (<3 hours/day)    Assistance Recommended at Discharge Frequent or constant Supervision/Assistance  Patient can return home with the following  A lot of help with walking and/or transfers;Assistance with feeding;Assist for transportation;A lot of help with bathing/dressing/bathroom;Direct supervision/assist for medications management;Help with stairs or ramp for entrance;Direct supervision/assist for financial management;Assistance with cooking/housework    Equipment Recommendations None recommended by PT  Recommendations for Other Services       Functional Status Assessment Patient has  had a recent decline in their functional status and demonstrates the ability to make significant improvements in function in a reasonable and predictable amount of time.     Precautions / Restrictions Precautions Precautions: Fall Required Braces or Orthoses: Sling Restrictions Weight Bearing Restrictions: Yes RUE Weight Bearing: Non weight bearing Other Position/Activity Restrictions: sling at all times      Mobility  Bed Mobility Overal bed mobility: Needs Assistance Bed Mobility: Supine to Sit       Sit to supine: Total assist, HOB elevated        Transfers Overall transfer level: Needs assistance Equipment used: 1 person hand held assist Transfers: Sit to/from Stand, Bed to chair/wheelchair/BSC Sit to Stand: Max assist, Total assist   Step pivot transfers: Max assist       General transfer comment: PT provides manual facilitation to initiate sit>stand, LUE support.    Ambulation/Gait                  Stairs            Wheelchair Mobility    Modified Rankin (Stroke Patients Only)       Balance Overall balance assessment: Needs assistance Sitting-balance support: Feet supported Sitting balance-Leahy Scale: Poor     Standing balance support: Single extremity supported, During functional activity Standing balance-Leahy Scale: Poor                               Pertinent Vitals/Pain Pain Assessment Pain Assessment: Faces Faces Pain Scale: Hurts even more Pain Location: generalized, RUE Pain Descriptors / Indicators: Aching, Discomfort, Grimacing, Guarding Pain Intervention(s): Monitored during session, Repositioned    Home  Living Family/patient expects to be discharged to:: Private residence Living Arrangements: Children Available Help at Discharge: Family;Available 24 hours/day           Home Layout: One level Home Equipment: Rollator (4 wheels);Cane - single point;BSC/3in1;Shower seat;Hospital bed Additional  Comments: hoyer lift available at home per family report    Prior Function Prior Level of Function : Independent/Modified Independent             Mobility Comments: per family report, prior to ~ 1 week, pt ambulating independently without assistance ADLs Comments: Ind prior to admission     Hand Dominance        Extremity/Trunk Assessment   Upper Extremity Assessment Upper Extremity Assessment: RUE deficits/detail RUE Deficits / Details: fx- NWB and placed in sling    Lower Extremity Assessment Lower Extremity Assessment: Generalized weakness;Difficult to assess due to impaired cognition    Cervical / Trunk Assessment Cervical / Trunk Assessment: Kyphotic (rounded shoulders, forward head)  Communication   Communication: No difficulties  Cognition Arousal/Alertness: Lethargic Behavior During Therapy: Flat affect Overall Cognitive Status: History of cognitive impairments - at baseline                                 General Comments: Grimaced, moaned at times. Did state "wait, wait" when PT facilitates mobility.        General Comments      Exercises     Assessment/Plan    PT Assessment Patient needs continued PT services  PT Problem List Decreased strength;Decreased mobility;Decreased safety awareness;Decreased activity tolerance;Cardiopulmonary status limiting activity;Decreased balance;Decreased knowledge of use of DME;Pain;Decreased cognition       PT Treatment Interventions DME instruction;Therapeutic exercise;Gait training;Balance training;Stair training;Neuromuscular re-education;Functional mobility training;Cognitive remediation;Therapeutic activities;Patient/family education    PT Goals (Current goals can be found in the Care Plan section)  Acute Rehab PT Goals Patient Stated Goal: none stated PT Goal Formulation: Patient unable to participate in goal setting Time For Goal Achievement: 07/19/21 Potential to Achieve Goals: Fair     Frequency Min 2X/week     Co-evaluation               AM-PAC PT "6 Clicks" Mobility  Outcome Measure Help needed turning from your back to your side while in a flat bed without using bedrails?: A Lot Help needed moving from lying on your back to sitting on the side of a flat bed without using bedrails?: Total Help needed moving to and from a bed to a chair (including a wheelchair)?: Total Help needed standing up from a chair using your arms (e.g., wheelchair or bedside chair)?: Total Help needed to walk in hospital room?: Total Help needed climbing 3-5 steps with a railing? : Total 6 Click Score: 7    End of Session Equipment Utilized During Treatment:  (RUE sling) Activity Tolerance: Patient tolerated treatment well;Patient limited by fatigue Patient left: in chair;with chair alarm set;with call bell/phone within reach Nurse Communication: Mobility status PT Visit Diagnosis: Unsteadiness on feet (R26.81);Difficulty in walking, not elsewhere classified (R26.2);Muscle weakness (generalized) (M62.81)    Time: 4235-3614 PT Time Calculation (min) (ACUTE ONLY): 10 min   Charges:   PT Evaluation $PT Eval Moderate Complexity: Granger, PT, DPT 07/05/21, 2:11 PM   Waunita Schooner 07/05/2021, 2:08 PM

## 2021-07-05 NOTE — Progress Notes (Addendum)
PROGRESS NOTE  Yolanda Turner    DOB: 03-26-1926, 88 y.o.  VXB:[id number]  PCP: Albina Billet, MD   Code Status: Full Code   DOA: 07/03/2021   LOS: 2  Brief Narrative of Current Hospitalization  Yolanda Turner is a 88 y.o. female with a PMH significant for CVA, CAD s/p MI, DM, GERD, hypertension, small bowel obstruction, paroxysmal atrial fibrillation, chronic systolic HF, left foot ulcers, uterine cancer in renal artery stenosis. They presented from home to the ED on 07/03/2021 with witnessed fall. Family states that she had abnormal mentation and ambulation for the past week including walking sideways. In the ED, it was found that they had bilateral intraventricular blood with trace amount of subarachnoid hemorrhage in the left temporal lobe on head CT.  Neurosurgery was consulted and after verifying that bleeding was stable, recommended nonoperative management. Additionally, patient was found to have right humeral fracture.  Ortho surgery was consulted and recommended nonsurgical management. Patient is severely cachectic.  Family states that she is almost nonverbal at baseline and receives significant amount of support at home from family members. Had a discussion with patient's granddaughter and son-in-law as well as her pastor and his wife about goals of care.  They wish to bring her home and proceed with hospice care. Patient was admitted to medicine service for further workup and management of failure to thrive as outlined in detail below.  07/05/21 -[facility] assuming care of patient from PCCM today. Do not see a progress note from attending 1/22. Patient appears to be stable today. Spoke with family at bedside for dispo planning and they elected to proceed with home hospice.   Assessment & Plan  Principal Problem:   Subarachnoid hemorrhage (HCC)  Bilateral intraventricular blood with trace amount of subarachnoid hemorrhage in the left temporal lobe- stable.  Given very poor baseline health,  would not suspect patient to make significant improvements in her mentation or functionality.  She is currently alert but unable to converse or follow commands. -Neurosurgery recommended nonoperative management as the bleeding is stable. -Discontinued PT/OT as patient is not able to participate. -Consulted hospice care, per patient's family request to bring her home with hospice  Acute R arm fracture- in sling. Patient unable to express if she is in pain but does not appear to be in distress. She is unable to take PO pain medications.  - monitor for signs of distress.   HFpEF   h/o CAD, MI   HLD   Afib- chronic - holding anticoagulation in setting of bleed - holding antihypertensives in setting of  NPO  Type II DM- avoid tight glucose control given advanced age and no PO intake - monitor for hypoglycemia  Adult failure to thrive- patient BMI 11 and unable to take PO. Had discussion with family about end of life wishes and she has a lot of support at home so they prefer to take home with hospice. Need to reassess code status.  DVT prophylaxis: none- active bleed  Diet:  Diet Orders (From admission, onward)     Start     Ordered   07/04/21 1829  Diet NPO time specified Except for: [employer], Sips with Meds  Diet effective now       Question Answer Comment  Except for Ice Chips   Except for Sips with Meds      07/04/21 1828            Subjective 07/05/21    Pt appears to mouth a word  in response to conversation but unable to verbalize anything. NAD  Disposition Plan & Communication  Patient status: Inpatient  Admitted From: Home Disposition: Hospice care home Anticipated discharge date: 1/24  Family Communication: granddaughter and her husband, Theme park manager and his wife. All at bedside  Consults, Procedures, Significant Events  Consultants:  Hospice Neuro surgery Ortho surgery PCCM  Procedures/significant events:  None  Antimicrobials:  Anti-infectives (From admission,  onward)    None       Objective   Vitals:   07/04/21 1818 07/04/21 2142 07/05/21 0016 07/05/21 0536  BP: (!) 161/73 (!) 177/90 (!) 176/84 (!) 179/91  Pulse: 88 92 89 90  Resp: 20 18 18 20   Temp: 97.7 F (36.5 C) 98 F (36.7 C) 98 F (36.7 C) 97.8 F (36.6 C)  TempSrc: Oral Oral  Oral  SpO2: 97% 95% 96% 97%  Weight:      Height:        Intake/Output Summary (Last 24 hours) at 07/05/2021 3235 Last data filed at 07/04/2021 1653 Gross per 24 hour  Intake --  Output 200 ml  Net -200 ml   Filed Weights   07/03/21 2100  Weight: 30.6 kg    Patient BMI: Body mass index is 11.23 kg/m.   Physical Exam:  General: awake, alert, NAD cachectic  HEENT: dry mucous membranes Respiratory: normal respiratory effort. Cardiovascular: quick capillary refill  Nervous: Alert, non-verbal Extremities: extreme muscle wasting Skin: dry, intact, normal temperature, normal color. No rashes, lesions or ulcers on exposed skin  Labs   I have personally reviewed following labs and imaging studies Admission on 07/03/2021  Component Date Value Ref Range Status   WBC 07/03/2021 8.0  4.0 - 10.5 K/uL Final   RBC 07/03/2021 4.93  3.87 - 5.11 MIL/uL Final   Hemoglobin 07/03/2021 14.4  12.0 - 15.0 g/dL Final   HCT 07/03/2021 43.7  36.0 - 46.0 % Final   MCV 07/03/2021 88.6  80.0 - 100.0 fL Final   MCH 07/03/2021 29.2  26.0 - 34.0 pg Final   MCHC 07/03/2021 33.0  30.0 - 36.0 g/dL Final   RDW 07/03/2021 13.2  11.5 - 15.5 % Final   Platelets 07/03/2021 208  150 - 400 K/uL Final   nRBC 07/03/2021 0.0  0.0 - 0.2 % Final   Neutrophils Relative % 07/03/2021 92  % Final   Neutro Abs 07/03/2021 7.4  1.7 - 7.7 K/uL Final   Lymphocytes Relative 07/03/2021 4  % Final   Lymphs Abs 07/03/2021 0.3 (L)  0.7 - 4.0 K/uL Final   Monocytes Relative 07/03/2021 3  % Final   Monocytes Absolute 07/03/2021 0.2  0.1 - 1.0 K/uL Final   Eosinophils Relative 07/03/2021 0  % Final   Eosinophils Absolute 07/03/2021 0.0  0.0 -  0.5 K/uL Final   Basophils Relative 07/03/2021 0  % Final   Basophils Absolute 07/03/2021 0.0  0.0 - 0.1 K/uL Final   Immature Granulocytes 07/03/2021 1  % Final   Abs Immature Granulocytes 07/03/2021 0.05  0.00 - 0.07 K/uL Final   Troponin I (High Sensitivity) 07/03/2021 25 (H)  <18 ng/L Final   SARS Coronavirus 2 by RT PCR 07/03/2021 NEGATIVE  NEGATIVE Final   Influenza A by PCR 07/03/2021 NEGATIVE  NEGATIVE Final   Influenza B by PCR 07/03/2021 NEGATIVE  NEGATIVE Final   Sodium 07/03/2021 136  135 - 145 mmol/L Final   Potassium 07/03/2021 4.3  3.5 - 5.1 mmol/L Final   Chloride 07/03/2021 100  98 - 111 mmol/L Final   CO2 07/03/2021 28  22 - 32 mmol/L Final   Glucose, Bld 07/03/2021 251 (H)  70 - 99 mg/dL Final   BUN 07/03/2021 25 (H)  8 - 23 mg/dL Final   Creatinine, Ser 07/03/2021 0.62  0.44 - 1.00 mg/dL Final   Calcium 07/03/2021 9.2  8.9 - 10.3 mg/dL Final   Total Protein 07/03/2021 6.1 (L)  6.5 - 8.1 g/dL Final   Albumin 07/03/2021 3.7  3.5 - 5.0 g/dL Final   AST 07/03/2021 22  15 - 41 U/L Final   ALT 07/03/2021 18  0 - 44 U/L Final   Alkaline Phosphatase 07/03/2021 73  38 - 126 U/L Final   Total Bilirubin 07/03/2021 0.5  0.3 - 1.2 mg/dL Final   GFR, Estimated 07/03/2021 >60  >60 mL/min Final   Anion gap 07/03/2021 8  5 - 15 Final   Magnesium 07/03/2021 1.9  1.7 - 2.4 mg/dL Final   ABO/RH(D) 07/03/2021 A POS   Final   Antibody Screen 07/03/2021 NEG   Final   Sample Expiration 07/03/2021    Final                   Value:07/06/2021,2359 Performed at [facility], [address] [place], [place] [address]    Troponin I (High Sensitivity) 07/03/2021 24 (H)  <18 ng/L Final   B Natriuretic Peptide 07/03/2021 1,910.7 (H)  0.0 - 100.0 pg/mL Final   Specimen Description 07/03/2021 BLOOD RIGHT ASSIST CONTROL   Final   Special Requests 07/03/2021 BOTTLES DRAWN AEROBIC AND ANAEROBIC Blood Culture adequate volume   Final   Culture 07/03/2021    Final                    Value:NO GROWTH < 12 HOURS Performed at [facility], [address] [place], [place] [address]    Report Status 07/03/2021 PENDING   Incomplete   Specimen Description 07/03/2021 BLOOD LEFT ASSIST CONTROL   Final   Special Requests 07/03/2021 BOTTLES DRAWN AEROBIC AND ANAEROBIC Blood Culture adequate volume   Final   Culture 07/03/2021    Final                   Value:NO GROWTH < 12 HOURS Performed at [facility], [address] [place], [place] [address]    Report Status 07/03/2021 PENDING   Incomplete   Lactic Acid, Venous 07/03/2021 1.3  0.5 - 1.9 mmol/L Final   aPTT 07/04/2021 29  24 - 36 seconds Final   Prothrombin Time 07/04/2021 14.6  11.4 - 15.2 seconds Final   INR 07/04/2021 1.1  0.8 - 1.2 Final   Sodium 07/04/2021 138  135 - 145 mmol/L Final   Potassium 07/04/2021 4.4  3.5 - 5.1 mmol/L Final   Chloride 07/04/2021 99  98 - 111 mmol/L Final   CO2 07/04/2021 28  22 - 32 mmol/L Final   Glucose, Bld 07/04/2021 178 (H)  70 - 99 mg/dL Final   BUN 07/04/2021 25 (H)  8 - 23 mg/dL Final   Creatinine, Ser 07/04/2021 0.71  0.44 - 1.00 mg/dL Final   Calcium 07/04/2021 9.6  8.9 - 10.3 mg/dL Final   GFR, Estimated 07/04/2021 >60  >60 mL/min Final   Anion gap 07/04/2021 11  5 - 15 Final   Magnesium 07/04/2021 2.0  1.7 - 2.4 mg/dL Final   WBC 07/04/2021 9.5  4.0 - 10.5 K/uL Final  RBC 07/04/2021 4.32  3.87 - 5.11 MIL/uL Final   Hemoglobin 07/04/2021 12.6  12.0 - 15.0 g/dL Final   HCT 07/04/2021 39.3  36.0 - 46.0 % Final   MCV 07/04/2021 91.0  80.0 - 100.0 fL Final   MCH 07/04/2021 29.2  26.0 - 34.0 pg Final   MCHC 07/04/2021 32.1  30.0 - 36.0 g/dL Final   RDW 07/04/2021 13.3  11.5 - 15.5 % Final   Platelets 07/04/2021 201  150 - 400 K/uL Final   nRBC 07/04/2021 0.0  0.0 - 0.2 % Final   Neutrophils Relative % 07/04/2021 92  % Final   Neutro Abs 07/04/2021 8.7 (H)  1.7 - 7.7 K/uL Final   Lymphocytes Relative 07/04/2021 2  % Final   Lymphs Abs 07/04/2021 0.2 (L)   0.7 - 4.0 K/uL Final   Monocytes Relative 07/04/2021 6  % Final   Monocytes Absolute 07/04/2021 0.5  0.1 - 1.0 K/uL Final   Eosinophils Relative 07/04/2021 0  % Final   Eosinophils Absolute 07/04/2021 0.0  0.0 - 0.5 K/uL Final   Basophils Relative 07/04/2021 0  % Final   Basophils Absolute 07/04/2021 0.0  0.0 - 0.1 K/uL Final   Immature Granulocytes 07/04/2021 0  % Final   Abs Immature Granulocytes 07/04/2021 0.04  0.00 - 0.07 K/uL Final   Weight 07/04/2021 1,079.37  oz In process   Height 07/04/2021 65  in In process   BP 07/04/2021 119/51  mmHg In process   Glucose-Capillary 07/04/2021 254 (H)  70 - 99 mg/dL Final   MRSA by PCR Next Gen 07/03/2021 NOT DETECTED  NOT DETECTED Final   Glucose-Capillary 07/04/2021 169 (H)  70 - 99 mg/dL Final   Glucose-Capillary 07/04/2021 133 (H)  70 - 99 mg/dL Final   Glucose-Capillary 07/04/2021 152 (H)  70 - 99 mg/dL Final   Glucose-Capillary 07/04/2021 139 (H)  70 - 99 mg/dL Final   Glucose-Capillary 07/04/2021 177 (H)  70 - 99 mg/dL Final   Glucose-Capillary 07/05/2021 196 (H)  70 - 99 mg/dL Final   Glucose-Capillary 07/05/2021 166 (H)  70 - 99 mg/dL Final    Imaging Studies  DG Elbow Complete Right  Result Date: 07/03/2021 CLINICAL DATA:  Fall EXAM: RIGHT ELBOW - COMPLETE 3+ VIEW COMPARISON:  None. FINDINGS: There is no evidence of fracture, dislocation, or joint effusion. There is no evidence of arthropathy or other focal bone abnormality. Soft tissues are unremarkable. IMPRESSION: Negative. Electronically Signed   By: Donavan Foil M.D.   On: 07/03/2021 17:33   CT Head Wo Contrast  Result Date: 07/03/2021 CLINICAL DATA:  Follow-up known subarachnoid hemorrhage, fall, anticoagulation EXAM: CT HEAD WITHOUT CONTRAST TECHNIQUE: Contiguous axial images were obtained from the base of the skull through the vertex without intravenous contrast. RADIATION DOSE REDUCTION: This exam was performed according to the departmental dose-optimization program which  includes automated exposure control, adjustment of the mA and/or kV according to patient size and/or use of iterative reconstruction technique. COMPARISON:  07/03/2021 FINDINGS: Brain: No evidence of acute infarction, hydrocephalus, extra-axial collection or mass lesion/mass effect. Unchanged, layering blood product in the dependent bilateral lateral ventricles (series 2, image 15). Extensive periventricular and deep white matter hypodensity as well as areas of encephalomalacia involving the bilateral MCA territories (series 2, image 22, 18). Previously described left temporal subarachnoid hemorrhage is not clearly appreciated on current examination (series 4, image 32). Moderate global cerebral volume loss. Vascular: No hyperdense vessel or unexpected calcification. Skull: Normal. Negative for fracture  or focal lesion. Sinuses/Orbits: No acute finding. Other: None. IMPRESSION: 1. Unchanged, layering blood product in the dependent bilateral lateral ventricles. 2. Previously described left temporal subarachnoid hemorrhage is not clearly appreciated on current examination. 3. Advanced small-vessel white matter disease and multifocal encephalomalacia, unchanged. Electronically Signed   By: Delanna Ahmadi M.D.   On: 07/03/2021 20:45   CT Head Wo Contrast  Result Date: 07/03/2021 CLINICAL DATA:  Trauma mechanical fall EXAM: CT HEAD WITHOUT CONTRAST CT CERVICAL SPINE WITHOUT CONTRAST TECHNIQUE: Multidetector CT imaging of the head and cervical spine was performed following the standard protocol without intravenous contrast. Multiplanar CT image reconstructions of the cervical spine were also generated. RADIATION DOSE REDUCTION: This exam was performed according to the departmental dose-optimization program which includes automated exposure control, adjustment of the mA and/or kV according to patient size and/or use of iterative reconstruction technique. COMPARISON:  None. FINDINGS: CT HEAD FINDINGS Brain: No acute  territorial infarction or intracranial mass is visualized. Small hematocrit levels in the posterior horns of the lateral ventricles consistent with acute small amount of intraventricular hemorrhage. No definite parenchymal hemorrhage is seen. Trace subarachnoid hemorrhage at the left temporal lobe, coronal series 4 image 36. Advanced atrophy. Extensive white matter hypodensity consistent with chronic small vessel ischemic change. Multifocal encephalomalacia involving left parietal and posterior temporal lobes. Encephalomalacia within the left occipital and right inferior frontal lobes. Small focus of encephalomalacia within the left inferior frontal lobe. Ventricular enlargement, probably due to atrophy. Suspect small chronic infarcts in the right thalamus and left cerebellum. Vascular: No hyperdense vessels.  Carotid vascular calcification Skull: No fracture Sinuses/Orbits: Partial opacification of left ethmoid air cells Other: Negative CT CERVICAL SPINE FINDINGS Alignment: 3 mm anterolisthesis C4 on C5, trace retrolisthesis C5 on C6 and trace anterolisthesis C7 on T1. Facet alignment within normal limits Skull base and vertebrae: No acute fracture. No primary bone lesion or focal pathologic process. Soft tissues and spinal canal: No prevertebral fluid or swelling. No visible canal hematoma. Disc levels: Multilevel degenerative change with advanced disc space narrowing C5-C6 and C6-C7. Facet degenerative changes at multiple levels with foraminal stenosis Upper chest: Apical fibrosis Other: None IMPRESSION: 1. Small amount of bilateral intraventricular blood with trace amount of subarachnoid blood at the left temporal lobe. No significant mass effect or midline shift. 2. Advanced atrophy and extensive chronic small vessel ischemic changes of the white matter. Multifocal chronic bilateral infarcts. 3. Degenerative changes of the cervical spine.  No fracture is seen Critical Value/emergent results were called by  telephone at the time of interpretation on 07/03/2021 at 5:06 pm to provider [facility] , who verbally acknowledged these results. Electronically Signed   By: Donavan Foil M.D.   On: 07/03/2021 17:06   CT Cervical Spine Wo Contrast  Result Date: 07/03/2021 CLINICAL DATA:  Trauma mechanical fall EXAM: CT HEAD WITHOUT CONTRAST CT CERVICAL SPINE WITHOUT CONTRAST TECHNIQUE: Multidetector CT imaging of the head and cervical spine was performed following the standard protocol without intravenous contrast. Multiplanar CT image reconstructions of the cervical spine were also generated. RADIATION DOSE REDUCTION: This exam was performed according to the departmental dose-optimization program which includes automated exposure control, adjustment of the mA and/or kV according to patient size and/or use of iterative reconstruction technique. COMPARISON:  None. FINDINGS: CT HEAD FINDINGS Brain: No acute territorial infarction or intracranial mass is visualized. Small hematocrit levels in the posterior horns of the lateral ventricles consistent with acute small amount of intraventricular hemorrhage. No definite parenchymal hemorrhage is seen.  Trace subarachnoid hemorrhage at the left temporal lobe, coronal series 4 image 36. Advanced atrophy. Extensive white matter hypodensity consistent with chronic small vessel ischemic change. Multifocal encephalomalacia involving left parietal and posterior temporal lobes. Encephalomalacia within the left occipital and right inferior frontal lobes. Small focus of encephalomalacia within the left inferior frontal lobe. Ventricular enlargement, probably due to atrophy. Suspect small chronic infarcts in the right thalamus and left cerebellum. Vascular: No hyperdense vessels.  Carotid vascular calcification Skull: No fracture Sinuses/Orbits: Partial opacification of left ethmoid air cells Other: Negative CT CERVICAL SPINE FINDINGS Alignment: 3 mm anterolisthesis C4 on C5, trace  retrolisthesis C5 on C6 and trace anterolisthesis C7 on T1. Facet alignment within normal limits Skull base and vertebrae: No acute fracture. No primary bone lesion or focal pathologic process. Soft tissues and spinal canal: No prevertebral fluid or swelling. No visible canal hematoma. Disc levels: Multilevel degenerative change with advanced disc space narrowing C5-C6 and C6-C7. Facet degenerative changes at multiple levels with foraminal stenosis Upper chest: Apical fibrosis Other: None IMPRESSION: 1. Small amount of bilateral intraventricular blood with trace amount of subarachnoid blood at the left temporal lobe. No significant mass effect or midline shift. 2. Advanced atrophy and extensive chronic small vessel ischemic changes of the white matter. Multifocal chronic bilateral infarcts. 3. Degenerative changes of the cervical spine.  No fracture is seen Critical Value/emergent results were called by telephone at the time of interpretation on 07/03/2021 at 5:06 pm to provider [facility] , who verbally acknowledged these results. Electronically Signed   By: Donavan Foil M.D.   On: 07/03/2021 17:06   DG Chest Portable 1 View  Result Date: 07/03/2021 CLINICAL DATA:  Fall EXAM: PORTABLE CHEST 1 VIEW COMPARISON:  None. FINDINGS: Acute mildly displaced proximal humerus fracture on the right. Post sternotomy changes. Mild cardiomegaly. No acute airspace disease, pleural effusion or pneumothorax. Biapical pleuroparenchymal fibrosis. Aortic atherosclerosis. IMPRESSION: 1. No acute airspace disease. Mild cardiomegaly and apical fibrosis and scarring 2. Acute displaced right proximal humerus fracture Electronically Signed   By: Donavan Foil M.D.   On: 07/03/2021 17:34   DG Shoulder Right Port  Result Date: 07/03/2021 CLINICAL DATA:  Fall EXAM: RIGHT SHOULDER - 1 VIEW COMPARISON:  None. FINDINGS: AC joint is intact. Acute fracture involving the proximal shaft of the humerus with close to 1/2 shaft diameter  midline displacement of distal fracture fragment. There is mild apex anterior angulation at the site of fracture. IMPRESSION: Acute displaced and mildly angulated fracture involving proximal shaft of the humerus Electronically Signed   By: Donavan Foil M.D.   On: 07/03/2021 17:32    Medications   Scheduled Meds:  gabapentin  300 mg Oral QHS   insulin aspart  0-9 Units Subcutaneous Q4H   lisinopril  5 mg Oral Daily   metoprolol succinate  50 mg Oral Daily   senna-docusate  1 tablet Oral BID   simvastatin  40 mg Oral q1800   No recently discontinued medications to reconcile  LOS: 2 days   Richarda Osmond, DO [facility] 07/05/2021, 7:22 AM   Available by Epic secure chat 7AM-7PM. If 7PM-7AM, please contact night-coverage Refer to [email or web address] to contact the [facility] Attending or Consulting provider for this pt

## 2021-07-05 NOTE — Progress Notes (Signed)
OT Cancellation Note  Patient Details Name: Yolanda Turner MRN: [id number] DOB: [birth year]   Cancelled Treatment:      MD reports that family has decided to take pt home with hospice and that therapy orders can be completed at this time. OT to SIGN OFF.   Darleen Crocker, MS, OTR/L , CBIS ascom [phone]  07/05/21, 12:39 PM

## 2021-07-05 NOTE — TOC Progression Note (Signed)
Transition of Care [facility]) - Progression Note    Patient Details  Name: Yolanda Turner MRN: [id number] Date of Birth: 1926/04/29  [facility]) CM/SW [place], RN Phone Number: 07/05/2021, 1:14 PM  Clinical Narrative:   Patient's family left hospital, spoke to them via phone and they would like hospice.  They are coming back to the hospital and have chosen [facility].  Sonia Baller at [employer].  TOC to follow.         Expected Discharge Plan and Services                                                 Social Determinants of Health (SDOH) Interventions    Readmission Risk Interventions No flowsheet data found.

## 2021-07-06 ENCOUNTER — Encounter (HOSPITAL_COMMUNITY): Payer: Self-pay | Admitting: Surgery

## 2021-07-06 DIAGNOSIS — Z515 Encounter for palliative care: Secondary | ICD-10-CM

## 2021-07-06 DIAGNOSIS — R627 Adult failure to thrive: Secondary | ICD-10-CM | POA: Diagnosis not present

## 2021-07-06 DIAGNOSIS — I609 Nontraumatic subarachnoid hemorrhage, unspecified: Secondary | ICD-10-CM | POA: Diagnosis not present

## 2021-07-06 DIAGNOSIS — S066X0A Traumatic subarachnoid hemorrhage without loss of consciousness, initial encounter: Secondary | ICD-10-CM | POA: Diagnosis not present

## 2021-07-06 DIAGNOSIS — S42201A Unspecified fracture of upper end of right humerus, initial encounter for closed fracture: Secondary | ICD-10-CM | POA: Diagnosis not present

## 2021-07-06 LAB — GLUCOSE, CAPILLARY
Glucose-Capillary: 159 mg/dL — ABNORMAL HIGH (ref 70–99)
Glucose-Capillary: 172 mg/dL — ABNORMAL HIGH (ref 70–99)

## 2021-07-06 MED ORDER — MORPHINE SULFATE 20 MG/5ML PO SOLN: 2.3000 mg | ORAL | 0 refills | Status: AC | PRN

## 2021-07-06 NOTE — Progress Notes (Signed)
Patient discharged home with hospice via EMS in stable condition. Grand-daughter at bedside. Discharge packet provided.

## 2021-07-06 NOTE — Progress Notes (Signed)
SLP Cancellation Note  Patient Details Name: Yolanda Turner MRN: [id number] DOB: February 24, 1926   Cancelled treatment:       Reason Eval/Treat Not Completed:  (chrt reviewed; consulted NSG). Per chart review and NSG consult, family has decided to take pt home with Hospice services. NSG stated Therapy orders can be completed at this time. ST will sign off w/ MD to reconsult if any new needs arise while admitted.     Orinda Kenner, MS, CCC-SLP Speech Language Pathologist Rehab Services; [place] [phone] (ascom) Bellamy Rubey 07/06/2021, 10:10 AM

## 2021-07-06 NOTE — Progress Notes (Signed)
Patient remains FULL CODE in computer; however, DNR form has been signed and placed on patient chart. DNR band placed on patient.

## 2021-07-06 NOTE — Progress Notes (Signed)
[facility]) Hospital Liaison Note  Patient to discharge home with hospice services. Transport set up with [employer]. Family currently at patient's home awaiting patient's arrival.   Please send signed and completed DNR home with patient/family. Please provide prescriptions at discharge as needed to ensure ongoing symptom management.   [facility] information and contact numbers given to family. Above information shared with [place].   Please do not hesitate to call with any hospice related questions or concerns.   Thank you,  Nadene Rubins, RN, BSN [facility] Liaison [phone]

## 2021-07-06 NOTE — Discharge Summary (Signed)
Physician Discharge Summary  Yolanda Turner MWU:[id number] DOB: 1926-04-26 DOA: 07/03/2021  PCP: Albina Billet, MD  Admit date: 07/03/2021 Discharge date: 07/06/2021  Admitted From: Home Disposition: Hospice care at home  Recommendations for Outpatient Follow-up:  Follow up with ortho in about 2 weeks to monitor R humerus fracture Follow up with hospice on same day to continue pain control PRN  Home hospice to follow up on same day Equipment/Devices:wheelchair  Discharge Condition:stable CODE STATUS:  Code Status: DNR  Comfort feeds as tolerated  Brief/Interim Summary: Pt presented to ED after reported witnessed fall at home. Family stated that she had been walking abnormally the week prior to the fall. She did not converse much at baseline. On admission, it was found that patient had a Right humerus fracture and a bilateral intraventricular and subarachnoid hemorrhage.   Humerus fracture- evaluated by ortho and recommended non-operative management. Arm placed in sling. Patient denied pain at time of discharge.  Cerebral hemorrhage- neuro surgery evaluated serial imaging and assessed to be stable. Recommended non-operative management. Patient was non-ambulatory and non-verbal throughout her admission. PT/OT/SLP attempted to evaluate but patient was unable to participate.   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 Family elected to move forward with home hospice. She was accepted to hospice program and discharged in comfortable and stable condition.   Discharge Diagnoses:  Principal Problem:   Subarachnoid hemorrhage ([place]) Active Problems:   Closed fracture of right proximal humerus   Subarachnoid hemorrhage following injury, no loss of consciousness ([place])   Trauma   Failure to thrive in adult   Hospice care  Allergies as of 07/06/2021       Reactions   Penicillins Other (See Comments)        Medication List     STOP taking these medications    gabapentin 300 MG  capsule Commonly known as: NEURONTIN   predniSONE 5 MG tablet Commonly known as: DELTASONE       TAKE these medications    morphine 20 MG/5ML solution Take 0.6 mLs (2.4 mg total) by mouth every 2 (two) hours as needed for pain.        Allergies  Allergen Reactions   Penicillins Other (See Comments)    Consultations: None   Procedures/Studies: DG Elbow Complete Right  Result Date: 07/03/2021 CLINICAL DATA:  Fall EXAM: RIGHT ELBOW - COMPLETE 3+ VIEW COMPARISON:  None. FINDINGS: There is no evidence of fracture, dislocation, or joint effusion. There is no evidence of arthropathy or other focal bone abnormality. Soft tissues are unremarkable. IMPRESSION: Negative. Electronically Signed   By: Donavan Foil M.D.   On: 07/03/2021 17:33   CT Head Wo Contrast  Result Date: 07/03/2021 CLINICAL DATA:  Follow-up known subarachnoid hemorrhage, fall, anticoagulation EXAM: CT HEAD WITHOUT CONTRAST TECHNIQUE: Contiguous axial images were obtained from the base of the skull through the vertex without intravenous contrast. RADIATION DOSE REDUCTION: This exam was performed according to the departmental dose-optimization program which includes automated exposure control, adjustment of the mA and/or kV according to patient size and/or use of iterative reconstruction technique. COMPARISON:  07/03/2021 FINDINGS: Brain: No evidence of acute infarction, hydrocephalus, extra-axial collection or mass lesion/mass effect. Unchanged, layering blood product in the dependent bilateral lateral ventricles (series 2, image 15). Extensive periventricular and deep white matter hypodensity as well as areas of encephalomalacia involving the bilateral MCA territories (series 2, image 22, 18). Previously described left temporal subarachnoid hemorrhage is not clearly appreciated on current examination (series 4, image 32). Moderate  global cerebral volume loss. Vascular: No hyperdense vessel or unexpected calcification.  Skull: Normal. Negative for fracture or focal lesion. Sinuses/Orbits: No acute finding. Other: None. IMPRESSION: 1. Unchanged, layering blood product in the dependent bilateral lateral ventricles. 2. Previously described left temporal subarachnoid hemorrhage is not clearly appreciated on current examination. 3. Advanced small-vessel white matter disease and multifocal encephalomalacia, unchanged. Electronically Signed   By: Delanna Ahmadi M.D.   On: 07/03/2021 20:45   CT Head Wo Contrast  Result Date: 07/03/2021 CLINICAL DATA:  Trauma mechanical fall EXAM: CT HEAD WITHOUT CONTRAST CT CERVICAL SPINE WITHOUT CONTRAST TECHNIQUE: Multidetector CT imaging of the head and cervical spine was performed following the standard protocol without intravenous contrast. Multiplanar CT image reconstructions of the cervical spine were also generated. RADIATION DOSE REDUCTION: This exam was performed according to the departmental dose-optimization program which includes automated exposure control, adjustment of the mA and/or kV according to patient size and/or use of iterative reconstruction technique. COMPARISON:  None. FINDINGS: CT HEAD FINDINGS Brain: No acute territorial infarction or intracranial mass is visualized. Small hematocrit levels in the posterior horns of the lateral ventricles consistent with acute small amount of intraventricular hemorrhage. No definite parenchymal hemorrhage is seen. Trace subarachnoid hemorrhage at the left temporal lobe, coronal series 4 image 36. Advanced atrophy. Extensive white matter hypodensity consistent with chronic small vessel ischemic change. Multifocal encephalomalacia involving left parietal and posterior temporal lobes. Encephalomalacia within the left occipital and right inferior frontal lobes. Small focus of encephalomalacia within the left inferior frontal lobe. Ventricular enlargement, probably due to atrophy. Suspect small chronic infarcts in the right thalamus and left  cerebellum. Vascular: No hyperdense vessels.  Carotid vascular calcification Skull: No fracture Sinuses/Orbits: Partial opacification of left ethmoid air cells Other: Negative CT CERVICAL SPINE FINDINGS Alignment: 3 mm anterolisthesis C4 on C5, trace retrolisthesis C5 on C6 and trace anterolisthesis C7 on T1. Facet alignment within normal limits Skull base and vertebrae: No acute fracture. No primary bone lesion or focal pathologic process. Soft tissues and spinal canal: No prevertebral fluid or swelling. No visible canal hematoma. Disc levels: Multilevel degenerative change with advanced disc space narrowing C5-C6 and C6-C7. Facet degenerative changes at multiple levels with foraminal stenosis Upper chest: Apical fibrosis Other: None IMPRESSION: 1. Small amount of bilateral intraventricular blood with trace amount of subarachnoid blood at the left temporal lobe. No significant mass effect or midline shift. 2. Advanced atrophy and extensive chronic small vessel ischemic changes of the white matter. Multifocal chronic bilateral infarcts. 3. Degenerative changes of the cervical spine.  No fracture is seen Critical Value/emergent results were called by telephone at the time of interpretation on 07/03/2021 at 5:06 pm to provider [facility] , who verbally acknowledged these results. Electronically Signed   By: Donavan Foil M.D.   On: 07/03/2021 17:06   CT Cervical Spine Wo Contrast  Result Date: 07/03/2021 CLINICAL DATA:  Trauma mechanical fall EXAM: CT HEAD WITHOUT CONTRAST CT CERVICAL SPINE WITHOUT CONTRAST TECHNIQUE: Multidetector CT imaging of the head and cervical spine was performed following the standard protocol without intravenous contrast. Multiplanar CT image reconstructions of the cervical spine were also generated. RADIATION DOSE REDUCTION: This exam was performed according to the departmental dose-optimization program which includes automated exposure control, adjustment of the mA and/or kV  according to patient size and/or use of iterative reconstruction technique. COMPARISON:  None. FINDINGS: CT HEAD FINDINGS Brain: No acute territorial infarction or intracranial mass is visualized. Small hematocrit levels in the posterior horns of  the lateral ventricles consistent with acute small amount of intraventricular hemorrhage. No definite parenchymal hemorrhage is seen. Trace subarachnoid hemorrhage at the left temporal lobe, coronal series 4 image 36. Advanced atrophy. Extensive white matter hypodensity consistent with chronic small vessel ischemic change. Multifocal encephalomalacia involving left parietal and posterior temporal lobes. Encephalomalacia within the left occipital and right inferior frontal lobes. Small focus of encephalomalacia within the left inferior frontal lobe. Ventricular enlargement, probably due to atrophy. Suspect small chronic infarcts in the right thalamus and left cerebellum. Vascular: No hyperdense vessels.  Carotid vascular calcification Skull: No fracture Sinuses/Orbits: Partial opacification of left ethmoid air cells Other: Negative CT CERVICAL SPINE FINDINGS Alignment: 3 mm anterolisthesis C4 on C5, trace retrolisthesis C5 on C6 and trace anterolisthesis C7 on T1. Facet alignment within normal limits Skull base and vertebrae: No acute fracture. No primary bone lesion or focal pathologic process. Soft tissues and spinal canal: No prevertebral fluid or swelling. No visible canal hematoma. Disc levels: Multilevel degenerative change with advanced disc space narrowing C5-C6 and C6-C7. Facet degenerative changes at multiple levels with foraminal stenosis Upper chest: Apical fibrosis Other: None IMPRESSION: 1. Small amount of bilateral intraventricular blood with trace amount of subarachnoid blood at the left temporal lobe. No significant mass effect or midline shift. 2. Advanced atrophy and extensive chronic small vessel ischemic changes of the white matter. Multifocal chronic  bilateral infarcts. 3. Degenerative changes of the cervical spine.  No fracture is seen Critical Value/emergent results were called by telephone at the time of interpretation on 07/03/2021 at 5:06 pm to provider [facility] , who verbally acknowledged these results. Electronically Signed   By: Donavan Foil M.D.   On: 07/03/2021 17:06   DG Chest Portable 1 View  Result Date: 07/03/2021 CLINICAL DATA:  Fall EXAM: PORTABLE CHEST 1 VIEW COMPARISON:  None. FINDINGS: Acute mildly displaced proximal humerus fracture on the right. Post sternotomy changes. Mild cardiomegaly. No acute airspace disease, pleural effusion or pneumothorax. Biapical pleuroparenchymal fibrosis. Aortic atherosclerosis. IMPRESSION: 1. No acute airspace disease. Mild cardiomegaly and apical fibrosis and scarring 2. Acute displaced right proximal humerus fracture Electronically Signed   By: Donavan Foil M.D.   On: 07/03/2021 17:34   DG Shoulder Right Port  Result Date: 07/03/2021 CLINICAL DATA:  Fall EXAM: RIGHT SHOULDER - 1 VIEW COMPARISON:  None. FINDINGS: AC joint is intact. Acute fracture involving the proximal shaft of the humerus with close to 1/2 shaft diameter midline displacement of distal fracture fragment. There is mild apex anterior angulation at the site of fracture. IMPRESSION: Acute displaced and mildly angulated fracture involving proximal shaft of the humerus Electronically Signed   By: Donavan Foil M.D.   On: 07/03/2021 17:32   ECHOCARDIOGRAM COMPLETE  Result Date: 07/05/2021    ECHOCARDIOGRAM REPORT   Patient Name:   Yolanda Turner Date of Exam: 07/04/2021 Medical Rec #:  [id number]     Height:       65.0 in Accession #:    [id number]    Weight:       67.5 lb Date of Birth:  1925-09-30     BSA:          1.246 m Patient Age:    65 years      BP:           149/80 mmHg Patient Gender: F             HR:           64 bpm. Exam  Location:  [facility] Procedure: 2D Echo, Cardiac Doppler and Color Doppler Indications:     CHF-Acute  Systolic S56.81  History:         Patient has no prior history of Echocardiogram examinations.                  Risk Factors:Hypertension. HLD.  Sonographer:     Alyse Low Roar Referring Phys:  [place] Diagnosing Phys: Serafina Royals MD IMPRESSIONS  1. Apical thrombus seen     . Left ventricular ejection fraction, by estimation, is <20%. The left ventricle has severely decreased function. The left ventricle demonstrates regional wall motion abnormalities (see scoring diagram/findings for description). The left ventricular internal cavity size was moderately dilated. Left ventricular diastolic function could not be evaluated.  2. Right ventricular systolic function is normal. The right ventricular size is normal.  3. Left atrial size was mildly dilated.  4. The mitral valve is normal in structure. Mild mitral valve regurgitation.  5. The aortic valve is normal in structure. Aortic valve regurgitation is trivial. FINDINGS  Left Ventricle: Apical thrombus seen. Left ventricular ejection fraction, by estimation, is <20%. The left ventricle has severely decreased function. The left ventricle demonstrates regional wall motion abnormalities. Severe akinesis of the left ventricular, mid-apical inferior segment, anterior segment, apical segment, inferolateral wall, anterolateral wall, lateral wall, inferior wall, anterior wall, inferoseptal wall, anteroseptal wall and septal wall. The left ventricular internal cavity size was moderately dilated. There is no left  ventricular hypertrophy. Left ventricular diastolic function could not be evaluated. Right Ventricle: The right ventricular size is normal. No increase in right ventricular wall thickness. Right ventricular systolic function is normal. Left Atrium: Left atrial size was mildly dilated. Right Atrium: Right atrial size was normal in size. Pericardium: There is no evidence of pericardial effusion. Mitral Valve: The mitral valve is normal in  structure. Mild mitral valve regurgitation. Tricuspid Valve: The tricuspid valve is normal in structure. Tricuspid valve regurgitation is mild. Aortic Valve: The aortic valve is normal in structure. Aortic valve regurgitation is trivial. Aortic valve peak gradient measures 6.2 mmHg. Pulmonic Valve: The pulmonic valve was normal in structure. Pulmonic valve regurgitation is not visualized. Aorta: The aortic root and ascending aorta are structurally normal, with no evidence of dilitation. IAS/Shunts: No atrial level shunt detected by color flow Doppler.  LEFT VENTRICLE PLAX 2D LVIDd:         3.70 cm LVIDs:         3.20 cm LV PW:         1.20 cm LV IVS:        1.50 cm LVOT diam:     1.70 cm LVOT Area:     2.27 cm  LEFT ATRIUM         Index LA diam:    2.60 cm 2.09 cm/m  AORTIC VALVE                 PULMONIC VALVE AV Area (Vmax): 1.63 cm     PV Vmax:        1.30 m/s AV Vmax:        125.00 cm/s  PV Peak grad:   6.8 mmHg AV Peak Grad:   6.2 mmHg     RVOT Peak grad: 1 mmHg LVOT Vmax:      90.00 cm/s  AORTA Ao Asc diam: 2.20 cm MITRAL VALVE MV Area (PHT): 5.79 cm     SHUNTS MV Decel Time: 131 msec  Systemic Diam: 1.70 cm MV E velocity: 102.00 cm/s Serafina Royals MD Electronically signed by Serafina Royals MD Signature Date/Time: 07/05/2021/9:00:41 AM    Final     Subjective: Patient opens eyes when speaking to her. Mouths that she requests water. When asked if she is in pain, she indicates that she is not with a head shake. I offered patient water with mouth sponge.  Discharge Exam: Vitals:   07/06/21 0524 07/06/21 0805  BP: (!) 178/98 (!) 174/93  Pulse: 99 99  Resp: 18 19  Temp: 97.9 F (36.6 C) 97.9 F (36.6 C)  SpO2: 97% 97%    General: Pt is alert, awake, not in acute distress. Severely cachectic.  Cardiovascular: RRR, quick cap refill Respiratory: normal respiratory effort Abdominal: scaphoid  Extremities: little to no adipose tissue or muscle tone anywhere.   Labs: Basic Metabolic  Panel: Recent Labs  Lab 07/03/21 1945 07/04/21 0426  NA 136 138  K 4.3 4.4  CL 100 99  CO2 28 28  GLUCOSE 251* 178*  BUN 25* 25*  CREATININE 0.62 0.71  CALCIUM 9.2 9.6  MG 1.9 2.0   CBC: Recent Labs  Lab 07/03/21 1709 07/04/21 0426  WBC 8.0 9.5  NEUTROABS 7.4 8.7*  HGB 14.4 12.6  HCT 43.7 39.3  MCV 88.6 91.0  PLT 208 201    Microbiology Recent Results (from the past 240 hour(s))  Resp Panel by RT-PCR (Flu A&B, Covid) Nasopharyngeal Swab     Status: None   Collection Time: 07/03/21  4:35 PM   Specimen: Nasopharyngeal Swab; Nasopharyngeal(NP) swabs in vial transport medium  Result Value Ref Range Status   SARS Coronavirus 2 by RT PCR NEGATIVE NEGATIVE Final    Comment: (NOTE) SARS-CoV-2 target nucleic acids are NOT DETECTED.  The SARS-CoV-2 RNA is generally detectable in upper respiratory specimens during the acute phase of infection. The lowest concentration of SARS-CoV-2 viral copies this assay can detect is 138 copies/mL. A negative result does not preclude SARS-Cov-2 infection and should not be used as the sole basis for treatment or other patient management decisions. A negative result may occur with  improper specimen collection/handling, submission of specimen other than nasopharyngeal swab, presence of viral mutation(s) within the areas targeted by this assay, and inadequate number of viral copies(<138 copies/mL). A negative result must be combined with clinical observations, patient history, and epidemiological information. The expected result is Negative.  Fact Sheet for Patients:  EntrepreneurPulse.com.au  Fact Sheet for Healthcare Providers:  IncredibleEmployment.be  This test is no t yet approved or cleared by the [place] FDA and  has been authorized for detection and/or diagnosis of SARS-CoV-2 by FDA under an Emergency Use Authorization (EUA). This EUA will remain  in effect (meaning this test can be  used) for the duration of the COVID-19 declaration under Section 564(b)(1) of the Act, 21 U.S.C.section 360bbb-3(b)(1), unless the authorization is terminated  or revoked sooner.       Influenza A by PCR NEGATIVE NEGATIVE Final   Influenza B by PCR NEGATIVE NEGATIVE Final    Comment: (NOTE) The Xpert Xpress SARS-CoV-2/FLU/RSV plus assay is intended as an aid in the diagnosis of influenza from Nasopharyngeal swab specimens and should not be used as a sole basis for treatment. Nasal washings and aspirates are unacceptable for Xpert Xpress SARS-CoV-2/FLU/RSV testing.  Fact Sheet for Patients: EntrepreneurPulse.com.au  Fact Sheet for Healthcare Providers: IncredibleEmployment.be  This test is not yet approved or cleared by the [place] and has been authorized  for detection and/or diagnosis of SARS-CoV-2 by FDA under an Emergency Use Authorization (EUA). This EUA will remain in effect (meaning this test can be used) for the duration of the COVID-19 declaration under Section 564(b)(1) of the Act, 21 U.S.C. section 360bbb-3(b)(1), unless the authorization is terminated or revoked.  Performed at [facility], [address] [place], [place] [address]   CULTURE, BLOOD (ROUTINE X 2) w Reflex to ID Panel     Status: None (Preliminary result)   Collection Time: 07/03/21  7:45 PM   Specimen: BLOOD  Result Value Ref Range Status   Specimen Description BLOOD RIGHT ASSIST CONTROL  Final   Special Requests   Final    BOTTLES DRAWN AEROBIC AND ANAEROBIC Blood Culture adequate volume   Culture   Final    NO GROWTH 3 DAYS Performed at [facility] [address], [place], [place] [address]    Report Status PENDING  Incomplete  CULTURE, BLOOD (ROUTINE X 2) w Reflex to ID Panel     Status: None (Preliminary result)   Collection Time: 07/03/21  7:45 PM   Specimen: BLOOD  Result Value Ref Range Status   Specimen Description  BLOOD LEFT ASSIST CONTROL  Final   Special Requests   Final    BOTTLES DRAWN AEROBIC AND ANAEROBIC Blood Culture adequate volume   Culture   Final    NO GROWTH 3 DAYS Performed at [facility], [address] [place], [place] [address]    Report Status PENDING  Incomplete  MRSA Next Gen by PCR, Nasal     Status: None   Collection Time: 07/03/21  8:53 PM   Specimen: Nasal Mucosa; Nasal Swab  Result Value Ref Range Status   MRSA by PCR Next Gen NOT DETECTED NOT DETECTED Final    Comment: (NOTE) The GeneXpert MRSA Assay (FDA approved for NASAL specimens only), is one component of a comprehensive MRSA colonization surveillance program. It is not intended to diagnose MRSA infection nor to guide or monitor treatment for MRSA infections. Test performance is not FDA approved in patients less than 24 years old. Performed at [facility], [address], [place], [place] [address]     Time coordinating discharge: Over 30 minutes  Richarda Osmond, MD  [facility] 07/06/2021, 10:03 AM

## 2021-07-06 NOTE — TOC Progression Note (Signed)
Transition of Care [facility]) - Progression Note    Patient Details  Name: Yolanda Turner MRN: [id number] Date of Birth: July 31, 1925  Transition of Care [facility]) CM/SW [place], RN Phone Number: 07/06/2021, 10:54 AM  Clinical Narrative: Patient discharging home with [facility] today.  Sonia Baller from [place] aware, arranging transport           Expected Discharge Plan and Services           Expected Discharge Date: 07/06/21                                     Social Determinants of Health (SDOH) Interventions    Readmission Risk Interventions No flowsheet data found.

## 2021-07-06 NOTE — Care Management Important Message (Signed)
Important Message  Patient Details  Name: Yolanda Turner MRN: [id number] Date of Birth: 06/27/1925   Medicare Important Message Given:  Other (see comment)  Patient is discharging home with Hospice. Out of respect for the patient and family no Important Message from [facility] given.   Juliann Pulse A Barnell Shieh 07/06/2021, 8:23 AM

## 2021-07-08 LAB — CULTURE, BLOOD (ROUTINE X 2)
Culture: NO GROWTH
Culture: NO GROWTH
Special Requests: ADEQUATE
Special Requests: ADEQUATE

## 2021-07-14 DEATH — deceased
